# Patient Record
Sex: Female | Born: 1950 | Race: White | Hispanic: No | Marital: Married | Smoking: Never smoker
Health system: Southern US, Community
[De-identification: ages and names within clinical notes are randomized; demographics above are authoritative.]

## PROBLEM LIST (undated history)

## (undated) DIAGNOSIS — Z780 Asymptomatic menopausal state: Secondary | ICD-10-CM

## (undated) DIAGNOSIS — I10 Essential (primary) hypertension: Secondary | ICD-10-CM

## (undated) DIAGNOSIS — Z923 Personal history of irradiation: Secondary | ICD-10-CM

## (undated) DIAGNOSIS — I890 Lymphedema, not elsewhere classified: Secondary | ICD-10-CM

## (undated) DIAGNOSIS — R0600 Dyspnea, unspecified: Secondary | ICD-10-CM

## (undated) DIAGNOSIS — D6851 Activated protein C resistance: Secondary | ICD-10-CM

## (undated) DIAGNOSIS — R519 Headache, unspecified: Secondary | ICD-10-CM

## (undated) DIAGNOSIS — C50919 Malignant neoplasm of unspecified site of unspecified female breast: Secondary | ICD-10-CM

## (undated) DIAGNOSIS — H269 Unspecified cataract: Secondary | ICD-10-CM

## (undated) HISTORY — DX: Unspecified cataract: H26.9

## (undated) HISTORY — DX: Activated protein C resistance: D68.51

## (undated) HISTORY — DX: Essential (primary) hypertension: I10

## (undated) HISTORY — DX: Asymptomatic menopausal state: Z78.0

## (undated) HISTORY — DX: Malignant neoplasm of unspecified site of unspecified female breast: C50.919

---

## 1992-12-27 DIAGNOSIS — C50919 Malignant neoplasm of unspecified site of unspecified female breast: Secondary | ICD-10-CM

## 1992-12-27 HISTORY — DX: Malignant neoplasm of unspecified site of unspecified female breast: C50.919

## 1992-12-27 HISTORY — PX: BREAST LUMPECTOMY: SHX2

## 1998-11-06 ENCOUNTER — Other Ambulatory Visit: Admission: RE | Admit: 1998-11-06 | Discharge: 1998-11-06 | Payer: Self-pay | Admitting: *Deleted

## 1999-01-28 ENCOUNTER — Other Ambulatory Visit: Admission: RE | Admit: 1999-01-28 | Discharge: 1999-01-28 | Payer: Self-pay | Admitting: *Deleted

## 2000-07-12 ENCOUNTER — Other Ambulatory Visit: Admission: RE | Admit: 2000-07-12 | Discharge: 2000-07-12 | Payer: Self-pay | Admitting: *Deleted

## 2000-12-23 ENCOUNTER — Ambulatory Visit (HOSPITAL_COMMUNITY): Admission: RE | Admit: 2000-12-23 | Discharge: 2000-12-23 | Payer: Self-pay | Admitting: Surgery

## 2002-05-10 ENCOUNTER — Encounter: Payer: Self-pay | Admitting: Emergency Medicine

## 2002-05-10 ENCOUNTER — Emergency Department (HOSPITAL_COMMUNITY): Admission: EM | Admit: 2002-05-10 | Discharge: 2002-05-10 | Payer: Self-pay | Admitting: Emergency Medicine

## 2002-06-20 ENCOUNTER — Encounter (INDEPENDENT_AMBULATORY_CARE_PROVIDER_SITE_OTHER): Payer: Self-pay | Admitting: Specialist

## 2002-06-20 ENCOUNTER — Observation Stay (HOSPITAL_COMMUNITY): Admission: RE | Admit: 2002-06-20 | Discharge: 2002-06-20 | Payer: Self-pay | Admitting: Surgery

## 2002-06-20 ENCOUNTER — Encounter: Payer: Self-pay | Admitting: Surgery

## 2002-06-20 HISTORY — PX: CHOLECYSTECTOMY: SHX55

## 2004-08-10 ENCOUNTER — Encounter: Admission: RE | Admit: 2004-08-10 | Discharge: 2004-08-10 | Payer: Self-pay | Admitting: Family Medicine

## 2004-12-12 ENCOUNTER — Ambulatory Visit: Payer: Self-pay | Admitting: Family Medicine

## 2005-08-06 ENCOUNTER — Ambulatory Visit: Payer: Self-pay | Admitting: Family Medicine

## 2005-08-08 IMAGING — CR DG CHEST 2V
2 series · 2 of 2 positions shown · non-contrast
Comparison: none

CLINICAL DATA: Breast cancer. 
 TWO VIEW CHEST 
 Heart size and mediastinal contours are within normal limits.  Both lungs are clear.  Surgical clips are seen in the right axilla from previous axillary lymph node dissection.  Mild thoracic dextroscoliosis is also noted. 
 IMPRESSION
 No active cardiopulmonary disease.

[view not recorded (1 of 2)]
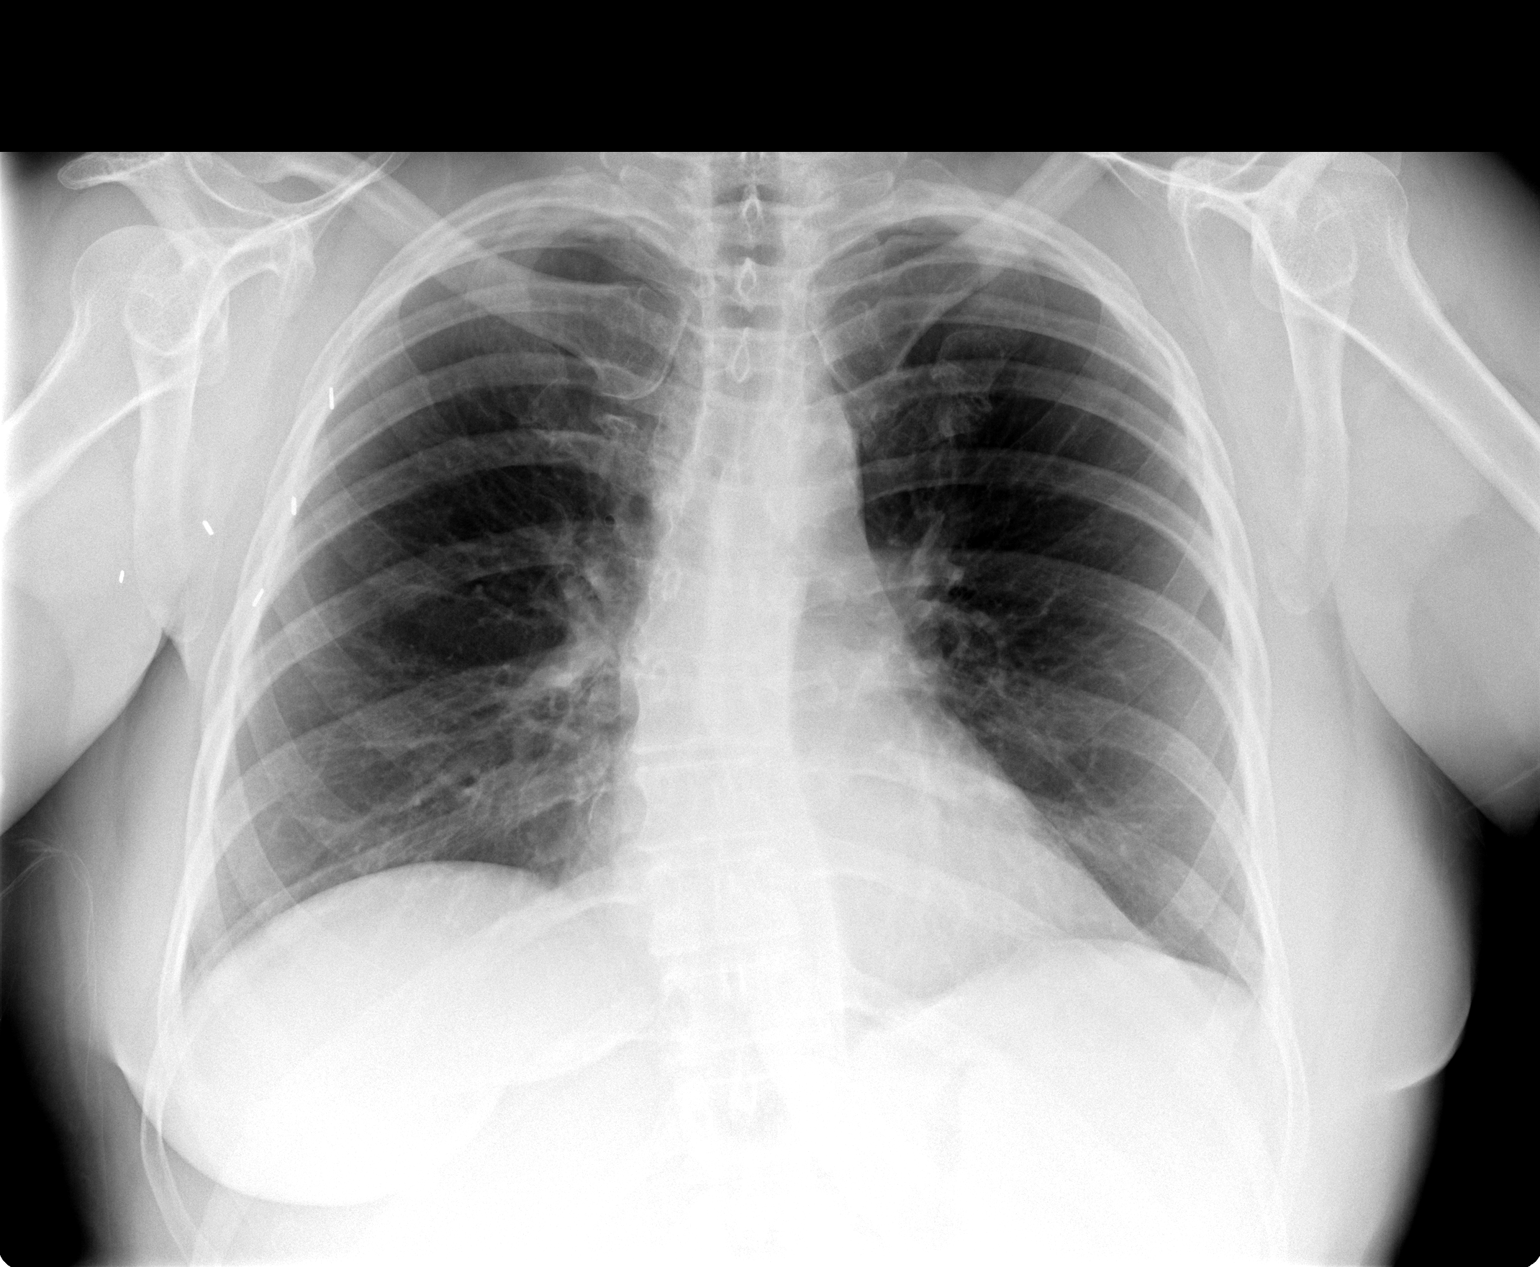

[view not recorded (2 of 2)]
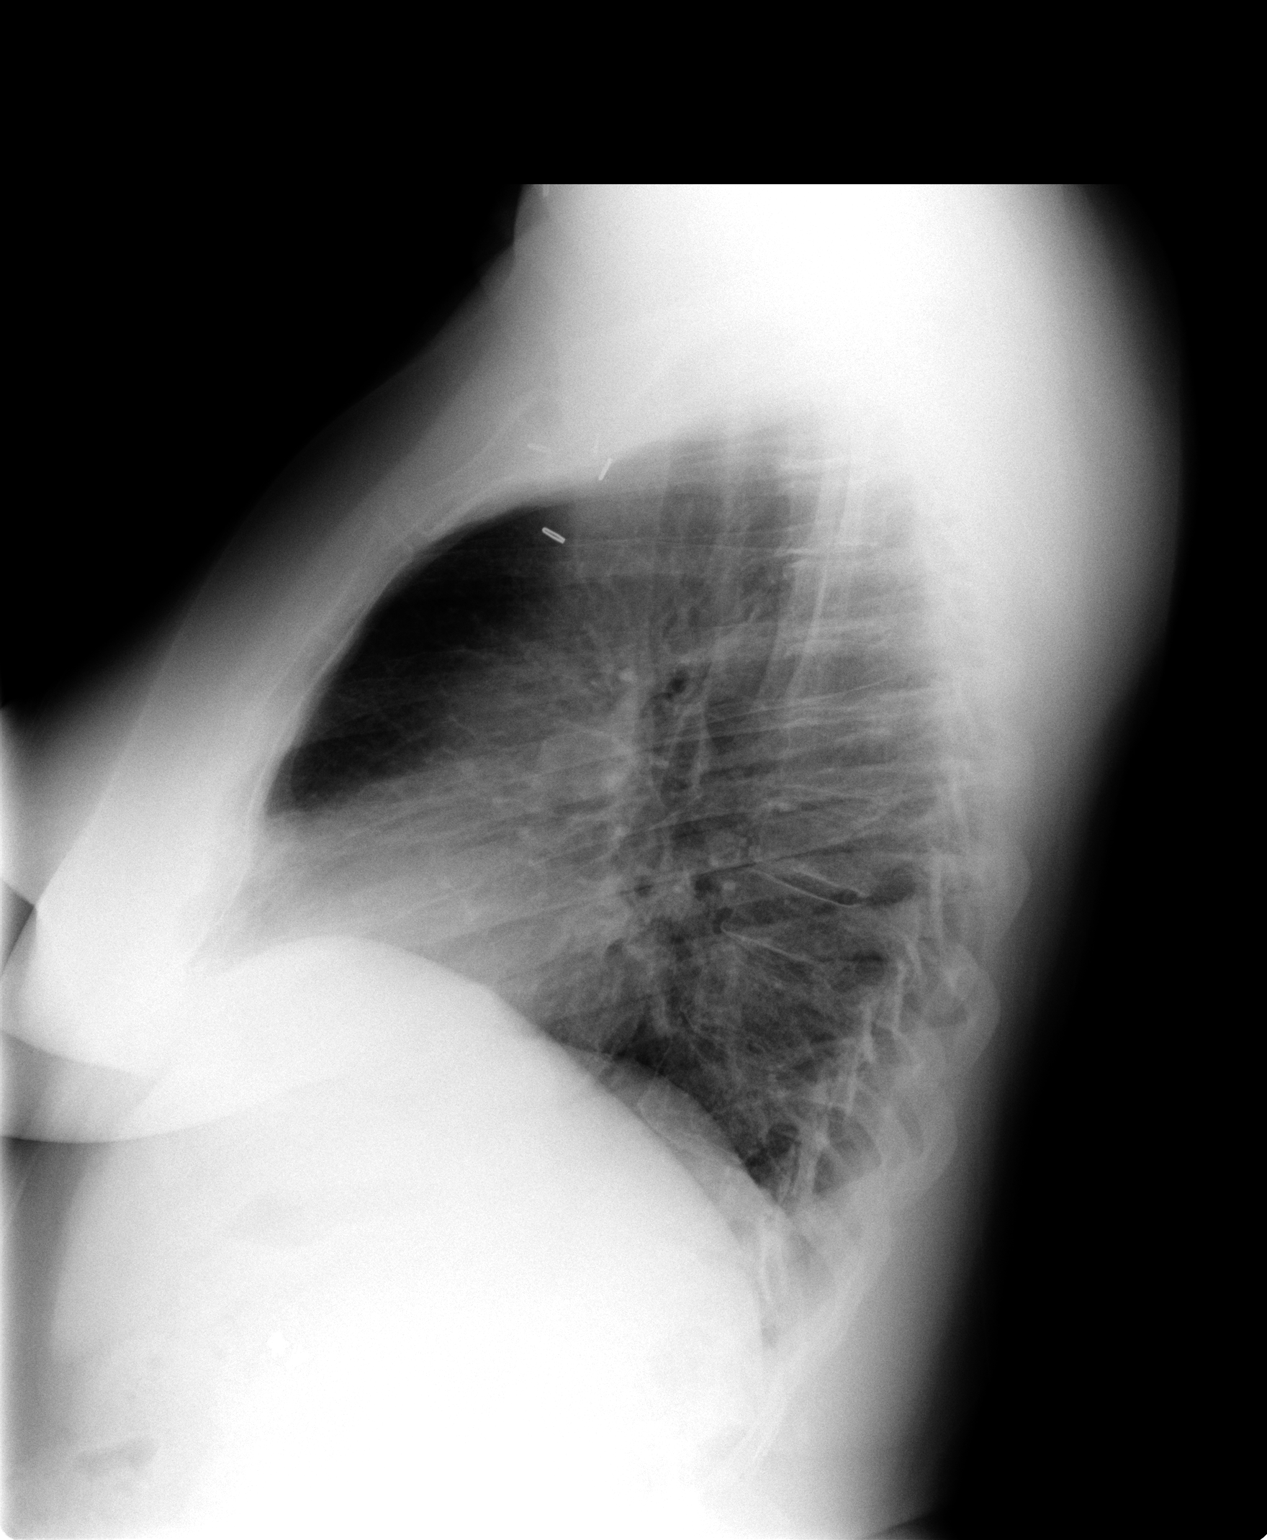

[2 of 2 positions shown; findings below may reference images not displayed]

## 2005-08-26 ENCOUNTER — Other Ambulatory Visit: Admission: RE | Admit: 2005-08-26 | Discharge: 2005-08-26 | Payer: Self-pay | Admitting: Family Medicine

## 2005-08-26 ENCOUNTER — Ambulatory Visit: Payer: Self-pay | Admitting: Family Medicine

## 2005-09-15 ENCOUNTER — Ambulatory Visit: Payer: Self-pay | Admitting: Family Medicine

## 2006-08-17 ENCOUNTER — Other Ambulatory Visit: Admission: RE | Admit: 2006-08-17 | Discharge: 2006-08-17 | Payer: Self-pay | Admitting: Family Medicine

## 2006-08-17 ENCOUNTER — Encounter: Payer: Self-pay | Admitting: Family Medicine

## 2006-08-17 ENCOUNTER — Ambulatory Visit: Payer: Self-pay | Admitting: Family Medicine

## 2007-02-02 ENCOUNTER — Ambulatory Visit (HOSPITAL_COMMUNITY): Admission: RE | Admit: 2007-02-02 | Discharge: 2007-02-02 | Payer: Self-pay | Admitting: Surgery

## 2007-09-14 ENCOUNTER — Other Ambulatory Visit: Admission: RE | Admit: 2007-09-14 | Discharge: 2007-09-14 | Payer: Self-pay | Admitting: Family Medicine

## 2007-09-14 ENCOUNTER — Encounter: Payer: Self-pay | Admitting: Family Medicine

## 2007-09-14 ENCOUNTER — Ambulatory Visit: Payer: Self-pay | Admitting: Family Medicine

## 2007-09-14 DIAGNOSIS — I10 Essential (primary) hypertension: Secondary | ICD-10-CM | POA: Insufficient documentation

## 2007-09-14 DIAGNOSIS — N951 Menopausal and female climacteric states: Secondary | ICD-10-CM

## 2007-09-14 DIAGNOSIS — Z853 Personal history of malignant neoplasm of breast: Secondary | ICD-10-CM | POA: Insufficient documentation

## 2007-09-14 DIAGNOSIS — R300 Dysuria: Secondary | ICD-10-CM | POA: Insufficient documentation

## 2007-09-15 ENCOUNTER — Encounter: Payer: Self-pay | Admitting: Family Medicine

## 2007-09-19 ENCOUNTER — Encounter (INDEPENDENT_AMBULATORY_CARE_PROVIDER_SITE_OTHER): Payer: Self-pay | Admitting: *Deleted

## 2007-11-01 ENCOUNTER — Telehealth (INDEPENDENT_AMBULATORY_CARE_PROVIDER_SITE_OTHER): Payer: Self-pay | Admitting: *Deleted

## 2007-12-11 ENCOUNTER — Encounter: Payer: Self-pay | Admitting: Family Medicine

## 2007-12-22 ENCOUNTER — Encounter: Payer: Self-pay | Admitting: Family Medicine

## 2008-02-02 ENCOUNTER — Encounter: Payer: Self-pay | Admitting: Family Medicine

## 2008-03-12 ENCOUNTER — Ambulatory Visit: Payer: Self-pay | Admitting: Family Medicine

## 2008-03-24 LAB — CONVERTED CEMR LAB
ALT: 28 units/L (ref 0–35)
AST: 27 units/L (ref 0–37)
Albumin: 3.6 g/dL (ref 3.5–5.2)
Alkaline Phosphatase: 57 units/L (ref 39–117)
Bilirubin, Direct: 0.1 mg/dL (ref 0.0–0.3)
Cholesterol: 183 mg/dL (ref 0–200)
HDL: 44.5 mg/dL (ref >39.0)
LDL Cholesterol: 119 mg/dL — ABNORMAL HIGH (ref 0–99)
Total Bilirubin: 0.6 mg/dL (ref 0.3–1.2)
Total CHOL/HDL Ratio: 4.1
Total Protein: 7 g/dL (ref 6.0–8.3)
Triglycerides: 99 mg/dL (ref 0–149)
VLDL: 20 mg/dL (ref 0–40)

## 2008-03-25 ENCOUNTER — Encounter (INDEPENDENT_AMBULATORY_CARE_PROVIDER_SITE_OTHER): Payer: Self-pay | Admitting: *Deleted

## 2008-05-02 ENCOUNTER — Ambulatory Visit: Payer: Self-pay | Admitting: Internal Medicine

## 2008-05-02 ENCOUNTER — Inpatient Hospital Stay (HOSPITAL_COMMUNITY): Admission: AD | Admit: 2008-05-02 | Discharge: 2008-05-04 | Payer: Self-pay | Admitting: Internal Medicine

## 2008-05-02 ENCOUNTER — Encounter: Payer: Self-pay | Admitting: Internal Medicine

## 2008-05-02 DIAGNOSIS — I890 Lymphedema, not elsewhere classified: Secondary | ICD-10-CM | POA: Insufficient documentation

## 2008-05-02 DIAGNOSIS — IMO0002 Reserved for concepts with insufficient information to code with codable children: Secondary | ICD-10-CM

## 2008-05-17 ENCOUNTER — Ambulatory Visit: Payer: Self-pay | Admitting: Internal Medicine

## 2008-05-17 DIAGNOSIS — E876 Hypokalemia: Secondary | ICD-10-CM

## 2008-05-21 ENCOUNTER — Ambulatory Visit: Payer: Self-pay | Admitting: Internal Medicine

## 2008-05-22 LAB — CONVERTED CEMR LAB
BUN: 9 mg/dL (ref 6–23)
Creatinine, Ser: 0.8 mg/dL (ref 0.4–1.2)
Potassium: 3.2 meq/L — ABNORMAL LOW (ref 3.5–5.1)

## 2008-05-23 ENCOUNTER — Encounter (INDEPENDENT_AMBULATORY_CARE_PROVIDER_SITE_OTHER): Payer: Self-pay | Admitting: *Deleted

## 2008-09-17 ENCOUNTER — Telehealth (INDEPENDENT_AMBULATORY_CARE_PROVIDER_SITE_OTHER): Payer: Self-pay | Admitting: *Deleted

## 2008-10-16 ENCOUNTER — Telehealth (INDEPENDENT_AMBULATORY_CARE_PROVIDER_SITE_OTHER): Payer: Self-pay | Admitting: *Deleted

## 2008-10-16 ENCOUNTER — Emergency Department (HOSPITAL_COMMUNITY): Admission: EM | Admit: 2008-10-16 | Discharge: 2008-10-16 | Payer: Self-pay | Admitting: Emergency Medicine

## 2008-10-17 ENCOUNTER — Encounter: Payer: Self-pay | Admitting: Family Medicine

## 2008-10-18 ENCOUNTER — Ambulatory Visit: Payer: Self-pay | Admitting: Family Medicine

## 2008-10-18 DIAGNOSIS — H8110 Benign paroxysmal vertigo, unspecified ear: Secondary | ICD-10-CM | POA: Insufficient documentation

## 2008-11-01 ENCOUNTER — Ambulatory Visit: Payer: Self-pay | Admitting: Family Medicine

## 2008-11-01 DIAGNOSIS — J019 Acute sinusitis, unspecified: Secondary | ICD-10-CM | POA: Insufficient documentation

## 2008-11-02 LAB — CONVERTED CEMR LAB
BUN: 7 mg/dL (ref 6–23)
Calcium: 9 mg/dL (ref 8.4–10.5)
GFR calc Af Amer: 95 mL/min
Glucose, Bld: 85 mg/dL (ref 70–99)

## 2008-11-04 ENCOUNTER — Encounter (INDEPENDENT_AMBULATORY_CARE_PROVIDER_SITE_OTHER): Payer: Self-pay | Admitting: *Deleted

## 2008-12-13 ENCOUNTER — Encounter: Payer: Self-pay | Admitting: Family Medicine

## 2008-12-26 ENCOUNTER — Other Ambulatory Visit: Admission: RE | Admit: 2008-12-26 | Discharge: 2008-12-26 | Payer: Self-pay | Admitting: Family Medicine

## 2008-12-26 ENCOUNTER — Encounter: Payer: Self-pay | Admitting: Family Medicine

## 2008-12-26 ENCOUNTER — Ambulatory Visit: Payer: Self-pay | Admitting: Family Medicine

## 2009-01-03 ENCOUNTER — Encounter (INDEPENDENT_AMBULATORY_CARE_PROVIDER_SITE_OTHER): Payer: Self-pay | Admitting: *Deleted

## 2009-01-06 ENCOUNTER — Encounter (INDEPENDENT_AMBULATORY_CARE_PROVIDER_SITE_OTHER): Payer: Self-pay | Admitting: *Deleted

## 2009-01-09 ENCOUNTER — Ambulatory Visit: Payer: Self-pay | Admitting: Family Medicine

## 2009-01-10 ENCOUNTER — Ambulatory Visit: Payer: Self-pay | Admitting: Family Medicine

## 2009-01-10 LAB — CONVERTED CEMR LAB: OCCULT 2: NEGATIVE

## 2009-01-13 ENCOUNTER — Encounter (INDEPENDENT_AMBULATORY_CARE_PROVIDER_SITE_OTHER): Payer: Self-pay | Admitting: *Deleted

## 2009-04-09 ENCOUNTER — Ambulatory Visit: Payer: Self-pay | Admitting: Family Medicine

## 2009-07-16 ENCOUNTER — Telehealth: Payer: Self-pay | Admitting: Family Medicine

## 2009-07-16 ENCOUNTER — Telehealth (INDEPENDENT_AMBULATORY_CARE_PROVIDER_SITE_OTHER): Payer: Self-pay | Admitting: *Deleted

## 2009-07-16 ENCOUNTER — Ambulatory Visit: Payer: Self-pay | Admitting: Family Medicine

## 2009-07-16 DIAGNOSIS — R0609 Other forms of dyspnea: Secondary | ICD-10-CM

## 2009-07-16 DIAGNOSIS — R079 Chest pain, unspecified: Secondary | ICD-10-CM

## 2009-07-16 DIAGNOSIS — R0989 Other specified symptoms and signs involving the circulatory and respiratory systems: Secondary | ICD-10-CM

## 2009-07-21 ENCOUNTER — Encounter (INDEPENDENT_AMBULATORY_CARE_PROVIDER_SITE_OTHER): Payer: Self-pay | Admitting: *Deleted

## 2009-07-22 ENCOUNTER — Telehealth (INDEPENDENT_AMBULATORY_CARE_PROVIDER_SITE_OTHER): Payer: Self-pay | Admitting: *Deleted

## 2009-07-23 ENCOUNTER — Ambulatory Visit: Payer: Self-pay

## 2009-07-23 ENCOUNTER — Encounter: Payer: Self-pay | Admitting: Cardiovascular Disease

## 2009-07-23 ENCOUNTER — Encounter: Payer: Self-pay | Admitting: Family Medicine

## 2009-07-25 ENCOUNTER — Telehealth (INDEPENDENT_AMBULATORY_CARE_PROVIDER_SITE_OTHER): Payer: Self-pay | Admitting: *Deleted

## 2009-07-28 ENCOUNTER — Encounter (INDEPENDENT_AMBULATORY_CARE_PROVIDER_SITE_OTHER): Payer: Self-pay | Admitting: *Deleted

## 2009-11-05 ENCOUNTER — Encounter: Payer: Self-pay | Admitting: Family Medicine

## 2009-12-15 ENCOUNTER — Encounter: Payer: Self-pay | Admitting: Family Medicine

## 2009-12-25 ENCOUNTER — Other Ambulatory Visit: Admission: RE | Admit: 2009-12-25 | Discharge: 2009-12-25 | Payer: Self-pay | Admitting: Family Medicine

## 2009-12-25 ENCOUNTER — Ambulatory Visit: Payer: Self-pay | Admitting: Family Medicine

## 2009-12-29 ENCOUNTER — Encounter (INDEPENDENT_AMBULATORY_CARE_PROVIDER_SITE_OTHER): Payer: Self-pay | Admitting: *Deleted

## 2010-01-15 ENCOUNTER — Ambulatory Visit: Payer: Self-pay | Admitting: Family Medicine

## 2010-01-15 ENCOUNTER — Encounter (INDEPENDENT_AMBULATORY_CARE_PROVIDER_SITE_OTHER): Payer: Self-pay | Admitting: *Deleted

## 2010-01-15 DIAGNOSIS — D235 Other benign neoplasm of skin of trunk: Secondary | ICD-10-CM | POA: Insufficient documentation

## 2010-01-15 DIAGNOSIS — L919 Hypertrophic disorder of the skin, unspecified: Secondary | ICD-10-CM

## 2010-01-15 DIAGNOSIS — L909 Atrophic disorder of skin, unspecified: Secondary | ICD-10-CM | POA: Insufficient documentation

## 2010-01-15 LAB — CONVERTED CEMR LAB
OCCULT 1: NEGATIVE
OCCULT 2: NEGATIVE
OCCULT 3: NEGATIVE

## 2010-01-19 ENCOUNTER — Telehealth (INDEPENDENT_AMBULATORY_CARE_PROVIDER_SITE_OTHER): Payer: Self-pay | Admitting: *Deleted

## 2010-01-22 ENCOUNTER — Ambulatory Visit: Payer: Self-pay | Admitting: Family Medicine

## 2010-07-17 ENCOUNTER — Ambulatory Visit: Payer: Self-pay | Admitting: Family Medicine

## 2010-07-17 DIAGNOSIS — E785 Hyperlipidemia, unspecified: Secondary | ICD-10-CM | POA: Insufficient documentation

## 2010-07-27 LAB — CONVERTED CEMR LAB
ALT: 24 units/L (ref 0–35)
AST: 24 units/L (ref 0–37)
Albumin: 3.7 g/dL (ref 3.5–5.2)
Alkaline Phosphatase: 55 units/L (ref 39–117)
BUN: 11 mg/dL (ref 6–23)
Bilirubin, Direct: 0.1 mg/dL (ref 0.0–0.3)
Calcium: 9 mg/dL (ref 8.4–10.5)
Chloride: 108 meq/L (ref 96–112)
Cholesterol: 204 mg/dL — ABNORMAL HIGH (ref 0–200)
Creatinine, Ser: 0.8 mg/dL (ref 0.4–1.2)
Direct LDL: 141.7 mg/dL
Total Protein: 6.8 g/dL (ref 6.0–8.3)
VLDL: 15.2 mg/dL (ref 0.0–40.0)

## 2010-10-19 ENCOUNTER — Ambulatory Visit: Payer: Self-pay | Admitting: Family Medicine

## 2010-10-21 LAB — CONVERTED CEMR LAB
BUN: 12 mg/dL (ref 6–23)
CO2: 27 meq/L (ref 19–32)
Chloride: 102 meq/L (ref 96–112)
Direct LDL: 135.3 mg/dL
Hgb A1c MFr Bld: 6.2 % (ref 4.6–6.5)
Potassium: 4.1 meq/L (ref 3.5–5.1)
VLDL: 19.4 mg/dL (ref 0.0–40.0)

## 2011-01-04 ENCOUNTER — Encounter: Payer: Self-pay | Admitting: Family Medicine

## 2011-01-24 LAB — CONVERTED CEMR LAB
ALT: 21 units/L (ref 0–35)
ALT: 29 units/L (ref 0–35)
AST: 24 units/L (ref 0–37)
AST: 31 units/L (ref 0–37)
Albumin: 3.7 g/dL (ref 3.5–5.2)
Albumin: 3.8 g/dL (ref 3.5–5.2)
Albumin: 3.8 g/dL (ref 3.5–5.2)
Albumin: 4 g/dL (ref 3.5–5.2)
Alkaline Phosphatase: 56 units/L (ref 39–117)
BUN: 12 mg/dL (ref 6–23)
BUN: 8 mg/dL (ref 6–23)
BUN: 8 mg/dL (ref 6–23)
BUN: 8 mg/dL (ref 6–23)
Basophils Absolute: 0 10*3/uL (ref 0.0–0.1)
Basophils Absolute: 0.1 10*3/uL (ref 0.0–0.1)
Basophils Relative: 0.1 % (ref 0.0–1.0)
Basophils Relative: 0.5 % (ref 0.0–3.0)
Bilirubin Urine: NEGATIVE
Bilirubin Urine: NEGATIVE
Bilirubin, Direct: 0.1 mg/dL (ref 0.0–0.3)
Blood in Urine, dipstick: NEGATIVE
Blood in Urine, dipstick: NEGATIVE
CO2: 27 meq/L (ref 19–32)
CO2: 29 meq/L (ref 19–32)
Calcium: 9 mg/dL (ref 8.4–10.5)
Calcium: 9.1 mg/dL (ref 8.4–10.5)
Calcium: 9.2 mg/dL (ref 8.4–10.5)
Chloride: 104 meq/L (ref 96–112)
Chloride: 104 meq/L (ref 96–112)
Cholesterol: 196 mg/dL (ref 0–200)
Cholesterol: 200 mg/dL (ref 0–200)
Cholesterol: 202 mg/dL (ref 0–200)
Creatinine, Ser: 0.8 mg/dL (ref 0.4–1.2)
Creatinine, Ser: 0.8 mg/dL (ref 0.4–1.2)
Creatinine, Ser: 1 mg/dL (ref 0.4–1.2)
Direct LDL: 145.9 mg/dL
Direct LDL: 146.7 mg/dL
Eosinophils Absolute: 0.1 10*3/uL (ref 0.0–0.6)
Eosinophils Absolute: 0.1 10*3/uL (ref 0.0–0.7)
Eosinophils Absolute: 0.1 10*3/uL (ref 0.0–0.7)
Eosinophils Relative: 1 % (ref 0.0–5.0)
Eosinophils Relative: 1.2 % (ref 0.0–5.0)
GFR calc Af Amer: 74 mL/min
GFR calc non Af Amer: 61 mL/min
GFR calc non Af Amer: 68.52 mL/min (ref >60)
GFR calc non Af Amer: 78.62 mL/min (ref >60)
GFR calc non Af Amer: 79 mL/min
Glucose, Bld: 87 mg/dL (ref 70–99)
Glucose, Bld: 95 mg/dL (ref 70–99)
Glucose, Bld: 97 mg/dL (ref 70–99)
Glucose, Bld: 97 mg/dL (ref 70–99)
Glucose, Urine, Semiquant: NEGATIVE
Glucose, Urine, Semiquant: NEGATIVE
HCT: 39.8 % (ref 36.0–46.0)
HCT: 40.8 % (ref 36.0–46.0)
HCT: 41 % (ref 36.0–46.0)
HDL: 51.6 mg/dL (ref >39.0)
HDL: 54 mg/dL (ref >39.00)
HDL: 55.6 mg/dL (ref >39.00)
Hemoglobin: 13.7 g/dL (ref 12.0–15.0)
Hemoglobin: 13.9 g/dL (ref 12.0–15.0)
Ketones, urine, test strip: NEGATIVE
LDL Cholesterol: 126 mg/dL — ABNORMAL HIGH (ref 0–99)
Lymphocytes Relative: 28.4 % (ref 12.0–46.0)
Lymphs Abs: 2.1 10*3/uL (ref 0.7–4.0)
MCHC: 34.1 g/dL (ref 30.0–36.0)
MCHC: 34.3 g/dL (ref 30.0–36.0)
MCV: 91.7 fL (ref 78.0–100.0)
MCV: 92.9 fL (ref 78.0–100.0)
Monocytes Absolute: 0.5 10*3/uL (ref 0.2–0.7)
Monocytes Absolute: 0.6 10*3/uL (ref 0.1–1.0)
Monocytes Relative: 7.1 % (ref 3.0–11.0)
Monocytes Relative: 8.5 % (ref 3.0–12.0)
Monocytes Relative: 9.2 % (ref 3.0–12.0)
Neutro Abs: 3.7 10*3/uL (ref 1.4–7.7)
Neutro Abs: 4.1 10*3/uL (ref 1.4–7.7)
Neutrophils Relative %: 60 % (ref 43.0–77.0)
Neutrophils Relative %: 63.4 % (ref 43.0–77.0)
Nitrite: NEGATIVE
Pap Smear: NORMAL
Platelets: 285 10*3/uL (ref 150.0–400.0)
Platelets: 300 10*3/uL (ref 150–400)
Potassium: 3.6 meq/L (ref 3.5–5.1)
Potassium: 3.9 meq/L (ref 3.5–5.1)
Protein, U semiquant: NEGATIVE
Protein, U semiquant: NEGATIVE
RBC: 4.34 M/uL (ref 3.87–5.11)
RBC: 4.42 M/uL (ref 3.87–5.11)
RDW: 13.6 % (ref 11.5–14.6)
RDW: 13.9 % (ref 11.5–14.6)
Sodium: 141 meq/L (ref 135–145)
Sodium: 142 meq/L (ref 135–145)
Specific Gravity, Urine: 1.015
Specific Gravity, Urine: >=1.03
TSH: 2.56 microintl units/mL (ref 0.35–5.50)
TSH: 3.2 microintl units/mL (ref 0.35–5.50)
Total Bilirubin: 0.6 mg/dL (ref 0.3–1.2)
Total Bilirubin: 0.6 mg/dL (ref 0.3–1.2)
Total Bilirubin: 0.7 mg/dL (ref 0.3–1.2)
Total CHOL/HDL Ratio: 3.9
Total Protein: 7 g/dL (ref 6.0–8.3)
Total Protein: 7.3 g/dL (ref 6.0–8.3)
Triglycerides: 91 mg/dL (ref 0–149)
Triglycerides: 94 mg/dL (ref 0.0–149.0)
Urobilinogen, UA: NEGATIVE
VLDL: 17.4 mg/dL (ref 0.0–40.0)
VLDL: 18 mg/dL (ref 0–40)
VLDL: 18.8 mg/dL (ref 0.0–40.0)
WBC: 6 10*3/uL (ref 4.5–10.5)
WBC: 6.5 10*3/uL (ref 4.5–10.5)
pH: 5
pH: 6

## 2011-01-26 ENCOUNTER — Encounter: Payer: Self-pay | Admitting: Family Medicine

## 2011-01-26 ENCOUNTER — Other Ambulatory Visit (HOSPITAL_COMMUNITY)
Admission: RE | Admit: 2011-01-26 | Discharge: 2011-01-26 | Disposition: A | Discharge status: Home / Self Care | Payer: BC Managed Care – PPO | Source: Ambulatory Visit | Attending: Family Medicine | Admitting: Family Medicine

## 2011-01-26 ENCOUNTER — Other Ambulatory Visit: Payer: Self-pay | Admitting: Family Medicine

## 2011-01-26 ENCOUNTER — Ambulatory Visit
Admission: RE | Admit: 2011-01-26 | Discharge: 2011-01-26 | Payer: Self-pay | Source: Home / Self Care | Attending: Family Medicine | Admitting: Family Medicine

## 2011-01-26 DIAGNOSIS — Z01419 Encounter for gynecological examination (general) (routine) without abnormal findings: Secondary | ICD-10-CM | POA: Insufficient documentation

## 2011-01-26 LAB — CONVERTED CEMR LAB
Blood in Urine, dipstick: NEGATIVE
Nitrite: NEGATIVE
Protein, U semiquant: NEGATIVE
Urobilinogen, UA: NEGATIVE
WBC Urine, dipstick: NEGATIVE

## 2011-01-26 LAB — CBC WITH DIFFERENTIAL/PLATELET
Basophils Absolute: 0 10*3/uL (ref 0.0–0.1)
Eosinophils Relative: 1.3 % (ref 0.0–5.0)
Hemoglobin: 13.7 g/dL (ref 12.0–15.0)
Lymphocytes Relative: 30.2 % (ref 12.0–46.0)
Monocytes Relative: 7.7 % (ref 3.0–12.0)
Neutro Abs: 3.7 10*3/uL (ref 1.4–7.7)
Platelets: 295 10*3/uL (ref 150.0–400.0)
RDW: 14.6 % (ref 11.5–14.6)
WBC: 6.1 10*3/uL (ref 4.5–10.5)

## 2011-01-26 LAB — LIPID PANEL
HDL: 50.9 mg/dL (ref >39.00)
LDL Cholesterol: 125 mg/dL — ABNORMAL HIGH (ref 0–99)
VLDL: 19 mg/dL (ref 0.0–40.0)

## 2011-01-26 LAB — BASIC METABOLIC PANEL
Calcium: 9.1 mg/dL (ref 8.4–10.5)
GFR: 60.44 mL/min (ref >60.00)
Glucose, Bld: 91 mg/dL (ref 70–99)
Sodium: 141 mEq/L (ref 135–145)

## 2011-01-26 LAB — HEPATIC FUNCTION PANEL
AST: 26 U/L (ref 0–37)
Alkaline Phosphatase: 53 U/L (ref 39–117)
Total Bilirubin: 0.2 mg/dL — ABNORMAL LOW (ref 0.3–1.2)

## 2011-01-26 NOTE — Letter (Signed)
Summary: Results Follow up Letter  [facility]  [address] [place], [place] [address]   Phone: [phone]  Fax: [phone]    12/29/2009 MRN: [id number]  NATAUSHA JUNGWIRTH [address] [place], [place]  [address]  Dear Ms. Carrithers,  The following are the results of your recent test(s):  Test         Result    Pap Smear:        Normal _____  Not Normal _____ Comments: ______________________________________________________ Cholesterol: LDL(Bad cholesterol):         Your goal is less than:         HDL (Good cholesterol):       Your goal is more than: Comments:  ______________________________________________________ Mammogram:        Normal _____  Not Normal _____ Comments:  ___________________________________________________________________ Hemoccult:        Normal _____  Not normal _______ Comments:    _____________________________________________________________________ Other Tests:  See attachment for results.  We routinely do not discuss normal results over the telephone.  If you desire a copy of the results, or you have any questions about this information we can discuss them at your next office visit.   Sincerely,    Army Fossa CMA  December 29, 2009 8:47 AM

## 2011-01-26 NOTE — Progress Notes (Signed)
Summary: Pathology Results   Phone Note Outgoing Call   Summary of Call: Called to inform pt that pathology report came back benign. left message with pts husband. Army Fossa CMA  January 19, 2010 2:48 PM   Follow-up for Phone Call        Patient aware and ok'd Follow-up by: Shonna Chock,  January 19, 2010 2:57 PM

## 2011-01-26 NOTE — Letter (Signed)
Summary: Results Follow up Letter  [facility]  [address] [place], [place] [address]   Phone: [phone]  Fax: [phone]    01/15/2010 MRN: [id number]  Tracy Thompson [address] [place], [place]  [address]  Dear [name],  The following are the results of your recent test(s):  Test         Result    Pap Smear:        Normal _____  Not Normal _____ Comments: ______________________________________________________ Cholesterol: LDL(Bad cholesterol):         Your goal is less than:         HDL (Good cholesterol):       Your goal is more than: Comments:  ______________________________________________________ Mammogram:        Normal _____  Not Normal _____ Comments:  ___________________________________________________________________ Hemoccult:        Normal __X___  Not normal _______ Comments:    _____________________________________________________________________ Other Tests:    We routinely do not discuss normal results over the telephone.  If you desire a copy of the results, or you have any questions about this information we can discuss them at your next office visit.   Sincerely,    Army Fossa CMA  January 15, 2010 4:22 PM

## 2011-01-26 NOTE — Assessment & Plan Note (Signed)
Summary: mole removal//ccm   Vital Signs:  Patient profile:   60 year old female Weight:      212 pounds Temp:     98.2 degrees F oral Pulse rate:   72 / minute Pulse rhythm:   regular  Vitals Entered By: Army Fossa CMA (January 15, 2010 10:01 AM) CC: remove 3 moles.    Allergies (verified): No Known Drug Allergies   Complete Medication List: 1)  Ziac 10-6.25 Mg Tabs (Bisoprolol-hydrochlorothiazide) .Marland Kitchen.. 1 by mouth once daily 2)  Evista 60 Mg Tabs (Raloxifene hcl) 3)  Adult Aspirin Low Strength 81 Mg Tbdp (Aspirin) .Marland Kitchen.. 1 by mouth once daily 4)  Meclizine Hcl 25 Mg Tabs (Meclizine hcl) .Marland Kitchen.. 1 by mouth qid as needed dizziness  Other Orders: Biopsy (Punch) Skin, Single Lesion (11100) Removal of Skin Tags up to 15 Lesions (11200)  Procedure Note Last Tetanus: Historical (07/02/2003)  Mole Biopsy/Removal: The patient complains of irritation and changing mole. Date of onset: 10/08/2009 Onset of lesion: 3 months Indication: changing lesion  Procedure # 1: punch biopsy    Size (in cm): 1.0 x 1.0    Region: anterior    Location: chest-upper-midline    Instrument used: 8mm punch    Anesthesia: 1% zylocaine with epi    Closure: simple interrupted    Superficial Suture: 3-0 Ethilon       # of superficial sutures: 3  Cleaned and prepped with: alcohol and betadine Wound dressing: bacitracin and bandaid Instructions: RTC in 7-10 days  Skin Tag Removal: The patient complains of pain, irritation, and inflammation. Date of onset: 10/01/2009 Indication: inflamed lesion  Procedure # 1: skin tag(s) removed    Region: lateral    Location: R axilla    # lesions removed: 2    Instrument used: scissors    Anesthesia: 1%zylocaine with epi    Comment: silver nitrate used to stop bleeding   Cleaned and prepped with: alcohol and betadine Wound dressing: bacitracin and bandaid Instructions: daily dressing changes

## 2011-01-26 NOTE — Assessment & Plan Note (Signed)
Summary: remover of stitches//ph   Vital Signs:  Patient profile:   60 year old female Weight:      213 pounds Pulse rate:   76 / minute Pulse rhythm:   regular BP sitting:   126 / 82  (left arm) Cuff size:   regular CC: remove stitches.   History of Present Illness: removed 3 stitches.   Allergies: No Known Drug Allergies   Complete Medication List: 1)  Ziac 10-6.25 Mg Tabs (Bisoprolol-hydrochlorothiazide) .Marland Kitchen.. 1 by mouth once daily 2)  Evista 60 Mg Tabs (Raloxifene hcl) 3)  Adult Aspirin Low Strength 81 Mg Tbdp (Aspirin) .Marland Kitchen.. 1 by mouth once daily 4)  Meclizine Hcl 25 Mg Tabs (Meclizine hcl) .Marland Kitchen.. 1 by mouth qid as needed dizziness  Other Orders: No Charge Patient Arrived (NCPA0) (NCPA0)

## 2011-01-28 LAB — CONVERTED CEMR LAB
HCV Ab: NEGATIVE
Hep A IgM: NEGATIVE

## 2011-02-03 NOTE — Assessment & Plan Note (Signed)
Summary: CPX AND PAP AND FASTING LABS///SPH   Vital Signs:  Patient profile:   60 year old female Height:      66 inches Weight:      199.6 pounds BMI:     32.33 Pulse rate:   62 / minute Pulse rhythm:   regular BP sitting:   128 / 76  (left arm) Cuff size:   large  Vitals Entered By: Almeta Monas CMA Duncan Dull) (January 26, 2011 9:02 AM) CC: CPX/Fasting with pap   History of Present Illness: Pt here for cpe and pap with labs.  Pt is concerned about Hepatitis---her schizophrenic brother has Hep C and her mom has Hep A.     Hyperlipidemia follow-up      This is a 60 year old woman who presents for Hyperlipidemia follow-up.  The patient denies muscle aches, GI upset, abdominal pain, flushing, itching, constipation, diarrhea, and fatigue.  The patient denies the following symptoms: chest pain/pressure, exercise intolerance, dypsnea, palpitations, syncope, and pedal edema.  Compliance with medications (by patient report) has been near 100%.  Dietary compliance has been good.  The patient reports exercising daily.  Adjunctive measures currently used by the patient include ASA and weight reduction.    Hypertension follow-up      The patient also presents for Hypertension follow-up.  The patient denies lightheadedness, urinary frequency, headaches, edema, impotence, rash, and fatigue.  The patient denies the following associated symptoms: chest pain, chest pressure, exercise intolerance, dyspnea, palpitations, syncope, leg edema, and pedal edema.  Compliance with medications (by patient report) has been near 100%.  The patient reports that dietary compliance has been good.  The patient reports exercising daily.  Adjunctive measures currently used by the patient include salt restriction.    Preventive Screening-Counseling & Management  Alcohol-Tobacco     Alcohol drinks/day: 0     Smoking Status: never     Passive Smoke Exposure: yes  Caffeine-Diet-Exercise     Caffeine use/day: 3     Does  Patient Exercise: no     Type of exercise: walking 3 miles     Exercise (avg: min/session):     Times/week: 7     Exercise Counseling: not indicated; exercise is adequate  Hep-HIV-STD-Contraception     HIV Risk: no     Dental Visit-last 6 months yes     Dental Care Counseling: not indicated; dental care within six months     SBE monthly: yes     SBE Education/Counseling: not indicated; SBE done regularly  Safety-Violence-Falls     Seat Belt Use: 100      Sexual History:  currently monogamous.    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) 6)  Chest Pain, Exertional  (ICD-786.50) 7)  Dyspnea On Exertion  (ICD-786.09) 8)  Preventive Health Care  (ICD-V70.0) 9)  Family History of Colon Ca 1st Degree Relative <60  (ICD-V16.0) 10)  Sinusitis- Acute-nos  (ICD-461.9) 11)  Benign Positional Vertigo  (ICD-386.11) 12)  Hypopotassemia  (ICD-276.8) 13)  Neoplasm, Malignant, Breast, Hx of  (ICD-V10.3) 14)  Lymphedema, Right Arm  (ICD-457.1) 15)  Cellulitis/abscess, Arm  (ICD-682.3) 16)  Hx, Personal, Malignancy, Breast  (ICD-V10.3) 17)  Preventive Health Care  (ICD-V70.0) 18)  Hypertension  (ICD-401.9) 19)  Postmenopausal Status  (  ICD-627.2) 20)  Symptom, Dysuria  (ICD-788.1)  Medications Prior to Update: 1)  Ziac 10-6.25 Mg Tabs (Bisoprolol-Hydrochlorothiazide) .Marland Kitchen.. 1 By Mouth Once Daily** Office Visit and Labs Due Now** 2)  Evista 60 Mg Tabs (Raloxifene Hcl) .... Take 1 Tab By Mouth Daily 3)  Adult Aspirin Low Strength 81 Mg  Tbdp (Aspirin) .Marland Kitchen.. 1 By Mouth Once Daily 4)  Meclizine Hcl 25 Mg Tabs (Meclizine Hcl) .Marland Kitchen.. 1 By Mouth Qid As Needed Dizziness  Current Medications (verified): 1)  Ziac 10-6.25 Mg Tabs (Bisoprolol-Hydrochlorothiazide) .Marland Kitchen.. 1 By Mouth Once Daily** Office Visit  and Labs Due Now** 2)  Evista 60 Mg Tabs (Raloxifene Hcl) .... Take 1 Tab By Mouth Daily 3)  Adult Aspirin Low Strength 81 Mg  Tbdp (Aspirin) .Marland Kitchen.. 1 By Mouth Once Daily 4)  Meclizine Hcl 25 Mg Tabs (Meclizine Hcl) .Marland Kitchen.. 1 By Mouth Qid As Needed Dizziness 5)  Vitamin D 1000 Unit Tabs (Cholecalciferol) 6)  Caltrate 600+d 600-400 Mg-Unit Tabs (Calcium Carbonate-Vitamin D)  Allergies (verified): No Known Drug Allergies  Past History:  Past Medical History: Last updated: 09/14/2007 Breast cancer, hx of (1994)s/p chemo 8/94-1/95,  radiation 8/94-11/94 chemo induced menopause  1994 Hypertension  Past Surgical History: Last updated: 09/14/2007 Cholecystectomy (06/20/2002) Lumpectomy  Family History: Last updated: 01/26/2011 M--bladder CA, CVA,colon CA : F HTN, prostate CA,DM; sib HTN,lipids Family History of Colon CA 1st degree relative <60 Family History High cholesterol Family History Hypertension Family History of Prostate CA 1st degree relative <50 Family History of Stroke F 1st degree relative <60 B-- schizophrenia Family History Liver disease  Social History: Last updated: 12/26/2008 no diet Married 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 M--bladder CA, CVA,colon CA : F HTN, prostate CA,DM; sib HTN,lipids Family History of Colon CA 1st degree relative <60 Family History High cholesterol Family History Hypertension Family History of Prostate CA 1st degree relative <50 Family History of Stroke F 1st degree relative <60 B-- schizophrenia Family History Liver disease  Social History: Reviewed history from 12/26/2008  and no changes required. 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 Eyes:  Denies blurring, discharge, double vision, eye irritation, eye pain, halos, itching, light sensitivity, red eye, vision loss-1 eye, and vision loss-both eyes; optho--q2y. ENT:  Denies decreased hearing, difficulty swallowing, ear discharge, earache, hoarseness, nasal congestion, nosebleeds, postnasal drainage, ringing in ears, sinus pressure, and sore throat. CV:  Denies bluish discoloration of lips or nails, chest pain or discomfort, difficulty breathing at night, difficulty breathing while lying down, fainting, fatigue, leg cramps with exertion, lightheadness, near fainting, palpitations, shortness of breath with exertion, swelling of feet, swelling of hands, and weight gain. Resp:  Denies chest discomfort, chest pain with inspiration, cough, coughing up blood, excessive snoring, hypersomnolence, morning headaches, pleuritic, shortness of breath, sputum productive, and wheezing. GI:  Denies abdominal pain, bloody stools, change in bowel habits, constipation, dark tarry stools, diarrhea, excessive appetite, gas, hemorrhoids, indigestion, loss of appetite, nausea, vomiting, vomiting blood, and yellowish skin color. GU:  Denies abnormal vaginal bleeding, decreased libido, discharge, dysuria, genital sores, hematuria, incontinence, nocturia, urinary frequency, and urinary hesitancy. MS:  Denies joint pain, joint redness, joint swelling, loss of strength, low back pain, mid back pain, muscle aches, muscle , cramps, muscle weakness, stiffness, and thoracic pain. Derm:  Denies changes in color of skin, changes in nail beds, dryness, excessive perspiration, flushing, hair loss, insect  bite(s), itching, lesion(s), poor wound healing, and rash. Neuro:  Denies brief paralysis, difficulty with concentration, disturbances in coordination, falling down, headaches, inability to speak, memory loss, numbness, poor balance, seizures, sensation of room spinning, tingling, tremors, visual disturbances, and weakness. Psych:  Denies alternate hallucination ( auditory/visual), anxiety, depression, easily angered, easily tearful, irritability, mental problems, panic attacks, sense of great danger, suicidal thoughts/plans, thoughts of violence, unusual visions or sounds, and thoughts /plans of harming others. Endo:  Denies cold intolerance, excessive hunger, excessive thirst, excessive urination, heat intolerance, polyuria, and weight change. Heme:  Denies abnormal bruising, bleeding, enlarge lymph nodes, fevers, pallor, and skin discoloration. Allergy:  Denies hives or rash, itching eyes, persistent infections, seasonal allergies, and sneezing.  Physical Exam  General:  Well-developed,well-nourished,in no acute distress; alert,appropriate and cooperative throughout examination Head:  Normocephalic and atraumatic without obvious abnormalities. No apparent alopecia or balding. Eyes:  pupils equal, pupils round, pupils reactive to light, and no injection.   Ears:  External ear exam shows no significant lesions or deformities.  Otoscopic examination reveals clear canals, tympanic membranes are intact bilaterally without bulging, retraction, inflammation or discharge. Hearing is grossly normal bilaterally. Nose:  External nasal examination shows no deformity or inflammation. Nasal mucosa are pink and moist without lesions or exudates. Mouth:  Oral mucosa and oropharynx without lesions or exudates.  Teeth in good repair. Neck:  No deformities, masses, or tenderness noted.no carotid bruits.   Chest Wall:  No deformities, masses, or tenderness noted. Breasts:  No mass, nodules, thickening, tenderness,  bulging, retraction, inflamation, nipple discharge or skin changes noted.   Lungs:  Normal respiratory effort, chest expands symmetrically. Lungs are clear to auscultation, no crackles or wheezes. Heart:  normal rate and no murmur.   Abdomen:  Bowel sounds positive,abdomen soft and non-tender without masses, organomegaly or hernias noted. Rectal:  No external abnormalities noted. Normal sphincter tone. No rectal masses or tenderness. Heme negative brown. Genitalia:  Pelvic Exam:        External: normal female genitalia without lesions or masses        Vagina: normal without lesions or masses        Cervix: normal without lesions or masses        Adnexa: normal bimanual exam without masses or fullness        Uterus: normal by palpation        Pap smear: performed Msk:  No deformity or scoliosis noted of thoracic or lumbar spine.   Pulses:  R posterior tibial normal, R dorsalis pedis normal, R carotid normal, L posterior tibial normal, L dorsalis pedis normal, and L carotid normal.   Extremities:  No clubbing, cyanosis, edema, or deformity noted with normal full range of motion of all joints.   Neurologic:  alert & oriented X3, strength normal in all extremities, and gait normal.   Skin:  Intact without suspicious lesions or rashes Cervical Nodes:  No lymphadenopathy noted Axillary Nodes:  No palpable lymphadenopathy Psych:  Cognition and judgment appear intact. Alert and cooperative with normal attention span and concentration. No apparent delusions, illusions, hallucinations   Impression &  Recommendations:  Problem # 1:  PREVENTIVE HEALTH CARE (ICD-V70.0)  Orders: Venipuncture ([address]109) TLB-Lipid Panel (80061-LIPID) TLB-BMP (Basic Metabolic Panel-BMET) (80048-METABOL) TLB-CBC Platelet - w/Differential (85025-CBCD) TLB-Hepatic/Liver Function Pnl (80076-HEPATIC) TLB-TSH (Thyroid Stimulating Hormone) (84443-TSH) T-Hepatitis Acute Panel (60454-09811) Specimen Handling (91478) UA Dipstick  W/ Micro (manual) (81000) EKG w/ Interpretation (93000)  Problem # 2:  HYPERLIPIDEMIA (ICD-272.4)  Orders: Venipuncture (29562) TLB-Lipid Panel (80061-LIPID) TLB-BMP (Basic Metabolic Panel-BMET) (80048-METABOL) TLB-CBC Platelet - w/Differential (85025-CBCD) TLB-Hepatic/Liver Function Pnl (80076-HEPATIC) TLB-TSH (Thyroid Stimulating Hormone) (84443-TSH) T-Hepatitis Acute Panel (13086-57846) Specimen Handling (96295) EKG w/ Interpretation (93000)  Labs Reviewed: SGOT: 24 (07/17/2010)   SGPT: 24 (07/17/2010)  Prior 10 Yr Risk Heart Disease: Not enough information (09/14/2007)   HDL:47.70 (10/19/2010), 51.20 (07/17/2010)  LDL:125 (12/25/2009), 126 (07/16/2009)  Chol:204 (10/19/2010), 204 (07/17/2010)  Trig:97.0 (10/19/2010), 76.0 (07/17/2010)  Problem # 3:  HYPERTENSION (ICD-401.9)  Her updated medication list for this problem includes:    Ziac 10-6.25 Mg Tabs (Bisoprolol-hydrochlorothiazide) .Marland Kitchen... 1 by mouth once daily** office visit and labs due now**  BP today: 128/76 Prior BP: 126/82 (01/22/2010)  Prior 10 Yr Risk Heart Disease: Not enough information (09/14/2007)  Labs Reviewed: K+: 4.1 (10/19/2010) Creat: : 0.9 (10/19/2010)   Chol: 204 (10/19/2010)   HDL: 47.70 (10/19/2010)   LDL: 125 (12/25/2009)   TG: 97.0 (10/19/2010)  Orders: Venipuncture (28413) TLB-Lipid Panel (80061-LIPID) TLB-BMP (Basic Metabolic Panel-BMET) (80048-METABOL) TLB-CBC Platelet - w/Differential (85025-CBCD) TLB-Hepatic/Liver Function Pnl (80076-HEPATIC) TLB-TSH (Thyroid Stimulating Hormone) (84443-TSH) T-Hepatitis Acute Panel (24401-02725) Specimen Handling (36644) EKG w/ Interpretation (93000)  Problem # 4:  POSTMENOPAUSAL STATUS (ICD-627.2)  Orders: EKG w/ Interpretation (93000)  Complete Medication List: 1)  Ziac 10-6.25 Mg Tabs (Bisoprolol-hydrochlorothiazide) .Marland Kitchen.. 1 by mouth once daily** office visit and labs due now** 2)  Evista 60 Mg Tabs (Raloxifene hcl) .... Take 1 tab by mouth  daily 3)  Adult Aspirin Low Strength 81 Mg Tbdp (Aspirin) .Marland Kitchen.. 1 by mouth once daily 4)  Meclizine Hcl 25 Mg Tabs (Meclizine hcl) .Marland Kitchen.. 1 by mouth qid as needed dizziness 5)  Vitamin D 1000 Unit Tabs (Cholecalciferol) 6)  Caltrate 600+d 600-400 Mg-unit Tabs (Calcium carbonate-vitamin d)  Other Orders: TwinRix 1ml ( Hep A&B Adult dose) (03474) Admin 1st Vaccine (25956)   Orders Added: 1)  Venipuncture [38756] 2)  TLB-Lipid Panel [80061-LIPID] 3)  TLB-BMP (Basic Metabolic Panel-BMET) [80048-METABOL] 4)  TLB-CBC Platelet - w/Differential [85025-CBCD] 5)  TLB-Hepatic/Liver Function Pnl [80076-HEPATIC] 6)  TLB-TSH (Thyroid Stimulating Hormone) [84443-TSH] 7)  T-Hepatitis Acute Panel [80074-22940] 8)  TwinRix 1ml ( Hep A&B Adult dose) [90636] 9)  Admin 1st Vaccine [90471] 10)  Specimen Handling [99000] 11)  UA Dipstick W/ Micro (manual) [81000] 12)  Est. Patient 40-64 years [99396] 13)  EKG w/ Interpretation [93000]   Immunizations Administered:  TwinRix # 1:    Vaccine Type: TwinRix    Site: left deltoid    Mfr: Merck    Dose: 0.5 ml    Route: IM    Given by: Almeta Monas CMA (AAMA)    Exp. Date: 10/02/2012    Lot #: [id number]    VIS given: 09/14/07 version given January 26, 2011.   Immunizations Administered:  TwinRix # 1:    Vaccine Type: TwinRix    Site: left deltoid    Mfr: Merck    Dose: 0.5 ml    Route: IM    Given by: Almeta Monas CMA (AAMA)    Exp. Date: 10/02/2012    Lot #: [id number]    VIS given:  09/14/07 version given January 26, 2011.  Last Flu Vaccine:  Fluvax 3+ (10/19/2010 8:29:48 AM) Flu Vaccine Result Date:  10/19/2010 Flu Vaccine Result:  given Flu Vaccine Next Due:  1 yr Last Mammogram:  normal (12/15/2009 10:12:37 AM) Mammogram Result Date:  01/12/2011 Mammogram Result:  normal Mammogram Next Due:  1 yr      Laboratory Results   Urine Tests   Date/Time Reported: January 26, 2011 11:12 AM   Routine Urinalysis   Color:  yellow Appearance: Clear Glucose: negative   (Normal Range: Negative) Bilirubin: negative   (Normal Range: Negative) Ketone: negative   (Normal Range: Negative) Spec. Gravity: 1.015   (Normal Range: 1.003-1.035) Blood: negative   (Normal Range: Negative) pH: 5.0   (Normal Range: 5.0-8.0) Protein: negative   (Normal Range: Negative) Urobilinogen: negative   (Normal Range: 0-1) Nitrite: negative   (Normal Range: Negative) Leukocyte Esterace: negative   (Normal Range: Negative)    Comments: Floydene Flock  January 26, 2011 11:13 AM

## 2011-03-29 ENCOUNTER — Ambulatory Visit: Payer: Self-pay

## 2011-03-30 ENCOUNTER — Ambulatory Visit: Payer: BC Managed Care – PPO | Admitting: *Deleted

## 2011-03-30 DIAGNOSIS — Z23 Encounter for immunization: Secondary | ICD-10-CM

## 2011-04-28 ENCOUNTER — Other Ambulatory Visit (INDEPENDENT_AMBULATORY_CARE_PROVIDER_SITE_OTHER): Payer: BC Managed Care – PPO

## 2011-04-28 DIAGNOSIS — Z0000 Encounter for general adult medical examination without abnormal findings: Secondary | ICD-10-CM

## 2011-04-28 DIAGNOSIS — E785 Hyperlipidemia, unspecified: Secondary | ICD-10-CM

## 2011-04-28 LAB — CBC WITH DIFFERENTIAL/PLATELET
Basophils Relative: 0.5 % (ref 0.0–3.0)
Eosinophils Relative: 1.6 % (ref 0.0–5.0)
HCT: 39.5 % (ref 36.0–46.0)
Hemoglobin: 13.2 g/dL (ref 12.0–15.0)
MCV: 93.9 fl (ref 78.0–100.0)
Monocytes Absolute: 0.5 10*3/uL (ref 0.1–1.0)
Neutro Abs: 3.1 10*3/uL (ref 1.4–7.7)
Neutrophils Relative %: 57.5 % (ref 43.0–77.0)
RBC: 4.2 Mil/uL (ref 3.87–5.11)
WBC: 5.5 10*3/uL (ref 4.5–10.5)

## 2011-04-28 LAB — LIPID PANEL
Cholesterol: 195 mg/dL (ref 0–200)
LDL Cholesterol: 134 mg/dL — ABNORMAL HIGH (ref 0–99)
Total CHOL/HDL Ratio: 4
VLDL: 14.2 mg/dL (ref 0.0–40.0)

## 2011-04-28 LAB — HEPATIC FUNCTION PANEL
Albumin: 3.5 g/dL (ref 3.5–5.2)
Bilirubin, Direct: 0.1 mg/dL (ref 0.0–0.3)
Total Protein: 6.4 g/dL (ref 6.0–8.3)

## 2011-04-28 LAB — BASIC METABOLIC PANEL
CO2: 27 mEq/L (ref 19–32)
Chloride: 104 mEq/L (ref 96–112)
Creatinine, Ser: 0.8 mg/dL (ref 0.4–1.2)
Potassium: 4 mEq/L (ref 3.5–5.1)
Sodium: 140 mEq/L (ref 135–145)

## 2011-05-11 NOTE — Discharge Summary (Signed)
Tracy Thompson, Tracy Thompson                ACCOUNT NO.:  [id number]   MEDICAL RECORD NO.:  [id number]          PATIENT TYPE:  INP   LOCATION:  6705                         FACILITY:  [facility]   PHYSICIAN:  Willow Ora, MD           DATE OF BIRTH:  [birth year]   DATE OF ADMISSION:  05/02/2008  DATE OF DISCHARGE:  05/04/2008                               DISCHARGE SUMMARY   BRIEF HISTORY AND PHYSICAL:  Ms. Tebbetts is a 60 year old white female  with a history of breast cancer, status post right lymph node dissection  and chronic right arm swelling, who presented to Dr. Frederik Pear office  with cellulitis in the right upper extremity.   PHYSICAL EXAMINATION:  Upon admission,  LUNGS:  Clear to auscultation bilaterally.  CARDIOVASCULAR:  Regular rate and rhythm without a murmur.  SKIN:  The right upper extremity is erythematous, two-thirds way up with  edema, worse than baseline.   LABORATORY AND X-RAYS:  Initial CBC showed a white count of 13.7 with a  hemoglobin of 13.7 and platelets of 294.  At the time of discharge, her  white count was 7.9.  Creatinine was 0.9, potassium was 3.2, but after  p.o. potassium supplements, it went up to 3.7.  Calcium was normal.  Blood cultures were not obtained.   HOSPITAL COURSE:  The patient was admitted to the hospital and started  on IV Rocephin.  Her hospital stay was unremarkable.  She has steadily  improved at the time of the discharge today.  The right upper extremity  is not red or warm.  The patient states that she feels and looks  completely back to normal.  She remains afebrile and the white count is  normal.  The patient and myself both feel that she is ready to go home.   DISCHARGE INSTRUCTIONS:  She will be discharged with the following  instructions:  1. Bisoprolol/hydrochlorothiazide 1 p.o. daily.  2. Evista 60 mg 1 p.o. daily.  3. Hydrochlorothiazide 25 mg as directed.  4. Aspirin 81 mg once a day.  5. Continue with routine calcium and other  supplements.  6. Keflex 500 mg 1 p.o. q.i.d. for 7 days.  7. I will also add doxycycline 100 mg 1 p.o. b.i.d. for 7 days, given      that she has chronic edema and is at high risk for complications.  8. She is recommended to see Dr. Alwyn Ren next week.  9. She is to keep the arm elevated and return to the ER if she noticed      that the symptoms and signs of cellulitis came back.     Willow Ora, MD  Electronically Signed    JP/MEDQ  D:  05/04/2008  T:  05/05/2008  Job:  [id number]   cc:   Titus Dubin. Alwyn Ren, MD,FACP,FCCP

## 2011-05-14 NOTE — Op Note (Signed)
NAMESHAKEA, ISIP                ACCOUNT NO.:  [id number]   MEDICAL RECORD NO.:  [id number]          PATIENT TYPE:  AMB   LOCATION:  ENDO                         FACILITY:  [facility]   PHYSICIAN:  Sandria Bales. Ezzard Standing, M.D.  DATE OF BIRTH:  Jul 02, 1951   DATE OF PROCEDURE:  DATE OF DISCHARGE:                               OPERATIVE REPORT   PREOPERATIVE DIAGNOSES:  History of breast cancer, family history of  colon cancer.   POSTOPERATIVE DIAGNOSIS:  Normal colonoscopy except for minimal sigmoid  colon diverticulosis.   PROCEDURE:  Flexible colonoscopy.   SURGEON:  Dr. Ezzard Standing.   ANESTHESIA:  75 mcg of Fentanyl, 7 mg of Versed.   COMPLICATIONS:  None.   INDICATIONS FOR PROCEDURE:  Ms. Probert is a 60 year old, white female  who had Stage II carcinoma of the breasts, treated with lumpectomy and  radiation therapy in July of 1994.  She has been disease-free since that  time and has done well.  Her mother has had colon cancer and has done  well from her colon cancer.   Deb now comes for screening colonoscopy.  Her last colonoscopy was in  December of 2001 and was negative.   OPERATIVE NOTE:  Deb completed a half GoLYTELY bowel prep at home.  She  was monitored with a pulse oximetry, EKG, blood pressure cuff and had  nasal O2 of 2 liters flowing during the procedure.   She was in the left lateral decubitus position.  A flexible Pentax  colonoscope was passed through her rectum, through her sigmoid colon,  around to her right colon.  Even though the scope advanced fairly easily  and I had a good lumen to look at, Deb had a lot of pain on advancing  the colonoscope.   I visualized the ileocecal valve, the right colon, the transverse colon,  the left colon, all unremarkable.  I saw a few diverticula in her  sigmoid colon consistent with very mild diverticulosis.  I saw maybe 3  or 4 pockets.  The scope was withdrawn into her rectum.  I saw no rectal  mass, lesion or nodule and digital  rectal exam was unremarkable.   This is felt to be a normal colonoscopy and would recommend repeat  colonoscopy in 5 years because of her family history of colon cancer and  her personal history of breast cancer.  If she should have any change in  bowel habits in the meantime, this may need to be done at an earlier  stage.      Sandria Bales. Ezzard Standing, M.D.  Electronically Signed     DHN/MEDQ  D:  02/02/2007  T:  02/02/2007  Job:  [id number]   cc:   Loreen Freud, M.D.  Valentino Hue. Magrinat, M.D.

## 2011-05-14 NOTE — Op Note (Signed)
[facility]  Patient:    Tracy Thompson, Tracy Thompson Visit Number: [id number] MRN: [id number]          Service Type: SUR Location: 4W 0460 01 Attending Physician:  Andre Lefort Dictated by:   Sandria Bales. Ezzard Standing, M.D. Proc. Date: 06/20/02 Admit Date:  06/20/2002 Discharge Date: 06/20/2002   CC:         Angelena Sole, M.D. [facility]  Darcella Cheshire, M.D.  Valentino Hue. Magrinat, M.D.   Operative Report  DATE OF BIRTH:  [birth year]  CCS NUMBER:  [id number]  PREOPERATIVE DIAGNOSIS:  Chronic cholecystitis with gallbladder sludge.  POSTOPERATIVE DIAGNOSIS:  Chronic cholecystitis.  PROCEDURE:  Laparoscopic cholecystectomy with intraoperative cholangiogram.  SURGEON:  Sandria Bales. Ezzard Standing, M.D.  FIRST ASSISTANT:  Gita Kudo, M.D.  ANESTHESIA:  General endotracheal.  ESTIMATED BLOOD LOSS:  Minimal.  INDICATIONS FOR PROCEDURE:  The patient is a 60 year old white female who is an old patient of mine, who had a T2N0 breast carcinoma treated with lumpectomy and radiation therapy in July of 1994.  She has been disease-free since that time.  She recently has shown up with recurrent epigastric abdominal pain which is localized to the right upper quadrant associated with food.  She had an ultrasound which showed gallbladder sludge and she bumped her liver enzymes, elevated at least on one lab test.  She now comes for attempt at laparoscopic cholecystectomy.  DESCRIPTION OF PROCEDURE:  The patient was placed in the supine position and given a general endotracheal anesthetic as supervised by Ninfa Meeker, M.D. She was given 1 g of Ancef at the initiation of the procedure.  She had PAS stockings in place.  Her abdomen was prepped with Betadine solution and sterilely draped.  An infraumbilical incision was made with sharp dissection carried down to the abdominal cavity.  A 12 mm Hasson trocar was inserted through the incision and secured with a 3-0 Vicryl suture and  a 0 degree 10 mm laparoscope inserted through the abdominal cavity.  Right and left lobes of the liver are unremarkable.  The anterior wall of the stomach was unremarkable.  The omentum which covered the bowel was unremarkable.  There was no mass or other lesion.  She did have a white thickened-appearing gallbladder consistent with chronic cholecystitis.  Three additional trocars were placed.  A 10 mm Ethicon trocar in the subxiphoid location, a 5 mm Ethicon trocar in the right subcostal location, and a 5 mm Ethicon trocar in the lateral subcostal location.  The gallbladder was identified, grasped, and rotated cephalad.  There was a fair amount of fat around the infundibulum of the gallbladder.  The gallbladder was dissected out, identifying the cystic duct and triangle of Calot.  She had a cystic artery coming up the anterior wall which was triply clipped and divided.  She had a single branch coming up the posterior wall which was Endoclipped and divided.  A clip was then placed on the gallbladder side of the cystic duct and the cystic duct cut.  An intraoperative cholangiogram was obtained with a cut off Taut catheter.  The Taut catheter was inserted through a 13-gauge Jelco catheter and inserted to the side of the cut cystic duct and then secured with an Endoclip.  Using half strength Hypaque solution was then injected into the cystic duct.  I used about 6-8 cc of the half strength Hypaque.  This showed a free flow of contrast from the cystic duct to the common bile duct  into the duodenum and back up the hepatic radicles.  There was no filling defect and no mass.  There was noted that the common bile duct was fairly small in size and caliber. This was felt to be a normal intraoperative cholangiogram.  The Taut catheter was then removed and the cystic duct triply Endoclipped and divided.  The gallbladder was then sharply and bluntly dissected from the gallbladder bed using primarily  hook and Bovie coagulation.  Prior to removal of the gallbladder from the gallbladder bed, the triangle of Calot and the gallbladder bed were visualized.  There was no bleeding and no bile leak from either surface, and the gallbladder was then delivered through the umbilicus and sent to pathology.  The abdomen was irrigated with about 500 cc of saline. Each trocar was removed and turned under direct visualization.  The umbilical trocar site was closed with a 0 Vicryl suture.  The skin at each site was closed with a 5-0 Vicryl suture, painted with a tincture of Benzoin and Steri-Strips.  The patient tolerated the procedure well and was transported to the recovery room in good condition.  Sponge and needle count were correct at the end of the case. Dictated by:   Sandria Bales. Ezzard Standing, M.D. Attending Physician:  Andre Lefort DD:  06/20/02 TD:  06/21/02 Job: [id number]

## 2011-06-07 ENCOUNTER — Other Ambulatory Visit: Payer: Self-pay | Admitting: Family Medicine

## 2011-06-09 NOTE — Telephone Encounter (Signed)
Faxed.   KP 

## 2011-07-28 ENCOUNTER — Other Ambulatory Visit: Payer: Self-pay | Admitting: Family Medicine

## 2011-07-28 DIAGNOSIS — E785 Hyperlipidemia, unspecified: Secondary | ICD-10-CM

## 2011-07-29 ENCOUNTER — Ambulatory Visit (INDEPENDENT_AMBULATORY_CARE_PROVIDER_SITE_OTHER): Payer: BC Managed Care – PPO | Admitting: *Deleted

## 2011-07-29 ENCOUNTER — Other Ambulatory Visit (INDEPENDENT_AMBULATORY_CARE_PROVIDER_SITE_OTHER): Payer: BC Managed Care – PPO

## 2011-07-29 DIAGNOSIS — E785 Hyperlipidemia, unspecified: Secondary | ICD-10-CM

## 2011-07-29 DIAGNOSIS — Z0000 Encounter for general adult medical examination without abnormal findings: Secondary | ICD-10-CM

## 2011-07-29 LAB — HEPATIC FUNCTION PANEL
ALT: 20 U/L (ref 0–35)
AST: 21 U/L (ref 0–37)
Albumin: 4.2 g/dL (ref 3.5–5.2)

## 2011-07-29 LAB — LIPID PANEL
HDL: 53.2 mg/dL (ref >39.00)
Triglycerides: 102 mg/dL (ref 0.0–149.0)

## 2011-07-29 NOTE — Progress Notes (Signed)
Labs only

## 2011-08-25 ENCOUNTER — Other Ambulatory Visit: Payer: Self-pay | Admitting: Family Medicine

## 2011-09-15 ENCOUNTER — Telehealth: Payer: Self-pay | Admitting: Family Medicine

## 2011-09-15 NOTE — Telephone Encounter (Signed)
Last seen 01/26/11 and nothing documented about Varicose Veins. Please advise    KP

## 2011-09-15 NOTE — Telephone Encounter (Signed)
Discussed with patient and scheduled appt    KP

## 2011-09-15 NOTE — Telephone Encounter (Signed)
Patient calling, states during her last OV with Dr. Laury Axon 01-26-2011, she states she discussed her Varicose Veins with Dr. Laury Axon & that she has since been to the clinic that Dr. Laury Axon recommend to her.  Patient was told that unless the diagnosis of Varicose Veins is documented in her records that her insurance will not pay for her treatment.  Please advise.

## 2011-09-15 NOTE — Telephone Encounter (Signed)
If she just asked for a vein dr and we did not do a referral --- and the reason for the visit was for that than I would not have anything in the chart.  She would need ov for that.

## 2011-09-20 ENCOUNTER — Ambulatory Visit (INDEPENDENT_AMBULATORY_CARE_PROVIDER_SITE_OTHER): Payer: BC Managed Care – PPO | Admitting: Family Medicine

## 2011-09-20 ENCOUNTER — Encounter: Payer: Self-pay | Admitting: Family Medicine

## 2011-09-20 DIAGNOSIS — I781 Nevus, non-neoplastic: Secondary | ICD-10-CM

## 2011-09-20 DIAGNOSIS — I8390 Asymptomatic varicose veins of unspecified lower extremity: Secondary | ICD-10-CM | POA: Insufficient documentation

## 2011-09-20 DIAGNOSIS — Z23 Encounter for immunization: Secondary | ICD-10-CM

## 2011-09-20 NOTE — Progress Notes (Signed)
Addended by: Arnette Norris on: 09/20/2011 01:49 PM   Modules accepted: Orders

## 2011-09-20 NOTE — Patient Instructions (Signed)
Varicose Veins Varicose veins are veins that have become enlarged and twisted. Valves in the veins help return blood from the leg to the heart. If these valves are damaged, blood flows backwards and backs up into the veins in the leg near the skin. This causes the veins to become larger because of increased pressure within. People who are on their feet a lot, who are pregnant, or who are overweight are more likely to develop varicose veins. HOME CARE INSTRUCTIONS  Do not stand or sit in one position for long periods of time. Do not sit with your legs crossed. Rest with your legs raised during the day.   Wear elastic stockings or support hose. Do not wear other tight encircling garments around legs, pelvis, or waist.   Walk as much as possible to increase blood flow.   Raise the foot of your bed at night with 2-inch blocks.   If you get a cut in the skin over the vein and the vein bleeds, lie down with your leg raised and press on it with a clean cloth until the bleeding stops. Then place a dressing on the cut. See a caregiver if it continues to bleed or needs stitches.  SEEK MEDICAL CARE IF:  The skin around your ankle starts to break down.   You have pain, redness, tenderness, or hard swelling developing in your leg over a vein.   You are uncomfortable with leg pain.  Document Released: 09/22/2005 Document Re-Released: 01/04/2010 [facility] Patient Information 2011 [place], [place].

## 2011-09-20 NOTE — Progress Notes (Signed)
  Subjective:     Tracy Thompson is a 60 y.o. female who was referred to me for evaluation of varicose veins. Symptoms include prominent veins on the bilateral, R worse than left spider veins on the bilateral, L eft worse than right pain is aggravated by upright posture. Symptoms have been ongoing for about several years. Symptoms have gradually worsened. Patient has not been evaluated for this previously.  Evaluation to date has included:  none. Treatment to date has included: prescription compression stockings: too early to assess effectiveness.  The following portions of the patient's history were reviewed and updated as appropriate: allergies, current medications, past family history, past medical history, past social history, past surgical history and problem list.  Review of Systems Pertinent items are noted in HPI.    Objective:    Physical Exam Varicose Locations:  knee, right greater than left, posterior  Spider Vein Locations:  ankle, left greater than right, lateral  Edema:  absent in the bilateral lower leg(s)  Tenderness:  Only with standing for long periods of time.  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 No clinical evidence of chronic venous insufficiency.    Plan:    1. Discussed the diagnosis with the patient. Prescription stockings Refer to Surgery because of the symptomatic nature of the problem. Pt already seen [facility]--  Stockings were ordered--next appointment Oct 11. 2. Written patient instruction given. 3. Follow up as needed for acute illness.

## 2011-09-22 LAB — CBC
HCT: 39.2
Hemoglobin: 13.2
RBC: 4.45
RDW: 15
WBC: 13.7 — ABNORMAL HIGH

## 2011-09-22 LAB — BASIC METABOLIC PANEL
CO2: 27
Calcium: 8.9
Creatinine, Ser: 0.9
GFR calc Af Amer: >60
GFR calc non Af Amer: >60
GFR calc non Af Amer: >60
Glucose, Bld: 104 — ABNORMAL HIGH
Potassium: 3.7
Sodium: 138

## 2011-09-22 LAB — DIFFERENTIAL
Basophils Relative: 0
Lymphs Abs: 1.5
Monocytes Relative: 6
Neutro Abs: 11.4 — ABNORMAL HIGH
Neutrophils Relative %: 83 — ABNORMAL HIGH

## 2011-09-27 LAB — POCT I-STAT, CHEM 8
HCT: 43
Hemoglobin: 14.6
Sodium: 143
TCO2: 28

## 2011-09-27 LAB — URINE MICROSCOPIC-ADD ON

## 2011-09-27 LAB — URINALYSIS, ROUTINE W REFLEX MICROSCOPIC
Bilirubin Urine: NEGATIVE
Glucose, UA: NEGATIVE
Hgb urine dipstick: NEGATIVE
Specific Gravity, Urine: 1.026
Urobilinogen, UA: 0.2

## 2011-12-28 HISTORY — PX: OTHER SURGICAL HISTORY: SHX169

## 2012-02-04 ENCOUNTER — Encounter: Payer: Self-pay | Admitting: Family Medicine

## 2012-02-07 ENCOUNTER — Encounter: Payer: Self-pay | Admitting: Family Medicine

## 2012-02-11 ENCOUNTER — Encounter: Payer: Self-pay | Admitting: Family Medicine

## 2012-02-11 ENCOUNTER — Other Ambulatory Visit (HOSPITAL_COMMUNITY)
Admission: RE | Admit: 2012-02-11 | Discharge: 2012-02-11 | Disposition: A | Discharge status: Home / Self Care | Payer: BC Managed Care – PPO | Source: Ambulatory Visit | Attending: Family Medicine | Admitting: Family Medicine

## 2012-02-11 ENCOUNTER — Ambulatory Visit (INDEPENDENT_AMBULATORY_CARE_PROVIDER_SITE_OTHER): Payer: BC Managed Care – PPO | Admitting: Family Medicine

## 2012-02-11 VITALS — BP 114/76 | HR 70 | Temp 98.4°F | Ht 66.0 in | Wt 205.4 lb

## 2012-02-11 DIAGNOSIS — Z124 Encounter for screening for malignant neoplasm of cervix: Secondary | ICD-10-CM

## 2012-02-11 DIAGNOSIS — Z01419 Encounter for gynecological examination (general) (routine) without abnormal findings: Secondary | ICD-10-CM | POA: Insufficient documentation

## 2012-02-11 DIAGNOSIS — Z0000 Encounter for general adult medical examination without abnormal findings: Secondary | ICD-10-CM

## 2012-02-11 DIAGNOSIS — E785 Hyperlipidemia, unspecified: Secondary | ICD-10-CM

## 2012-02-11 DIAGNOSIS — D6851 Activated protein C resistance: Secondary | ICD-10-CM

## 2012-02-11 DIAGNOSIS — I10 Essential (primary) hypertension: Secondary | ICD-10-CM

## 2012-02-11 DIAGNOSIS — D6859 Other primary thrombophilia: Secondary | ICD-10-CM

## 2012-02-11 LAB — CBC WITH DIFFERENTIAL/PLATELET
Basophils Relative: 0.4 % (ref 0.0–3.0)
Eosinophils Relative: 1.3 % (ref 0.0–5.0)
HCT: 42.9 % (ref 36.0–46.0)
Hemoglobin: 14.3 g/dL (ref 12.0–15.0)
Lymphs Abs: 1.8 10*3/uL (ref 0.7–4.0)
Monocytes Relative: 7.8 % (ref 3.0–12.0)
Neutro Abs: 3.6 10*3/uL (ref 1.4–7.7)
RBC: 4.63 Mil/uL (ref 3.87–5.11)
RDW: 14.6 % (ref 11.5–14.6)
WBC: 5.9 10*3/uL (ref 4.5–10.5)

## 2012-02-11 LAB — POCT URINALYSIS DIPSTICK
Bilirubin, UA: NEGATIVE
Glucose, UA: NEGATIVE
Ketones, UA: NEGATIVE
Leukocytes, UA: NEGATIVE
Nitrite, UA: NEGATIVE
Protein, UA: NEGATIVE
Spec Grav, UA: 1.02
Urobilinogen, UA: 0.2
pH, UA: 5

## 2012-02-11 LAB — BASIC METABOLIC PANEL WITH GFR
BUN: 13 mg/dL (ref 6–23)
CO2: 25 meq/L (ref 19–32)
Calcium: 9.2 mg/dL (ref 8.4–10.5)
Chloride: 104 meq/L (ref 96–112)
Creatinine, Ser: 0.8 mg/dL (ref 0.4–1.2)
GFR: 73.65 mL/min
Glucose, Bld: 88 mg/dL (ref 70–99)
Potassium: 3.8 meq/L (ref 3.5–5.1)
Sodium: 139 meq/L (ref 135–145)

## 2012-02-11 LAB — HEPATIC FUNCTION PANEL
ALT: 44 U/L — ABNORMAL HIGH (ref 0–35)
AST: 23 U/L (ref 0–37)
Albumin: 4.1 g/dL (ref 3.5–5.2)
Total Protein: 7.6 g/dL (ref 6.0–8.3)

## 2012-02-11 LAB — TSH: TSH: 1.47 u[IU]/mL (ref 0.35–5.50)

## 2012-02-11 LAB — LIPID PANEL: Cholesterol: 202 mg/dL — ABNORMAL HIGH (ref 0–200)

## 2012-02-11 LAB — LDL CHOLESTEROL, DIRECT: Direct LDL: 135.7 mg/dL

## 2012-02-11 NOTE — Assessment & Plan Note (Signed)
Check labs 

## 2012-02-11 NOTE — Assessment & Plan Note (Signed)
Stable con't med 

## 2012-02-11 NOTE — Patient Instructions (Signed)

## 2012-02-11 NOTE — Progress Notes (Signed)
Subjective:     Tracy Thompson is a 60 y.o. female and is here for a comprehensive physical exam. The patient reports no problems.  History   Social History  . Marital Status: Married    Spouse Name: N/A    Number of Children: N/A  . Years of Education: N/A   Occupational History  . 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 Smoking status: Never Smoker   . Smokeless tobacco: Never Used  . Alcohol Use: No  . Drug Use: No  . Sexually Active: Yes -- Female partner(s)   Other Topics Concern  . Not on file   Social History Narrative   Exercise--- walking , weights   Health Maintenance  Topic Date Due  . Colonoscopy  09/08/2002  . Influenza Vaccine  09/26/2012  . Tetanus/tdap  07/01/2013  . Mammogram  01/17/2014  . Pap Smear  01/26/2014    The following portions of the patient's history were reviewed and updated as appropriate: allergies, current medications, past family history, past medical history, past social history, past surgical history and problem list.  Review of Systems Review of Systems  Constitutional: Negative for activity change, appetite change and fatigue.  HENT: Negative for hearing loss, congestion, tinnitus and ear discharge.  dentist q52m Eyes: Negative for visual disturbance (see optho q2y -- vision corrected to 20/20 with glasses).  Respiratory: Negative for cough, chest tightness and shortness of breath.   Cardiovascular: Negative for chest pain, palpitations and leg swelling.  Gastrointestinal: Negative for abdominal pain, diarrhea, constipation and abdominal distention.  Genitourinary: Negative for urgency, frequency, decreased urine volume and difficulty urinating.  Musculoskeletal: Negative for back pain, arthralgias and gait problem.  Skin: Negative for color change, pallor and rash.  Neurological: Negative for dizziness, light-headedness, numbness and headaches.  Hematological: Negative for adenopathy. Does not bruise/bleed easily.    Psychiatric/Behavioral: Negative for suicidal ideas, confusion, sleep disturbance, self-injury, dysphoric mood, decreased concentration and agitation.      Objective:    BP 114/76  Pulse 70  Temp(Src) 98.4 F (36.9 C) (Oral)  Ht 5\' 6"  (1.676 m)  Wt 205 lb 6.4 oz (93.169 kg)  BMI 33.15 kg/m2  SpO2 95% General appearance: alert, cooperative, appears stated age and no distress Head: Normocephalic, without obvious abnormality, atraumatic Eyes: conjunctivae/corneas clear. PERRL, EOM's intact. Fundi benign. Ears: normal TM's and external ear canals both ears Nose: Nares normal. Septum midline. Mucosa normal. No drainage or sinus tenderness. Throat: lips, mucosa, and tongue normal; teeth and gums normal Neck: no adenopathy, no carotid bruit, no JVD, supple, symmetrical, trachea midline and thyroid not enlarged, symmetric, no tenderness/mass/nodules Back: symmetric, no curvature. ROM normal. No CVA tenderness. Lungs: clear to auscultation bilaterally Breasts: normal appearance, no masses or tenderness Heart: regular rate and rhythm, S1, S2 normal, no murmur, click, rub or gallop Abdomen: soft, non-tender; bowel sounds normal; no masses,  no organomegaly Pelvic: cervix normal in appearance, external genitalia normal, no adnexal masses or tenderness, no cervical motion tenderness, rectovaginal septum normal, uterus normal size, shape, and consistency and vagina normal without discharge Extremities: extremities normal, atraumatic, no cyanosis or edema Pulses: 2+ and symmetric Skin: Skin color, texture, turgor normal. No rashes or lesions Lymph nodes: Cervical, supraclavicular, and axillary nodes normal. Neurologic: Alert and oriented X 3, normal strength and tone. Normal symmetric reflexes. Normal coordination and gait Psych--  No depression or anxiety   Assessment:    Healthy female exam.      Plan:  ghm utd Check labs See After Visit Summary for Counseling Recommendations

## 2012-02-16 MED ORDER — SIMVASTATIN 20 MG PO TABS: 20.0000 mg | ORAL_TABLET | Freq: Every evening | ORAL | Status: DC

## 2012-02-21 ENCOUNTER — Other Ambulatory Visit: Payer: Self-pay | Admitting: Family Medicine

## 2012-05-08 ENCOUNTER — Other Ambulatory Visit: Payer: Self-pay | Admitting: Family Medicine

## 2012-05-19 ENCOUNTER — Other Ambulatory Visit (INDEPENDENT_AMBULATORY_CARE_PROVIDER_SITE_OTHER): Payer: BC Managed Care – PPO

## 2012-05-19 DIAGNOSIS — E785 Hyperlipidemia, unspecified: Secondary | ICD-10-CM

## 2012-05-19 LAB — LIPID PANEL
Total CHOL/HDL Ratio: 3
Triglycerides: 66 mg/dL (ref 0.0–149.0)

## 2012-05-19 LAB — HEPATIC FUNCTION PANEL
AST: 22 U/L (ref 0–37)
Alkaline Phosphatase: 47 U/L (ref 39–117)
Bilirubin, Direct: 0 mg/dL (ref 0.0–0.3)
Total Bilirubin: 0.3 mg/dL (ref 0.3–1.2)

## 2012-05-19 NOTE — Progress Notes (Signed)
Labs only

## 2012-05-22 ENCOUNTER — Other Ambulatory Visit: Payer: Self-pay | Admitting: Family Medicine

## 2012-05-24 ENCOUNTER — Other Ambulatory Visit: Payer: Self-pay | Admitting: Family Medicine

## 2012-05-24 DIAGNOSIS — E785 Hyperlipidemia, unspecified: Secondary | ICD-10-CM

## 2012-08-20 ENCOUNTER — Other Ambulatory Visit: Payer: Self-pay | Admitting: Family Medicine

## 2012-10-17 ENCOUNTER — Ambulatory Visit (INDEPENDENT_AMBULATORY_CARE_PROVIDER_SITE_OTHER): Payer: BC Managed Care – PPO

## 2012-10-17 DIAGNOSIS — Z23 Encounter for immunization: Secondary | ICD-10-CM

## 2012-10-30 ENCOUNTER — Ambulatory Visit: Payer: BC Managed Care – PPO

## 2012-10-31 ENCOUNTER — Ambulatory Visit (INDEPENDENT_AMBULATORY_CARE_PROVIDER_SITE_OTHER): Payer: BC Managed Care – PPO

## 2012-10-31 DIAGNOSIS — Z2911 Encounter for prophylactic immunotherapy for respiratory syncytial virus (RSV): Secondary | ICD-10-CM

## 2012-10-31 DIAGNOSIS — Z299 Encounter for prophylactic measures, unspecified: Secondary | ICD-10-CM

## 2012-11-28 ENCOUNTER — Other Ambulatory Visit: Payer: Self-pay | Admitting: Family Medicine

## 2012-11-28 NOTE — Telephone Encounter (Signed)
Rx sent.    MW 

## 2012-12-01 ENCOUNTER — Other Ambulatory Visit (INDEPENDENT_AMBULATORY_CARE_PROVIDER_SITE_OTHER): Payer: BC Managed Care – PPO

## 2012-12-01 DIAGNOSIS — E785 Hyperlipidemia, unspecified: Secondary | ICD-10-CM

## 2012-12-01 LAB — LIPID PANEL: Cholesterol: 128 mg/dL (ref 0–200)

## 2012-12-01 LAB — HEPATIC FUNCTION PANEL
ALT: 17 U/L (ref 0–35)
AST: 19 U/L (ref 0–37)
Bilirubin, Direct: 0 mg/dL (ref 0.0–0.3)
Total Bilirubin: 0.3 mg/dL (ref 0.3–1.2)
Total Protein: 6.8 g/dL (ref 6.0–8.3)

## 2012-12-28 ENCOUNTER — Telehealth: Payer: Self-pay

## 2012-12-28 MED ORDER — HYDROCHLOROTHIAZIDE 12.5 MG PO CAPS: ORAL_CAPSULE | ORAL | Status: DC

## 2012-12-28 MED ORDER — NEBIVOLOL HCL 10 MG PO TABS: 10.0000 mg | ORAL_TABLET | Freq: Every day | ORAL | Status: DC

## 2012-12-28 NOTE — Telephone Encounter (Signed)
Incoming fax from [facility] advising the Ziac is unavailable until sometime in Feb. The patient was given the last 19 pills and will need an alternative. Per Dr.Lowne give the patient Bystolic samples and an Rx for HCTZ 12/5 1/2 tab daily and recheck BP in 2-3 weeks. I discussed with the patient and she agreed. She picked up meds today so she has 19 pills left, she will come in to pick up the samples next week and will start once she has completed the Ziac. She has a CPE scheduled 03/02/12 and will recheck BP at that time. Samples left at check in and pharmacy has been made aware.      KP

## 2013-03-02 ENCOUNTER — Encounter: Payer: BC Managed Care – PPO | Admitting: Family Medicine

## 2013-03-04 ENCOUNTER — Other Ambulatory Visit: Payer: Self-pay | Admitting: Family Medicine

## 2013-03-12 ENCOUNTER — Telehealth: Payer: Self-pay | Admitting: Family Medicine

## 2013-03-12 MED ORDER — BISOPROLOL-HYDROCHLOROTHIAZIDE 10-6.25 MG PO TABS: ORAL_TABLET | ORAL | Status: DC

## 2013-03-12 NOTE — Telephone Encounter (Signed)
Refill: Bisoprolol-hctz 10-6.25mg . Take 1 tablet by mouth once daily. Qty 30. Last fill 02-12-13

## 2013-04-03 ENCOUNTER — Other Ambulatory Visit: Payer: Self-pay | Admitting: Family Medicine

## 2013-04-16 ENCOUNTER — Encounter: Payer: Self-pay | Admitting: Family Medicine

## 2013-04-16 ENCOUNTER — Ambulatory Visit (INDEPENDENT_AMBULATORY_CARE_PROVIDER_SITE_OTHER): Payer: BC Managed Care – PPO | Admitting: Family Medicine

## 2013-04-16 VITALS — BP 126/78 | HR 65 | Temp 98.5°F | Ht 66.25 in | Wt 211.8 lb

## 2013-04-16 DIAGNOSIS — I10 Essential (primary) hypertension: Secondary | ICD-10-CM

## 2013-04-16 DIAGNOSIS — K648 Other hemorrhoids: Secondary | ICD-10-CM

## 2013-04-16 DIAGNOSIS — Z23 Encounter for immunization: Secondary | ICD-10-CM

## 2013-04-16 DIAGNOSIS — Z0000 Encounter for general adult medical examination without abnormal findings: Secondary | ICD-10-CM

## 2013-04-16 DIAGNOSIS — Z780 Asymptomatic menopausal state: Secondary | ICD-10-CM

## 2013-04-16 DIAGNOSIS — E785 Hyperlipidemia, unspecified: Secondary | ICD-10-CM

## 2013-04-16 DIAGNOSIS — R609 Edema, unspecified: Secondary | ICD-10-CM

## 2013-04-16 DIAGNOSIS — K5730 Diverticulosis of large intestine without perforation or abscess without bleeding: Secondary | ICD-10-CM

## 2013-04-16 MED ORDER — SIMVASTATIN 20 MG PO TABS: ORAL_TABLET | ORAL | Status: DC

## 2013-04-16 MED ORDER — BISOPROLOL-HYDROCHLOROTHIAZIDE 10-6.25 MG PO TABS: ORAL_TABLET | ORAL | Status: DC

## 2013-04-16 MED ORDER — HYDROCHLOROTHIAZIDE 12.5 MG PO TABS: 12.5000 mg | ORAL_TABLET | Freq: Every day | ORAL | Status: DC

## 2013-04-16 MED ORDER — RALOXIFENE HCL 60 MG PO TABS: ORAL_TABLET | ORAL | Status: DC

## 2013-04-16 NOTE — Patient Instructions (Addendum)
Preventive Care for Adults, Female A healthy lifestyle and preventive care can promote health and wellness. Preventive health guidelines for women include the following key practices.  A routine yearly physical is a good way to check with your caregiver about your health and preventive screening. It is a chance to share any concerns and updates on your health, and to receive a thorough exam.  Visit your dentist for a routine exam and preventive care every 6 months. Brush your teeth twice a day and floss once a day. Good oral hygiene prevents tooth decay and gum disease.  The frequency of eye exams is based on your age, health, family medical history, use of contact lenses, and other factors. Follow your caregiver's recommendations for frequency of eye exams.  Eat a healthy diet. Foods like vegetables, fruits, whole grains, low-fat dairy products, and lean protein foods contain the nutrients you need without too many calories. Decrease your intake of foods high in solid fats, added sugars, and salt. Eat the right amount of calories for you.Get information about a proper diet from your caregiver, if necessary.  Regular physical exercise is one of the most important things you can do for your health. Most adults should get at least 150 minutes of moderate-intensity exercise (any activity that increases your heart rate and causes you to sweat) each week. In addition, most adults need muscle-strengthening exercises on 2 or more days a week.  Maintain a healthy weight. The body mass index (BMI) is a screening tool to identify possible weight problems. It provides an estimate of body fat based on height and weight. Your caregiver can help determine your BMI, and can help you achieve or maintain a healthy weight.For adults 20 years and older:  A BMI below 18.5 is considered underweight.  A BMI of 18.5 to 24.9 is normal.  A BMI of 25 to 29.9 is considered overweight.  A BMI of 30 and above is  considered obese.  Maintain normal blood lipids and cholesterol levels by exercising and minimizing your intake of saturated fat. Eat a balanced diet with plenty of fruit and vegetables. Blood tests for lipids and cholesterol should begin at age 20 and be repeated every 5 years. If your lipid or cholesterol levels are high, you are over 50, or you are at high risk for heart disease, you may need your cholesterol levels checked more frequently.Ongoing high lipid and cholesterol levels should be treated with medicines if diet and exercise are not effective.  If you smoke, find out from your caregiver how to quit. If you do not use tobacco, do not start.  If you are pregnant, do not drink alcohol. If you are breastfeeding, be very cautious about drinking alcohol. If you are not pregnant and choose to drink alcohol, do not exceed 1 drink per day. One drink is considered to be 12 ounces (355 mL) of beer, 5 ounces (148 mL) of wine, or 1.5 ounces (44 mL) of liquor.  Avoid use of street drugs. Do not share needles with anyone. Ask for help if you need support or instructions about stopping the use of drugs.  High blood pressure causes heart disease and increases the risk of stroke. Your blood pressure should be checked at least every 1 to 2 years. Ongoing high blood pressure should be treated with medicines if weight loss and exercise are not effective.  If you are 55 to 61 years old, ask your caregiver if you should take aspirin to prevent strokes.  Diabetes   screening involves taking a blood sample to check your fasting blood sugar level. This should be done once every 3 years, after age 45, if you are within normal weight and without risk factors for diabetes. Testing should be considered at a younger age or be carried out more frequently if you are overweight and have at least 1 risk factor for diabetes.  Breast cancer screening is essential preventive care for women. You should practice "breast  self-awareness." This means understanding the normal appearance and feel of your breasts and may include breast self-examination. Any changes detected, no matter how small, should be reported to a caregiver. Women in their 20s and 30s should have a clinical breast exam (CBE) by a caregiver as part of a regular health exam every 1 to 3 years. After age 40, women should have a CBE every year. Starting at age 40, women should consider having a mammography (breast X-ray test) every year. Women who have a family history of breast cancer should talk to their caregiver about genetic screening. Women at a high risk of breast cancer should talk to their caregivers about having magnetic resonance imaging (MRI) and a mammography every year.  The Pap test is a screening test for cervical cancer. A Pap test can show cell changes on the cervix that might become cervical cancer if left untreated. A Pap test is a procedure in which cells are obtained and examined from the lower end of the uterus (cervix).  Women should have a Pap test starting at age 21.  Between ages 21 and 29, Pap tests should be repeated every 2 years.  Beginning at age 30, you should have a Pap test every 3 years as long as the past 3 Pap tests have been normal.  Some women have medical problems that increase the chance of getting cervical cancer. Talk to your caregiver about these problems. It is especially important to talk to your caregiver if a new problem develops soon after your last Pap test. In these cases, your caregiver may recommend more frequent screening and Pap tests.  The above recommendations are the same for women who have or have not gotten the vaccine for human papillomavirus (HPV).  If you had a hysterectomy for a problem that was not cancer or a condition that could lead to cancer, then you no longer need Pap tests. Even if you no longer need a Pap test, a regular exam is a good idea to make sure no other problems are  starting.  If you are between ages 65 and 70, and you have had normal Pap tests going back 10 years, you no longer need Pap tests. Even if you no longer need a Pap test, a regular exam is a good idea to make sure no other problems are starting.  If you have had past treatment for cervical cancer or a condition that could lead to cancer, you need Pap tests and screening for cancer for at least 20 years after your treatment.  If Pap tests have been discontinued, risk factors (such as a new sexual partner) need to be reassessed to determine if screening should be resumed.  The HPV test is an additional test that may be used for cervical cancer screening. The HPV test looks for the virus that can cause the cell changes on the cervix. The cells collected during the Pap test can be tested for HPV. The HPV test could be used to screen women aged 30 years and older, and should   be used in women of any age who have unclear Pap test results. After the age of 30, women should have HPV testing at the same frequency as a Pap test.  Colorectal cancer can be detected and often prevented. Most routine colorectal cancer screening begins at the age of 50 and continues through age 75. However, your caregiver may recommend screening at an earlier age if you have risk factors for colon cancer. On a yearly basis, your caregiver may provide home test kits to check for hidden blood in the stool. Use of a small camera at the end of a tube, to directly examine the colon (sigmoidoscopy or colonoscopy), can detect the earliest forms of colorectal cancer. Talk to your caregiver about this at age 50, when routine screening begins. Direct examination of the colon should be repeated every 5 to 10 years through age 75, unless early forms of pre-cancerous polyps or small growths are found.  Hepatitis C blood testing is recommended for all people born from 1945 through 1965 and any individual with known risks for hepatitis C.  Practice  safe sex. Use condoms and avoid high-risk sexual practices to reduce the spread of sexually transmitted infections (STIs). STIs include gonorrhea, chlamydia, syphilis, trichomonas, herpes, HPV, and human immunodeficiency virus (HIV). Herpes, HIV, and HPV are viral illnesses that have no cure. They can result in disability, cancer, and death. Sexually active women aged 25 and younger should be checked for chlamydia. Older women with new or multiple partners should also be tested for chlamydia. Testing for other STIs is recommended if you are sexually active and at increased risk.  Osteoporosis is a disease in which the bones lose minerals and strength with aging. This can result in serious bone fractures. The risk of osteoporosis can be identified using a bone density scan. Women ages 65 and over and women at risk for fractures or osteoporosis should discuss screening with their caregivers. Ask your caregiver whether you should take a calcium supplement or vitamin D to reduce the rate of osteoporosis.  Menopause can be associated with physical symptoms and risks. Hormone replacement therapy is available to decrease symptoms and risks. You should talk to your caregiver about whether hormone replacement therapy is right for you.  Use sunscreen with sun protection factor (SPF) of 30 or more. Apply sunscreen liberally and repeatedly throughout the day. You should seek shade when your shadow is shorter than you. Protect yourself by wearing long sleeves, pants, a wide-brimmed hat, and sunglasses year round, whenever you are outdoors.  Once a month, do a whole body skin exam, using a mirror to look at the skin on your back. Notify your caregiver of new moles, moles that have irregular borders, moles that are larger than a pencil eraser, or moles that have changed in shape or color.  Stay current with required immunizations.  Influenza. You need a dose every fall (or winter). The composition of the flu vaccine  changes each year, so being vaccinated once is not enough.  Pneumococcal polysaccharide. You need 1 to 2 doses if you smoke cigarettes or if you have certain chronic medical conditions. You need 1 dose at age 65 (or older) if you have never been vaccinated.  Tetanus, diphtheria, pertussis (Tdap, Td). Get 1 dose of Tdap vaccine if you are younger than age 65, are over 65 and have contact with an infant, are a healthcare worker, are pregnant, or simply want to be protected from whooping cough. After that, you need a Td   booster dose every 10 years. Consult your caregiver if you have not had at least 3 tetanus and diphtheria-containing shots sometime in your life or have a deep or dirty wound.  HPV. You need this vaccine if you are a woman age 26 or younger. The vaccine is given in 3 doses over 6 months.  Measles, mumps, rubella (MMR). You need at least 1 dose of MMR if you were born in 1957 or later. You may also need a second dose.  Meningococcal. If you are age 19 to 21 and a first-year college student living in a residence hall, or have one of several medical conditions, you need to get vaccinated against meningococcal disease. You may also need additional booster doses.  Zoster (shingles). If you are age 60 or older, you should get this vaccine.  Varicella (chickenpox). If you have never had chickenpox or you were vaccinated but received only 1 dose, talk to your caregiver to find out if you need this vaccine.  Hepatitis A. You need this vaccine if you have a specific risk factor for hepatitis A virus infection or you simply wish to be protected from this disease. The vaccine is usually given as 2 doses, 6 to 18 months apart.  Hepatitis B. You need this vaccine if you have a specific risk factor for hepatitis B virus infection or you simply wish to be protected from this disease. The vaccine is given in 3 doses, usually over 6 months. Preventive Services / Frequency Ages 19 to 39  Blood  pressure check.** / Every 1 to 2 years.  Lipid and cholesterol check.** / Every 5 years beginning at age 20.  Clinical breast exam.** / Every 3 years for women in their 20s and 30s.  Pap test.** / Every 2 years from ages 21 through 29. Every 3 years starting at age 30 through age 65 or 70 with a history of 3 consecutive normal Pap tests.  HPV screening.** / Every 3 years from ages 30 through ages 65 to 70 with a history of 3 consecutive normal Pap tests.  Hepatitis C blood test.** / For any individual with known risks for hepatitis C.  Skin self-exam. / Monthly.  Influenza immunization.** / Every year.  Pneumococcal polysaccharide immunization.** / 1 to 2 doses if you smoke cigarettes or if you have certain chronic medical conditions.  Tetanus, diphtheria, pertussis (Tdap, Td) immunization. / A one-time dose of Tdap vaccine. After that, you need a Td booster dose every 10 years.  HPV immunization. / 3 doses over 6 months, if you are 26 and younger.  Measles, mumps, rubella (MMR) immunization. / You need at least 1 dose of MMR if you were born in 1957 or later. You may also need a second dose.  Meningococcal immunization. / 1 dose if you are age 19 to 21 and a first-year college student living in a residence hall, or have one of several medical conditions, you need to get vaccinated against meningococcal disease. You may also need additional booster doses.  Varicella immunization.** / Consult your caregiver.  Hepatitis A immunization.** / Consult your caregiver. 2 doses, 6 to 18 months apart.  Hepatitis B immunization.** / Consult your caregiver. 3 doses usually over 6 months. Ages 40 to 64  Blood pressure check.** / Every 1 to 2 years.  Lipid and cholesterol check.** / Every 5 years beginning at age 20.  Clinical breast exam.** / Every year after age 40.  Mammogram.** / Every year beginning at age 40   and continuing for as long as you are in good health. Consult with your  caregiver.  Pap test.** / Every 3 years starting at age 30 through age 65 or 70 with a history of 3 consecutive normal Pap tests.  HPV screening.** / Every 3 years from ages 30 through ages 65 to 70 with a history of 3 consecutive normal Pap tests.  Fecal occult blood test (FOBT) of stool. / Every year beginning at age 50 and continuing until age 75. You may not need to do this test if you get a colonoscopy every 10 years.  Flexible sigmoidoscopy or colonoscopy.** / Every 5 years for a flexible sigmoidoscopy or every 10 years for a colonoscopy beginning at age 50 and continuing until age 75.  Hepatitis C blood test.** / For all people born from 1945 through 1965 and any individual with known risks for hepatitis C.  Skin self-exam. / Monthly.  Influenza immunization.** / Every year.  Pneumococcal polysaccharide immunization.** / 1 to 2 doses if you smoke cigarettes or if you have certain chronic medical conditions.  Tetanus, diphtheria, pertussis (Tdap, Td) immunization.** / A one-time dose of Tdap vaccine. After that, you need a Td booster dose every 10 years.  Measles, mumps, rubella (MMR) immunization. / You need at least 1 dose of MMR if you were born in 1957 or later. You may also need a second dose.  Varicella immunization.** / Consult your caregiver.  Meningococcal immunization.** / Consult your caregiver.  Hepatitis A immunization.** / Consult your caregiver. 2 doses, 6 to 18 months apart.  Hepatitis B immunization.** / Consult your caregiver. 3 doses, usually over 6 months. Ages 65 and over  Blood pressure check.** / Every 1 to 2 years.  Lipid and cholesterol check.** / Every 5 years beginning at age 20.  Clinical breast exam.** / Every year after age 40.  Mammogram.** / Every year beginning at age 40 and continuing for as long as you are in good health. Consult with your caregiver.  Pap test.** / Every 3 years starting at age 30 through age 65 or 70 with a 3  consecutive normal Pap tests. Testing can be stopped between 65 and 70 with 3 consecutive normal Pap tests and no abnormal Pap or HPV tests in the past 10 years.  HPV screening.** / Every 3 years from ages 30 through ages 65 or 70 with a history of 3 consecutive normal Pap tests. Testing can be stopped between 65 and 70 with 3 consecutive normal Pap tests and no abnormal Pap or HPV tests in the past 10 years.  Fecal occult blood test (FOBT) of stool. / Every year beginning at age 50 and continuing until age 75. You may not need to do this test if you get a colonoscopy every 10 years.  Flexible sigmoidoscopy or colonoscopy.** / Every 5 years for a flexible sigmoidoscopy or every 10 years for a colonoscopy beginning at age 50 and continuing until age 75.  Hepatitis C blood test.** / For all people born from 1945 through 1965 and any individual with known risks for hepatitis C.  Osteoporosis screening.** / A one-time screening for women ages 65 and over and women at risk for fractures or osteoporosis.  Skin self-exam. / Monthly.  Influenza immunization.** / Every year.  Pneumococcal polysaccharide immunization.** / 1 dose at age 65 (or older) if you have never been vaccinated.  Tetanus, diphtheria, pertussis (Tdap, Td) immunization. / A one-time dose of Tdap vaccine if you are over   65 and have contact with an infant, are a healthcare worker, or simply want to be protected from whooping cough. After that, you need a Td booster dose every 10 years.  Varicella immunization.** / Consult your caregiver.  Meningococcal immunization.** / Consult your caregiver.  Hepatitis A immunization.** / Consult your caregiver. 2 doses, 6 to 18 months apart.  Hepatitis B immunization.** / Check with your caregiver. 3 doses, usually over 6 months. ** Family history and personal history of risk and conditions may change your caregiver's recommendations. Document Released: 02/08/2002 Document Revised: 03/06/2012  Document Reviewed: 05/10/2011 [employer] Patient Information 2013 [employer].  

## 2013-04-16 NOTE — Progress Notes (Signed)
Subjective:     Tracy Thompson is a 62 y.o. female and is here for a comprehensive physical exam. The patient reports no problems.  History   Social History  . Marital Status: Married    Spouse Name: N/A    Number of Children: N/A  . Years of Education: N/A   Occupational History  . 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 Smoking status: Never Smoker   . Smokeless tobacco: Never Used  . Alcohol Use: No  . Drug Use: No  . Sexually Active: Yes -- Female partner(s)   Other Topics Concern  . Not on file   Social History Narrative   Exercise--- walking , weights   Health Maintenance  Topic Date Due  . Influenza Vaccine  08/27/2013  . Mammogram  01/22/2015  . Pap Smear  02/10/2015  . Colonoscopy  07/25/2022  . Tetanus/tdap  04/17/2023  . Zostavax  Completed    The following portions of the patient's history were reviewed and updated as appropriate:  She  has a past medical history of Breast cancer (1994); Menopause; Hypertension; and Factor 5 Leiden mutation, heterozygous. She  does not have any pertinent problems on file. She  has past surgical history that includes Cholecystectomy (06/20/2002); Breast lumpectomy; and varicose veins. Her family history includes COPD in her brother; Cancer in her mother; Colon cancer in her mother; Deep vein thrombosis in her father; Diabetes in her father; Hepatitis in her mother; Hepatitis C in her brother; Hyperlipidemia in her mother; Hypertension in her father and mother; Liver disease in an unspecified family member; Mental illness in her brother; Prostate cancer in her father; Pulmonary embolism in her father; Schizophrenia in her brother; and Stroke in her father and mother. She  reports that she has never smoked. She has never used smokeless tobacco. She reports that she does not drink alcohol or use illicit drugs. She has a current medication list which includes the following prescription(s): aspirin,  bisoprolol-hydrochlorothiazide, cholecalciferol, meclizine, raloxifene, simvastatin, calcium-vitamin d, and hydrochlorothiazide. Current Outpatient Prescriptions on File Prior to Visit  Medication Sig Dispense Refill  . aspirin 81 MG tablet Take 81 mg by mouth daily.        . cholecalciferol (VITAMIN D) 1000 UNITS tablet Take 1,000 Units by mouth daily.        . meclizine (ANTIVERT) 25 MG tablet Take 25 mg by mouth 3 (three) times daily as needed.        . calcium-vitamin D (OSCAL WITH D) 500-200 MG-UNIT per tablet Take 1 tablet by mouth daily.         No current facility-administered medications on file prior to visit.   She has No Known Allergies..  Review of Systems Review of Systems  Constitutional: Negative for activity change, appetite change and fatigue.  HENT: Negative for hearing loss, congestion, tinnitus and ear discharge.  dentist q38m Eyes: Negative for visual disturbance (see optho q1y -- vision corrected to 20/20 with glasses).  Respiratory: Negative for cough, chest tightness and shortness of breath.   Cardiovascular: Negative for chest pain, palpitations and leg swelling.  Gastrointestinal: Negative for abdominal pain, diarrhea, constipation and abdominal distention.  Genitourinary: Negative for urgency, frequency, decreased urine volume and difficulty urinating.  Musculoskeletal: Negative for back pain, arthralgias and gait problem.  Skin: Negative for color change, pallor and rash.  Neurological: Negative for dizziness, light-headedness, numbness and headaches.  Hematological: Negative for adenopathy. Does not bruise/bleed easily.  Psychiatric/Behavioral: Negative for suicidal  ideas, confusion, sleep disturbance, self-injury, dysphoric mood, decreased concentration and agitation.      Objective:    BP 126/78  Pulse 65  Temp(Src) 98.5 F (36.9 C) (Oral)  Ht 5' 6.25" (1.683 m)  Wt 211 lb 12.8 oz (96.072 kg)  BMI 33.92 kg/m2  SpO2 97% General appearance: alert,  cooperative, appears stated age and no distress Head: Normocephalic, without obvious abnormality, atraumatic Eyes: conjunctivae/corneas clear. PERRL, EOM's intact. Fundi benign. Ears: normal TM's and external ear canals both ears Nose: Nares normal. Septum midline. Mucosa normal. No drainage or sinus tenderness. Throat: lips, mucosa, and tongue normal; teeth and gums normal Neck: no adenopathy, no carotid bruit, no JVD, supple, symmetrical, trachea midline and thyroid not enlarged, symmetric, no tenderness/mass/nodules Back: symmetric, no curvature. ROM normal. No CVA tenderness. Lungs: clear to auscultation bilaterally Breasts: normal appearance, no masses or tenderness Heart: regular rate and rhythm, S1, S2 normal, no murmur, click, rub or gallop Abdomen: soft, non-tender; bowel sounds normal; no masses,  no organomegaly Pelvic: deferred Extremities: extremities normal, atraumatic, no cyanosis or edema Pulses: 2+ and symmetric Skin: Skin color, texture, turgor normal. No rashes or lesions Lymph nodes: Cervical, supraclavicular, and axillary nodes normal. Neurologic: Alert and oriented X 3, normal strength and tone. Normal symmetric reflexes. Normal coordination and gait Psych-- no depression, no anxety      Assessment:    Healthy female exam.      Plan:    ghm utd Check labs   See After Visit Summary for Counseling Recommendations

## 2013-04-16 NOTE — Assessment & Plan Note (Signed)
Stable con't meds 

## 2013-04-16 NOTE — Assessment & Plan Note (Signed)
Check labs con't meds 

## 2013-06-18 ENCOUNTER — Other Ambulatory Visit: Payer: BC Managed Care – PPO

## 2013-06-20 ENCOUNTER — Other Ambulatory Visit (INDEPENDENT_AMBULATORY_CARE_PROVIDER_SITE_OTHER): Payer: BC Managed Care – PPO

## 2013-06-20 DIAGNOSIS — Z0000 Encounter for general adult medical examination without abnormal findings: Secondary | ICD-10-CM

## 2013-06-20 DIAGNOSIS — I10 Essential (primary) hypertension: Secondary | ICD-10-CM

## 2013-06-20 DIAGNOSIS — E785 Hyperlipidemia, unspecified: Secondary | ICD-10-CM

## 2013-06-20 LAB — BASIC METABOLIC PANEL
BUN: 10 mg/dL (ref 6–23)
Calcium: 9.4 mg/dL (ref 8.4–10.5)
Creatinine, Ser: 0.8 mg/dL (ref 0.4–1.2)
GFR: 78.7 mL/min (ref >60.00)
Glucose, Bld: 111 mg/dL — ABNORMAL HIGH (ref 70–99)
Sodium: 140 mEq/L (ref 135–145)

## 2013-06-20 LAB — CBC WITH DIFFERENTIAL/PLATELET
Basophils Absolute: 0 10*3/uL (ref 0.0–0.1)
Eosinophils Absolute: 0.1 10*3/uL (ref 0.0–0.7)
Hemoglobin: 14 g/dL (ref 12.0–15.0)
Lymphocytes Relative: 28.3 % (ref 12.0–46.0)
MCHC: 33.7 g/dL (ref 30.0–36.0)
Monocytes Relative: 8 % (ref 3.0–12.0)
Neutrophils Relative %: 62.2 % (ref 43.0–77.0)
Platelets: 292 10*3/uL (ref 150.0–400.0)
RDW: 14.5 % (ref 11.5–14.6)

## 2013-06-20 LAB — LIPID PANEL
HDL: 50.4 mg/dL (ref >39.00)
LDL Cholesterol: 70 mg/dL (ref 0–99)
Total CHOL/HDL Ratio: 3
VLDL: 18.6 mg/dL (ref 0.0–40.0)

## 2013-06-20 LAB — TSH: TSH: 1.98 u[IU]/mL (ref 0.35–5.50)

## 2013-06-20 LAB — HEPATIC FUNCTION PANEL
ALT: 21 U/L (ref 0–35)
Total Bilirubin: 0.5 mg/dL (ref 0.3–1.2)

## 2013-07-04 ENCOUNTER — Encounter: Payer: Self-pay | Admitting: Family Medicine

## 2013-10-03 ENCOUNTER — Other Ambulatory Visit (INDEPENDENT_AMBULATORY_CARE_PROVIDER_SITE_OTHER): Payer: BC Managed Care – PPO

## 2013-10-03 DIAGNOSIS — E785 Hyperlipidemia, unspecified: Secondary | ICD-10-CM

## 2013-10-03 DIAGNOSIS — R7989 Other specified abnormal findings of blood chemistry: Secondary | ICD-10-CM

## 2013-10-03 LAB — HEPATIC FUNCTION PANEL
ALT: 21 U/L (ref 0–35)
AST: 25 U/L (ref 0–37)
Total Bilirubin: 0.6 mg/dL (ref 0.3–1.2)
Total Protein: 7.2 g/dL (ref 6.0–8.3)

## 2013-10-03 LAB — BASIC METABOLIC PANEL
Chloride: 104 mEq/L (ref 96–112)
GFR: 71.28 mL/min (ref >60.00)
Potassium: 3.8 mEq/L (ref 3.5–5.1)
Sodium: 141 mEq/L (ref 135–145)

## 2013-10-03 LAB — HEMOGLOBIN A1C: Hgb A1c MFr Bld: 6.5 % (ref 4.6–6.5)

## 2013-10-03 LAB — LIPID PANEL
Cholesterol: 137 mg/dL (ref 0–200)
HDL: 49.4 mg/dL (ref >39.00)
VLDL: 18.4 mg/dL (ref 0.0–40.0)

## 2013-10-08 ENCOUNTER — Ambulatory Visit (INDEPENDENT_AMBULATORY_CARE_PROVIDER_SITE_OTHER): Payer: BC Managed Care – PPO | Admitting: Family Medicine

## 2013-10-08 ENCOUNTER — Encounter: Payer: Self-pay | Admitting: Family Medicine

## 2013-10-08 VITALS — BP 124/72 | HR 72 | Temp 98.2°F | Wt 200.8 lb

## 2013-10-08 DIAGNOSIS — R609 Edema, unspecified: Secondary | ICD-10-CM

## 2013-10-08 DIAGNOSIS — Z23 Encounter for immunization: Secondary | ICD-10-CM

## 2013-10-08 MED ORDER — HYDROCHLOROTHIAZIDE 25 MG PO TABS: 25.0000 mg | ORAL_TABLET | Freq: Every day | ORAL | Status: DC

## 2013-10-08 NOTE — Patient Instructions (Signed)

## 2013-10-08 NOTE — Progress Notes (Signed)
  Subjective:    Tracy Thompson is a 62 y.o. female who presents for evaluation of edema in both ankles and feet. The edema has been moderate. Onset of symptoms was several weeks ago, and patient reports symptoms have gradually improved since that time. The edema is present all day. The patient states the problem has been intermittent for several years. The swelling has been aggravated by flying or long car trips. The swelling has been relieved by diuretics, support stockings, elevation of involved area, low-salt diet. Associated factors include: nothing. Cardiac risk factors: dyslipidemia and hypertension.  The following portions of the patient's history were reviewed and updated as appropriate: allergies, current medications, past family history, past medical history, past social history, past surgical history and problem list.  Review of Systems Pertinent items are noted in HPI.   Objective:    BP 124/72  Pulse 72  Temp(Src) 98.2 F (36.8 C) (Oral)  Wt 200 lb 12.8 oz (91.082 kg)  BMI 32.16 kg/m2  SpO2 96% General appearance: alert, cooperative, appears stated age and no distress Neck: no adenopathy, supple, symmetrical, trachea midline and thyroid not enlarged, symmetric, no tenderness/mass/nodules Lungs: clear to auscultation bilaterally Heart: S1, S2 normal Extremities: edema +1 pitting edema R>L   Cardiographics ECG: not done  Imaging Chest x-ray: not indicated   Assessment:     Edema.    Plan:    Recommendations: decrease sodium in the diet, elevate feet above the level of the heart whenever possible, increase physical activity, use of compression stockings and weight loss. The patient was also instructed to call IMMEDIATELY (i.e., day or night) if any cardiopulmonary symptoms occur, especially chest pain, shortness of breath, dyspnea on exertion, paroxysmal nocturnal dyspnea, or orthopnea, and these were explained. Follow up in 3 weeks and as needed.

## 2013-10-22 ENCOUNTER — Other Ambulatory Visit (INDEPENDENT_AMBULATORY_CARE_PROVIDER_SITE_OTHER): Payer: BC Managed Care – PPO

## 2013-10-22 DIAGNOSIS — R609 Edema, unspecified: Secondary | ICD-10-CM

## 2013-10-22 LAB — BASIC METABOLIC PANEL
BUN: 12 mg/dL (ref 6–23)
Calcium: 9.3 mg/dL (ref 8.4–10.5)
Chloride: 99 mEq/L (ref 96–112)
Creatinine, Ser: 0.8 mg/dL (ref 0.4–1.2)
GFR: 73.23 mL/min (ref >60.00)
Glucose, Bld: 89 mg/dL (ref 70–99)
Potassium: 3.5 mEq/L (ref 3.5–5.1)
Sodium: 140 mEq/L (ref 135–145)

## 2014-01-23 LAB — HM DEXA SCAN: HM Dexa Scan: NORMAL

## 2014-03-05 ENCOUNTER — Encounter: Payer: Self-pay | Admitting: Family Medicine

## 2014-04-16 ENCOUNTER — Telehealth: Payer: Self-pay

## 2014-04-16 NOTE — Telephone Encounter (Signed)
Medication and allergies:  Reviewed and updated  90 day supply/mail order: n/a Local pharmacy:  [facility], [place]   Immunizations due:  UTD   A/P: No changes to personal, family history or past surgical hx PAP- 02/11/12-normal CCS- 07/25/12-normal MMG- 01/23/14- negative BD- 01/23/14- normal, repeat in 2 years Flu- 10/08/13 Tdap- 04/16/13 Shingles- 10/31/12  To Discuss with Provider: Nothing at this time.

## 2014-04-16 NOTE — Telephone Encounter (Signed)
Left message for call back Non-identifiable   Pap- 02/11/12-normal CCS- 07/25/12 MMG- 01/23/14-negative BD- 01/23/14- normal, repeat in 2 years Flu-10/08/13 Td- 04/16/13 Z- 10/31/12

## 2014-04-17 ENCOUNTER — Ambulatory Visit (INDEPENDENT_AMBULATORY_CARE_PROVIDER_SITE_OTHER): Payer: BC Managed Care – PPO | Admitting: Family Medicine

## 2014-04-17 ENCOUNTER — Telehealth: Payer: Self-pay | Admitting: Family Medicine

## 2014-04-17 ENCOUNTER — Encounter: Payer: Self-pay | Admitting: Family Medicine

## 2014-04-17 VITALS — BP 118/74 | HR 64 | Temp 98.5°F | Ht 66.5 in | Wt 206.4 lb

## 2014-04-17 DIAGNOSIS — Z780 Asymptomatic menopausal state: Secondary | ICD-10-CM

## 2014-04-17 DIAGNOSIS — E785 Hyperlipidemia, unspecified: Secondary | ICD-10-CM

## 2014-04-17 DIAGNOSIS — Z0000 Encounter for general adult medical examination without abnormal findings: Secondary | ICD-10-CM

## 2014-04-17 DIAGNOSIS — I10 Essential (primary) hypertension: Secondary | ICD-10-CM

## 2014-04-17 LAB — POCT URINALYSIS DIPSTICK
Bilirubin, UA: NEGATIVE
Blood, UA: NEGATIVE
Glucose, UA: NEGATIVE
Ketones, UA: NEGATIVE
LEUKOCYTES UA: NEGATIVE
Nitrite, UA: NEGATIVE
PH UA: 7.5
PROTEIN UA: NEGATIVE
Spec Grav, UA: <=1.005
UROBILINOGEN UA: 0.2

## 2014-04-17 LAB — HEPATIC FUNCTION PANEL
ALK PHOS: 50 U/L (ref 39–117)
ALT: 24 U/L (ref 0–35)
AST: 29 U/L (ref 0–37)
Albumin: 3.9 g/dL (ref 3.5–5.2)
BILIRUBIN DIRECT: 0 mg/dL (ref 0.0–0.3)
BILIRUBIN TOTAL: 0.6 mg/dL (ref 0.3–1.2)
Total Protein: 7.6 g/dL (ref 6.0–8.3)

## 2014-04-17 LAB — BASIC METABOLIC PANEL
BUN: 10 mg/dL (ref 6–23)
CO2: 29 mEq/L (ref 19–32)
Calcium: 9.4 mg/dL (ref 8.4–10.5)
Chloride: 103 mEq/L (ref 96–112)
Creatinine, Ser: 0.8 mg/dL (ref 0.4–1.2)
GFR: 79.64 mL/min (ref >60.00)
GLUCOSE: 98 mg/dL (ref 70–99)
Potassium: 3.5 mEq/L (ref 3.5–5.1)
SODIUM: 140 meq/L (ref 135–145)

## 2014-04-17 LAB — CBC WITH DIFFERENTIAL/PLATELET
BASOS ABS: 0 10*3/uL (ref 0.0–0.1)
Basophils Relative: 0.5 % (ref 0.0–3.0)
Eosinophils Absolute: 0.1 10*3/uL (ref 0.0–0.7)
Eosinophils Relative: 1.2 % (ref 0.0–5.0)
HCT: 44.3 % (ref 36.0–46.0)
HEMOGLOBIN: 14.9 g/dL (ref 12.0–15.0)
LYMPHS PCT: 27.4 % (ref 12.0–46.0)
Lymphs Abs: 1.9 10*3/uL (ref 0.7–4.0)
MCHC: 33.8 g/dL (ref 30.0–36.0)
MCV: 91.9 fl (ref 78.0–100.0)
MONOS PCT: 7.8 % (ref 3.0–12.0)
Monocytes Absolute: 0.5 10*3/uL (ref 0.1–1.0)
NEUTROS ABS: 4.4 10*3/uL (ref 1.4–7.7)
Neutrophils Relative %: 63.1 % (ref 43.0–77.0)
PLATELETS: 295 10*3/uL (ref 150.0–400.0)
RBC: 4.82 Mil/uL (ref 3.87–5.11)
RDW: 14.2 % (ref 11.5–14.6)
WBC: 7 10*3/uL (ref 4.5–10.5)

## 2014-04-17 LAB — LIPID PANEL
CHOL/HDL RATIO: 3
Cholesterol: 142 mg/dL (ref 0–200)
HDL: 51.1 mg/dL (ref >39.00)
LDL CALC: 72 mg/dL (ref 0–99)
Triglycerides: 95 mg/dL (ref 0.0–149.0)
VLDL: 19 mg/dL (ref 0.0–40.0)

## 2014-04-17 LAB — TSH: TSH: 2.17 u[IU]/mL (ref 0.35–5.50)

## 2014-04-17 MED ORDER — RALOXIFENE HCL 60 MG PO TABS: ORAL_TABLET | ORAL | Status: DC

## 2014-04-17 MED ORDER — BISOPROLOL-HYDROCHLOROTHIAZIDE 10-6.25 MG PO TABS: ORAL_TABLET | ORAL | Status: DC

## 2014-04-17 NOTE — Patient Instructions (Signed)

## 2014-04-17 NOTE — Progress Notes (Signed)
Subjective:     Tracy Thompson is a 63 y.o. female and is here for a comprehensive physical exam. The patient reports no problems.  History   Social History  . Marital Status: Married    Spouse Name: N/A    Number of Children: N/A  . Years of Education: N/A   Occupational History  . retired Pharmacist, hospital    Social History Main Topics  . Smoking status: Never Smoker   . Smokeless tobacco: Never Used  . Alcohol Use: No  . Drug Use: No  . Sexual Activity: Yes    Partners: Male   Other Topics Concern  . Not on file   Social History Narrative   Exercise--- walking , weights   Health Maintenance  Topic Date Due  . Influenza Vaccine  07/27/2014  . Pap Smear  02/10/2015  . Mammogram  01/24/2016  . Colonoscopy  07/25/2022  . Tetanus/tdap  04/17/2023  . Zostavax  Completed    The following portions of the patient's history were reviewed and updated as appropriate:  She  has a past medical history of Breast cancer (1994); Menopause; Hypertension; and Factor 5 Leiden mutation, heterozygous. She  does not have any pertinent problems on file. She  has past surgical history that includes Cholecystectomy (06/20/2002); Breast lumpectomy; and varicose veins. Her family history includes COPD in her brother; Cancer in her mother; Colon cancer in her mother; Deep vein thrombosis in her father; Diabetes in her father; Hepatitis in her mother; Hepatitis C in her brother; Hyperlipidemia in her mother; Hypertension in her father and mother; Liver disease in an other family member; Mental illness in her brother; Prostate cancer in her father; Pulmonary embolism in her father; Schizophrenia in her brother; Stroke in her father and mother. She  reports that she has never smoked. She has never used smokeless tobacco. She reports that she does not drink alcohol or use illicit drugs. She has a current medication list which includes the following prescription(s): aspirin, bisoprolol-hydrochlorothiazide,  cholecalciferol, coenzyme q10, hydrochlorothiazide, meclizine, raloxifene, and simvastatin. Current Outpatient Prescriptions on File Prior to Visit  Medication Sig Dispense Refill  . aspirin 81 MG tablet Take 81 mg by mouth daily.        . cholecalciferol (VITAMIN D) 1000 UNITS tablet Take 1,000 Units by mouth daily.        . Coenzyme Q10 200 MG capsule Take 200 mg by mouth daily.      . hydrochlorothiazide (HYDRODIURIL) 25 MG tablet Take 1 tablet (25 mg total) by mouth daily.  30 tablet  11  . meclizine (ANTIVERT) 25 MG tablet Take 25 mg by mouth 3 (three) times daily as needed.        . simvastatin (ZOCOR) 20 MG tablet take 1 tablet by mouth once daily  90 tablet  3   No current facility-administered medications on file prior to visit.   She has No Known Allergies..  Review of Systems Review of Systems  Constitutional: Negative for activity change, appetite change and fatigue.  HENT: Negative for hearing loss, congestion, tinnitus and ear discharge.  dentist q74m Eyes: Negative for visual disturbance (see optho q2y -- vision corrected to 20/20 with glasses).  Respiratory: Negative for cough, chest tightness and shortness of breath.   Cardiovascular: Negative for chest pain, palpitations and leg swelling.  Gastrointestinal: Negative for abdominal pain, diarrhea, constipation and abdominal distention.  Genitourinary: Negative for urgency, frequency, decreased urine volume and difficulty urinating.  Musculoskeletal: Negative for back pain, arthralgias  and gait problem.  Skin: Negative for color change, pallor and rash.  Neurological: Negative for dizziness, light-headedness, numbness and headaches.  Hematological: Negative for adenopathy. Does not bruise/bleed easily.  Psychiatric/Behavioral: Negative for suicidal ideas, confusion, sleep disturbance, self-injury, dysphoric mood, decreased concentration and agitation.       Objective:    BP 118/74  Pulse 64  Temp(Src) 98.5 F (36.9  C) (Oral)  Ht 5' 6.5" (1.689 m)  Wt 206 lb 6.4 oz (93.622 kg)  BMI 32.82 kg/m2  SpO2 95% General appearance: alert, cooperative, appears stated age and no distress Head: Normocephalic, without obvious abnormality, atraumatic Eyes: conjunctivae/corneas clear. PERRL, EOM&#39;s intact. Fundi benign. Ears: normal TM's and external ear canals both ears Nose: Nares normal. Septum midline. Mucosa normal. No drainage or sinus tenderness. Throat: lips, mucosa, and tongue normal; teeth and gums normal Neck: no adenopathy, no carotid bruit, no JVD, supple, symmetrical, trachea midline and thyroid not enlarged, symmetric, no tenderness/mass/nodules Back: symmetric, no curvature. ROM normal. No CVA tenderness. Lungs: clear to auscultation bilaterally Breasts: normal appearance, no masses or tenderness Heart: regular rate and rhythm, S1, S2 normal, no murmur, click, rub or gallop Abdomen: soft, non-tender; bowel sounds normal; no masses,  no organomegaly Pelvic: deferred Extremities: edema -- + trace pitting edema Pulses: 2+ and symmetric Skin: Skin color, texture, turgor normal. No rashes or lesions Lymph nodes: Cervical, supraclavicular, and axillary nodes normal. Neurologic: Alert and oriented X 3, normal strength and tone. Normal symmetric reflexes. Normal coordination and gait Psych-- no depression , no anxiety      Assessment:    Healthy female exam.      Plan:     See After Visit Summary for Counseling Recommendations   1. HTN (hypertension) stable - bisoprolol-hydrochlorothiazide (ZIAC) 10-6.25 MG per tablet; take 1 tablet by mouth once daily  Dispense: 90 tablet; Refill: 3 - Basic metabolic panel - CBC with Differential - POCT urinalysis dipstick - TSH  2. Postmenopausal  - raloxifene (EVISTA) 60 MG tablet; TAKE 1 TABLET BY MOUTH ONCE DAILY  Dispense: 90 tablet; Refill: 3 - TSH  3. Preventative health care  - POCT urinalysis dipstick - TSH  4. Other and unspecified  hyperlipidemia Check labs and con't meds - Hepatic function panel - Lipid panel - TSH

## 2014-04-17 NOTE — Telephone Encounter (Signed)
Relevant patient education assigned to patient using Emmi. ° °

## 2014-04-17 NOTE — Progress Notes (Signed)
Pre visit review using our clinic review tool, if applicable. No additional management support is needed unless otherwise documented below in the visit note. 

## 2014-04-26 ENCOUNTER — Encounter: Payer: Self-pay | Admitting: Family Medicine

## 2014-04-26 MED ORDER — PITAVASTATIN CALCIUM 2 MG PO TABS: 1.0000 | ORAL_TABLET | Freq: Every day | ORAL | Status: DC

## 2014-07-30 ENCOUNTER — Other Ambulatory Visit: Payer: Self-pay | Admitting: Family Medicine

## 2014-08-05 ENCOUNTER — Other Ambulatory Visit: Payer: Self-pay | Admitting: Family Medicine

## 2014-08-05 ENCOUNTER — Encounter: Payer: Self-pay | Admitting: Family Medicine

## 2014-08-05 ENCOUNTER — Ambulatory Visit (INDEPENDENT_AMBULATORY_CARE_PROVIDER_SITE_OTHER): Payer: BC Managed Care – PPO | Admitting: Family Medicine

## 2014-08-05 ENCOUNTER — Ambulatory Visit (HOSPITAL_COMMUNITY): Payer: BC Managed Care – PPO | Attending: Cardiology | Admitting: Radiology

## 2014-08-05 VITALS — BP 130/66 | HR 83 | Temp 98.5°F | Wt 204.0 lb

## 2014-08-05 DIAGNOSIS — M79601 Pain in right arm: Secondary | ICD-10-CM

## 2014-08-05 DIAGNOSIS — M79609 Pain in unspecified limb: Secondary | ICD-10-CM | POA: Insufficient documentation

## 2014-08-05 DIAGNOSIS — L03113 Cellulitis of right upper limb: Secondary | ICD-10-CM

## 2014-08-05 DIAGNOSIS — M7989 Other specified soft tissue disorders: Secondary | ICD-10-CM | POA: Insufficient documentation

## 2014-08-05 DIAGNOSIS — I890 Lymphedema, not elsewhere classified: Secondary | ICD-10-CM

## 2014-08-05 MED ORDER — CEPHALEXIN 500 MG PO CAPS: 500.0000 mg | ORAL_CAPSULE | Freq: Two times a day (BID) | ORAL | Status: DC

## 2014-08-05 MED ORDER — HYDROCODONE-ACETAMINOPHEN 5-325 MG PO TABS: 1.0000 | ORAL_TABLET | Freq: Four times a day (QID) | ORAL | Status: DC | PRN

## 2014-08-05 NOTE — Progress Notes (Signed)
   Subjective:    Patient ID: Tracy Thompson, female    DOB: 07-08-1951, 63 y.o.   MRN: [id number]  HPI Pt here c/o hx lymphedema in R arm.  Pt c/o worsening swelling and pain in R low ext--8/10.   Pt has had hx cellulitis in past but this is different.  No chest pain or sob.      Review of Systems As above    Objective:   Physical Exam BP 130/66  Pulse 83  Temp(Src) 98.5 F (36.9 C) (Oral)  Wt 204 lb (92.534 kg)  SpO2 97% General appearance: alert, cooperative, appears stated age and no distress Throat: lips, mucosa, and tongue normal; teeth and gums normal Neck: no adenopathy, supple, symmetrical, trachea midline and thyroid not enlarged, symmetric, no tenderness/mass/nodules Lungs: clear to auscultation bilaterally Heart: S1, S2 normal Extremities: edema swelling and errythema R forearm and pain medially        Assessment & Plan:  1. Right arm pain Hx lymphedema--- hx breast cancer Arm sleeve If doppler neg-- consider abx and vascular referral - Upper extremity Venous Duplex Right; Future - HYDROcodone-acetaminophen (NORCO/VICODIN) 5-325 MG per tablet; Take 1 tablet by mouth every 6 (six) hours as needed for moderate pain.  Dispense: 30 tablet; Refill: 0

## 2014-08-05 NOTE — Progress Notes (Signed)
Pre visit review using our clinic review tool, if applicable. No additional management support is needed unless otherwise documented below in the visit note. 

## 2014-08-05 NOTE — Patient Instructions (Signed)
Lymphedema Lymphedema is a swelling caused by the abnormal collection of lymph under the skin. The lymph is fluid from the tissues in your body that travels in the lymphatic system. This system is part of the immune system that includes lymph nodes and vessels. The lymph vessels collect and carry the excess fluid, fats, proteins, and wastes from the tissues of the body to the bloodstream. This system also works to clean and remove bacteria and waste products from the body.  Lymphedema occurs when the lymphatic system is blocked. When the lymph vessels or lymph nodes are blocked or damaged, lymph does not drain properly. This causes abnormal build up of lymph. This leads to swelling in the arms or legs. Lymphedema cannot be cured by medicines. But the swelling can be reduced by physical methods. CAUSES  There are two types of lymphedema. Primary lymphedema is caused by the absence or abnormality of the lymph vessel at birth. It is also known as inherited lymphedema, which occurs rarely. Secondary or acquired lymphedema occurs when the lymph vessel is damaged or blocked. The causes of lymph vessel blockage are:   Skin infection like cellulites.  Infection by parasites (filariasis).  Injury.  Cancer.  Radiation therapy.  Formation of scar tissue.  Surgery. SYMPTOMS  The symptoms of lymphedema are:  Abnormal swelling of the arm or leg.  Heavy or tight feeling in your arm or leg.  Tight-fitting shoes or rings.  Redness of skin over the affected area.  Limited movement of the affected limb.  Some patients complain about sensitivity to touch and discomfort in the limb(s) affected. You may not have these symptoms immediately following injury. They usually appear within a few days or even years after injury. Inform your caregiver, if you have any of these symptoms. Early treatment can avoid further problems.  DIAGNOSIS  First, your caregiver will inquire about any surgery you have had or  medicines you are taking. He will then examine you. Your caregiver may order special imaging tests, such as:  Lymphoscintigraphy (a test in which a low dose of radioactive substance is injected to trace the flow of lymph through the lymph vessels).  MRI (imaging tests using magnetic fields).  Computed tomography (test using special cross-sectional X-rays).  Duplex ultrasound (test using high-frequency sound waves to show the vessels and the blood flow on a screen).  Lymphangiography (special X-ray taken after injecting a contrast dye into the lymph vessel). It is now rarely done. TREATMENT  Lymphedema can be treated in different ways. Your caregiver will decide the type of treatment depending on the cause. Treatment may include:  Exercise: Special exercises will help fluid move out easily from the affected part. This should be done as per your caregiver's advice.  Manual lymph drainage: Gentle massage of the affected limb makes the fluid to move out more freely.  Compression: Compression stockings or external pump apply pressure over the affected limb. This helps the fluid to move out from the arm or leg. Bandaging can also help to move the fluid out from the affected part. Your caregiver will decide the method that suits you the best.  Medicines: Your caregiver may prescribe antibiotics, if you have infection.  Surgery: Your caregiver may advise surgery for severe lymphedema. It is reserved for special cases when the patient has difficulty moving. Your surgeon may remove excess tissue from the arm or leg. This will help to ease your movement. Physical therapy may have to be continued after surgery. HOME CARE INSTRUCTIONS    The area is very fragile and is predisposed to injury and infection.  Eat a healthy diet.  Exercise regularly as per advice.  Keep the affected area clean and dry.  Use gloves while cooking or gardening.  Protect your skin from cuts.  Use electric razor to  shave the affected area.  Keep affected limb elevated.  Do not wear tight clothes, shoes, or jewelry as it may cause the tissue to be strangled.  Do not use heat pads over the affected area.  Do not sit with cross legs.  Do not walk barefoot.  Do not carry weight on the affected arm.  Avoid having blood pressure checked on the affected limb. SEEK MEDICAL CARE IF:  You continue to have swelling in your limb. SEEK IMMEDIATE MEDICAL CARE IF:   You have high fever.  You have skin rash.  You have chills or sweats.  You have pain or redness.  You have a cut that does not heal. MAKE SURE YOU:   Understand these instructions.  Will watch your condition.  Will get help right away if you are not doing well or get worse. Document Released: 10/10/2007 Document Revised: 11/29/2012 Document Reviewed: 09/15/2009 ExitCare Patient Information 2015 [employer]. This information is not intended to replace advice given to you by your health care provider. Make sure you discuss any questions you have with your health care provider.  

## 2014-08-05 NOTE — Progress Notes (Signed)
Right upper extremity venous Duplex performed.

## 2014-08-06 ENCOUNTER — Telehealth: Payer: Self-pay

## 2014-08-06 NOTE — Telephone Encounter (Signed)
Left a message for call back.  

## 2014-08-06 NOTE — Telephone Encounter (Signed)
Dr. Nonda Lou note was discussed with patient.  Pt stated understanding and is in agreement with plan.  No further questions or concerns were voiced.

## 2014-08-06 NOTE — Telephone Encounter (Signed)
Message copied by Rudene Anda on Tue Aug 06, 2014  2:18 PM ------      Message from: Rosalita Chessman      Created: Mon Aug 05, 2014  5:47 PM      Regarding: FW: RUE Duplex       No dvt in arm--  Use sleeve      We will refer her to vascular for evaluation since it has gotten so bad.  Since it was warm to me I will send in abx.      ----- Message -----         From: Jarvis Newcomer McFatter         Sent: 08/05/2014   5:23 PM           To: Rosalita Chessman, DO      Subject: RUE Duplex                                               There was no DVT seen in the right arm.       ------

## 2014-08-22 ENCOUNTER — Encounter: Payer: Self-pay | Admitting: Vascular Surgery

## 2014-08-23 ENCOUNTER — Encounter: Payer: Self-pay | Admitting: Vascular Surgery

## 2014-08-23 ENCOUNTER — Ambulatory Visit (INDEPENDENT_AMBULATORY_CARE_PROVIDER_SITE_OTHER): Payer: BC Managed Care – PPO | Admitting: Vascular Surgery

## 2014-08-23 VITALS — BP 158/72 | HR 82 | Temp 98.1°F | Resp 16 | Ht 66.0 in | Wt 206.0 lb

## 2014-08-23 DIAGNOSIS — I890 Lymphedema, not elsewhere classified: Secondary | ICD-10-CM

## 2014-08-23 NOTE — Progress Notes (Signed)
Referred by:  Rosalita Chessman, DO [address] [place], [place] [address]  Reason for referral: right arm pain  History of Present Illness  Tracy Thompson is a 63 y.o. (09-11-1951) female s/p R lumpectomy with Ax Dx who presents with chief complaint: right arm pain.  This a few weeks ago had acute worsening of pain her R arm.  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  The patient has had prior episodes of cellulitis in her R arm related to her lymphedema.  She denied sx similar to previous she had severe forearm pain that was sharp, worsening with stasis and improved with movement.  Her upper arm also had pain but of lesser intensity.  She denied any fever or chills or cellulitic skin changes.  She denied any recent injuries or history of repetitive stress to the right arm.  She denies any sx of recurrent cancer.  She was placed on a course of abx by her internist.  She notes the pain is essentially resolved at this point.  Past Medical History  Diagnosis Date  . Breast cancer 1994    s/p chemo 8/94-1/95, radiation 8/94-11/94  . Menopause     chemo induced  . Hypertension   . Factor 5 Leiden mutation, heterozygous     also has Factor 8 problems   Past Surgical History  Procedure Laterality Date  . Breast lumpectomy Right 1994    w/ radiation and chemotherapy following lumpectomy  . Varicose veins Bilateral 2013    laser surgery   Dr. Jones Skene  . Cholecystectomy  06/20/2002    Laparoscopic cholestectomy    History   Social History  . Marital Status: Married    Spouse Name: N/A    Number of Children: N/A  . Years of Education: N/A   Occupational History  . retired Pharmacist, hospital    Social History Main Topics  . Smoking status: Never Smoker   . Smokeless tobacco: Never Used  . Alcohol Use: No  . Drug Use: No  . Sexual Activity: Yes    Partners: Male   Other Topics Concern  .  Not on file   Social History Narrative   Exercise--- walking , weights    Family History  Problem Relation Age of Onset  . Cancer Mother     Bladder CA, colon  . Colon cancer Mother   . Stroke Mother   . Hypertension Mother   . Hyperlipidemia Mother   . Hepatitis Mother     hep A  . Hypertension Father   . Prostate cancer Father   . Diabetes Father   . Stroke Father   . Pulmonary embolism Father   . Deep vein thrombosis Father   . Cancer Father     prostate cancer  . Schizophrenia Brother   . Mental illness Brother   . COPD Brother   . Hepatitis C Brother   . Liver disease      Current Outpatient Prescriptions  Medication Sig Dispense Refill  . aspirin 81 MG tablet Take 81 mg by mouth daily.        . bisoprolol-hydrochlorothiazide (ZIAC) 10-6.25 MG per tablet take 1 tablet by mouth once daily  90 tablet  3  . cholecalciferol (VITAMIN D) 1000 UNITS tablet Take 1,000 Units by mouth daily.        . Coenzyme Q10 200  MG capsule Take 200 mg by mouth daily.      . hydrochlorothiazide (HYDRODIURIL) 25 MG tablet Take 1 tablet (25 mg total) by mouth daily.  30 tablet  11  . HYDROcodone-acetaminophen (NORCO/VICODIN) 5-325 MG per tablet Take 1 tablet by mouth every 6 (six) hours as needed for moderate pain.  30 tablet  0  . LIVALO 2 MG TABS take 1 tablet by mouth at bedtime  30 tablet  2  . meclizine (ANTIVERT) 25 MG tablet Take 25 mg by mouth 3 (three) times daily as needed.        . raloxifene (EVISTA) 60 MG tablet TAKE 1 TABLET BY MOUTH ONCE DAILY  90 tablet  3  . cephALEXin (KEFLEX) 500 MG capsule Take 1 capsule (500 mg total) by mouth 2 (two) times daily.  20 capsule  0   No current facility-administered medications for this visit.    No Known Allergies   REVIEW OF SYSTEMS:  (Positives checked otherwise negative)  CARDIOVASCULAR:  []  chest pain, []  chest pressure, []  palpitations, []  shortness of breath when laying flat, []  shortness of breath with exertion,  []  pain in  feet when walking, []  pain in feet when laying flat, []  history of blood clot in veins (DVT), []  history of phlebitis, [x]  swelling in R arm, []  varicose veins  PULMONARY:  []  productive cough, []  asthma, []  wheezing  NEUROLOGIC:  []  weakness in arms or legs, []  numbness in arms or legs, []  difficulty speaking or slurred speech, []  temporary loss of vision in one eye, []  dizziness  HEMATOLOGIC:  []  bleeding problems, []  problems with blood clotting too easily  MUSCULOSKEL:  []  joint pain, []  joint swelling  GASTROINTEST:  []  vomiting blood, []  blood in stool     GENITOURINARY:  []  burning with urination, []  blood in urine  PSYCHIATRIC:  []  history of major depression  INTEGUMENTARY:  []  rashes, []  ulcers  CONSTITUTIONAL:  []  fever, []  chills  For VQI Use Only  PRE-ADM LIVING: Home  AMB STATUS: Ambulatory  CAD Sx: None  PRIOR CHF: None  STRESS TEST: [x]  No, [ ]  Normal, [ ]  + ischemia, [ ]  + MI, [ ]  Both  Physical Examination  Filed Vitals:   08/23/14 1323  BP: 158/72  Pulse: 82  Temp: 98.1 F (36.7 C)  TempSrc: Oral  Resp: 16  Height: 5\' 6"  (1.676 m)  Weight: 206 lb (93.441 kg)  SpO2: 98%    Body mass index is 33.27 kg/(m^2).  General: A&O x 3, WDWN  Head: [place]/AT  Ear/Nose/Throat: Hearing grossly intact, nares w/o erythema or drainage, oropharynx w/o Erythema/Exudate, Mallampati score: 3  Eyes: PERRLA, EOMI  Neck: Supple, no nuchal rigidity, no palpable LAD  Pulmonary: Sym exp, good air movt, CTAB, no rales, rhonchi, & wheezing  Cardiac: RRR, Nl S1, S2, no Murmurs, rubs or gallops  Vascular: Vessel Right Left  Radial Palpable Palpable  Ulnar Faintly Palpable Faintly Palpable  Brachial Palpable Palpable  Carotid Palpable, without bruit Palpable, without bruit  Aorta Not palpable N/A  Femoral Palpable Palpable  Popliteal Not palpable Not palpable  PT  Palpable  Palpable  DP  Palpable  Palpable   Gastrointestinal: soft, NTND, -G/R, - HSM, -  masses, - CVAT B  Musculoskeletal: M/S 5/5 throughout , Extremities without ischemic changes , R entire arm somewhat swollen, no pitting, L axilla with no palpable masses or LN, incision well healed  Neurologic: CN 2-12 intact , Pain and light touch intact  in extremities except slightly decreased sensation in back of R arm, Motor exam as listed above  Psychiatric: Judgment intact, Mood & affect appropriate for pt's clinical situation  Dermatologic: See M/S exam for extremity exam, no rashes otherwise noted  Lymph : No Cervical, Axillary, or Inguinal lymphadenopathy; no masses in R ax  Outside RUE Venous duplex (08/05/14) No DVT or SVT  Outside Studies/Documentation 2 pages of outside documents were reviewed including: outside PCP chart, R UE venous duplex (08/05/14).  Medical Decision Making  Tracy Thompson is a 63 y.o. 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   Sx are not consistent with lymphedema as origin.  Hx is also not consistent with thoracic outlet.  Sx are vague suggestive of a neuropathic origin, whether due to to compressive mechanism remains to be seen as the patient's sx are essentially resolved.    We briefly discussed considering wearing a compression sleeve to try to help decrease swelling and related recurrence of cellulitis.  I discussed briefly cubital tunnel > carpal tunnel compression with the patient.    If she has another episode, I think evaluation by Hand Surgery might be beneficial.  Thank you for allowing [place] to participate in this patient's care.  Adele Barthel, MD [facility] Office: [phone] Pager: [phone]  08/23/2014, 2:01 PM

## 2014-09-27 ENCOUNTER — Other Ambulatory Visit: Payer: Self-pay | Admitting: Family Medicine

## 2014-10-01 ENCOUNTER — Telehealth: Payer: Self-pay | Admitting: Family Medicine

## 2014-10-01 DIAGNOSIS — E785 Hyperlipidemia, unspecified: Secondary | ICD-10-CM

## 2014-10-01 NOTE — Telephone Encounter (Signed)
Orders in, please schedule.     KP

## 2014-10-01 NOTE — Telephone Encounter (Signed)
Pt was advised at her last physical (04/17/2014)  to retake lab work in 6 months. Pt requesting orders

## 2014-10-08 ENCOUNTER — Ambulatory Visit (INDEPENDENT_AMBULATORY_CARE_PROVIDER_SITE_OTHER): Payer: BC Managed Care – PPO | Admitting: *Deleted

## 2014-10-08 DIAGNOSIS — Z23 Encounter for immunization: Secondary | ICD-10-CM

## 2014-10-22 ENCOUNTER — Other Ambulatory Visit (INDEPENDENT_AMBULATORY_CARE_PROVIDER_SITE_OTHER): Payer: BC Managed Care – PPO

## 2014-10-22 DIAGNOSIS — E785 Hyperlipidemia, unspecified: Secondary | ICD-10-CM

## 2014-10-22 LAB — LIPID PANEL
CHOLESTEROL: 140 mg/dL (ref 0–200)
HDL: 45.9 mg/dL (ref >39.00)
LDL Cholesterol: 77 mg/dL (ref 0–99)
NonHDL: 94.1
Total CHOL/HDL Ratio: 3
Triglycerides: 85 mg/dL (ref 0.0–149.0)
VLDL: 17 mg/dL (ref 0.0–40.0)

## 2014-10-22 LAB — HEPATIC FUNCTION PANEL
ALT: 18 U/L (ref 0–35)
AST: 23 U/L (ref 0–37)
Albumin: 3.4 g/dL — ABNORMAL LOW (ref 3.5–5.2)
Alkaline Phosphatase: 48 U/L (ref 39–117)
BILIRUBIN TOTAL: 0.3 mg/dL (ref 0.2–1.2)
Bilirubin, Direct: 0 mg/dL (ref 0.0–0.3)
Total Protein: 7.4 g/dL (ref 6.0–8.3)

## 2014-10-28 ENCOUNTER — Other Ambulatory Visit: Payer: Self-pay | Admitting: Family Medicine

## 2015-03-28 ENCOUNTER — Other Ambulatory Visit: Payer: Self-pay | Admitting: Family Medicine

## 2015-04-01 ENCOUNTER — Telehealth: Payer: Self-pay | Admitting: Family Medicine

## 2015-04-01 NOTE — Telephone Encounter (Signed)
Pre visit letter sent  °

## 2015-04-02 ENCOUNTER — Encounter: Payer: Self-pay | Admitting: Family Medicine

## 2015-04-21 ENCOUNTER — Telehealth: Payer: Self-pay | Admitting: *Deleted

## 2015-04-21 ENCOUNTER — Encounter: Payer: Self-pay | Admitting: *Deleted

## 2015-04-21 NOTE — Telephone Encounter (Signed)
Pre-Visit Call completed with patient and chart updated.   Pre-Visit Info documented in Specialty Comments under [employer].    

## 2015-04-21 NOTE — Addendum Note (Signed)
Addended by: Leticia Penna A on: 04/21/2015 03:55 PM   Modules accepted: Orders, Medications

## 2015-04-21 NOTE — Telephone Encounter (Signed)
Unable to reach patient at time of Pre-Visit Call.  Left message for patient to return call when available.    

## 2015-04-22 ENCOUNTER — Ambulatory Visit (INDEPENDENT_AMBULATORY_CARE_PROVIDER_SITE_OTHER): Payer: BC Managed Care – PPO | Admitting: Family Medicine

## 2015-04-22 ENCOUNTER — Encounter: Payer: Self-pay | Admitting: Family Medicine

## 2015-04-22 ENCOUNTER — Other Ambulatory Visit (HOSPITAL_COMMUNITY)
Admission: RE | Admit: 2015-04-22 | Discharge: 2015-04-22 | Disposition: A | Discharge status: Home / Self Care | Payer: BC Managed Care – PPO | Source: Ambulatory Visit | Attending: Family Medicine | Admitting: Family Medicine

## 2015-04-22 VITALS — BP 122/70 | HR 63 | Temp 98.1°F | Ht 66.0 in | Wt 206.0 lb

## 2015-04-22 DIAGNOSIS — Z1151 Encounter for screening for human papillomavirus (HPV): Secondary | ICD-10-CM | POA: Insufficient documentation

## 2015-04-22 DIAGNOSIS — Z124 Encounter for screening for malignant neoplasm of cervix: Secondary | ICD-10-CM | POA: Diagnosis not present

## 2015-04-22 DIAGNOSIS — Z780 Asymptomatic menopausal state: Secondary | ICD-10-CM

## 2015-04-22 DIAGNOSIS — Z23 Encounter for immunization: Secondary | ICD-10-CM | POA: Diagnosis not present

## 2015-04-22 DIAGNOSIS — Z0000 Encounter for general adult medical examination without abnormal findings: Secondary | ICD-10-CM

## 2015-04-22 DIAGNOSIS — Z01419 Encounter for gynecological examination (general) (routine) without abnormal findings: Secondary | ICD-10-CM | POA: Insufficient documentation

## 2015-04-22 DIAGNOSIS — E785 Hyperlipidemia, unspecified: Secondary | ICD-10-CM | POA: Diagnosis not present

## 2015-04-22 DIAGNOSIS — I10 Essential (primary) hypertension: Secondary | ICD-10-CM | POA: Diagnosis not present

## 2015-04-22 LAB — TSH: TSH: 3.76 u[IU]/mL (ref 0.35–4.50)

## 2015-04-22 LAB — CBC WITH DIFFERENTIAL/PLATELET
BASOS PCT: 0.4 % (ref 0.0–3.0)
Basophils Absolute: 0 10*3/uL (ref 0.0–0.1)
Eosinophils Absolute: 0.1 10*3/uL (ref 0.0–0.7)
Eosinophils Relative: 1.2 % (ref 0.0–5.0)
HCT: 41.8 % (ref 36.0–46.0)
Hemoglobin: 14 g/dL (ref 12.0–15.0)
Lymphocytes Relative: 34 % (ref 12.0–46.0)
Lymphs Abs: 2.2 10*3/uL (ref 0.7–4.0)
MCHC: 33.5 g/dL (ref 30.0–36.0)
MCV: 91 fl (ref 78.0–100.0)
MONO ABS: 0.4 10*3/uL (ref 0.1–1.0)
MONOS PCT: 6.7 % (ref 3.0–12.0)
NEUTROS PCT: 57.7 % (ref 43.0–77.0)
Neutro Abs: 3.7 10*3/uL (ref 1.4–7.7)
PLATELETS: 304 10*3/uL (ref 150.0–400.0)
RBC: 4.6 Mil/uL (ref 3.87–5.11)
RDW: 14.2 % (ref 11.5–15.5)
WBC: 6.4 10*3/uL (ref 4.0–10.5)

## 2015-04-22 LAB — HEPATIC FUNCTION PANEL
ALBUMIN: 4.1 g/dL (ref 3.5–5.2)
ALT: 18 U/L (ref 0–35)
AST: 20 U/L (ref 0–37)
Alkaline Phosphatase: 55 U/L (ref 39–117)
BILIRUBIN DIRECT: 0.1 mg/dL (ref 0.0–0.3)
Total Bilirubin: 0.4 mg/dL (ref 0.2–1.2)
Total Protein: 7.1 g/dL (ref 6.0–8.3)

## 2015-04-22 LAB — BASIC METABOLIC PANEL
BUN: 11 mg/dL (ref 6–23)
CHLORIDE: 103 meq/L (ref 96–112)
CO2: 28 mEq/L (ref 19–32)
CREATININE: 0.86 mg/dL (ref 0.40–1.20)
Calcium: 9.4 mg/dL (ref 8.4–10.5)
GFR: 70.92 mL/min (ref >60.00)
GLUCOSE: 98 mg/dL (ref 70–99)
POTASSIUM: 3.6 meq/L (ref 3.5–5.1)
Sodium: 140 mEq/L (ref 135–145)

## 2015-04-22 LAB — LIPID PANEL
CHOLESTEROL: 128 mg/dL (ref 0–200)
HDL: 51.7 mg/dL (ref >39.00)
LDL CALC: 58 mg/dL (ref 0–99)
NonHDL: 76.3
TRIGLYCERIDES: 91 mg/dL (ref 0.0–149.0)
Total CHOL/HDL Ratio: 2
VLDL: 18.2 mg/dL (ref 0.0–40.0)

## 2015-04-22 LAB — MICROALBUMIN / CREATININE URINE RATIO
CREATININE, U: 154.9 mg/dL
MICROALB UR: 0.7 mg/dL (ref 0.0–1.9)
MICROALB/CREAT RATIO: 0.5 mg/g (ref 0.0–30.0)

## 2015-04-22 MED ORDER — HYDROCHLOROTHIAZIDE 25 MG PO TABS: 25.0000 mg | ORAL_TABLET | Freq: Every day | ORAL | Status: DC

## 2015-04-22 MED ORDER — RALOXIFENE HCL 60 MG PO TABS: ORAL_TABLET | ORAL | Status: DC

## 2015-04-22 MED ORDER — PITAVASTATIN CALCIUM 2 MG PO TABS: 1.0000 | ORAL_TABLET | Freq: Every day | ORAL | Status: DC

## 2015-04-22 MED ORDER — BISOPROLOL-HYDROCHLOROTHIAZIDE 10-6.25 MG PO TABS: ORAL_TABLET | ORAL | Status: DC

## 2015-04-22 NOTE — Addendum Note (Signed)
Addended by: Reino Bellis on: 04/22/2015 10:43 AM   Modules accepted: Orders

## 2015-04-22 NOTE — Addendum Note (Signed)
Addended by: Ewing Schlein on: 04/22/2015 10:35 AM   Modules accepted: Orders

## 2015-04-22 NOTE — Patient Instructions (Signed)
Preventive Care for Adults A healthy lifestyle and preventive care can promote health and wellness. Preventive health guidelines for women include the following key practices.  A routine yearly physical is a good way to check with your health care provider about your health and preventive screening. It is a chance to share any concerns and updates on your health and to receive a thorough exam.  Visit your dentist for a routine exam and preventive care every 6 months. Brush your teeth twice a day and floss once a day. Good oral hygiene prevents tooth decay and gum disease.  The frequency of eye exams is based on your age, health, family medical history, use of contact lenses, and other factors. Follow your health care provider's recommendations for frequency of eye exams.  Eat a healthy diet. Foods like vegetables, fruits, whole grains, low-fat dairy products, and lean protein foods contain the nutrients you need without too many calories. Decrease your intake of foods high in solid fats, added sugars, and salt. Eat the right amount of calories for you.Get information about a proper diet from your health care provider, if necessary.  Regular physical exercise is one of the most important things you can do for your health. Most adults should get at least 150 minutes of moderate-intensity exercise (any activity that increases your heart rate and causes you to sweat) each week. In addition, most adults need muscle-strengthening exercises on 2 or more days a week.  Maintain a healthy weight. The body mass index (BMI) is a screening tool to identify possible weight problems. It provides an estimate of body fat based on height and weight. Your health care provider can find your BMI and can help you achieve or maintain a healthy weight.For adults 20 years and older:  A BMI below 18.5 is considered underweight.  A BMI of 18.5 to 24.9 is normal.  A BMI of 25 to 29.9 is considered overweight.  A BMI of  30 and above is considered obese.  Maintain normal blood lipids and cholesterol levels by exercising and minimizing your intake of saturated fat. Eat a balanced diet with plenty of fruit and vegetables. Blood tests for lipids and cholesterol should begin at age 76 and be repeated every 5 years. If your lipid or cholesterol levels are high, you are over 50, or you are at high risk for heart disease, you may need your cholesterol levels checked more frequently.Ongoing high lipid and cholesterol levels should be treated with medicines if diet and exercise are not working.  If you smoke, find out from your health care provider how to quit. If you do not use tobacco, do not start.  Lung cancer screening is recommended for adults aged 22-80 years who are at high risk for developing lung cancer because of a history of smoking. A yearly low-dose CT scan of the lungs is recommended for people who have at least a 30-pack-year history of smoking and are a current smoker or have quit within the past 15 years. A pack year of smoking is smoking an average of 1 pack of cigarettes a day for 1 year (for example: 1 pack a day for 30 years or 2 packs a day for 15 years). Yearly screening should continue until the smoker has stopped smoking for at least 15 years. Yearly screening should be stopped for people who develop a health problem that would prevent them from having lung cancer treatment.  If you are pregnant, do not drink alcohol. If you are breastfeeding,  be very cautious about drinking alcohol. If you are not pregnant and choose to drink alcohol, do not have more than 1 drink per day. One drink is considered to be 12 ounces (355 mL) of beer, 5 ounces (148 mL) of wine, or 1.5 ounces (44 mL) of liquor.  Avoid use of street drugs. Do not share needles with anyone. Ask for help if you need support or instructions about stopping the use of drugs.  High blood pressure causes heart disease and increases the risk of  stroke. Your blood pressure should be checked at least every 1 to 2 years. Ongoing high blood pressure should be treated with medicines if weight loss and exercise do not work.  If you are 75-52 years old, ask your health care provider if you should take aspirin to prevent strokes.  Diabetes screening involves taking a blood sample to check your fasting blood sugar level. This should be done once every 3 years, after age 15, if you are within normal weight and without risk factors for diabetes. Testing should be considered at a younger age or be carried out more frequently if you are overweight and have at least 1 risk factor for diabetes.  Breast cancer screening is essential preventive care for women. You should practice "breast self-awareness." This means understanding the normal appearance and feel of your breasts and may include breast self-examination. Any changes detected, no matter how small, should be reported to a health care provider. Women in their 58s and 30s should have a clinical breast exam (CBE) by a health care provider as part of a regular health exam every 1 to 3 years. After age 16, women should have a CBE every year. Starting at age 53, women should consider having a mammogram (breast X-ray test) every year. Women who have a family history of breast cancer should talk to their health care provider about genetic screening. Women at a high risk of breast cancer should talk to their health care providers about having an MRI and a mammogram every year.  Breast cancer gene (BRCA)-related cancer risk assessment is recommended for women who have family members with BRCA-related cancers. BRCA-related cancers include breast, ovarian, tubal, and peritoneal cancers. Having family members with these cancers may be associated with an increased risk for harmful changes (mutations) in the breast cancer genes BRCA1 and BRCA2. Results of the assessment will determine the need for genetic counseling and  BRCA1 and BRCA2 testing.  Routine pelvic exams to screen for cancer are no longer recommended for nonpregnant women who are considered low risk for cancer of the pelvic organs (ovaries, uterus, and vagina) and who do not have symptoms. Ask your health care provider if a screening pelvic exam is right for you.  If you have had past treatment for cervical cancer or a condition that could lead to cancer, you need Pap tests and screening for cancer for at least 20 years after your treatment. If Pap tests have been discontinued, your risk factors (such as having a new sexual partner) need to be reassessed to determine if screening should be resumed. Some women have medical problems that increase the chance of getting cervical cancer. In these cases, your health care provider may recommend more frequent screening and Pap tests.  The HPV test is an additional test that may be used for cervical cancer screening. The HPV test looks for the virus that can cause the cell changes on the cervix. The cells collected during the Pap test can be  tested for HPV. The HPV test could be used to screen women aged 30 years and older, and should be used in women of any age who have unclear Pap test results. After the age of 30, women should have HPV testing at the same frequency as a Pap test.  Colorectal cancer can be detected and often prevented. Most routine colorectal cancer screening begins at the age of 50 years and continues through age 75 years. However, your health care provider may recommend screening at an earlier age if you have risk factors for colon cancer. On a yearly basis, your health care provider may provide home test kits to check for hidden blood in the stool. Use of a small camera at the end of a tube, to directly examine the colon (sigmoidoscopy or colonoscopy), can detect the earliest forms of colorectal cancer. Talk to your health care provider about this at age 50, when routine screening begins. Direct  exam of the colon should be repeated every 5-10 years through age 75 years, unless early forms of pre-cancerous polyps or small growths are found.  People who are at an increased risk for hepatitis B should be screened for this virus. You are considered at high risk for hepatitis B if:  You were born in a country where hepatitis B occurs often. Talk with your health care provider about which countries are considered high risk.  Your parents were born in a high-risk country and you have not received a shot to protect against hepatitis B (hepatitis B vaccine).  You have HIV or AIDS.  You use needles to inject street drugs.  You live with, or have sex with, someone who has hepatitis B.  You get hemodialysis treatment.  You take certain medicines for conditions like cancer, organ transplantation, and autoimmune conditions.  Hepatitis C blood testing is recommended for all people born from 1945 through 1965 and any individual with known risks for hepatitis C.  Practice safe sex. Use condoms and avoid high-risk sexual practices to reduce the spread of sexually transmitted infections (STIs). STIs include gonorrhea, chlamydia, syphilis, trichomonas, herpes, HPV, and human immunodeficiency virus (HIV). Herpes, HIV, and HPV are viral illnesses that have no cure. They can result in disability, cancer, and death.  You should be screened for sexually transmitted illnesses (STIs) including gonorrhea and chlamydia if:  You are sexually active and are younger than 24 years.  You are older than 24 years and your health care provider tells you that you are at risk for this type of infection.  Your sexual activity has changed since you were last screened and you are at an increased risk for chlamydia or gonorrhea. Ask your health care provider if you are at risk.  If you are at risk of being infected with HIV, it is recommended that you take a prescription medicine daily to prevent HIV infection. This is  called preexposure prophylaxis (PrEP). You are considered at risk if:  You are a heterosexual woman, are sexually active, and are at increased risk for HIV infection.  You take drugs by injection.  You are sexually active with a partner who has HIV.  Talk with your health care provider about whether you are at high risk of being infected with HIV. If you choose to begin PrEP, you should first be tested for HIV. You should then be tested every 3 months for as long as you are taking PrEP.  Osteoporosis is a disease in which the bones lose minerals and strength   with aging. This can result in serious bone fractures or breaks. The risk of osteoporosis can be identified using a bone density scan. Women ages 65 years and over and women at risk for fractures or osteoporosis should discuss screening with their health care providers. Ask your health care provider whether you should take a calcium supplement or vitamin D to reduce the rate of osteoporosis.  Menopause can be associated with physical symptoms and risks. Hormone replacement therapy is available to decrease symptoms and risks. You should talk to your health care provider about whether hormone replacement therapy is right for you.  Use sunscreen. Apply sunscreen liberally and repeatedly throughout the day. You should seek shade when your shadow is shorter than you. Protect yourself by wearing long sleeves, pants, a wide-brimmed hat, and sunglasses year round, whenever you are outdoors.  Once a month, do a whole body skin exam, using a mirror to look at the skin on your back. Tell your health care provider of new moles, moles that have irregular borders, moles that are larger than a pencil eraser, or moles that have changed in shape or color.  Stay current with required vaccines (immunizations).  Influenza vaccine. All adults should be immunized every year.  Tetanus, diphtheria, and acellular pertussis (Td, Tdap) vaccine. Pregnant women should  receive 1 dose of Tdap vaccine during each pregnancy. The dose should be obtained regardless of the length of time since the last dose. Immunization is preferred during the 27th-36th week of gestation. An adult who has not previously received Tdap or who does not know her vaccine status should receive 1 dose of Tdap. This initial dose should be followed by tetanus and diphtheria toxoids (Td) booster doses every 10 years. Adults with an unknown or incomplete history of completing a 3-dose immunization series with Td-containing vaccines should begin or complete a primary immunization series including a Tdap dose. Adults should receive a Td booster every 10 years.  Varicella vaccine. An adult without evidence of immunity to varicella should receive 2 doses or a second dose if she has previously received 1 dose. Pregnant females who do not have evidence of immunity should receive the first dose after pregnancy. This first dose should be obtained before leaving the health care facility. The second dose should be obtained 4-8 weeks after the first dose.  Human papillomavirus (HPV) vaccine. Females aged 13-26 years who have not received the vaccine previously should obtain the 3-dose series. The vaccine is not recommended for use in pregnant females. However, pregnancy testing is not needed before receiving a dose. If a female is found to be pregnant after receiving a dose, no treatment is needed. In that case, the remaining doses should be delayed until after the pregnancy. Immunization is recommended for any person with an immunocompromised condition through the age of 26 years if she did not get any or all doses earlier. During the 3-dose series, the second dose should be obtained 4-8 weeks after the first dose. The third dose should be obtained 24 weeks after the first dose and 16 weeks after the second dose.  Zoster vaccine. One dose is recommended for adults aged 60 years or older unless certain conditions are  present.  Measles, mumps, and rubella (MMR) vaccine. Adults born before 1957 generally are considered immune to measles and mumps. Adults born in 1957 or later should have 1 or more doses of MMR vaccine unless there is a contraindication to the vaccine or there is laboratory evidence of immunity to   each of the three diseases. A routine second dose of MMR vaccine should be obtained at least 28 days after the first dose for students attending postsecondary schools, health care workers, or international travelers. People who received inactivated measles vaccine or an unknown type of measles vaccine during 1963-1967 should receive 2 doses of MMR vaccine. People who received inactivated mumps vaccine or an unknown type of mumps vaccine before 1979 and are at high risk for mumps infection should consider immunization with 2 doses of MMR vaccine. For females of childbearing age, rubella immunity should be determined. If there is no evidence of immunity, females who are not pregnant should be vaccinated. If there is no evidence of immunity, females who are pregnant should delay immunization until after pregnancy. Unvaccinated health care workers born before 1957 who lack laboratory evidence of measles, mumps, or rubella immunity or laboratory confirmation of disease should consider measles and mumps immunization with 2 doses of MMR vaccine or rubella immunization with 1 dose of MMR vaccine.  Pneumococcal 13-valent conjugate (PCV13) vaccine. When indicated, a person who is uncertain of her immunization history and has no record of immunization should receive the PCV13 vaccine. An adult aged 19 years or older who has certain medical conditions and has not been previously immunized should receive 1 dose of PCV13 vaccine. This PCV13 should be followed with a dose of pneumococcal polysaccharide (PPSV23) vaccine. The PPSV23 vaccine dose should be obtained at least 8 weeks after the dose of PCV13 vaccine. An adult aged 19  years or older who has certain medical conditions and previously received 1 or more doses of PPSV23 vaccine should receive 1 dose of PCV13. The PCV13 vaccine dose should be obtained 1 or more years after the last PPSV23 vaccine dose.  Pneumococcal polysaccharide (PPSV23) vaccine. When PCV13 is also indicated, PCV13 should be obtained first. All adults aged 65 years and older should be immunized. An adult younger than age 65 years who has certain medical conditions should be immunized. Any person who resides in a nursing home or long-term care facility should be immunized. An adult smoker should be immunized. People with an immunocompromised condition and certain other conditions should receive both PCV13 and PPSV23 vaccines. People with human immunodeficiency virus (HIV) infection should be immunized as soon as possible after diagnosis. Immunization during chemotherapy or radiation therapy should be avoided. Routine use of PPSV23 vaccine is not recommended for American Indians, [place] Natives, or people younger than 65 years unless there are medical conditions that require PPSV23 vaccine. When indicated, people who have unknown immunization and have no record of immunization should receive PPSV23 vaccine. One-time revaccination 5 years after the first dose of PPSV23 is recommended for people aged 19-64 years who have chronic kidney failure, nephrotic syndrome, asplenia, or immunocompromised conditions. People who received 1-2 doses of PPSV23 before age 65 years should receive another dose of PPSV23 vaccine at age 65 years or later if at least 5 years have passed since the previous dose. Doses of PPSV23 are not needed for people immunized with PPSV23 at or after age 65 years.  Meningococcal vaccine. Adults with asplenia or persistent complement component deficiencies should receive 2 doses of quadrivalent meningococcal conjugate (MenACWY-D) vaccine. The doses should be obtained at least 2 months apart.  Microbiologists working with certain meningococcal bacteria, military recruits, people at risk during an outbreak, and people who travel to or live in countries with a high rate of meningitis should be immunized. A first-year college student up through age   21 years who is living in a residence hall should receive a dose if she did not receive a dose on or after her 16th birthday. Adults who have certain high-risk conditions should receive one or more doses of vaccine.  Hepatitis A vaccine. Adults who wish to be protected from this disease, have certain high-risk conditions, work with hepatitis A-infected animals, work in hepatitis A research labs, or travel to or work in countries with a high rate of hepatitis A should be immunized. Adults who were previously unvaccinated and who anticipate close contact with an international adoptee during the first 60 days after arrival in the [place] from a country with a high rate of hepatitis A should be immunized.  Hepatitis B vaccine. Adults who wish to be protected from this disease, have certain high-risk conditions, may be exposed to blood or other infectious body fluids, are household contacts or sex partners of hepatitis B positive people, are clients or workers in certain care facilities, or travel to or work in countries with a high rate of hepatitis B should be immunized.  Haemophilus influenzae type b (Hib) vaccine. A previously unvaccinated person with asplenia or sickle cell disease or having a scheduled splenectomy should receive 1 dose of Hib vaccine. Regardless of previous immunization, a recipient of a hematopoietic stem cell transplant should receive a 3-dose series 6-12 months after her successful transplant. Hib vaccine is not recommended for adults with HIV infection. Preventive Services / Frequency Ages 64 to 68 years  Blood pressure check.** / Every 1 to 2 years.  Lipid and cholesterol check.** / Every 5 years beginning at age  22.  Clinical breast exam.** / Every 3 years for women in their 88s and 53s.  BRCA-related cancer risk assessment.** / For women who have family members with a BRCA-related cancer (breast, ovarian, tubal, or peritoneal cancers).  Pap test.** / Every 2 years from ages 90 through 51. Every 3 years starting at age 21 through age 56 or 3 with a history of 3 consecutive normal Pap tests.  HPV screening.** / Every 3 years from ages 24 through ages 1 to 46 with a history of 3 consecutive normal Pap tests.  Hepatitis C blood test.** / For any individual with known risks for hepatitis C.  Skin self-exam. / Monthly.  Influenza vaccine. / Every year.  Tetanus, diphtheria, and acellular pertussis (Tdap, Td) vaccine.** / Consult your health care provider. Pregnant women should receive 1 dose of Tdap vaccine during each pregnancy. 1 dose of Td every 10 years.  Varicella vaccine.** / Consult your health care provider. Pregnant females who do not have evidence of immunity should receive the first dose after pregnancy.  HPV vaccine. / 3 doses over 6 months, if 72 and younger. The vaccine is not recommended for use in pregnant females. However, pregnancy testing is not needed before receiving a dose.  Measles, mumps, rubella (MMR) vaccine.** / You need at least 1 dose of MMR if you were born in 1957 or later. You may also need a 2nd dose. For females of childbearing age, rubella immunity should be determined. If there is no evidence of immunity, females who are not pregnant should be vaccinated. If there is no evidence of immunity, females who are pregnant should delay immunization until after pregnancy.  Pneumococcal 13-valent conjugate (PCV13) vaccine.** / Consult your health care provider.  Pneumococcal polysaccharide (PPSV23) vaccine.** / 1 to 2 doses if you smoke cigarettes or if you have certain conditions.  Meningococcal vaccine.** /  1 dose if you are age 19 to 21 years and a first-year college  student living in a residence hall, or have one of several medical conditions, you need to get vaccinated against meningococcal disease. You may also need additional booster doses.  Hepatitis A vaccine.** / Consult your health care provider.  Hepatitis B vaccine.** / Consult your health care provider.  Haemophilus influenzae type b (Hib) vaccine.** / Consult your health care provider. Ages 40 to 64 years  Blood pressure check.** / Every 1 to 2 years.  Lipid and cholesterol check.** / Every 5 years beginning at age 20 years.  Lung cancer screening. / Every year if you are aged 55-80 years and have a 30-pack-year history of smoking and currently smoke or have quit within the past 15 years. Yearly screening is stopped once you have quit smoking for at least 15 years or develop a health problem that would prevent you from having lung cancer treatment.  Clinical breast exam.** / Every year after age 40 years.  BRCA-related cancer risk assessment.** / For women who have family members with a BRCA-related cancer (breast, ovarian, tubal, or peritoneal cancers).  Mammogram.** / Every year beginning at age 40 years and continuing for as long as you are in good health. Consult with your health care provider.  Pap test.** / Every 3 years starting at age 30 years through age 65 or 70 years with a history of 3 consecutive normal Pap tests.  HPV screening.** / Every 3 years from ages 30 years through ages 65 to 70 years with a history of 3 consecutive normal Pap tests.  Fecal occult blood test (FOBT) of stool. / Every year beginning at age 50 years and continuing until age 75 years. You may not need to do this test if you get a colonoscopy every 10 years.  Flexible sigmoidoscopy or colonoscopy.** / Every 5 years for a flexible sigmoidoscopy or every 10 years for a colonoscopy beginning at age 50 years and continuing until age 75 years.  Hepatitis C blood test.** / For all people born from 1945 through  1965 and any individual with known risks for hepatitis C.  Skin self-exam. / Monthly.  Influenza vaccine. / Every year.  Tetanus, diphtheria, and acellular pertussis (Tdap/Td) vaccine.** / Consult your health care provider. Pregnant women should receive 1 dose of Tdap vaccine during each pregnancy. 1 dose of Td every 10 years.  Varicella vaccine.** / Consult your health care provider. Pregnant females who do not have evidence of immunity should receive the first dose after pregnancy.  Zoster vaccine.** / 1 dose for adults aged 60 years or older.  Measles, mumps, rubella (MMR) vaccine.** / You need at least 1 dose of MMR if you were born in 1957 or later. You may also need a 2nd dose. For females of childbearing age, rubella immunity should be determined. If there is no evidence of immunity, females who are not pregnant should be vaccinated. If there is no evidence of immunity, females who are pregnant should delay immunization until after pregnancy.  Pneumococcal 13-valent conjugate (PCV13) vaccine.** / Consult your health care provider.  Pneumococcal polysaccharide (PPSV23) vaccine.** / 1 to 2 doses if you smoke cigarettes or if you have certain conditions.  Meningococcal vaccine.** / Consult your health care provider.  Hepatitis A vaccine.** / Consult your health care provider.  Hepatitis B vaccine.** / Consult your health care provider.  Haemophilus influenzae type b (Hib) vaccine.** / Consult your health care provider. Ages 65   years and over  Blood pressure check.** / Every 1 to 2 years.  Lipid and cholesterol check.** / Every 5 years beginning at age 22 years.  Lung cancer screening. / Every year if you are aged 73-80 years and have a 30-pack-year history of smoking and currently smoke or have quit within the past 15 years. Yearly screening is stopped once you have quit smoking for at least 15 years or develop a health problem that would prevent you from having lung cancer  treatment.  Clinical breast exam.** / Every year after age 4 years.  BRCA-related cancer risk assessment.** / For women who have family members with a BRCA-related cancer (breast, ovarian, tubal, or peritoneal cancers).  Mammogram.** / Every year beginning at age 40 years and continuing for as long as you are in good health. Consult with your health care provider.  Pap test.** / Every 3 years starting at age 9 years through age 34 or 91 years with 3 consecutive normal Pap tests. Testing can be stopped between 65 and 70 years with 3 consecutive normal Pap tests and no abnormal Pap or HPV tests in the past 10 years.  HPV screening.** / Every 3 years from ages 57 years through ages 64 or 45 years with a history of 3 consecutive normal Pap tests. Testing can be stopped between 65 and 70 years with 3 consecutive normal Pap tests and no abnormal Pap or HPV tests in the past 10 years.  Fecal occult blood test (FOBT) of stool. / Every year beginning at age 15 years and continuing until age 17 years. You may not need to do this test if you get a colonoscopy every 10 years.  Flexible sigmoidoscopy or colonoscopy.** / Every 5 years for a flexible sigmoidoscopy or every 10 years for a colonoscopy beginning at age 86 years and continuing until age 71 years.  Hepatitis C blood test.** / For all people born from [birth year] through 1964 and any individual with known risks for hepatitis C.  Osteoporosis screening.** / A one-time screening for women ages 83 years and over and women at risk for fractures or osteoporosis.  Skin self-exam. / Monthly.  Influenza vaccine. / Every year.  Tetanus, diphtheria, and acellular pertussis (Tdap/Td) vaccine.** / 1 dose of Td every 10 years.  Varicella vaccine.** / Consult your health care provider.  Zoster vaccine.** / 1 dose for adults aged 61 years or older.  Pneumococcal 13-valent conjugate (PCV13) vaccine.** / Consult your health care provider.  Pneumococcal  polysaccharide (PPSV23) vaccine.** / 1 dose for all adults aged 28 years and older.  Meningococcal vaccine.** / Consult your health care provider.  Hepatitis A vaccine.** / Consult your health care provider.  Hepatitis B vaccine.** / Consult your health care provider.  Haemophilus influenzae type b (Hib) vaccine.** / Consult your health care provider. ** Family history and personal history of risk and conditions may change your health care provider's recommendations. Document Released: 02/08/2002 Document Revised: 04/29/2014 Document Reviewed: 05/10/2011 [facility] Patient Information 2015 [place], [place]. This information is not intended to replace advice given to you by your health care provider. Make sure you discuss any questions you have with your health care provider.

## 2015-04-22 NOTE — Progress Notes (Signed)
Pre visit review using our clinic review tool, if applicable. No additional management support is needed unless otherwise documented below in the visit note. 

## 2015-04-22 NOTE — Progress Notes (Signed)
Subjective:     Tracy Thompson is a 64 y.o. female and is here for a comprehensive physical exam. The patient reports no problems.--- she is also here to f/u htn , cholesterol and hx breast ca  History   Social History  . Marital Status: Married    Spouse Name: N/A  . Number of Children: N/A  . Years of Education: N/A   Occupational History  . retired Pharmacist, hospital    Social History Main Topics  . Smoking status: Never Smoker   . Smokeless tobacco: Never Used  . Alcohol Use: No  . Drug Use: No  . Sexual Activity:    Partners: Male   Other Topics Concern  . Not on file   Social History Narrative   Exercise--- walking , weights   Health Maintenance  Topic Date Due  . PAP SMEAR  02/10/2015  . HIV Screening  04/21/2016 (Originally 09/09/1966)  . INFLUENZA VACCINE  07/28/2015  . MAMMOGRAM  03/16/2017  . COLONOSCOPY  07/25/2022  . TETANUS/TDAP  04/17/2023  . ZOSTAVAX  Completed    The following portions of the patient's history were reviewed and updated as appropriate:  She  has a past medical history of Breast cancer (1994); Menopause; Hypertension; and Factor 5 Leiden mutation, heterozygous. She  does not have any pertinent problems on file. She  has past surgical history that includes Breast lumpectomy (Right, 1994); varicose veins (Bilateral, 2013); and Cholecystectomy (06/20/2002). Her family history includes COPD in her brother and mother; Cancer in her father and mother; Colon cancer in her mother; Deep vein thrombosis in her father; Dementia in her mother; Diabetes in her father; Hepatitis in her mother; Hepatitis C in her brother; Hyperlipidemia in her mother; Hypertension in her father and mother; Liver disease in an other family member; Mental illness in her brother; Prostate cancer in her father; Pulmonary embolism in her father; Schizophrenia in her brother; Stroke in her father and mother. She  reports that she has never smoked. She has never used smokeless tobacco. She  reports that she does not drink alcohol or use illicit drugs. She has a current medication list which includes the following prescription(s): aspirin, bisoprolol-hydrochlorothiazide, coenzyme q10, hydrochlorothiazide, pitavastatin calcium, and raloxifene. Current Outpatient Prescriptions on File Prior to Visit  Medication Sig Dispense Refill  . aspirin 81 MG tablet Take 81 mg by mouth daily.      . Coenzyme Q10 200 MG capsule Take 200 mg by mouth daily.     No current facility-administered medications on file prior to visit.   She has No Known Allergies..  Review of Systems Review of Systems  Constitutional: Negative for activity change, appetite change and fatigue.  HENT: Negative for hearing loss, congestion, tinnitus and ear discharge.  dentist q8mEyes: Negative for visual disturbance (see optho q1y -- vision corrected to 20/20 with glasses).  Respiratory: Negative for cough, chest tightness and shortness of breath.   Cardiovascular: Negative for chest pain, palpitations and leg swelling.  Gastrointestinal: Negative for abdominal pain, diarrhea, constipation and abdominal distention.  Genitourinary: Negative for urgency, frequency, decreased urine volume and difficulty urinating.  Musculoskeletal: Negative for back pain, arthralgias and gait problem.  Skin: Negative for color change, pallor and rash.  Neurological: Negative for dizziness, light-headedness, numbness and headaches.  Hematological: Negative for adenopathy. Does not bruise/bleed easily.  Psychiatric/Behavioral: Negative for suicidal ideas, confusion, sleep disturbance, self-injury, dysphoric mood, decreased concentration and agitation.       Objective:    BP 122/70  mmHg  Pulse 63  Temp(Src) 98.1 F (36.7 C) (Oral)  Ht '5\' 6"'$  (1.676 m)  Wt 206 lb (93.441 kg)  BMI 33.27 kg/m2  SpO2 95% General appearance: alert, cooperative, appears stated age and no distress Head: Normocephalic, without obvious abnormality,  atraumatic Eyes: conjunctivae/corneas clear. PERRL, EOM's intact. Fundi benign. Ears: normal TM's and external ear canals both ears Nose: Nares normal. Septum midline. Mucosa normal. No drainage or sinus tenderness. Throat: lips, mucosa, and tongue normal; teeth and gums normal Neck: no adenopathy, no carotid bruit, no JVD, supple, symmetrical, trachea midline and thyroid not enlarged, symmetric, no tenderness/mass/nodules Back: symmetric, no curvature. ROM normal. No CVA tenderness. Lungs: clear to auscultation bilaterally Breasts: R breast-- + scar from lumpectomy --otherwise normal Heart: regular rate and rhythm, S1, S2 normal, no murmur, click, rub or gallop Abdomen: soft, non-tender; bowel sounds normal; no masses,  no organomegaly Pelvic: cervix normal in appearance, external genitalia normal, no adnexal masses or tenderness, no cervical motion tenderness, rectovaginal septum normal, uterus normal size, shape, and consistency, vagina normal without discharge and pap done--  Rectal-- heme neg brown stool Extremities: extremities normal, atraumatic, no cyanosis or edema Pulses: 2+ and symmetric Skin: Skin color, texture, turgor normal. No rashes or lesions Lymph nodes: Cervical, supraclavicular, and axillary nodes normal. Neurologic: Alert and oriented X 3, normal strength and tone. Normal symmetric reflexes. Normal coordination and gait Psych- no depression no anxiety      Assessment:    Healthy female exam.     Plan:    ghm utd Check labs See After Visit Summary for Counseling Recommendations    1. Postmenopausal   - raloxifene (EVISTA) 60 MG tablet; TAKE 1 TABLET BY MOUTH ONCE DAILY  Dispense: 30 tablet; Refill: 11  2. Essential hypertension stable - bisoprolol-hydrochlorothiazide (ZIAC) 10-6.25 MG per tablet; take 1 tablet by mouth once daily  Dispense: 90 tablet; Refill: 3 - hydrochlorothiazide (HYDRODIURIL) 25 MG tablet; Take 1 tablet (25 mg total) by mouth daily.   Dispense: 30 tablet; Refill: 11 - Basic metabolic panel - CBC with Differential/Platelet - Hepatic function panel - Lipid panel - Microalbumin / creatinine urine ratio - POCT urinalysis dipstick - TSH  3. Hyperlipidemia Check labs - Pitavastatin Calcium (LIVALO) 2 MG TABS; Take 1 tablet (2 mg total) by mouth at bedtime.  Dispense: 30 tablet; Refill: 5 - Basic metabolic panel - CBC with Differential/Platelet - Hepatic function panel - Lipid panel - Microalbumin / creatinine urine ratio - POCT urinalysis dipstick - TSH  4. Preventative health care   - Basic metabolic panel - CBC with Differential/Platelet - Hepatic function panel - Lipid panel - Microalbumin / creatinine urine ratio - POCT urinalysis dipstick - TSH

## 2015-04-23 LAB — CYTOLOGY - PAP

## 2015-09-03 ENCOUNTER — Telehealth: Payer: Self-pay | Admitting: Family Medicine

## 2015-09-03 DIAGNOSIS — E785 Hyperlipidemia, unspecified: Secondary | ICD-10-CM

## 2015-09-03 NOTE — Telephone Encounter (Signed)
Relation to LS:LHTD Call back [place]   Reason for call:  As per 04/22/15 AVS Return in about 6 months (around 10/22/2015), or if symptoms worsen or fail to improve. Patient stated it should be labs only please advise

## 2015-09-04 NOTE — Telephone Encounter (Signed)
Orders are in. Please schedule a lab only apt.       KP

## 2015-09-04 NOTE — Telephone Encounter (Signed)
Dr.Lowne would you like to see Mrs.Courville or do labs only. Please advise     KP

## 2015-09-04 NOTE — Telephone Encounter (Signed)
Her bp has been good so it can be lab only with 6 mon f/u for cpe and labs

## 2015-10-16 ENCOUNTER — Ambulatory Visit (INDEPENDENT_AMBULATORY_CARE_PROVIDER_SITE_OTHER): Payer: BC Managed Care – PPO

## 2015-10-16 DIAGNOSIS — Z23 Encounter for immunization: Secondary | ICD-10-CM | POA: Diagnosis not present

## 2015-10-23 ENCOUNTER — Encounter: Payer: Self-pay | Admitting: Family Medicine

## 2015-10-23 ENCOUNTER — Ambulatory Visit (INDEPENDENT_AMBULATORY_CARE_PROVIDER_SITE_OTHER): Payer: BC Managed Care – PPO | Admitting: Family Medicine

## 2015-10-23 VITALS — BP 132/80 | HR 69 | Temp 98.2°F | Ht 66.0 in | Wt 205.8 lb

## 2015-10-23 DIAGNOSIS — E785 Hyperlipidemia, unspecified: Secondary | ICD-10-CM

## 2015-10-23 DIAGNOSIS — I10 Essential (primary) hypertension: Secondary | ICD-10-CM | POA: Diagnosis not present

## 2015-10-23 LAB — LIPID PANEL
CHOLESTEROL: 148 mg/dL (ref 0–200)
HDL: 51 mg/dL (ref >39.00)
LDL CALC: 77 mg/dL (ref 0–99)
NONHDL: 96.98
Total CHOL/HDL Ratio: 3
Triglycerides: 98 mg/dL (ref 0.0–149.0)
VLDL: 19.6 mg/dL (ref 0.0–40.0)

## 2015-10-23 LAB — COMPREHENSIVE METABOLIC PANEL
ALT: 17 U/L (ref 0–35)
AST: 20 U/L (ref 0–37)
Albumin: 4.2 g/dL (ref 3.5–5.2)
Alkaline Phosphatase: 56 U/L (ref 39–117)
BUN: 13 mg/dL (ref 6–23)
CHLORIDE: 104 meq/L (ref 96–112)
CO2: 25 mEq/L (ref 19–32)
Calcium: 9.7 mg/dL (ref 8.4–10.5)
Creatinine, Ser: 0.9 mg/dL (ref 0.40–1.20)
GFR: 67.19 mL/min (ref >60.00)
GLUCOSE: 102 mg/dL — AB (ref 70–99)
POTASSIUM: 3.6 meq/L (ref 3.5–5.1)
SODIUM: 142 meq/L (ref 135–145)
Total Bilirubin: 0.4 mg/dL (ref 0.2–1.2)
Total Protein: 7.6 g/dL (ref 6.0–8.3)

## 2015-10-23 MED ORDER — ATORVASTATIN CALCIUM 10 MG PO TABS: 10.0000 mg | ORAL_TABLET | Freq: Every day | ORAL | Status: DC

## 2015-10-23 NOTE — Progress Notes (Signed)
Pre visit review using our clinic review tool, if applicable. No additional management support is needed unless otherwise documented below in the visit note. 

## 2015-10-23 NOTE — Progress Notes (Signed)
Patient ID: Tracy Thompson, female    DOB: [birth year]  Age: 64 y.o. MRN: [id number]    Subjective:  Subjective HPI Tracy Thompson presents for f/u cholesterol and bp.  No complaints.    Review of Systems  Constitutional: Negative for diaphoresis, appetite change, fatigue and unexpected weight change.  Eyes: Negative for pain, redness and visual disturbance.  Respiratory: Negative for cough, chest tightness, shortness of breath and wheezing.   Cardiovascular: Negative for chest pain, palpitations and leg swelling.  Endocrine: Negative for cold intolerance, heat intolerance, polydipsia, polyphagia and polyuria.  Genitourinary: Negative for dysuria, frequency and difficulty urinating.  Neurological: Negative for dizziness, light-headedness, numbness and headaches.    History Past Medical History  Diagnosis Date  . Breast cancer ([place]) 1994    s/p chemo 8/94-1/95, radiation 8/94-11/94  . Menopause     chemo induced  . Hypertension   . Factor 5 Leiden mutation, heterozygous [facility])     also has Factor 8 problems    She has past surgical history that includes Breast lumpectomy (Right, 1994); varicose veins (Bilateral, 2013); and Cholecystectomy (06/20/2002).   Her family history includes COPD in her brother and mother; Cancer in her father and mother; Colon cancer in her mother; Deep vein thrombosis in her father; Dementia in her mother; Diabetes in her father; Hepatitis in her mother; Hepatitis C in her brother; Hyperlipidemia in her mother; Hypertension in her father and mother; Liver disease in an other family member; Mental illness in her brother; Prostate cancer in her father; Pulmonary embolism in her father; Schizophrenia in her brother; Stroke in her father and mother.She reports that she has never smoked. She has never used smokeless tobacco. She reports that she does not drink alcohol or use illicit drugs.  Current Outpatient Prescriptions on File Prior to Visit  Medication Sig  Dispense Refill  . aspirin 81 MG tablet Take 81 mg by mouth daily.      . bisoprolol-hydrochlorothiazide (ZIAC) 10-6.25 MG per tablet take 1 tablet by mouth once daily 90 tablet 3  . Coenzyme Q10 200 MG capsule Take 200 mg by mouth daily.    . hydrochlorothiazide (HYDRODIURIL) 25 MG tablet Take 1 tablet (25 mg total) by mouth daily. 30 tablet 11  . raloxifene (EVISTA) 60 MG tablet TAKE 1 TABLET BY MOUTH ONCE DAILY 30 tablet 11   No current facility-administered medications on file prior to visit.     Objective:  Objective Physical Exam  Constitutional: She is oriented to person, place, and time. She appears well-developed and well-nourished.  HENT:  Head: Normocephalic and atraumatic.  Eyes: Conjunctivae and EOM are normal.  Neck: Normal range of motion. Neck supple. No JVD present. Carotid bruit is not present. No thyromegaly present.  Cardiovascular: Normal rate, regular rhythm and normal heart sounds.   No murmur heard. Pulmonary/Chest: Effort normal and breath sounds normal. No respiratory distress. She has no wheezes. She has no rales. She exhibits no tenderness.  Musculoskeletal: She exhibits no edema.  Neurological: She is alert and oriented to person, place, and time.  Psychiatric: She has a normal mood and affect.  Nursing note and vitals reviewed.  BP 132/80 mmHg  Pulse 69  Temp(Src) 98.2 F (36.8 C) (Oral)  Ht 5' 6"  (1.676 m)  Wt 205 lb 12.8 oz (93.35 kg)  BMI 33.23 kg/m2  SpO2 97% Wt Readings from Last 3 Encounters:  10/23/15 205 lb 12.8 oz (93.35 kg)  04/22/15 206 lb (93.441 kg)  08/23/14 206 lb (  93.441 kg)     Lab Results  Component Value Date   WBC 6.4 04/22/2015   HGB 14.0 04/22/2015   HCT 41.8 04/22/2015   PLT 304.0 04/22/2015   GLUCOSE 98 04/22/2015   CHOL 128 04/22/2015   TRIG 91.0 04/22/2015   HDL 51.70 04/22/2015   LDLDIRECT 135.7 02/11/2012   LDLCALC 58 04/22/2015   ALT 18 04/22/2015   AST 20 04/22/2015   NA 140 04/22/2015   K 3.6  04/22/2015   CL 103 04/22/2015   CREATININE 0.86 04/22/2015   BUN 11 04/22/2015   CO2 28 04/22/2015   TSH 3.76 04/22/2015   HGBA1C 6.5 10/03/2013   MICROALBUR 0.7 04/22/2015    No results found.   Assessment & Plan:  Plan I have discontinued Ms. Branam's Pitavastatin Calcium. I am also having her start on atorvastatin. Additionally, I am having her maintain her aspirin, Coenzyme Q10, raloxifene, bisoprolol-hydrochlorothiazide, and hydrochlorothiazide.  Meds ordered this encounter  Medications  . atorvastatin (LIPITOR) 10 MG tablet    Sig: Take 1 tablet (10 mg total) by mouth daily.    Dispense:  30 tablet    Refill:  2    Problem List Items Addressed This Visit    Essential hypertension    con't ziac bp stable       Relevant Medications   atorvastatin (LIPITOR) 10 MG tablet    Other Visit Diagnoses    Hyperlipidemia    -  Primary    Relevant Medications    atorvastatin (LIPITOR) 10 MG tablet    Other Relevant Orders    Comp Met (CMET)    Lipid panel       Follow-up: Return in about 3 months (around 01/23/2016), or if symptoms worsen or fail to improve, for hypertension, hyperlipidemia.  Tracy Koyanagi, DO

## 2015-10-23 NOTE — Patient Instructions (Signed)
Hypertension Hypertension, commonly called high blood pressure, is when the force of blood pumping through your arteries is too strong. Your arteries are the blood vessels that carry blood from your heart throughout your body. A blood pressure reading consists of a higher number over a lower number, such as 110/72. The higher number (systolic) is the pressure inside your arteries when your heart pumps. The lower number (diastolic) is the pressure inside your arteries when your heart relaxes. Ideally you want your blood pressure below 120/80. Hypertension forces your heart to work harder to pump blood. Your arteries may become narrow or stiff. Having untreated or uncontrolled hypertension can cause heart attack, stroke, kidney disease, and other problems. RISK FACTORS Some risk factors for high blood pressure are controllable. Others are not.  Risk factors you cannot control include:   Race. You may be at higher risk if you are African American.  Age. Risk increases with age.  Gender. Men are at higher risk than women before age 45 years. After age 65, women are at higher risk than men. Risk factors you can control include:  Not getting enough exercise or physical activity.  Being overweight.  Getting too much fat, sugar, calories, or salt in your diet.  Drinking too much alcohol. SIGNS AND SYMPTOMS Hypertension does not usually cause signs or symptoms. Extremely high blood pressure (hypertensive crisis) may cause headache, anxiety, shortness of breath, and nosebleed. DIAGNOSIS To check if you have hypertension, your health care provider will measure your blood pressure while you are seated, with your arm held at the level of your heart. It should be measured at least twice using the same arm. Certain conditions can cause a difference in blood pressure between your right and left arms. A blood pressure reading that is higher than normal on one occasion does not mean that you need treatment. If  it is not clear whether you have high blood pressure, you may be asked to return on a different day to have your blood pressure checked again. Or, you may be asked to monitor your blood pressure at home for 1 or more weeks. TREATMENT Treating high blood pressure includes making lifestyle changes and possibly taking medicine. Living a healthy lifestyle can help lower high blood pressure. You may need to change some of your habits. Lifestyle changes may include:  Following the DASH diet. This diet is high in fruits, vegetables, and whole grains. It is low in salt, red meat, and added sugars.  Keep your sodium intake below 2,300 mg per day.  Getting at least 30-45 minutes of aerobic exercise at least 4 times per week.  Losing weight if necessary.  Not smoking.  Limiting alcoholic beverages.  Learning ways to reduce stress. Your health care provider may prescribe medicine if lifestyle changes are not enough to get your blood pressure under control, and if one of the following is true:  You are 18-59 years of age and your systolic blood pressure is above 140.  You are 60 years of age or older, and your systolic blood pressure is above 150.  Your diastolic blood pressure is above 90.  You have diabetes, and your systolic blood pressure is over 140 or your diastolic blood pressure is over 90.  You have kidney disease and your blood pressure is above 140/90.  You have heart disease and your blood pressure is above 140/90. Your personal target blood pressure may vary depending on your medical conditions, your age, and other factors. HOME CARE INSTRUCTIONS    Have your blood pressure rechecked as directed by your health care provider.   Take medicines only as directed by your health care provider. Follow the directions carefully. Blood pressure medicines must be taken as prescribed. The medicine does not work as well when you skip doses. Skipping doses also puts you at risk for  problems.  Do not smoke.   Monitor your blood pressure at home as directed by your health care provider. SEEK MEDICAL CARE IF:   You think you are having a reaction to medicines taken.  You have recurrent headaches or feel dizzy.  You have swelling in your ankles.  You have trouble with your vision. SEEK IMMEDIATE MEDICAL CARE IF:  You develop a severe headache or confusion.  You have unusual weakness, numbness, or feel faint.  You have severe chest or abdominal pain.  You vomit repeatedly.  You have trouble breathing. MAKE SURE YOU:   Understand these instructions.  Will watch your condition.  Will get help right away if you are not doing well or get worse.   This information is not intended to replace advice given to you by your health care provider. Make sure you discuss any questions you have with your health care provider.   Document Released: 12/13/2005 Document Revised: 04/29/2015 Document Reviewed: 10/05/2013 Elsevier Interactive Patient Education 2016 [employer]  

## 2015-10-23 NOTE — Assessment & Plan Note (Signed)
con't ziac bp stable

## 2016-01-27 ENCOUNTER — Encounter: Payer: Self-pay | Admitting: Family Medicine

## 2016-01-27 ENCOUNTER — Ambulatory Visit (INDEPENDENT_AMBULATORY_CARE_PROVIDER_SITE_OTHER): Payer: BC Managed Care – PPO | Admitting: Family Medicine

## 2016-01-27 VITALS — BP 138/88 | HR 76 | Temp 98.1°F | Ht 66.0 in | Wt 208.8 lb

## 2016-01-27 DIAGNOSIS — E785 Hyperlipidemia, unspecified: Secondary | ICD-10-CM | POA: Diagnosis not present

## 2016-01-27 DIAGNOSIS — I10 Essential (primary) hypertension: Secondary | ICD-10-CM

## 2016-01-27 MED ORDER — ATORVASTATIN CALCIUM 10 MG PO TABS: 10.0000 mg | ORAL_TABLET | Freq: Every day | ORAL | Status: DC

## 2016-01-27 NOTE — Progress Notes (Signed)
Pre visit review using our clinic review tool, if applicable. No additional management support is needed unless otherwise documented below in the visit note. 

## 2016-01-27 NOTE — Progress Notes (Signed)
Subjective:    Patient ID: Tracy Thompson, female    DOB: [birth year], 65 y.o.   MRN: [id number]  Chief Complaint  Patient presents with  . Hypertension    Pt here for follow up  . Hyperlipidemia    Pt here for follow up, non-fasting    HPI Patient is in today for f/u cholesterol and htn.  No complaints.    Past Medical History  Diagnosis Date  . Breast cancer ([place]) 1994    s/p chemo 8/94-1/95, radiation 8/94-11/94  . Menopause     chemo induced  . Hypertension   . Factor 5 Leiden mutation, heterozygous [facility])     also has Factor 8 problems    Past Surgical History  Procedure Laterality Date  . Breast lumpectomy Right 1994    w/ radiation and chemotherapy following lumpectomy  . Varicose veins Bilateral 2013    laser surgery   Dr. Jones Skene  . Cholecystectomy  06/20/2002    Laparoscopic cholestectomy    Family History  Problem Relation Age of Onset  . Cancer Mother     Bladder CA, colon  . Colon cancer Mother   . Stroke Mother   . Hypertension Mother   . Hyperlipidemia Mother   . Hepatitis Mother     hep A  . COPD Mother   . Dementia Mother   . Hypertension Father   . Prostate cancer Father   . Diabetes Father   . Stroke Father   . Pulmonary embolism Father   . Deep vein thrombosis Father   . Cancer Father     prostate cancer  . Schizophrenia Brother   . Mental illness Brother   . COPD Brother   . Hepatitis C Brother   . Liver disease      Social History   Social History  . Marital Status: Married    Spouse Name: N/A  . Number of Children: N/A  . Years of Education: N/A   Occupational History  . retired Pharmacist, hospital    Social History Main Topics  . Smoking status: Never Smoker   . Smokeless tobacco: Never Used  . Alcohol Use: No  . Drug Use: No  . Sexual Activity:    Partners: Male   Other Topics Concern  . Not on file   Social History Narrative   Exercise--- walking , weights    Outpatient Prescriptions Prior to Visit  Medication  Sig Dispense Refill  . aspirin 81 MG tablet Take 81 mg by mouth daily.      . bisoprolol-hydrochlorothiazide (ZIAC) 10-6.25 MG per tablet take 1 tablet by mouth once daily 90 tablet 3  . Coenzyme Q10 200 MG capsule Take 200 mg by mouth daily.    . hydrochlorothiazide (HYDRODIURIL) 25 MG tablet Take 1 tablet (25 mg total) by mouth daily. 30 tablet 11  . raloxifene (EVISTA) 60 MG tablet TAKE 1 TABLET BY MOUTH ONCE DAILY 30 tablet 11  . atorvastatin (LIPITOR) 10 MG tablet Take 1 tablet (10 mg total) by mouth daily. 30 tablet 2   No facility-administered medications prior to visit.    No Known Allergies  Review of Systems  Constitutional: Negative for fever, chills and malaise/fatigue.  HENT: Negative for congestion and hearing loss.   Eyes: Negative for discharge.  Respiratory: Negative for cough, sputum production and shortness of breath.   Cardiovascular: Negative for chest pain, palpitations and leg swelling.  Gastrointestinal: Negative for heartburn, nausea, vomiting, abdominal pain, diarrhea, constipation and blood in  stool.  Genitourinary: Negative for dysuria, urgency, frequency and hematuria.  Musculoskeletal: Negative for myalgias, back pain and falls.  Skin: Negative for rash.  Neurological: Negative for dizziness, sensory change, loss of consciousness, weakness and headaches.  Endo/Heme/Allergies: Negative for environmental allergies. Does not bruise/bleed easily.  Psychiatric/Behavioral: Negative for depression and suicidal ideas. The patient is not nervous/anxious and does not have insomnia.        Objective:    Physical Exam  Constitutional: She is oriented to person, place, and time. She appears well-developed and well-nourished.  HENT:  Head: Normocephalic and atraumatic.  Eyes: Conjunctivae and EOM are normal.  Neck: Normal range of motion. Neck supple. No JVD present. Carotid bruit is not present. No thyromegaly present.  Cardiovascular: Normal rate, regular rhythm  and normal heart sounds.   No murmur heard. Pulmonary/Chest: Effort normal and breath sounds normal. No respiratory distress. She has no wheezes. She has no rales. She exhibits no tenderness.  Musculoskeletal: She exhibits no edema.  Neurological: She is alert and oriented to person, place, and time.  Psychiatric: She has a normal mood and affect. Her behavior is normal. Judgment and thought content normal.  Nursing note and vitals reviewed.   BP 138/88 mmHg  Pulse 76  Temp(Src) 98.1 F (36.7 C) (Oral)  Ht 5' 6"  (1.676 m)  Wt 208 lb 12.8 oz (94.711 kg)  BMI 33.72 kg/m2  SpO2 98% Wt Readings from Last 3 Encounters:  01/27/16 208 lb 12.8 oz (94.711 kg)  10/23/15 205 lb 12.8 oz (93.35 kg)  04/22/15 206 lb (93.441 kg)     Lab Results  Component Value Date   WBC 6.4 04/22/2015   HGB 14.0 04/22/2015   HCT 41.8 04/22/2015   PLT 304.0 04/22/2015   GLUCOSE 102* 10/23/2015   CHOL 148 10/23/2015   TRIG 98.0 10/23/2015   HDL 51.00 10/23/2015   LDLDIRECT 135.7 02/11/2012   LDLCALC 77 10/23/2015   ALT 17 10/23/2015   AST 20 10/23/2015   NA 142 10/23/2015   K 3.6 10/23/2015   CL 104 10/23/2015   CREATININE 0.90 10/23/2015   BUN 13 10/23/2015   CO2 25 10/23/2015   TSH 3.76 04/22/2015   HGBA1C 6.5 10/03/2013   MICROALBUR 0.7 04/22/2015    Lab Results  Component Value Date   TSH 3.76 04/22/2015   Lab Results  Component Value Date   WBC 6.4 04/22/2015   HGB 14.0 04/22/2015   HCT 41.8 04/22/2015   MCV 91.0 04/22/2015   PLT 304.0 04/22/2015   Lab Results  Component Value Date   NA 142 10/23/2015   K 3.6 10/23/2015   CO2 25 10/23/2015   GLUCOSE 102* 10/23/2015   BUN 13 10/23/2015   CREATININE 0.90 10/23/2015   BILITOT 0.4 10/23/2015   ALKPHOS 56 10/23/2015   AST 20 10/23/2015   ALT 17 10/23/2015   PROT 7.6 10/23/2015   ALBUMIN 4.2 10/23/2015   CALCIUM 9.7 10/23/2015   GFR 67.19 10/23/2015   Lab Results  Component Value Date   CHOL 148 10/23/2015   Lab  Results  Component Value Date   HDL 51.00 10/23/2015   Lab Results  Component Value Date   LDLCALC 77 10/23/2015   Lab Results  Component Value Date   TRIG 98.0 10/23/2015   Lab Results  Component Value Date   CHOLHDL 3 10/23/2015   Lab Results  Component Value Date   HGBA1C 6.5 10/03/2013       Assessment & Plan:  Problem List Items Addressed This Visit      Unprioritized   Essential hypertension   Relevant Medications   atorvastatin (LIPITOR) 10 MG tablet   Other Relevant Orders   Lipid panel   Comp Met (CMET)    Other Visit Diagnoses    Hyperlipidemia    -  Primary    Relevant Medications    atorvastatin (LIPITOR) 10 MG tablet    Other Relevant Orders    Lipid panel    Comp Met (CMET)       I am having Ms. Huebsch maintain her aspirin, Coenzyme Q10, raloxifene, bisoprolol-hydrochlorothiazide, hydrochlorothiazide, and atorvastatin.  Meds ordered this encounter  Medications  . atorvastatin (LIPITOR) 10 MG tablet    Sig: Take 1 tablet (10 mg total) by mouth daily.    Dispense:  30 tablet    Refill:  [place], DO

## 2016-01-29 ENCOUNTER — Other Ambulatory Visit (INDEPENDENT_AMBULATORY_CARE_PROVIDER_SITE_OTHER): Payer: BC Managed Care – PPO

## 2016-01-29 DIAGNOSIS — E785 Hyperlipidemia, unspecified: Secondary | ICD-10-CM | POA: Diagnosis not present

## 2016-01-29 DIAGNOSIS — I10 Essential (primary) hypertension: Secondary | ICD-10-CM | POA: Diagnosis not present

## 2016-01-29 LAB — LIPID PANEL
CHOLESTEROL: 126 mg/dL (ref 0–200)
HDL: 46.6 mg/dL (ref >39.00)
LDL Cholesterol: 61 mg/dL (ref 0–99)
NonHDL: 79.21
Total CHOL/HDL Ratio: 3
Triglycerides: 89 mg/dL (ref 0.0–149.0)
VLDL: 17.8 mg/dL (ref 0.0–40.0)

## 2016-01-29 LAB — COMPREHENSIVE METABOLIC PANEL
ALBUMIN: 3.7 g/dL (ref 3.5–5.2)
ALK PHOS: 54 U/L (ref 39–117)
ALT: 18 U/L (ref 0–35)
AST: 18 U/L (ref 0–37)
BUN: 13 mg/dL (ref 6–23)
CHLORIDE: 104 meq/L (ref 96–112)
CO2: 29 mEq/L (ref 19–32)
CREATININE: 0.84 mg/dL (ref 0.40–1.20)
Calcium: 9.2 mg/dL (ref 8.4–10.5)
GFR: 72.69 mL/min (ref >60.00)
Glucose, Bld: 104 mg/dL — ABNORMAL HIGH (ref 70–99)
Potassium: 3.6 mEq/L (ref 3.5–5.1)
SODIUM: 141 meq/L (ref 135–145)
TOTAL PROTEIN: 6.8 g/dL (ref 6.0–8.3)
Total Bilirubin: 0.4 mg/dL (ref 0.2–1.2)

## 2016-04-25 ENCOUNTER — Other Ambulatory Visit: Payer: Self-pay | Admitting: Family Medicine

## 2016-05-05 ENCOUNTER — Emergency Department (HOSPITAL_COMMUNITY)
Admission: EM | Admit: 2016-05-05 | Discharge: 2016-05-05 | Disposition: A | Discharge status: Home / Self Care | Payer: BC Managed Care – PPO | Attending: Emergency Medicine | Admitting: Emergency Medicine

## 2016-05-05 ENCOUNTER — Emergency Department (HOSPITAL_COMMUNITY): Payer: BC Managed Care – PPO

## 2016-05-05 ENCOUNTER — Encounter (HOSPITAL_COMMUNITY): Payer: Self-pay | Admitting: Emergency Medicine

## 2016-05-05 DIAGNOSIS — Z862 Personal history of diseases of the blood and blood-forming organs and certain disorders involving the immune mechanism: Secondary | ICD-10-CM | POA: Insufficient documentation

## 2016-05-05 DIAGNOSIS — I10 Essential (primary) hypertension: Secondary | ICD-10-CM | POA: Diagnosis not present

## 2016-05-05 DIAGNOSIS — Z853 Personal history of malignant neoplasm of breast: Secondary | ICD-10-CM | POA: Insufficient documentation

## 2016-05-05 DIAGNOSIS — Z8742 Personal history of other diseases of the female genital tract: Secondary | ICD-10-CM | POA: Diagnosis not present

## 2016-05-05 DIAGNOSIS — Z7982 Long term (current) use of aspirin: Secondary | ICD-10-CM | POA: Diagnosis not present

## 2016-05-05 DIAGNOSIS — R0602 Shortness of breath: Secondary | ICD-10-CM | POA: Insufficient documentation

## 2016-05-05 DIAGNOSIS — E785 Hyperlipidemia, unspecified: Secondary | ICD-10-CM | POA: Diagnosis not present

## 2016-05-05 DIAGNOSIS — Z79899 Other long term (current) drug therapy: Secondary | ICD-10-CM | POA: Diagnosis not present

## 2016-05-05 DIAGNOSIS — H8110 Benign paroxysmal vertigo, unspecified ear: Secondary | ICD-10-CM

## 2016-05-05 DIAGNOSIS — R42 Dizziness and giddiness: Secondary | ICD-10-CM | POA: Diagnosis present

## 2016-05-05 LAB — CBC
HCT: 44.6 % (ref 36.0–46.0)
Hemoglobin: 14.7 g/dL (ref 12.0–15.0)
MCH: 30.6 pg (ref 26.0–34.0)
MCHC: 33 g/dL (ref 30.0–36.0)
MCV: 92.7 fL (ref 78.0–100.0)
PLATELETS: 290 10*3/uL (ref 150–400)
RBC: 4.81 MIL/uL (ref 3.87–5.11)
RDW: 14 % (ref 11.5–15.5)
WBC: 8.9 10*3/uL (ref 4.0–10.5)

## 2016-05-05 LAB — BASIC METABOLIC PANEL
Anion gap: 14 (ref 5–15)
BUN: 11 mg/dL (ref 6–20)
CALCIUM: 9.4 mg/dL (ref 8.9–10.3)
CO2: 25 mmol/L (ref 22–32)
CREATININE: 0.93 mg/dL (ref 0.44–1.00)
Chloride: 102 mmol/L (ref 101–111)
GFR calc non Af Amer: >60 mL/min (ref >60)
Glucose, Bld: 133 mg/dL — ABNORMAL HIGH (ref 65–99)
Potassium: 3.4 mmol/L — ABNORMAL LOW (ref 3.5–5.1)
Sodium: 141 mmol/L (ref 135–145)

## 2016-05-05 LAB — I-STAT TROPONIN, ED: Troponin i, poc: 0 ng/mL (ref 0.00–0.08)

## 2016-05-05 LAB — CBG MONITORING, ED: GLUCOSE-CAPILLARY: 110 mg/dL — AB (ref 65–99)

## 2016-05-05 MED ORDER — ONDANSETRON HCL 4 MG/2ML IJ SOLN
4.0000 mg | Freq: Once | INTRAMUSCULAR | Status: AC
  Administered 2016-05-05: 4 mg via INTRAVENOUS
  Filled 2016-05-05: qty 2

## 2016-05-05 MED ORDER — MECLIZINE HCL 25 MG PO TABS
12.5000 mg | ORAL_TABLET | Freq: Once | ORAL | Status: AC
  Administered 2016-05-05: 12.5 mg via ORAL
  Filled 2016-05-05: qty 1

## 2016-05-05 MED ORDER — SODIUM CHLORIDE 0.9 % IV BOLUS (SEPSIS)
1000.0000 mL | Freq: Once | INTRAVENOUS | Status: AC
  Administered 2016-05-05: 1000 mL via INTRAVENOUS

## 2016-05-05 MED ORDER — MECLIZINE HCL 25 MG PO TABS
25.0000 mg | ORAL_TABLET | Freq: Once | ORAL | Status: AC
  Administered 2016-05-05: 25 mg via ORAL
  Filled 2016-05-05: qty 1

## 2016-05-05 MED ORDER — MECLIZINE HCL 12.5 MG PO TABS: 12.5000 mg | ORAL_TABLET | Freq: Three times a day (TID) | ORAL | Status: DC | PRN

## 2016-05-05 NOTE — ED Notes (Signed)
Pt.ambulated up and down with two assit  .ask pt to keep her head  Eye level .donot look down.she did very well.

## 2016-05-05 NOTE — ED Provider Notes (Signed)
CSN: [id number]     Arrival date & time 05/05/16  0751 History   First MD Initiated Contact with Patient 05/05/16 [phone]     Chief Complaint  Patient presents with  . Dizziness     (Consider location/radiation/quality/duration/timing/severity/associated sxs/prior Treatment) HPI   Tracy Thompson is a 65 y.o F with Past medical history of vertigo, HTN, HLD, breast cancer who presents the ED today complaining of dizziness and shortness of breath. Patient states that when she woke up this morning to go to the bathroom she nearly fell over due to dizziness. She states the room was spinning around her. Patient felt like this was a typical vertigo spell so she was trying to lay down with her eyes closed to see if it resolves. However, while doing this she began to feel very short of breath and was speaking in broken sentences. Patient states her husband became concerned so brought her to the ED for further evaluation. Patient also reported associated nausea but no vomiting. Patient denies chest pain, change in her vision, weakness, facial drooping, slurred speech, syncope, diaphoresis, fevers.   Past Medical History  Diagnosis Date  . Breast cancer ([place]) 1994    s/p chemo 8/94-1/95, radiation 8/94-11/94  . Menopause     chemo induced  . Hypertension   . Factor 5 Leiden mutation, heterozygous [facility])     also has Factor 8 problems   Past Surgical History  Procedure Laterality Date  . Breast lumpectomy Right 1994    w/ radiation and chemotherapy following lumpectomy  . Varicose veins Bilateral 2013    laser surgery   Dr. Jones Skene  . Cholecystectomy  06/20/2002    Laparoscopic cholestectomy   Family History  Problem Relation Age of Onset  . Cancer Mother     Bladder CA, colon  . Colon cancer Mother   . Stroke Mother   . Hypertension Mother   . Hyperlipidemia Mother   . Hepatitis Mother     hep A  . COPD Mother   . Dementia Mother   . Hypertension Father   . Prostate cancer Father    . Diabetes Father   . Stroke Father   . Pulmonary embolism Father   . Deep vein thrombosis Father   . Cancer Father     prostate cancer  . Schizophrenia Brother   . Mental illness Brother   . COPD Brother   . Hepatitis C Brother   . Liver disease     Social History  Substance Use Topics  . Smoking status: Never Smoker   . Smokeless tobacco: Never Used  . Alcohol Use: No   OB History    No data available     Review of Systems  All other systems reviewed and are negative.     Allergies  Review of patient's allergies indicates no known allergies.  Home Medications   Prior to Admission medications   Medication Sig Start Date End Date Taking? Authorizing Provider  aspirin 81 MG tablet Take 81 mg by mouth daily.      Historical Provider, MD  atorvastatin (LIPITOR) 10 MG tablet Take 1 tablet (10 mg total) by mouth daily. 01/27/16   Rosalita Chessman Chase, DO  bisoprolol-hydrochlorothiazide [facility]) 10-6.25 MG tablet take 1 tablet by mouth once daily 04/26/16   Rosalita Chessman Chase, DO  Coenzyme Q10 200 MG capsule Take 200 mg by mouth daily.    Historical Provider, MD  hydrochlorothiazide (HYDRODIURIL) 25 MG tablet take 1 tablet  by mouth once daily 04/26/16   [name], DO  raloxifene (EVISTA) 60 MG tablet take 1 tablet by mouth once daily 04/26/16   Alferd Apa Lowne Chase, DO   BP 136/81 mmHg  Pulse 66  Temp(Src) 97.5 F (36.4 C) (Oral)  Resp 16  SpO2 98% Physical Exam  Constitutional: She is oriented to person, place, and time. She appears well-developed and well-nourished. No distress.  HENT:  Head: Normocephalic and atraumatic.  Mouth/Throat: Oropharynx is clear and moist. No oropharyngeal exudate.  Eyes: Conjunctivae and EOM are normal. Pupils are equal, round, and reactive to light. Right eye exhibits no discharge. Left eye exhibits no discharge. No scleral icterus.  Neck: Neck supple. No JVD present.  Cardiovascular: Normal rate, regular rhythm, normal heart  sounds and intact distal pulses.  Exam reveals no gallop and no friction rub.   No murmur heard. Pulmonary/Chest: Effort normal and breath sounds normal. No respiratory distress. She has no wheezes. She has no rales. She exhibits no tenderness.  Abdominal: Soft. She exhibits no distension. There is no tenderness. There is no guarding.  Musculoskeletal: Normal range of motion. She exhibits no edema.  Lymphadenopathy:    She has no cervical adenopathy.  Neurological: She is alert and oriented to person, place, and time. She has normal reflexes. No cranial nerve deficit. She exhibits normal muscle tone. Coordination normal.  Strength 5/5 throughout. No sensory deficits.  No dysmetria. No gait abnormality. No slurred speech. No pronator drift. No facial drooping.   Skin: Skin is warm and dry. No rash noted. She is not diaphoretic. No erythema. No pallor.  Psychiatric: She has a normal mood and affect. Her behavior is normal.  Nursing note and vitals reviewed.   ED Course  Procedures (including critical care time) Labs Review Labs Reviewed  BASIC METABOLIC PANEL - Abnormal; Notable for the following:    Potassium 3.4 (*)    Glucose, Bld 133 (*)    All other components within normal limits  CBG MONITORING, ED - Abnormal; Notable for the following:    Glucose-Capillary 110 (*)    All other components within normal limits  CBC  I-STAT TROPOININ, ED    Imaging Review Dg Chest 2 View  05/05/2016  CLINICAL DATA:  Shortness of breath with dizziness and nausea since early this morning. EXAM: CHEST  2 VIEW COMPARISON:  Chest radiograph 07/31/2004. FINDINGS: Mild cardiac enlargement. RIGHT breast surgery, possible axillary node dissection. No effusion or pneumothorax. Scoliosis convex LEFT upper thoracic region, convex RIGHT mid thoracic region. Marked elevation RIGHT hemidiaphragm is new from priors. No paratracheal lesions are observed. IMPRESSION: Marked elevation RIGHT hemidiaphragm is new from  2005. Mild cardiac enlargement. Postsurgical changes RIGHT breast and axilla. Electronically Signed   By: Staci Righter M.D.   On: 05/05/2016 09:05   I have personally reviewed and evaluated these images and lab results as part of my medical decision-making.   EKG Interpretation None     ED ECG REPORT   Date: 05/06/2016  Rate: 69  Rhythm: normal sinus rhythm  QRS Axis: normal  Intervals: normal  ST/T Wave abnormalities: normal, Conduction Disutrbances:none  Narrative Interpretation:   Old EKG Reviewed: unchanged  I have personally reviewed the EKG tracing and agree with the computerized printout as noted.  MDM   Final diagnoses:  BPPV (benign paroxysmal positional vertigo), unspecified laterality   65 year old female with history of vertigo, HTN, HLD presents the ED today complaining of sudden onset dizziness with associated shortness  of breath. On presentation to the ED patient states she is very dizzy and the room is spinning around her. Patient states she is currently out of her meclizine has not taken anything for her symptoms. No labored breathing noted. She is speaking in complete sentences. No hypoxia or tachycardia. Patient just states she feels like she can't catch her breath. No neuro deficits noted on exam. EKG completely unremarkable. Patient was given 25 mg of meclizine. And IV fluids. Upon reexam one hour after receiving meclizine, patient reports complete symptomatic improvement. She states her dizziness is completely gone when she is no longer short of breath. Patient did not have any chest pain. Doubt this is a PE or ACS. Doubt stroke as this is consistent with previous vertigo episodes and was completely resolved with home dose of meclizine. Chest x-ray unremarkable. Orthostatic vital signs are within normal limits. Patient was able to ambulate in the ED without difficulty. No gait abnormality noted. The patient is safe for discharge with PCP follow-up. Will refill  prescription for meclizine. Discussed this case with Dr. Alvino Chapel who agrees with treatment plan.    Dondra Spry [place], PA-C 05/06/16 [place], MD 05/07/16 [phone]

## 2016-05-05 NOTE — ED Notes (Addendum)
Pt arrives via [facility], pt reports she got up this am at 0530 and felt dizzy, pt reports she almost fell while walking to her bathroom. Pt reports sob and nausea. Pt is 99% O2 on room air, reports hx of vertigo. Received '4mg'$  zofran pta. Pt a/o x4, nad.

## 2016-05-05 NOTE — Discharge Instructions (Signed)
Benign Positional Vertigo °Vertigo is the feeling that you or your surroundings are moving when they are not. Benign positional vertigo is the most common form of vertigo. The cause of this condition is not serious (is benign). This condition is triggered by certain movements and positions (is positional). This condition can be dangerous if it occurs while you are doing something that could endanger you or others, such as driving.  °CAUSES °In many cases, the cause of this condition is not known. It may be caused by a disturbance in an area of the inner ear that helps your brain to sense movement and balance. This disturbance can be caused by a viral infection (labyrinthitis), head injury, or repetitive motion. °RISK FACTORS °This condition is more likely to develop in: °· Women. °· People who are 50 years of age or older. °SYMPTOMS °Symptoms of this condition usually happen when you move your head or your eyes in different directions. Symptoms may start suddenly, and they usually last for less than a minute. Symptoms may include: °· Loss of balance and falling. °· Feeling like you are spinning or moving. °· Feeling like your surroundings are spinning or moving. °· Nausea and vomiting. °· Blurred vision. °· Dizziness. °· Involuntary eye movement (nystagmus). °Symptoms can be mild and cause only slight annoyance, or they can be severe and interfere with daily life. Episodes of benign positional vertigo may return (recur) over time, and they may be triggered by certain movements. Symptoms may improve over time. °DIAGNOSIS °This condition is usually diagnosed by medical history and a physical exam of the head, neck, and ears. You may be referred to a health care provider who specializes in ear, nose, and throat (ENT) problems (otolaryngologist) or a provider who specializes in disorders of the nervous system (neurologist). You may have additional testing, including: °· MRI. °· A CT scan. °· Eye movement tests. Your  health care provider may ask you to change positions quickly while he or she watches you for symptoms of benign positional vertigo, such as nystagmus. Eye movement may be tested with an electronystagmogram (ENG), caloric stimulation, the Dix-Hallpike test, or the roll test. °· An electroencephalogram (EEG). This records electrical activity in your brain. °· Hearing tests. °TREATMENT °Usually, your health care provider will treat this by moving your head in specific positions to adjust your inner ear back to normal. Surgery may be needed in severe cases, but this is rare. In some cases, benign positional vertigo may resolve on its own in 2-4 weeks. °HOME CARE INSTRUCTIONS °Safety °· Move slowly. Avoid sudden body or head movements. °· Avoid driving. °· Avoid operating heavy machinery. °· Avoid doing any tasks that would be dangerous to you or others if a vertigo episode would occur. °· If you have trouble walking or keeping your balance, try using a cane for stability. If you feel dizzy or unstable, sit down right away. °· Return to your normal activities as told by your health care provider. Ask your health care provider what activities are safe for you. °General Instructions °· Take over-the-counter and prescription medicines only as told by your health care provider. °· Avoid certain positions or movements as told by your health care provider. °· Drink enough fluid to keep your urine clear or pale yellow. °· Keep all follow-up visits as told by your health care provider. This is important. °SEEK MEDICAL CARE IF: °· You have a fever. °· Your condition gets worse or you develop new symptoms. °· Your family or friends   notice any behavioral changes.  Your nausea or vomiting gets worse.  You have numbness or a "pins and needles" sensation. SEEK IMMEDIATE MEDICAL CARE IF:  You have difficulty speaking or moving.  You are always dizzy.  You faint.  You develop severe headaches.  You have weakness in your  legs or arms.  You have changes in your hearing or vision.  You develop a stiff neck.  You develop sensitivity to light.   This information is not intended to replace advice given to you by your health care provider. Make sure you discuss any questions you have with your health care provider.   Follow up with your primary care provider as soon as possible for re-evaluation. Take home meclizine as needed for vertigo. Please return to the ED if you experience difficulty breathing, chest pain, weakness, slurred speech, facial drooping, loss of consciousness.

## 2016-05-10 LAB — HM MAMMOGRAPHY

## 2016-05-17 ENCOUNTER — Other Ambulatory Visit: Payer: Self-pay | Admitting: Family Medicine

## 2016-05-25 ENCOUNTER — Encounter: Payer: Self-pay | Admitting: Family Medicine

## 2016-05-31 ENCOUNTER — Encounter: Payer: Self-pay | Admitting: Family Medicine

## 2016-05-31 ENCOUNTER — Ambulatory Visit (INDEPENDENT_AMBULATORY_CARE_PROVIDER_SITE_OTHER): Payer: BC Managed Care – PPO | Admitting: Family Medicine

## 2016-05-31 VITALS — BP 122/90 | HR 77 | Temp 98.3°F | Ht 66.0 in | Wt 210.2 lb

## 2016-05-31 DIAGNOSIS — H8110 Benign paroxysmal vertigo, unspecified ear: Secondary | ICD-10-CM | POA: Diagnosis not present

## 2016-05-31 DIAGNOSIS — Z0000 Encounter for general adult medical examination without abnormal findings: Secondary | ICD-10-CM

## 2016-05-31 DIAGNOSIS — E785 Hyperlipidemia, unspecified: Secondary | ICD-10-CM

## 2016-05-31 DIAGNOSIS — I10 Essential (primary) hypertension: Secondary | ICD-10-CM | POA: Diagnosis not present

## 2016-05-31 DIAGNOSIS — R0600 Dyspnea, unspecified: Secondary | ICD-10-CM | POA: Diagnosis not present

## 2016-05-31 LAB — COMPREHENSIVE METABOLIC PANEL
ALK PHOS: 59 U/L (ref 39–117)
ALT: 21 U/L (ref 0–35)
AST: 22 U/L (ref 0–37)
Albumin: 4.1 g/dL (ref 3.5–5.2)
BILIRUBIN TOTAL: 0.4 mg/dL (ref 0.2–1.2)
BUN: 13 mg/dL (ref 6–23)
CALCIUM: 9.4 mg/dL (ref 8.4–10.5)
CHLORIDE: 101 meq/L (ref 96–112)
CO2: 31 meq/L (ref 19–32)
Creatinine, Ser: 0.84 mg/dL (ref 0.40–1.20)
GFR: 72.61 mL/min (ref >60.00)
Glucose, Bld: 104 mg/dL — ABNORMAL HIGH (ref 70–99)
Potassium: 3.4 mEq/L — ABNORMAL LOW (ref 3.5–5.1)
Sodium: 141 mEq/L (ref 135–145)
Total Protein: 7.3 g/dL (ref 6.0–8.3)

## 2016-05-31 LAB — POCT URINALYSIS DIPSTICK
BILIRUBIN UA: NEGATIVE
Blood, UA: NEGATIVE
GLUCOSE UA: NEGATIVE
Ketones, UA: NEGATIVE
LEUKOCYTES UA: NEGATIVE
NITRITE UA: NEGATIVE
Protein, UA: NEGATIVE
Spec Grav, UA: 1.025
Urobilinogen, UA: 0.2
pH, UA: 6

## 2016-05-31 LAB — MICROALBUMIN / CREATININE URINE RATIO
CREATININE, U: 177.5 mg/dL
MICROALB/CREAT RATIO: 0.4 mg/g (ref 0.0–30.0)
Microalb, Ur: <0.7 mg/dL (ref 0.0–1.9)

## 2016-05-31 LAB — LIPID PANEL
Cholesterol: 128 mg/dL (ref 0–200)
HDL: 47.4 mg/dL (ref >39.00)
LDL Cholesterol: 64 mg/dL (ref 0–99)
NonHDL: 80.79
TRIGLYCERIDES: 82 mg/dL (ref 0.0–149.0)
Total CHOL/HDL Ratio: 3
VLDL: 16.4 mg/dL (ref 0.0–40.0)

## 2016-05-31 LAB — TSH: TSH: 4.22 u[IU]/mL (ref 0.35–4.50)

## 2016-05-31 MED ORDER — MECLIZINE HCL 12.5 MG PO TABS: 12.5000 mg | ORAL_TABLET | Freq: Three times a day (TID) | ORAL | Status: DC | PRN

## 2016-05-31 MED ORDER — AMLODIPINE BESYLATE 2.5 MG PO TABS: 2.5000 mg | ORAL_TABLET | Freq: Every day | ORAL | Status: DC

## 2016-05-31 NOTE — Patient Instructions (Addendum)
Preventive Care for Adults, Female A healthy lifestyle and preventive care can promote health and wellness. Preventive health guidelines for women include the following key practices.  A routine yearly physical is a good way to check with your health care provider about your health and preventive screening. It is a chance to share any concerns and updates on your health and to receive a thorough exam.  Visit your dentist for a routine exam and preventive care every 6 months. Brush your teeth twice a day and floss once a day. Good oral hygiene prevents tooth decay and gum disease.  The frequency of eye exams is based on your age, health, family medical history, use of contact lenses, and other factors. Follow your health care provider's recommendations for frequency of eye exams.  Eat a healthy diet. Foods like vegetables, fruits, whole grains, low-fat dairy products, and lean protein foods contain the nutrients you need without too many calories. Decrease your intake of foods high in solid fats, added sugars, and salt. Eat the right amount of calories for you.Get information about a proper diet from your health care provider, if necessary.  Regular physical exercise is one of the most important things you can do for your health. Most adults should get at least 150 minutes of moderate-intensity exercise (any activity that increases your heart rate and causes you to sweat) each week. In addition, most adults need muscle-strengthening exercises on 2 or more days a week.  Maintain a healthy weight. The body mass index (BMI) is a screening tool to identify possible weight problems. It provides an estimate of body fat based on height and weight. Your health care provider can find your BMI and can help you achieve or maintain a healthy weight.For adults 20 years and older:  A BMI below 18.5 is considered underweight.  A BMI of 18.5 to 24.9 is normal.  A BMI of 25 to 29.9 is considered overweight.  A  BMI of 30 and above is considered obese.  Maintain normal blood lipids and cholesterol levels by exercising and minimizing your intake of saturated fat. Eat a balanced diet with plenty of fruit and vegetables. Blood tests for lipids and cholesterol should begin at age 45 and be repeated every 5 years. If your lipid or cholesterol levels are high, you are over 50, or you are at high risk for heart disease, you may need your cholesterol levels checked more frequently.Ongoing high lipid and cholesterol levels should be treated with medicines if diet and exercise are not working.  If you smoke, find out from your health care provider how to quit. If you do not use tobacco, do not start.  Lung cancer screening is recommended for adults aged 45-80 years who are at high risk for developing lung cancer because of a history of smoking. A yearly low-dose CT scan of the lungs is recommended for people who have at least a 30-pack-year history of smoking and are a current smoker or have quit within the past 15 years. A pack year of smoking is smoking an average of 1 pack of cigarettes a day for 1 year (for example: 1 pack a day for 30 years or 2 packs a day for 15 years). Yearly screening should continue until the smoker has stopped smoking for at least 15 years. Yearly screening should be stopped for people who develop a health problem that would prevent them from having lung cancer treatment.  If you are pregnant, do not drink alcohol. If you are  breastfeeding, be very cautious about drinking alcohol. If you are not pregnant and choose to drink alcohol, do not have more than 1 drink per day. One drink is considered to be 12 ounces (355 mL) of beer, 5 ounces (148 mL) of wine, or 1.5 ounces (44 mL) of liquor.  Avoid use of street drugs. Do not share needles with anyone. Ask for help if you need support or instructions about stopping the use of drugs.  High blood pressure causes heart disease and increases the risk  of stroke. Your blood pressure should be checked at least every 1 to 2 years. Ongoing high blood pressure should be treated with medicines if weight loss and exercise do not work.  If you are 55-79 years old, ask your health care provider if you should take aspirin to prevent strokes.  Diabetes screening is done by taking a blood sample to check your blood glucose level after you have not eaten for a certain period of time (fasting). If you are not overweight and you do not have risk factors for diabetes, you should be screened once every 3 years starting at age 45. If you are overweight or obese and you are 40-70 years of age, you should be screened for diabetes every year as part of your cardiovascular risk assessment.  Breast cancer screening is essential preventive care for women. You should practice "breast self-awareness." This means understanding the normal appearance and feel of your breasts and may include breast self-examination. Any changes detected, no matter how small, should be reported to a health care provider. Women in their 20s and 30s should have a clinical breast exam (CBE) by a health care provider as part of a regular health exam every 1 to 3 years. After age 40, women should have a CBE every year. Starting at age 40, women should consider having a mammogram (breast X-ray test) every year. Women who have a family history of breast cancer should talk to their health care provider about genetic screening. Women at a high risk of breast cancer should talk to their health care providers about having an MRI and a mammogram every year.  Breast cancer gene (BRCA)-related cancer risk assessment is recommended for women who have family members with BRCA-related cancers. BRCA-related cancers include breast, ovarian, tubal, and peritoneal cancers. Having family members with these cancers may be associated with an increased risk for harmful changes (mutations) in the breast cancer genes BRCA1 and  BRCA2. Results of the assessment will determine the need for genetic counseling and BRCA1 and BRCA2 testing.  Your health care provider may recommend that you be screened regularly for cancer of the pelvic organs (ovaries, uterus, and vagina). This screening involves a pelvic examination, including checking for microscopic changes to the surface of your cervix (Pap test). You may be encouraged to have this screening done every 3 years, beginning at age 21.  For women ages 30-65, health care providers may recommend pelvic exams and Pap testing every 3 years, or they may recommend the Pap and pelvic exam, combined with testing for human papilloma virus (HPV), every 5 years. Some types of HPV increase your risk of cervical cancer. Testing for HPV may also be done on women of any age with unclear Pap test results.  Other health care providers may not recommend any screening for nonpregnant women who are considered low risk for pelvic cancer and who do not have symptoms. Ask your health care provider if a screening pelvic exam is right for   you.  If you have had past treatment for cervical cancer or a condition that could lead to cancer, you need Pap tests and screening for cancer for at least 20 years after your treatment. If Pap tests have been discontinued, your risk factors (such as having a new sexual partner) need to be reassessed to determine if screening should resume. Some women have medical problems that increase the chance of getting cervical cancer. In these cases, your health care provider may recommend more frequent screening and Pap tests.  Colorectal cancer can be detected and often prevented. Most routine colorectal cancer screening begins at the age of 50 years and continues through age 75 years. However, your health care provider may recommend screening at an earlier age if you have risk factors for colon cancer. On a yearly basis, your health care provider may provide home test kits to check  for hidden blood in the stool. Use of a small camera at the end of a tube, to directly examine the colon (sigmoidoscopy or colonoscopy), can detect the earliest forms of colorectal cancer. Talk to your health care provider about this at age 50, when routine screening begins. Direct exam of the colon should be repeated every 5-10 years through age 75 years, unless early forms of precancerous polyps or small growths are found.  People who are at an increased risk for hepatitis B should be screened for this virus. You are considered at high risk for hepatitis B if:  You were born in a country where hepatitis B occurs often. Talk with your health care provider about which countries are considered high risk.  Your parents were born in a high-risk country and you have not received a shot to protect against hepatitis B (hepatitis B vaccine).  You have HIV or AIDS.  You use needles to inject street drugs.  You live with, or have sex with, someone who has hepatitis B.  You get hemodialysis treatment.  You take certain medicines for conditions like cancer, organ transplantation, and autoimmune conditions.  Hepatitis C blood testing is recommended for all people born from 1945 through 1965 and any individual with known risks for hepatitis C.  Practice safe sex. Use condoms and avoid high-risk sexual practices to reduce the spread of sexually transmitted infections (STIs). STIs include gonorrhea, chlamydia, syphilis, trichomonas, herpes, HPV, and human immunodeficiency virus (HIV). Herpes, HIV, and HPV are viral illnesses that have no cure. They can result in disability, cancer, and death.  You should be screened for sexually transmitted illnesses (STIs) including gonorrhea and chlamydia if:  You are sexually active and are younger than 24 years.  You are older than 24 years and your health care provider tells you that you are at risk for this type of infection.  Your sexual activity has changed  since you were last screened and you are at an increased risk for chlamydia or gonorrhea. Ask your health care provider if you are at risk.  If you are at risk of being infected with HIV, it is recommended that you take a prescription medicine daily to prevent HIV infection. This is called preexposure prophylaxis (PrEP). You are considered at risk if:  You are sexually active and do not regularly use condoms or know the HIV status of your partner(s).  You take drugs by injection.  You are sexually active with a partner who has HIV.  Talk with your health care provider about whether you are at high risk of being infected with HIV. If   you choose to begin PrEP, you should first be tested for HIV. You should then be tested every 3 months for as long as you are taking PrEP.  Osteoporosis is a disease in which the bones lose minerals and strength with aging. This can result in serious bone fractures or breaks. The risk of osteoporosis can be identified using a bone density scan. Women ages 67 years and over and women at risk for fractures or osteoporosis should discuss screening with their health care providers. Ask your health care provider whether you should take a calcium supplement or vitamin D to reduce the rate of osteoporosis.  Menopause can be associated with physical symptoms and risks. Hormone replacement therapy is available to decrease symptoms and risks. You should talk to your health care provider about whether hormone replacement therapy is right for you.  Use sunscreen. Apply sunscreen liberally and repeatedly throughout the day. You should seek shade when your shadow is shorter than you. Protect yourself by wearing long sleeves, pants, a wide-brimmed hat, and sunglasses year round, whenever you are outdoors.  Once a month, do a whole body skin exam, using a mirror to look at the skin on your back. Tell your health care provider of new moles, moles that have irregular borders, moles that  are larger than a pencil eraser, or moles that have changed in shape or color.  Stay current with required vaccines (immunizations).  Influenza vaccine. All adults should be immunized every year.  Tetanus, diphtheria, and acellular pertussis (Td, Tdap) vaccine. Pregnant women should receive 1 dose of Tdap vaccine during each pregnancy. The dose should be obtained regardless of the length of time since the last dose. Immunization is preferred during the 27th-36th week of gestation. An adult who has not previously received Tdap or who does not know her vaccine status should receive 1 dose of Tdap. This initial dose should be followed by tetanus and diphtheria toxoids (Td) booster doses every 10 years. Adults with an unknown or incomplete history of completing a 3-dose immunization series with Td-containing vaccines should begin or complete a primary immunization series including a Tdap dose. Adults should receive a Td booster every 10 years.  Varicella vaccine. An adult without evidence of immunity to varicella should receive 2 doses or a second dose if she has previously received 1 dose. Pregnant females who do not have evidence of immunity should receive the first dose after pregnancy. This first dose should be obtained before leaving the health care facility. The second dose should be obtained 4-8 weeks after the first dose.  Human papillomavirus (HPV) vaccine. Females aged 13-26 years who have not received the vaccine previously should obtain the 3-dose series. The vaccine is not recommended for use in pregnant females. However, pregnancy testing is not needed before receiving a dose. If a female is found to be pregnant after receiving a dose, no treatment is needed. In that case, the remaining doses should be delayed until after the pregnancy. Immunization is recommended for any person with an immunocompromised condition through the age of 61 years if she did not get any or all doses earlier. During the  3-dose series, the second dose should be obtained 4-8 weeks after the first dose. The third dose should be obtained 24 weeks after the first dose and 16 weeks after the second dose.  Zoster vaccine. One dose is recommended for adults aged 30 years or older unless certain conditions are present.  Measles, mumps, and rubella (MMR) vaccine. Adults born  before 1957 generally are considered immune to measles and mumps. Adults born in 1957 or later should have 1 or more doses of MMR vaccine unless there is a contraindication to the vaccine or there is laboratory evidence of immunity to each of the three diseases. A routine second dose of MMR vaccine should be obtained at least 28 days after the first dose for students attending postsecondary schools, health care workers, or international travelers. People who received inactivated measles vaccine or an unknown type of measles vaccine during 1963-1967 should receive 2 doses of MMR vaccine. People who received inactivated mumps vaccine or an unknown type of mumps vaccine before 1979 and are at high risk for mumps infection should consider immunization with 2 doses of MMR vaccine. For females of childbearing age, rubella immunity should be determined. If there is no evidence of immunity, females who are not pregnant should be vaccinated. If there is no evidence of immunity, females who are pregnant should delay immunization until after pregnancy. Unvaccinated health care workers born before 1957 who lack laboratory evidence of measles, mumps, or rubella immunity or laboratory confirmation of disease should consider measles and mumps immunization with 2 doses of MMR vaccine or rubella immunization with 1 dose of MMR vaccine.  Pneumococcal 13-valent conjugate (PCV13) vaccine. When indicated, a person who is uncertain of his immunization history and has no record of immunization should receive the PCV13 vaccine. All adults 65 years of age and older should receive this  vaccine. An adult aged 19 years or older who has certain medical conditions and has not been previously immunized should receive 1 dose of PCV13 vaccine. This PCV13 should be followed with a dose of pneumococcal polysaccharide (PPSV23) vaccine. Adults who are at high risk for pneumococcal disease should obtain the PPSV23 vaccine at least 8 weeks after the dose of PCV13 vaccine. Adults older than 65 years of age who have normal immune system function should obtain the PPSV23 vaccine dose at least 1 year after the dose of PCV13 vaccine.  Pneumococcal polysaccharide (PPSV23) vaccine. When PCV13 is also indicated, PCV13 should be obtained first. All adults aged 65 years and older should be immunized. An adult younger than age 65 years who has certain medical conditions should be immunized. Any person who resides in a nursing home or long-term care facility should be immunized. An adult smoker should be immunized. People with an immunocompromised condition and certain other conditions should receive both PCV13 and PPSV23 vaccines. People with human immunodeficiency virus (HIV) infection should be immunized as soon as possible after diagnosis. Immunization during chemotherapy or radiation therapy should be avoided. Routine use of PPSV23 vaccine is not recommended for American Indians, [place] Natives, or people younger than 65 years unless there are medical conditions that require PPSV23 vaccine. When indicated, people who have unknown immunization and have no record of immunization should receive PPSV23 vaccine. One-time revaccination 5 years after the first dose of PPSV23 is recommended for people aged 19-64 years who have chronic kidney failure, nephrotic syndrome, asplenia, or immunocompromised conditions. People who received 1-2 doses of PPSV23 before age 65 years should receive another dose of PPSV23 vaccine at age 65 years or later if at least 5 years have passed since the previous dose. Doses of PPSV23 are not  needed for people immunized with PPSV23 at or after age 65 years.  Meningococcal vaccine. Adults with asplenia or persistent complement component deficiencies should receive 2 doses of quadrivalent meningococcal conjugate (MenACWY-D) vaccine. The doses should be obtained   at least 2 months apart. Microbiologists working with certain meningococcal bacteria, [place] recruits, people at risk during an outbreak, and people who travel to or live in countries with a high rate of meningitis should be immunized. A first-year college student up through age 34 years who is living in a residence hall should receive a dose if she did not receive a dose on or after her 16th birthday. Adults who have certain high-risk conditions should receive one or more doses of vaccine.  Hepatitis A vaccine. Adults who wish to be protected from this disease, have certain high-risk conditions, work with hepatitis A-infected animals, work in hepatitis A research labs, or travel to or work in countries with a high rate of hepatitis A should be immunized. Adults who were previously unvaccinated and who anticipate close contact with an international adoptee during the first 60 days after arrival in the [place] from a country with a high rate of hepatitis A should be immunized.  Hepatitis B vaccine. Adults who wish to be protected from this disease, have certain high-risk conditions, may be exposed to blood or other infectious body fluids, are household contacts or sex partners of hepatitis B positive people, are clients or workers in certain care facilities, or travel to or work in countries with a high rate of hepatitis B should be immunized.  Haemophilus influenzae type b (Hib) vaccine. A previously unvaccinated person with asplenia or sickle cell disease or having a scheduled splenectomy should receive 1 dose of Hib vaccine. Regardless of previous immunization, a recipient of a hematopoietic stem cell transplant should receive a  3-dose series 6-12 months after her successful transplant. Hib vaccine is not recommended for adults with HIV infection. Preventive Services / Frequency Ages 35 to 4 years  Blood pressure check.** / Every 3-5 years.  Lipid and cholesterol check.** / Every 5 years beginning at age 60.  Clinical breast exam.** / Every 3 years for women in their 71s and 10s.  BRCA-related cancer risk assessment.** / For women who have family members with a BRCA-related cancer (breast, ovarian, tubal, or peritoneal cancers).  Pap test.** / Every 2 years from ages 76 through 26. Every 3 years starting at age 61 through age 76 or 93 with a history of 3 consecutive normal Pap tests.  HPV screening.** / Every 3 years from ages 37 through ages 60 to 51 with a history of 3 consecutive normal Pap tests.  Hepatitis C blood test.** / For any individual with known risks for hepatitis C.  Skin self-exam. / Monthly.  Influenza vaccine. / Every year.  Tetanus, diphtheria, and acellular pertussis (Tdap, Td) vaccine.** / Consult your health care provider. Pregnant women should receive 1 dose of Tdap vaccine during each pregnancy. 1 dose of Td every 10 years.  Varicella vaccine.** / Consult your health care provider. Pregnant females who do not have evidence of immunity should receive the first dose after pregnancy.  HPV vaccine. / 3 doses over 6 months, if 93 and younger. The vaccine is not recommended for use in pregnant females. However, pregnancy testing is not needed before receiving a dose.  Measles, mumps, rubella (MMR) vaccine.** / You need at least 1 dose of MMR if you were born in 1957 or later. You may also need a 2nd dose. For females of childbearing age, rubella immunity should be determined. If there is no evidence of immunity, females who are not pregnant should be vaccinated. If there is no evidence of immunity, females who are  pregnant should delay immunization until after pregnancy.  Pneumococcal  13-valent conjugate (PCV13) vaccine.** / Consult your health care provider.  Pneumococcal polysaccharide (PPSV23) vaccine.** / 1 to 2 doses if you smoke cigarettes or if you have certain conditions.  Meningococcal vaccine.** / 1 dose if you are age 68 to 8 years and a Market researcher living in a residence hall, or have one of several medical conditions, you need to get vaccinated against meningococcal disease. You may also need additional booster doses.  Hepatitis A vaccine.** / Consult your health care provider.  Hepatitis B vaccine.** / Consult your health care provider.  Haemophilus influenzae type b (Hib) vaccine.** / Consult your health care provider. Ages 7 to 53 years  Blood pressure check.** / Every year.  Lipid and cholesterol check.** / Every 5 years beginning at age 25 years.  Lung cancer screening. / Every year if you are aged 11-80 years and have a 30-pack-year history of smoking and currently smoke or have quit within the past 15 years. Yearly screening is stopped once you have quit smoking for at least 15 years or develop a health problem that would prevent you from having lung cancer treatment.  Clinical breast exam.** / Every year after age 48 years.  BRCA-related cancer risk assessment.** / For women who have family members with a BRCA-related cancer (breast, ovarian, tubal, or peritoneal cancers).  Mammogram.** / Every year beginning at age 41 years and continuing for as long as you are in good health. Consult with your health care provider.  Pap test.** / Every 3 years starting at age 65 years through age 37 or 70 years with a history of 3 consecutive normal Pap tests.  HPV screening.** / Every 3 years from ages 72 years through ages 60 to 40 years with a history of 3 consecutive normal Pap tests.  Fecal occult blood test (FOBT) of stool. / Every year beginning at age 21 years and continuing until age 5 years. You may not need to do this test if you get  a colonoscopy every 10 years.  Flexible sigmoidoscopy or colonoscopy.** / Every 5 years for a flexible sigmoidoscopy or every 10 years for a colonoscopy beginning at age 35 years and continuing until age 48 years.  Hepatitis C blood test.** / For all people born from [birth year] through 1965 and any individual with known risks for hepatitis C.  Skin self-exam. / Monthly.  Influenza vaccine. / Every year.  Tetanus, diphtheria, and acellular pertussis (Tdap/Td) vaccine.** / Consult your health care provider. Pregnant women should receive 1 dose of Tdap vaccine during each pregnancy. 1 dose of Td every 10 years.  Varicella vaccine.** / Consult your health care provider. Pregnant females who do not have evidence of immunity should receive the first dose after pregnancy.  Zoster vaccine.** / 1 dose for adults aged 30 years or older.  Measles, mumps, rubella (MMR) vaccine.** / You need at least 1 dose of MMR if you were born in 1957 or later. You may also need a second dose. For females of childbearing age, rubella immunity should be determined. If there is no evidence of immunity, females who are not pregnant should be vaccinated. If there is no evidence of immunity, females who are pregnant should delay immunization until after pregnancy.  Pneumococcal 13-valent conjugate (PCV13) vaccine.** / Consult your health care provider.  Pneumococcal polysaccharide (PPSV23) vaccine.** / 1 to 2 doses if you smoke cigarettes or if you have certain conditions.  Meningococcal vaccine.** /  Consult your health care provider.  Hepatitis A vaccine.** / Consult your health care provider.  Hepatitis B vaccine.** / Consult your health care provider.  Haemophilus influenzae type b (Hib) vaccine.** / Consult your health care provider. Ages 65 years and over  Blood pressure check.** / Every year.  Lipid and cholesterol check.** / Every 5 years beginning at age 20 years.  Lung cancer screening. / Every year if you  are aged 55-80 years and have a 30-pack-year history of smoking and currently smoke or have quit within the past 15 years. Yearly screening is stopped once you have quit smoking for at least 15 years or develop a health problem that would prevent you from having lung cancer treatment.  Clinical breast exam.** / Every year after age 40 years.  BRCA-related cancer risk assessment.** / For women who have family members with a BRCA-related cancer (breast, ovarian, tubal, or peritoneal cancers).  Mammogram.** / Every year beginning at age 40 years and continuing for as long as you are in good health. Consult with your health care provider.  Pap test.** / Every 3 years starting at age 30 years through age 65 or 70 years with 3 consecutive normal Pap tests. Testing can be stopped between 65 and 70 years with 3 consecutive normal Pap tests and no abnormal Pap or HPV tests in the past 10 years.  HPV screening.** / Every 3 years from ages 30 years through ages 65 or 70 years with a history of 3 consecutive normal Pap tests. Testing can be stopped between 65 and 70 years with 3 consecutive normal Pap tests and no abnormal Pap or HPV tests in the past 10 years.  Fecal occult blood test (FOBT) of stool. / Every year beginning at age 50 years and continuing until age 75 years. You may not need to do this test if you get a colonoscopy every 10 years.  Flexible sigmoidoscopy or colonoscopy.** / Every 5 years for a flexible sigmoidoscopy or every 10 years for a colonoscopy beginning at age 50 years and continuing until age 75 years.  Hepatitis C blood test.** / For all people born from 1945 through 1965 and any individual with known risks for hepatitis C.  Osteoporosis screening.** / A one-time screening for women ages 65 years and over and women at risk for fractures or osteoporosis.  Skin self-exam. / Monthly.  Influenza vaccine. / Every year.  Tetanus, diphtheria, and acellular pertussis (Tdap/Td)  vaccine.** / 1 dose of Td every 10 years.  Varicella vaccine.** / Consult your health care provider.  Zoster vaccine.** / 1 dose for adults aged 60 years or older.  Pneumococcal 13-valent conjugate (PCV13) vaccine.** / Consult your health care provider.  Pneumococcal polysaccharide (PPSV23) vaccine.** / 1 dose for all adults aged 65 years and older.  Meningococcal vaccine.** / Consult your health care provider.  Hepatitis A vaccine.** / Consult your health care provider.  Hepatitis B vaccine.** / Consult your health care provider.  Haemophilus influenzae type b (Hib) vaccine.** / Consult your health care provider. ** Family history and personal history of risk and conditions may change your health care provider's recommendations.   This information is not intended to replace advice given to you by your health care provider. Make sure you discuss any questions you have with your health care provider.   Document Released: 02/08/2002 Document Revised: 01/03/2015 Document Reviewed: 05/10/2011 Elsevier Interactive Patient Education 2016 Elsevier Inc.  

## 2016-05-31 NOTE — Progress Notes (Signed)
Pre visit review using our clinic review tool, if applicable. No additional management support is needed unless otherwise documented below in the visit note. 

## 2016-05-31 NOTE — Progress Notes (Signed)
Subjective:     Tracy Thompson is a 65 y.o. female and is here for a comprehensive physical exam. The patient reports problems - was seen in er on 5/ 10 for vertigo and sob-- cxr was abnormal.  Social History   Social History  . Marital Status: Married    Spouse Name: N/A  . Number of Children: N/A  . Years of Education: N/A   Occupational History  . retired Pharmacist, hospital    Social History Main Topics  . Smoking status: Never Smoker   . Smokeless tobacco: Never Used  . Alcohol Use: No  . Drug Use: No  . Sexual Activity:    Partners: Male   Other Topics Concern  . Not on file   Social History Narrative   Exercise--- walking , weights   Health Maintenance  Topic Date Due  . HIV Screening  09/09/1967  . INFLUENZA VACCINE  07/27/2016  . PAP SMEAR  04/21/2018  . MAMMOGRAM  05/10/2018  . COLONOSCOPY  07/25/2022  . TETANUS/TDAP  04/17/2023  . ZOSTAVAX  Completed  . Hepatitis C Screening  Completed    The following portions of the patient's history were reviewed and updated as appropriate:  She  has a past medical history of Breast cancer ([place]) (1994); Menopause; Hypertension; and Factor 5 Leiden mutation, heterozygous ([place]). She  does not have any pertinent problems on file. She  has past surgical history that includes Breast lumpectomy (Right, 1994); varicose veins (Bilateral, 2013); and Cholecystectomy (06/20/2002). Her family history includes COPD in her brother and mother; Cancer in her father and mother; Colon cancer in her mother; Deep vein thrombosis in her father; Dementia in her mother; Diabetes in her father; Hepatitis in her mother; Hepatitis C in her brother; Hyperlipidemia in her mother; Hypertension in her father and mother; Mental illness in her brother; Prostate cancer in her father; Pulmonary embolism in her father; Schizophrenia in her brother; Stroke in her father and mother. She  reports that she has never smoked. She has never used smokeless tobacco. She reports  that she does not drink alcohol or use illicit drugs. She has a current medication list which includes the following prescription(s): aspirin, atorvastatin, bisoprolol-hydrochlorothiazide, cholecalciferol, coenzyme q10, hydrochlorothiazide, meclizine, and raloxifene. Current Outpatient Prescriptions on File Prior to Visit  Medication Sig Dispense Refill  . aspirin 81 MG tablet Take 81 mg by mouth daily.      Marland Kitchen atorvastatin (LIPITOR) 10 MG tablet take 1 tablet by mouth once daily 30 tablet 5  . bisoprolol-hydrochlorothiazide (ZIAC) 10-6.25 MG tablet take 1 tablet by mouth once daily 90 tablet 1  . cholecalciferol (VITAMIN D) 1000 units tablet Take 2,000 Units by mouth daily.    . Coenzyme Q10 200 MG capsule Take 200 mg by mouth daily.    . hydrochlorothiazide (HYDRODIURIL) 25 MG tablet take 1 tablet by mouth once daily 90 tablet 1  . raloxifene (EVISTA) 60 MG tablet take 1 tablet by mouth once daily 90 tablet 1   No current facility-administered medications on file prior to visit.   She has No Known Allergies..  Review of Systems Review of Systems  Constitutional: Negative for activity change, appetite change and fatigue.  HENT: Negative for hearing loss, congestion, tinnitus and ear discharge.  dentist q28mEyes: Negative for visual disturbance (see optho q1y -- vision corrected to 20/20 with glasses).  Respiratory: Negative for cough, chest tightness   + sob.   Cardiovascular: Negative for chest pain, palpitations and leg swelling.  Gastrointestinal: Negative for abdominal pain, diarrhea, constipation and abdominal distention.  Genitourinary: Negative for urgency, frequency, decreased urine volume and difficulty urinating.  Musculoskeletal: Negative for back pain, arthralgias and gait problem.  Skin: Negative for color change, pallor and rash.  Neurological: positive for dizziness  Hematological: Negative for adenopathy. Does not bruise/bleed easily.  Psychiatric/Behavioral: Negative for  suicidal ideas, confusion, sleep disturbance, self-injury, dysphoric mood, decreased concentration and agitation.       Objective:    BP 122/90 mmHg  Pulse 77  Temp(Src) 98.3 F (36.8 C) (Oral)  Ht '5\' 6"'$  (1.676 m)  Wt 210 lb 3.2 oz (95.346 kg)  BMI 33.94 kg/m2  SpO2 98% General appearance: alert, cooperative, appears stated age and no distress Head: Normocephalic, without obvious abnormality, atraumatic Eyes: negative findings: lids and lashes normal and pupils equal, round, reactive to light and accomodation Ears: normal TM's and external ear canals both ears Nose: Nares normal. Septum midline. Mucosa normal. No drainage or sinus tenderness. Throat: lips, mucosa, and tongue normal; teeth and gums normal Neck: no adenopathy, supple, symmetrical, trachea midline and thyroid not enlarged, symmetric, no tenderness/mass/nodules Back: + scoliosis Lungs: clear to auscultation bilaterally Breasts: normal appearance, no masses or tenderness Heart: S1, S2 normal Abdomen: soft, non-tender; bowel sounds normal; no masses,  no organomegaly Pelvic: deferred Extremities: extremities normal, atraumatic, no cyanosis or edema Pulses: 2+ and symmetric Skin: Skin color, texture, turgor normal. No rashes or lesions Lymph nodes: Cervical, supraclavicular, and axillary nodes normal. Neurologic: Alert and oriented X 3, normal strength and tone. Normal symmetric reflexes. Normal coordination and gait     Assessment:    Healthy female exam.      Plan:     ghm utd Check labs See After Visit Summary for Counseling Recommendations   1. Dyspnea With abn cxr - ECHOCARDIOGRAM COMPLETE; Future + scoliosis and hx radiation ( breast cancer)  2. Benign paroxysmal positional vertigo, unspecified laterality  - meclizine (ANTIVERT) 12.5 MG tablet; Take 1 tablet (12.5 mg total) by mouth 3 (three) times daily as needed for dizziness.  Dispense: 30 tablet; Refill: 0  3. Essential hypertension Still  elevated-- home bp reviewed con't ziac Add amlodipine con't to check at home and send readings in 2 weeks or sooner if there is a problem - amLODipine (NORVASC) 2.5 MG tablet; Take 1 tablet (2.5 mg total) by mouth daily.  Dispense: 30 tablet; Refill: 2 - Comprehensive metabolic panel - Lipid panel - POCT urinalysis dipstick - Microalbumin / creatinine urine ratio - TSH  4. Hyperlipidemia con't lipitor Check labs - Comprehensive metabolic panel - Lipid panel - POCT urinalysis dipstick - Microalbumin / creatinine urine ratio - TSH  5. Preventative health care See above - Comprehensive metabolic panel - Lipid panel - POCT urinalysis dipstick - Microalbumin / creatinine urine ratio - TSH

## 2016-06-09 ENCOUNTER — Ambulatory Visit (HOSPITAL_BASED_OUTPATIENT_CLINIC_OR_DEPARTMENT_OTHER)
Admission: RE | Admit: 2016-06-09 | Discharge: 2016-06-09 | Disposition: A | Discharge status: Home / Self Care | Payer: BC Managed Care – PPO | Source: Ambulatory Visit | Attending: Family Medicine | Admitting: Family Medicine

## 2016-06-09 DIAGNOSIS — I10 Essential (primary) hypertension: Secondary | ICD-10-CM | POA: Diagnosis not present

## 2016-06-09 DIAGNOSIS — I340 Nonrheumatic mitral (valve) insufficiency: Secondary | ICD-10-CM | POA: Insufficient documentation

## 2016-06-09 DIAGNOSIS — R0600 Dyspnea, unspecified: Secondary | ICD-10-CM | POA: Diagnosis present

## 2016-06-09 LAB — ECHOCARDIOGRAM COMPLETE
CHL CUP MV DEC (S): 183
EERAT: 12.12
EWDT: 183 ms
FS: 39 % (ref 28–44)
IV/PV OW: 1.02
LA diam end sys: 35 mm
LA diam index: 1.72 cm/m2
LA vol index: 19.1 mL/m2
LA vol: 39 mL
LASIZE: 35 mm
LAVOLA4C: 45.1 mL
LV TDI E'MEDIAL: 6.74
LV e' LATERAL: 7.72 cm/s
LVEEAVG: 12.12
LVEEMED: 12.12
LVOT area: 3.14 cm2
LVOT diameter: 20 mm
MV Peak grad: 4 mmHg
MV pk A vel: 86.3 m/s
MV pk E vel: 93.6 m/s
PW: 7.16 mm — AB (ref 0.6–1.1)
RV TAPSE: 25.6 mm
TDI e' lateral: 7.72

## 2016-06-09 NOTE — Progress Notes (Signed)
  Echocardiogram 2D Echocardiogram has been performed.  Tracy Thompson 06/09/2016, 9:53 AM

## 2016-06-10 ENCOUNTER — Telehealth: Payer: Self-pay | Admitting: Family Medicine

## 2016-06-10 DIAGNOSIS — I5189 Other ill-defined heart diseases: Secondary | ICD-10-CM

## 2016-06-10 NOTE — Telephone Encounter (Signed)
See echo result note 

## 2016-06-10 NOTE — Telephone Encounter (Signed)
Pt returned CMA's call for lab results.

## 2016-07-07 ENCOUNTER — Encounter: Payer: Self-pay | Admitting: Cardiology

## 2016-07-30 ENCOUNTER — Telehealth: Payer: Self-pay | Admitting: Family Medicine

## 2016-07-30 NOTE — Telephone Encounter (Signed)
Relation to QA:SUOR Call back number:[phone]  Reason for call:  Patient states she received a reminder thru mychart to have labs only. Please advise chart doesn't reflect orders.

## 2016-07-30 NOTE — Telephone Encounter (Signed)
Labs are not due until sept.     KP

## 2016-08-03 ENCOUNTER — Ambulatory Visit: Payer: BC Managed Care – PPO | Admitting: Cardiology

## 2016-08-04 NOTE — Telephone Encounter (Signed)
Patient scheduled for 09/02/16 labs only

## 2016-08-16 ENCOUNTER — Ambulatory Visit
Admission: RE | Admit: 2016-08-16 | Discharge: 2016-08-16 | Disposition: A | Discharge status: Home / Self Care | Payer: BC Managed Care – PPO | Source: Ambulatory Visit | Attending: Cardiology | Admitting: Cardiology

## 2016-08-16 ENCOUNTER — Ambulatory Visit (INDEPENDENT_AMBULATORY_CARE_PROVIDER_SITE_OTHER): Payer: BC Managed Care – PPO | Admitting: Cardiology

## 2016-08-16 ENCOUNTER — Encounter: Payer: Self-pay | Admitting: Cardiology

## 2016-08-16 VITALS — BP 116/68 | HR 84 | Ht 66.0 in | Wt 207.0 lb

## 2016-08-16 DIAGNOSIS — R0602 Shortness of breath: Secondary | ICD-10-CM | POA: Diagnosis not present

## 2016-08-16 DIAGNOSIS — J986 Disorders of diaphragm: Secondary | ICD-10-CM

## 2016-08-16 NOTE — Progress Notes (Signed)
Cardiology Office Note    Date:  08/16/2016   ID:  Tracy, Thompson 1951/12/08, MRN [id number]  PCP:  [name], DO  Cardiologist:   Candee Furbish, MD     History of Present Illness:  Tracy Thompson is a 65 y.o. female here for evaluation of grade 2 diastolic dysfunction seen on echocardiogram 06/09/16. Ejection fraction was normal. Left atrial size was actually normal. Wall thickness of left ventricle was also normal. When I personally reviewed the echocardiogram, although there were some changes in her diastolic contour parameters, I do not think that she had classic grade 2 diastolic dysfunction. See below for details. Her EF is normal. Heart size certainly was normal. No evidence of cardiomegaly as was suggested by her chest x-ray.  Had an episode thought it was vertigo. ER. CXR - heart was enlarged. The chest x-ray also demonstrated in May marked right hemidiaphragm elevation. This was a change since 2005 comparison x-ray. She had her lumpectomy and surgery and radiation in 1994. She has also noted some reduction in her breast size on the right over the past 2-3 years.  She has been experiencing some shortness of breath, not severe but worse sometimes when laying down. She is thought that this is secondary deconditioning at times.  Denies any chest pain, syncope, bleeding, orthopnea, weight loss.  Past Medical History:  Diagnosis Date  . Breast cancer ([place]) 1994   s/p chemo 8/94-1/95, radiation 8/94-11/94  . Factor 5 Leiden mutation, heterozygous [facility])    also has Factor 8 problems  . Hypertension   . Menopause    chemo induced    Past Surgical History:  Procedure Laterality Date  . BREAST LUMPECTOMY Right 1994   w/ radiation and chemotherapy following lumpectomy  . CHOLECYSTECTOMY  06/20/2002   Laparoscopic cholestectomy  . varicose veins Bilateral 2013   laser surgery   Dr. Jones Skene    Current Medications: Outpatient Medications Prior to Visit    Medication Sig Dispense Refill  . amLODipine (NORVASC) 2.5 MG tablet Take 1 tablet (2.5 mg total) by mouth daily. 30 tablet 2  . aspirin 81 MG tablet Take 81 mg by mouth daily.      Marland Kitchen atorvastatin (LIPITOR) 10 MG tablet take 1 tablet by mouth once daily 30 tablet 5  . bisoprolol-hydrochlorothiazide (ZIAC) 10-6.25 MG tablet take 1 tablet by mouth once daily 90 tablet 1  . cholecalciferol (VITAMIN D) 1000 units tablet Take 2,000 Units by mouth daily.    . Coenzyme Q10 200 MG capsule Take 200 mg by mouth daily.    . hydrochlorothiazide (HYDRODIURIL) 25 MG tablet take 1 tablet by mouth once daily 90 tablet 1  . meclizine (ANTIVERT) 12.5 MG tablet Take 1 tablet (12.5 mg total) by mouth 3 (three) times daily as needed for dizziness. 30 tablet 0  . raloxifene (EVISTA) 60 MG tablet take 1 tablet by mouth once daily 90 tablet 1   No facility-administered medications prior to visit.      Allergies:   Review of patient's allergies indicates no known allergies.   Social History   Social History  . Marital status: Married    Spouse name: N/A  . Number of children: N/A  . Years of education: N/A   Occupational History  . retired Pharmacist, hospital Retired   Social History Main Topics  . Smoking status: Never Smoker  . Smokeless tobacco: Never Used  . Alcohol use No  . Drug use: No  . Sexual  activity: Yes    Partners: Male   Other Topics Concern  . None   Social History Narrative   Exercise--- walking , weights     Family History:  The patient's family history includes COPD in her brother and mother; Cancer in her father and mother; Colon cancer in her mother; Deep vein thrombosis in her father; Dementia in her mother; Diabetes in her father; Hepatitis in her mother; Hepatitis C in her brother; Hyperlipidemia in her mother; Hypertension in her father and mother; Mental illness in her brother; Prostate cancer in her father; Pulmonary embolism in her father; Schizophrenia in her brother; Stroke in  her father and mother.   ROS:   Please see the history of present illness.    ROS All other systems reviewed and are negative.   PHYSICAL EXAM:   VS:  BP 116/68   Pulse 84   Ht '5\' 6"'$  (1.676 m)   Wt 207 lb (93.9 kg)   BMI 33.41 kg/m    GEN: Well nourished, well developed, in no acute distress  HEENT: normal  Neck: no JVD, carotid bruits, or masses Cardiac: RRR; no murmurs, rubs, or gallops,no edema  Respiratory:  clear to auscultation bilaterally, normal work of breathing GI: soft, nontender, nondistended, + BS MS: no deformity or atrophy  Skin: warm and dry, no rash, right arm edema Neuro:  Alert and Oriented x 3, Strength and sensation are intact Psych: euthymic mood, full affect  Wt Readings from Last 3 Encounters:  08/16/16 207 lb (93.9 kg)  05/31/16 210 lb 3.2 oz (95.3 kg)  01/27/16 208 lb 12.8 oz (94.7 kg)      Studies/Labs Reviewed:   EKG:  05/05/16-sinus rhythm with nonspecific T-wave changes.  Recent Labs: 05/05/2016: Hemoglobin 14.7; Platelets 290 05/31/2016: ALT 21; BUN 13; Creatinine, Ser 0.84; Potassium 3.4; Sodium 141; TSH 4.22   Lipid Panel    Component Value Date/Time   CHOL 128 05/31/2016 0913   TRIG 82.0 05/31/2016 0913   HDL 47.40 05/31/2016 0913   CHOLHDL 3 05/31/2016 0913   VLDL 16.4 05/31/2016 0913   LDLCALC 64 05/31/2016 0913   LDLDIRECT 135.7 02/11/2012 0938    Additional studies/ records that were reviewed today include:  ECHO: 06/09/16: - Left ventricle: The cavity size was normal. Wall thickness was   normal. Systolic function was normal. The estimated ejection   fraction was in the range of 60% to 65%. Features are consistent   with a pseudonormal left ventricular filling pattern, with   concomitant abnormal relaxation and increased filling pressure   (grade 2 diastolic dysfunction). - Aortic valve: Transvalvular velocity was within the normal range.   There was no stenosis. There was no regurgitation. - Mitral valve: There was  trivial regurgitation. - Right ventricle: The cavity size was normal. Wall thickness was   normal. Systolic function was normal. - Atrial septum: No defect or patent foramen ovale was identified. - Tricuspid valve: There was no regurgitation. - Inferior vena cava: The vessel was normal in size. The   respirophasic diameter changes were in the normal range (>= 50%),   consistent with normal central venous pressure.    ASSESSMENT:    1. SOB (shortness of breath)   2. Diaphragm paralysis      PLAN:  In order of problems listed above:  Grade 2 diastolic dysfunction  - Personally viewed echocardiogram, her diastolic parameters are not necessarily indicative of elevated left atrial filling pressures. I do not think that she has grade  2 diastolic dysfunction. Her left atrial size is normal. With grade 2 diastolic dysfunction, the left atrium demonstrates dilation which we are not seeing. Her heart size is normal on echocardiogram. No evidence of cardiomegaly. Reassurance. Normal EF. Excellent blood pressure control. On her chest x-ray, since her right hemidiaphragm was elevated, this likely shifted her heart over to the left side which made it look enlarged.  Dyspnea  - This could be result of markedly elevated right hemidiaphragm that was seen in May. Interestingly, on exam today her breath sounds sound fairly equal at the bases. I will repeat chest x-ray to see if there has been resolution of this right hemidiaphragm elevation. I explained her that sometimes paralysis/stunning of the phrenic nerve can occur. She has not had any stimulation to the phrenic nerve, procedures in the last several months. In fact, her last procedure was in 1994 with lumpectomy and radiation. She has noted some decrease in size of her right breast when compared to left. She also has right arm lymphedema chronic. I wonder if she had spontaneous phrenic nerve irritation causing this elevation in May. If her chest x-ray  still demonstrates elevated right hemidiaphragm, I will refer her to pulmonary for further evaluation. Perhaps she will need an expiratory and inspiratory film. Remember, her x-ray in 2005 which is 11 years after her breast surgery/radiation showed a normal hemidiaphragm.  History of breast cancer  - Stable. Per primary team. She wonders why she is having decrease in her right breast size.     Medication Adjustments/Labs and Tests Ordered: Current medicines are reviewed at length with the patient today.  Concerns regarding medicines are outlined above.  Medication changes, Labs and Tests ordered today are listed in the Patient Instructions below. Patient Instructions  Medication Instructions:  The current medical regimen is effective;  continue present plan and medications.  Testing/Procedures: A chest x-ray takes a picture of the organs and structures inside the chest, including the heart, lungs, and blood vessels. This test can show several things, including, whether the heart is enlarges; whether fluid is building up in the lungs; and whether pacemaker / defibrillator leads are still in place.  Follow-Up: As needed with Dr Marlou Porch.  Thank you for choosing [facility]!!        Signed, Candee Furbish, MD  08/16/2016 3:19 PM    [address], [place], [place]  [address] Phone: [phone]; Fax: [phone]

## 2016-08-16 NOTE — Patient Instructions (Signed)
Medication Instructions:  The current medical regimen is effective;  continue present plan and medications.  Testing/Procedures: A chest x-ray takes a picture of the organs and structures inside the chest, including the heart, lungs, and blood vessels. This test can show several things, including, whether the heart is enlarges; whether fluid is building up in the lungs; and whether pacemaker / defibrillator leads are still in place.  Follow-Up: As needed with Dr Marlou Porch.  Thank you for choosing [facility]!!

## 2016-08-17 ENCOUNTER — Other Ambulatory Visit: Payer: Self-pay | Admitting: *Deleted

## 2016-08-17 DIAGNOSIS — J986 Disorders of diaphragm: Secondary | ICD-10-CM

## 2016-08-17 NOTE — Progress Notes (Signed)
Pt aware she will be called with an appt.

## 2016-09-01 ENCOUNTER — Other Ambulatory Visit: Payer: Self-pay | Admitting: Family Medicine

## 2016-09-01 DIAGNOSIS — I10 Essential (primary) hypertension: Secondary | ICD-10-CM

## 2016-09-02 ENCOUNTER — Other Ambulatory Visit (INDEPENDENT_AMBULATORY_CARE_PROVIDER_SITE_OTHER): Payer: BC Managed Care – PPO

## 2016-09-02 DIAGNOSIS — E785 Hyperlipidemia, unspecified: Secondary | ICD-10-CM

## 2016-09-02 LAB — LIPID PANEL
CHOL/HDL RATIO: 3
Cholesterol: 123 mg/dL (ref 0–200)
HDL: 45.4 mg/dL (ref >39.00)
LDL CALC: 59 mg/dL (ref 0–99)
NONHDL: 77.24
TRIGLYCERIDES: 92 mg/dL (ref 0.0–149.0)
VLDL: 18.4 mg/dL (ref 0.0–40.0)

## 2016-09-02 LAB — HEPATIC FUNCTION PANEL
ALBUMIN: 3.8 g/dL (ref 3.5–5.2)
ALT: 21 U/L (ref 0–35)
AST: 20 U/L (ref 0–37)
Alkaline Phosphatase: 60 U/L (ref 39–117)
BILIRUBIN DIRECT: 0.1 mg/dL (ref 0.0–0.3)
TOTAL PROTEIN: 7 g/dL (ref 6.0–8.3)
Total Bilirubin: 0.5 mg/dL (ref 0.2–1.2)

## 2016-09-17 ENCOUNTER — Ambulatory Visit (INDEPENDENT_AMBULATORY_CARE_PROVIDER_SITE_OTHER): Payer: BC Managed Care – PPO | Admitting: Internal Medicine

## 2016-09-17 ENCOUNTER — Encounter: Payer: Self-pay | Admitting: Internal Medicine

## 2016-09-17 VITALS — BP 118/82 | HR 78 | Ht 66.0 in | Wt 207.4 lb

## 2016-09-17 DIAGNOSIS — R0600 Dyspnea, unspecified: Secondary | ICD-10-CM

## 2016-09-17 DIAGNOSIS — R9389 Abnormal findings on diagnostic imaging of other specified body structures: Secondary | ICD-10-CM

## 2016-09-17 DIAGNOSIS — D6851 Activated protein C resistance: Secondary | ICD-10-CM | POA: Diagnosis not present

## 2016-09-17 DIAGNOSIS — R938 Abnormal findings on diagnostic imaging of other specified body structures: Secondary | ICD-10-CM

## 2016-09-17 MED ORDER — FAMOTIDINE 20 MG PO TABS: ORAL_TABLET | ORAL | 2 refills | Status: DC

## 2016-09-17 MED ORDER — PANTOPRAZOLE SODIUM 40 MG PO TBEC: 40.0000 mg | DELAYED_RELEASE_TABLET | Freq: Every day | ORAL | 2 refills | Status: DC

## 2016-09-17 NOTE — Patient Instructions (Signed)
Pantoprazole (protonix) 40 mg   Take  30-60 min before first meal of the day and Pepcid (famotidine)  20 mg one @  bedtime until return to office - this is the best way to tell whether stomach acid is contributing to your problem.    GERD (REFLUX)  is an extremely common cause of respiratory symptoms just like yours , many times with no obvious heartburn at all.    It can be treated with medication, but also with lifestyle changes including elevation of the head of your bed (ideally with 6 inch  bed blocks),  Smoking cessation, avoidance of late meals, excessive alcohol, and avoid fatty foods, chocolate, peppermint, colas, red wine, and acidic juices such as orange juice.  NO MINT OR MENTHOL PRODUCTS SO NO COUGH DROPS   USE SUGARLESS CANDY INSTEAD (Jolley ranchers or Stover's or Life Savers) or even ice chips will also do - the key is to swallow to prevent all throat clearing. NO OIL BASED VITAMINS - use powdered substitutes.   Please schedule a follow up office visit in 6 weeks, call sooner if needed

## 2016-09-17 NOTE — Progress Notes (Signed)
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   regular walker up to 2 miles per day but avoids in winter and in spring 2017 when picked up ex more sob up hills/ acutely worse on  05/05/16 at rest assoc dizzy/ nausea  > ER > elevated HD > no rx for breathing and improved to point where back out walking but not back to baseline doe so referred to pulmonary clinic 09/17/2016 by Dr   Marlou Porch after neg cards w/u except for Grad II diastolic dysfunction/ Nl LA.   09/17/2016 1st [facility] office visit/ Lillyen Schow  Re abn cxr  Chief Complaint  Patient presents with  . Pulmonary Consult    Referred by Dr. Candee Furbish for elevated right hemidiaphragm. Pt c/o SOB for the past 2 yrs, worse over the past year. She states she gets winded when she bends over and sometimes with showering.   onset of doe x steps/ dry cough x 2 years sometimes at night requiring cough drops then abruptly worse with ER eval with dizziness and back walking 2 miles daily and some hills s stopping - still sob bending over sometimes with cough which  is mostly at night but also some daytime  - coughs two nights per week/ one day on avg x 2 years    No obvious day to day or daytime variability or assoc excess/ purulent sputum or mucus plugs or hemoptysis or cp or chest tightness, subjective wheeze or overt sinus or hb symptoms. No unusual exp hx or h/o childhood pna/ asthma or knowledge of premature birth.  Sleeping ok without nocturnal  or early am exacerbation  of respiratory  c/o's or need for noct saba. Also denies any obvious fluctuation of symptoms with weather or environmental changes or other aggravating or alleviating factors except as outlined above   Current Medications, Allergies, Complete Past Medical History, Past Surgical History, Family History, and Social History were reviewed in [employer] record.  ROS  The following are not active complaints unless bolded sore throat, dysphagia, dental problems, itching, sneezing,  nasal congestion or excess/ purulent secretions, ear ache,   fever, chills, sweats, unintended wt loss, classically pleuritic or exertional cp,  orthopnea pnd or leg swelling, presyncope, palpitations, abdominal pain, anorexia, nausea, vomiting, diarrhea  or change in bowel or bladder habits, change in stools or urine, dysuria,hematuria,  rash, arthralgias, visual complaints, headache, numbness, weakness or ataxia or problems with walking or coordination,  change in mood/affect or memory.           Review of Systems     Objective:   Physical Exam    amb anxious wf with rapid speech   Wt Readings from Last 3 Encounters:  09/17/16 207 lb 6.4 oz (94.1 kg)  08/16/16 207 lb (93.9 kg)  05/31/16 210 lb 3.2 oz (95.3 kg)    Vital signs reviewed - sats 98% RA on arrival   HEENT: nl dentition, turbinates, and oropharynx. Nl external ear canals without cough reflex   NECK :  without JVD/Nodes/TM/ nl carotid upstrokes bilaterally   LUNGS: no acc muscle use,  Nl contour chest which is clear to A and P bilaterally without cough on insp or exp maneuvers   CV:  RRR  no s3 or murmur or increase in P2, no edema  ABD:  soft and nontender with nl inspiratory excursion in the supine position. No bruits or organomegaly, bowel sounds nl  MS:  Nl gait/ ext warm without deformities, calf tenderness, cyanosis or clubbing No obvious joint restrictions   SKIN: warm and dry without lesions    NEURO:  alert, approp, nl sensorium with  no motor deficits       I personally reviewed images and agree with radiology impression as follows:  CXR:   08/16/16 There is no edema or consolidation. The heart size and pulmonary vascularity are normal. No adenopathy. Patient has had a mastectomy on the right with surgical clips the right axillary region. No bone lesions  evident. There is mid thoracic dextroscoliosis.  cxr done 05/05/16  in ER in question was taken at 45 degrees         Assessment:

## 2016-09-17 NOTE — Assessment & Plan Note (Signed)
Transient elevation of cxr 05/05/16 not a true erect PA and Lateral   She has had RT to right chest completed in 1994 but her cxr now is nl and her ex tol is nearly baseline so no further w/u needed for now

## 2016-09-17 NOTE — Assessment & Plan Note (Signed)
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   These are the same pts (now being labeled as having "irritable larynx syndrome" by some cough centers) who not infrequently have a history of having failed to tolerate ace inhibitors,  dry powder inhalers or biphosphonates or report having atypical/extraesophageal reflux symptoms that don't respond to standard doses of PPI  and are easily confused as having aecopd or asthma flares by even experienced allergists/ pulmonologists (myself included).   rec first rx for gerd then return to office in 4 weeks    Total time devoted to counseling  = 35/67mreview case with pt/ discussion of options/alternatives/ personally creating written instructions  in presence of pt  then going over those specific  Instructions directly with the pt including how to use all of the meds but in particular covering each new medication in detail and the difference between the maintenance/automatic meds and the prns using an action plan format for the latter.

## 2016-09-17 NOTE — Assessment & Plan Note (Addendum)
Noted in hx and would have been worrisome for PE if sob at rest assoc with elevated HD but clearly resolved s Rx and back to near nl ex tolerance so no further w/u needed, just higher index of suspicion in future if has unexplained sudden sob for any reason.

## 2016-10-12 ENCOUNTER — Ambulatory Visit (INDEPENDENT_AMBULATORY_CARE_PROVIDER_SITE_OTHER): Payer: BC Managed Care – PPO | Admitting: Behavioral Health

## 2016-10-12 DIAGNOSIS — Z23 Encounter for immunization: Secondary | ICD-10-CM | POA: Diagnosis not present

## 2016-10-24 ENCOUNTER — Other Ambulatory Visit: Payer: Self-pay | Admitting: Family Medicine

## 2016-10-26 NOTE — Telephone Encounter (Signed)
Refills sent for pt. Pt is due for 6 month follow up with PCP on 12/02/16.  Mychart message sent to pt.

## 2016-10-29 ENCOUNTER — Encounter: Payer: Self-pay | Admitting: Internal Medicine

## 2016-10-29 ENCOUNTER — Ambulatory Visit (INDEPENDENT_AMBULATORY_CARE_PROVIDER_SITE_OTHER): Payer: BC Managed Care – PPO | Admitting: Internal Medicine

## 2016-10-29 ENCOUNTER — Other Ambulatory Visit: Payer: BC Managed Care – PPO

## 2016-10-29 VITALS — BP 124/80 | HR 65 | Ht 66.0 in | Wt 206.5 lb

## 2016-10-29 DIAGNOSIS — K219 Gastro-esophageal reflux disease without esophagitis: Secondary | ICD-10-CM

## 2016-10-29 DIAGNOSIS — R0600 Dyspnea, unspecified: Secondary | ICD-10-CM

## 2016-10-29 LAB — D-DIMER, QUANTITATIVE: D-Dimer, Quant: 0.28 mcg/mL FEU (ref <0.50)

## 2016-10-29 NOTE — Patient Instructions (Addendum)
The evista and your inherited clotting issues put you at risk for clots in the future but no evidence you have them now   No change in reflux medications for now and keep working on weight loss  Please remember to go to the lab  department downstairs for your tests - we will call you with the results when they are available.  Please schedule a follow up visit in 3 months but call sooner if needed

## 2016-10-29 NOTE — Progress Notes (Signed)
Subjective:     Patient ID: Tracy Thompson, female   DOB: Dec 02, 1951,    MRN: [id number]    Brief patient profile:  16 yowf never smoker with FV  leiden heterozygote ? Also 8 def?   regular walker up to 2 miles per day but avoids in winter and in spring 2017 when picked up ex more sob up hills/ acutely worse on  05/05/16 at rest assoc dizzy/ nausea  > ER > elevated HD > no rx for breathing and improved to point where back out walking but not back to baseline doe so referred to pulmonary clinic 09/17/2016 by Dr   Marlou Porch after neg cards w/u except for Grad II diastolic dysfunction/ Nl LA.     History of Present Illness  09/17/2016 1st [facility] office visit/ Vander Kueker  Re abn cxr  Chief Complaint  Patient presents with  . Pulmonary Consult    Referred by Dr. Candee Furbish for elevated right hemidiaphragm. Pt c/o SOB for the past 2 yrs, worse over the past year. She states she gets winded when she bends over and sometimes with showering.   onset of doe x steps/ dry cough x 2 years sometimes at night requiring cough drops then abruptly worse with ER eval with dizziness and back walking 2 miles daily and some hills s stopping - still sob bending over sometimes with cough which  is mostly at night but also some daytime  - coughs two nights per week/ one day on avg x 2 years  rec Pantoprazole (protonix) 40 mg   Take  30-60 min before first meal of the day and Pepcid (famotidine)  20 mg one @  bedtime until return to office - this is the best way to tell whether stomach acid is contributing to your problem.   GERD diet    10/29/2016  f/u ov/Dejai Schubach re: unexplained doe and cough on evista with Factor V leiden/8 abnormalities/ better on gerd rx  Chief Complaint  Patient presents with  . Follow-up    Breathing has improved some, but not back at baseline. She is no longer waking up coughing and SOB.   walking up to 40 min per day 5-6 / week she slows husband down in terms of pace, but hills easier  and not  needing to stop  No noct cough at all now   No obvious day to day or daytime variability or assoc excess/ purulent sputum or mucus plugs or hemoptysis or cp or chest tightness, subjective wheeze or overt sinus or hb symptoms. No unusual exp hx or h/o childhood pna/ asthma or knowledge of premature birth.  Sleeping ok without nocturnal  or early am exacerbation  of respiratory  c/o's or need for noct saba. Also denies any obvious fluctuation of symptoms with weather or environmental changes or other aggravating or alleviating factors except as outlined above   Current Medications, Allergies, Complete Past Medical History, Past Surgical History, Family History, and Social History were reviewed in [employer] record.  ROS  The following are not active complaints unless bolded sore throat, dysphagia, dental problems, itching, sneezing,  nasal congestion or excess/ purulent secretions, ear ache,   fever, chills, sweats, unintended wt loss, classically pleuritic or exertional cp,  orthopnea pnd or leg swelling, presyncope, palpitations, abdominal pain, anorexia, nausea, vomiting, diarrhea  or change in bowel or bladder habits, change in stools or urine, dysuria,hematuria,  rash, arthralgias, visual complaints, headache, numbness, weakness or ataxia or problems with  walking or coordination,  change in mood/affect or memory.                Objective:   Physical Exam    amb pleasant wf/ all smiles       10/29/2016        206   09/17/16 207 lb 6.4 oz (94.1 kg)  08/16/16 207 lb (93.9 kg)  05/31/16 210 lb 3.2 oz (95.3 kg)    Vital signs reviewed - sats 97% RA on arrival   HEENT: nl dentition, turbinates, and oropharynx. Nl external ear canals without cough reflex   NECK :  without JVD/Nodes/TM/ nl carotid upstrokes bilaterally   LUNGS: no acc muscle use,  Nl contour chest which is clear to A and P bilaterally without cough on insp or exp maneuvers   CV:  RRR  no s3  or murmur or increase in P2, no edema   ABD:  soft and nontender with nl inspiratory excursion in the supine position. No bruits or organomegaly, bowel sounds nl  MS:  Nl gait/ ext warm without deformities, calf tenderness, cyanosis or clubbing No obvious joint restrictions   SKIN: warm and dry without lesions    NEURO:  alert, approp, nl sensorium with  no motor deficits       I personally reviewed images and agree with radiology impression as follows:  CXR:   08/16/16 There is no edema or consolidation. The heart size and pulmonary vascularity are normal. No adenopathy. Patient has had a mastectomy on the right with surgical clips the right axillary region. No bone lesions evident. There is mid thoracic dextroscoliosis.      Lab Results  Component Value Date   DDIMER 0.28 10/29/2016       Assessment:

## 2016-10-31 DIAGNOSIS — K219 Gastro-esophageal reflux disease without esophagitis: Secondary | ICD-10-CM | POA: Insufficient documentation

## 2016-10-31 NOTE — Assessment & Plan Note (Addendum)
Body mass index is 33.33   Lab Results  Component Value Date   TSH 4.22 05/31/2016     Contributing to gerd tendency/DVT and PE Risk/  doe/reviewed the need and the process to achieve and maintain neg calorie balance > defer f/u primary care including intermittently monitoring thyroid status

## 2016-10-31 NOTE — Assessment & Plan Note (Signed)
Onset x 2015 assoc with dry cough > rec gerd rx  09/17/2016 > improved 10/29/2016  - D dimer baseline 0.28 10/29/2016 done because of FV Leiden mutation/ F 8 def  Better on gerd rx with no current evidence to support dvt / pe (D dimer nl - while  A nl valute  may miss small peripheral pe, the clot burden with sob is moderately high and the d dimer has a very high neg pred value in the setting of chronic doe).    However, I am concerned about the risk of dvt/PE going forward in this pt with genetic risk and Evista plus obesity and rec either stop the evista at this point or refer to Dr Sanjuana Letters, an expert in [place] and clotting issues  I had an extended discussion with the patient reviewing all relevant studies completed to date and  lasting 15 to 20 minutes of a 25 minute visit    Each maintenance medication was reviewed in detail including most importantly the difference between maintenance and prns and under what circumstances the prns are to be triggered using an action plan format that is not reflected in the computer generated alphabetically organized AVS.    Please see instructions for details which were reviewed in writing and the patient given a copy highlighting the part that I personally wrote and discussed at today's ov.

## 2016-10-31 NOTE — Assessment & Plan Note (Signed)
Discussed in detail all the  indications, usual  risks and alternatives  relative to the benefits with patient who agrees to proceed with conservative f/u as outlined  With 3 m rx then consider simplify rx/ taper off ppi

## 2016-11-01 ENCOUNTER — Telehealth: Payer: Self-pay | Admitting: Internal Medicine

## 2016-11-01 NOTE — Telephone Encounter (Signed)
Noted- PT does not need call back - Reviewed results on My Chart

## 2016-11-01 NOTE — Progress Notes (Signed)
lmtcb

## 2016-11-23 ENCOUNTER — Other Ambulatory Visit: Payer: Self-pay | Admitting: Family Medicine

## 2016-12-06 ENCOUNTER — Other Ambulatory Visit: Payer: Self-pay | Admitting: Family Medicine

## 2016-12-06 DIAGNOSIS — I10 Essential (primary) hypertension: Secondary | ICD-10-CM

## 2016-12-07 ENCOUNTER — Encounter: Payer: Self-pay | Admitting: Family Medicine

## 2016-12-07 ENCOUNTER — Ambulatory Visit (INDEPENDENT_AMBULATORY_CARE_PROVIDER_SITE_OTHER): Payer: BC Managed Care – PPO | Admitting: Family Medicine

## 2016-12-07 VITALS — BP 124/80 | HR 78 | Temp 98.1°F | Ht 66.0 in | Wt 205.0 lb

## 2016-12-07 DIAGNOSIS — H8110 Benign paroxysmal vertigo, unspecified ear: Secondary | ICD-10-CM | POA: Diagnosis not present

## 2016-12-07 DIAGNOSIS — E785 Hyperlipidemia, unspecified: Secondary | ICD-10-CM

## 2016-12-07 DIAGNOSIS — I10 Essential (primary) hypertension: Secondary | ICD-10-CM | POA: Diagnosis not present

## 2016-12-07 MED ORDER — MECLIZINE HCL 12.5 MG PO TABS: 12.5000 mg | ORAL_TABLET | Freq: Three times a day (TID) | ORAL | 0 refills | Status: DC | PRN

## 2016-12-07 MED ORDER — BISOPROLOL-HYDROCHLOROTHIAZIDE 10-6.25 MG PO TABS: 1.0000 | ORAL_TABLET | Freq: Every day | ORAL | 1 refills | Status: DC

## 2016-12-07 MED ORDER — ATORVASTATIN CALCIUM 10 MG PO TABS: 10.0000 mg | ORAL_TABLET | Freq: Every day | ORAL | 5 refills | Status: DC

## 2016-12-07 MED ORDER — HYDROCHLOROTHIAZIDE 25 MG PO TABS: 25.0000 mg | ORAL_TABLET | Freq: Every day | ORAL | 1 refills | Status: DC

## 2016-12-07 MED ORDER — AMLODIPINE BESYLATE 2.5 MG PO TABS: 2.5000 mg | ORAL_TABLET | Freq: Every day | ORAL | 2 refills | Status: DC

## 2016-12-07 NOTE — Progress Notes (Signed)
Pre visit review using our clinic review tool, if applicable. No additional management support is needed unless otherwise documented below in the visit note. 

## 2016-12-07 NOTE — Patient Instructions (Signed)

## 2016-12-07 NOTE — Progress Notes (Signed)
Patient ID: Tracy Thompson, female    DOB: 10-10-1951  Age: 65 y.o. MRN: [id number]    Subjective:  Subjective  HPI EMMANUELLE HIBBITTS presents for f/u bp , cholesterol.  Review of Systems  Constitutional: Negative for appetite change, diaphoresis, fatigue and unexpected weight change.  Eyes: Negative for pain, redness and visual disturbance.  Respiratory: Negative for cough, chest tightness, shortness of breath and wheezing.   Cardiovascular: Negative for chest pain, palpitations and leg swelling.  Endocrine: Negative for cold intolerance, heat intolerance, polydipsia, polyphagia and polyuria.  Genitourinary: Negative for difficulty urinating, dysuria and frequency.  Neurological: Negative for dizziness, light-headedness, numbness and headaches.  Psychiatric/Behavioral: Negative.     History Past Medical History:  Diagnosis Date  . Breast cancer ([place]) 1994   s/p chemo 8/94-1/95, radiation 8/94-11/94  . Factor 5 Leiden mutation, heterozygous [facility])    also has Factor 8 problems  . Hypertension   . Menopause    chemo induced    She has a past surgical history that includes Breast lumpectomy (Right, 1994); varicose veins (Bilateral, 2013); and Cholecystectomy (06/20/2002).   Her family history includes COPD in her brother and mother; Cancer in her father and mother; Colon cancer in her mother; Deep vein thrombosis in her father; Dementia in her mother; Diabetes in her father; Hepatitis in her mother; Hepatitis C in her brother; Hyperlipidemia in her mother; Hypertension in her father and mother; Mental illness in her brother; Prostate cancer in her father; Pulmonary embolism in her father; Schizophrenia in her brother; Stroke in her father and mother.She reports that she has never smoked. She has never used smokeless tobacco. She reports that she does not drink alcohol or use drugs.  Current Outpatient Prescriptions on File Prior to Visit  Medication Sig Dispense Refill  . aspirin 81 MG  tablet Take 81 mg by mouth daily.      . cholecalciferol (VITAMIN D) 1000 units tablet Take 2,000 Units by mouth daily.    . Coenzyme Q10 200 MG capsule Take 200 mg by mouth daily.    . famotidine (PEPCID) 20 MG tablet One at bedtime 30 tablet 2  . pantoprazole (PROTONIX) 40 MG tablet Take 1 tablet (40 mg total) by mouth daily. Take 30-60 min before first meal of the day 30 tablet 2  . raloxifene (EVISTA) 60 MG tablet take 1 tablet by mouth once daily 90 tablet 1   No current facility-administered medications on file prior to visit.      Objective:  Objective  Physical Exam  Constitutional: She is oriented to person, place, and time. She appears well-developed and well-nourished.  HENT:  Head: Normocephalic and atraumatic.  Eyes: Conjunctivae and EOM are normal.  Neck: Normal range of motion. Neck supple. No JVD present. Carotid bruit is not present. No thyromegaly present.  Cardiovascular: Normal rate, regular rhythm and normal heart sounds.   No murmur heard. Pulmonary/Chest: Effort normal and breath sounds normal. No respiratory distress. She has no wheezes. She has no rales. She exhibits no tenderness.  Abdominal: Soft. She exhibits no distension. There is no tenderness. There is no rebound, no guarding and no CVA tenderness.  Genitourinary: Pelvic exam was performed with patient supine. There is no rash, tenderness, lesion or injury on the right labia. There is no rash, tenderness, lesion or injury on the left labia. No erythema or tenderness in the vagina.  Musculoskeletal: She exhibits edema.  Edema-- L >R   Neurological: She is alert and oriented to person,  place, and time.  Psychiatric: She has a normal mood and affect.   BP 124/80 (BP Location: Left Arm, Patient Position: Sitting, Cuff Size: Normal)   Pulse 78   Temp 98.1 F (36.7 C) (Oral)   Ht '5\' 6"'$  (1.676 m)   Wt 205 lb (93 kg)   SpO2 98%   BMI 33.09 kg/m  Wt Readings from Last 3 Encounters:  12/07/16 205 lb (93 kg)   10/29/16 206 lb 8 oz (93.7 kg)  09/17/16 207 lb 6.4 oz (94.1 kg)     Lab Results  Component Value Date   WBC 8.9 05/05/2016   HGB 14.7 05/05/2016   HCT 44.6 05/05/2016   PLT 290 05/05/2016   GLUCOSE 104 (H) 05/31/2016   CHOL 123 09/02/2016   TRIG 92.0 09/02/2016   HDL 45.40 09/02/2016   LDLDIRECT 135.7 02/11/2012   LDLCALC 59 09/02/2016   ALT 21 09/02/2016   AST 20 09/02/2016   NA 141 05/31/2016   K 3.4 (L) 05/31/2016   CL 101 05/31/2016   CREATININE 0.84 05/31/2016   BUN 13 05/31/2016   CO2 31 05/31/2016   TSH 4.22 05/31/2016   HGBA1C 6.5 10/03/2013   MICROALBUR <0.7 05/31/2016    Dg Chest 2 View  Result Date: 08/16/2016 CLINICAL DATA:  Shortness of breath. History of right-sided breast carcinoma EXAM: CHEST  2 VIEW COMPARISON:  May 05, 2016 FINDINGS: There is no edema or consolidation. The heart size and pulmonary vascularity are normal. No adenopathy. Patient has had a mastectomy on the right with surgical clips the right axillary region. No bone lesions evident. There is mid thoracic dextroscoliosis. IMPRESSION: No edema or consolidation. No adenopathy evident. Status post right mastectomy. Electronically Signed   By: Lowella Grip III M.D.   On: 08/16/2016 15:44     Assessment & Plan:  Plan  I have changed Ms. Stebner's amLODipine, atorvastatin, bisoprolol-hydrochlorothiazide, and hydrochlorothiazide. I am also having her maintain her aspirin, Coenzyme Q10, cholecalciferol, famotidine, pantoprazole, raloxifene, and meclizine.  Meds ordered this encounter  Medications  . amLODipine (NORVASC) 2.5 MG tablet    Sig: Take 1 tablet (2.5 mg total) by mouth daily.    Dispense:  30 tablet    Refill:  2  . atorvastatin (LIPITOR) 10 MG tablet    Sig: Take 1 tablet (10 mg total) by mouth daily.    Dispense:  30 tablet    Refill:  5  . bisoprolol-hydrochlorothiazide (ZIAC) 10-6.25 MG tablet    Sig: Take 1 tablet by mouth daily.    Dispense:  90 tablet    Refill:  1     D/C PREVIOUS SCRIPTS FOR THIS MEDICATION  . hydrochlorothiazide (HYDRODIURIL) 25 MG tablet    Sig: Take 1 tablet (25 mg total) by mouth daily.    Dispense:  90 tablet    Refill:  1  . meclizine (ANTIVERT) 12.5 MG tablet    Sig: Take 1 tablet (12.5 mg total) by mouth 3 (three) times daily as needed for dizziness.    Dispense:  30 tablet    Refill:  0    Problem List Items Addressed This Visit      Unprioritized   BENIGN POSITIONAL VERTIGO   Relevant Medications   meclizine (ANTIVERT) 12.5 MG tablet   Essential hypertension - Primary   Relevant Medications   amLODipine (NORVASC) 2.5 MG tablet   atorvastatin (LIPITOR) 10 MG tablet   bisoprolol-hydrochlorothiazide (ZIAC) 10-6.25 MG tablet   hydrochlorothiazide (HYDRODIURIL) 25 MG tablet  Other Visit Diagnoses    Hyperlipidemia, unspecified hyperlipidemia type       Relevant Medications   amLODipine (NORVASC) 2.5 MG tablet   atorvastatin (LIPITOR) 10 MG tablet   bisoprolol-hydrochlorothiazide (ZIAC) 10-6.25 MG tablet   hydrochlorothiazide (HYDRODIURIL) 25 MG tablet      Follow-up: Return in about 6 months (around 06/07/2017) for annual exam, fasting.  [name], DO

## 2016-12-09 MED ORDER — ATORVASTATIN CALCIUM 10 MG PO TABS: 10.0000 mg | ORAL_TABLET | Freq: Every day | ORAL | 5 refills | Status: DC

## 2016-12-09 MED ORDER — BISOPROLOL-HYDROCHLOROTHIAZIDE 10-6.25 MG PO TABS: 1.0000 | ORAL_TABLET | Freq: Every day | ORAL | 1 refills | Status: DC

## 2016-12-09 MED ORDER — HYDROCHLOROTHIAZIDE 25 MG PO TABS: 25.0000 mg | ORAL_TABLET | Freq: Every day | ORAL | 1 refills | Status: DC

## 2016-12-09 MED ORDER — AMLODIPINE BESYLATE 2.5 MG PO TABS: 2.5000 mg | ORAL_TABLET | Freq: Every day | ORAL | 2 refills | Status: DC

## 2016-12-14 ENCOUNTER — Other Ambulatory Visit: Payer: Self-pay | Admitting: Internal Medicine

## 2017-01-31 ENCOUNTER — Ambulatory Visit: Payer: BC Managed Care – PPO | Admitting: Internal Medicine

## 2017-02-11 ENCOUNTER — Encounter: Payer: Self-pay | Admitting: Family Medicine

## 2017-02-11 ENCOUNTER — Telehealth: Payer: Self-pay | Admitting: *Deleted

## 2017-02-11 ENCOUNTER — Ambulatory Visit (INDEPENDENT_AMBULATORY_CARE_PROVIDER_SITE_OTHER): Payer: BC Managed Care – PPO | Admitting: Family Medicine

## 2017-02-11 VITALS — BP 106/76 | HR 94 | Temp 99.0°F | Resp 16 | Ht 66.0 in | Wt 204.4 lb

## 2017-02-11 DIAGNOSIS — L03113 Cellulitis of right upper limb: Secondary | ICD-10-CM | POA: Diagnosis not present

## 2017-02-11 DIAGNOSIS — I890 Lymphedema, not elsewhere classified: Secondary | ICD-10-CM

## 2017-02-11 MED ORDER — CEFTRIAXONE SODIUM 1 G IJ SOLR
1.0000 g | Freq: Once | INTRAMUSCULAR | Status: AC
  Administered 2017-02-11: 1 g via INTRAMUSCULAR

## 2017-02-11 MED ORDER — CEPHALEXIN 500 MG PO CAPS: 500.0000 mg | ORAL_CAPSULE | Freq: Four times a day (QID) | ORAL | 0 refills | Status: DC

## 2017-02-11 NOTE — Progress Notes (Signed)
Subjective:  I acted as a Education administrator for Dr. Carollee Herter.  Guerry Bruin, [place]   Patient ID: Tracy Thompson, female    DOB: 1951-06-06, 66 y.o.   MRN: [id number]  Chief Complaint  Patient presents with  . Edema    right arm swollen.  started feeling bad last night.    HPI Patient is in today for right arm swollen since last night.   Started feeling yuckey last night.   Has history of lymphedema.  Right arm feels warm to the touch.  Nicked finger on right hand 2 days ago and arm was red next day.  no fever, no pain in arm.  This has happened many times before pt was given an abx and it got better.    Past Medical History:  Diagnosis Date  . Breast cancer ([place]) 1994   s/p chemo 8/94-1/95, radiation 8/94-11/94  . Factor 5 Leiden mutation, heterozygous [facility])    also has Factor 8 problems  . Hypertension   . Menopause    chemo induced    Past Surgical History:  Procedure Laterality Date  . BREAST LUMPECTOMY Right 1994   w/ radiation and chemotherapy following lumpectomy  . CHOLECYSTECTOMY  06/20/2002   Laparoscopic cholestectomy  . varicose veins Bilateral 2013   laser surgery   Dr. Jones Skene    Family History  Problem Relation Age of Onset  . Cancer Mother     Bladder CA, colon  . Colon cancer Mother   . Stroke Mother   . Hypertension Mother   . Hyperlipidemia Mother   . Hepatitis Mother     hep A  . COPD Mother   . Dementia Mother   . Hypertension Father   . Prostate cancer Father   . Diabetes Father   . Stroke Father   . Pulmonary embolism Father   . Deep vein thrombosis Father   . Cancer Father     prostate cancer  . Schizophrenia Brother   . Mental illness Brother   . COPD Brother   . Hepatitis C Brother   . Liver disease      Social History   Social History  . Marital status: Married    Spouse name: N/A  . Number of children: N/A  . Years of education: N/A   Occupational History  . retired Pharmacist, hospital Retired   Social History Main Topics  . Smoking  status: Never Smoker  . Smokeless tobacco: Never Used  . Alcohol use No  . Drug use: No  . Sexual activity: Yes    Partners: Male   Other Topics Concern  . Not on file   Social History Narrative   Exercise--- walking , weights    Outpatient Medications Prior to Visit  Medication Sig Dispense Refill  . amLODipine (NORVASC) 2.5 MG tablet Take 1 tablet (2.5 mg total) by mouth daily. 30 tablet 2  . aspirin 81 MG tablet Take 81 mg by mouth daily.      Marland Kitchen atorvastatin (LIPITOR) 10 MG tablet Take 1 tablet (10 mg total) by mouth daily. 30 tablet 5  . bisoprolol-hydrochlorothiazide (ZIAC) 10-6.25 MG tablet Take 1 tablet by mouth daily. 90 tablet 1  . cholecalciferol (VITAMIN D) 1000 units tablet Take 2,000 Units by mouth daily.    . Coenzyme Q10 200 MG capsule Take 200 mg by mouth daily.    . famotidine (PEPCID) 20 MG tablet take 1 tablet by mouth at bedtime 30 tablet 2  . hydrochlorothiazide (HYDRODIURIL) 25 MG tablet  Take 1 tablet (25 mg total) by mouth daily. 90 tablet 1  . meclizine (ANTIVERT) 12.5 MG tablet Take 1 tablet (12.5 mg total) by mouth 3 (three) times daily as needed for dizziness. 30 tablet 0  . pantoprazole (PROTONIX) 40 MG tablet take 1 tablet by mouth once daily 30-60 MINUTES BEFORE THE FIRST MEAL OF THE DAY 30 tablet 2  . raloxifene (EVISTA) 60 MG tablet take 1 tablet by mouth once daily 90 tablet 1   No facility-administered medications prior to visit.     No Known Allergies  Review of Systems  Constitutional: Negative for chills, fever and malaise/fatigue.  HENT: Negative for congestion and hearing loss.   Eyes: Negative for discharge.  Respiratory: Negative for cough, sputum production and shortness of breath.   Cardiovascular: Negative for chest pain, palpitations and leg swelling.  Gastrointestinal: Negative for abdominal pain, blood in stool, constipation, diarrhea, heartburn, nausea and vomiting.  Genitourinary: Negative for dysuria, frequency, hematuria and  urgency.  Musculoskeletal: Negative for back pain, falls and myalgias.  Skin: Negative for rash.  Neurological: Negative for dizziness, sensory change, loss of consciousness, weakness and headaches.  Endo/Heme/Allergies: Negative for environmental allergies. Does not bruise/bleed easily.  Psychiatric/Behavioral: Negative for depression and suicidal ideas. The patient is not nervous/anxious and does not have insomnia.        Objective:    Physical Exam  Constitutional: She is oriented to person, place, and time. She appears well-developed and well-nourished.  HENT:  Head: Normocephalic and atraumatic.  Eyes: Conjunctivae and EOM are normal.  Neck: Normal range of motion. Neck supple. No JVD present. Carotid bruit is not present. No thyromegaly present.  Cardiovascular: Normal rate, regular rhythm and normal heart sounds.   No murmur heard. Pulmonary/Chest: Effort normal and breath sounds normal. No respiratory distress. She has no wheezes. She has no rales. She exhibits no tenderness.  Musculoskeletal: She exhibits no edema.  Neurological: She is alert and oriented to person, place, and time.  Skin: There is erythema.     Psychiatric: She has a normal mood and affect.  Nursing note and vitals reviewed.   BP 106/76 (BP Location: Left Arm, Cuff Size: Large)   Pulse 94   Temp 99 F (37.2 C) (Oral)   Resp 16   Ht '5\' 6"'$  (1.676 m)   Wt 204 lb 6.4 oz (92.7 kg)   SpO2 97%   BMI 32.99 kg/m  Wt Readings from Last 3 Encounters:  02/11/17 204 lb 6.4 oz (92.7 kg)  12/07/16 205 lb (93 kg)  10/29/16 206 lb 8 oz (93.7 kg)     Lab Results  Component Value Date   WBC 8.9 05/05/2016   HGB 14.7 05/05/2016   HCT 44.6 05/05/2016   PLT 290 05/05/2016   GLUCOSE 104 (H) 05/31/2016   CHOL 123 09/02/2016   TRIG 92.0 09/02/2016   HDL 45.40 09/02/2016   LDLDIRECT 135.7 02/11/2012   LDLCALC 59 09/02/2016   ALT 21 09/02/2016   AST 20 09/02/2016   NA 141 05/31/2016   K 3.4 (L) 05/31/2016     CL 101 05/31/2016   CREATININE 0.84 05/31/2016   BUN 13 05/31/2016   CO2 31 05/31/2016   TSH 4.22 05/31/2016   HGBA1C 6.5 10/03/2013   MICROALBUR <0.7 05/31/2016    Lab Results  Component Value Date   TSH 4.22 05/31/2016   Lab Results  Component Value Date   WBC 8.9 05/05/2016   HGB 14.7 05/05/2016   HCT 44.6 05/05/2016  MCV 92.7 05/05/2016   PLT 290 05/05/2016   Lab Results  Component Value Date   NA 141 05/31/2016   K 3.4 (L) 05/31/2016   CO2 31 05/31/2016   GLUCOSE 104 (H) 05/31/2016   BUN 13 05/31/2016   CREATININE 0.84 05/31/2016   BILITOT 0.5 09/02/2016   ALKPHOS 60 09/02/2016   AST 20 09/02/2016   ALT 21 09/02/2016   PROT 7.0 09/02/2016   ALBUMIN 3.8 09/02/2016   CALCIUM 9.4 05/31/2016   ANIONGAP 14 05/05/2016   GFR 72.61 05/31/2016   Lab Results  Component Value Date   CHOL 123 09/02/2016   Lab Results  Component Value Date   HDL 45.40 09/02/2016   Lab Results  Component Value Date   LDLCALC 59 09/02/2016   Lab Results  Component Value Date   TRIG 92.0 09/02/2016   Lab Results  Component Value Date   CHOLHDL 3 09/02/2016   Lab Results  Component Value Date   HGBA1C 6.5 10/03/2013       Assessment & Plan:   Problem List Items Addressed This Visit      Unprioritized   Lymphedema - Primary   Relevant Medications   cefTRIAXone (ROCEPHIN) injection 1 g (Completed)   Cellulitis of right arm    Rocephin 1 g IM Keflex 500 mg qid x 10 day F/u 1 week or sooner prn  If any inc in red streaks over weekend -- go to ER         I am having Ms. Solazzo start on cephALEXin. I am also having her maintain her aspirin, Coenzyme Q10, cholecalciferol, raloxifene, meclizine, bisoprolol-hydrochlorothiazide, atorvastatin, amLODipine, hydrochlorothiazide, famotidine, and pantoprazole. We administered cefTRIAXone.  Meds ordered this encounter  Medications  . cephALEXin (KEFLEX) 500 MG capsule    Sig: Take 1 capsule (500 mg total) by mouth 4  (four) times daily.    Dispense:  40 capsule    Refill:  0  . cefTRIAXone (ROCEPHIN) injection 1 g    CMA served as scribe during this visit. History, Physical and Plan performed by medical provider. Documentation and orders reviewed and attested to.  [name], DO

## 2017-02-11 NOTE — Assessment & Plan Note (Signed)
Rocephin 1 g IM Keflex 500 mg qid x 10 day F/u 1 week or sooner prn  If any inc in red streaks over weekend -- go to ER

## 2017-02-11 NOTE — Telephone Encounter (Signed)
Pt called in requesting same day appt w/ PCP only for lymphedema of arm. States she has hx of this and requires antibiotics as it is "life threatening". She endorses redness of the arm and "feeling like crap." Denies fever, no SOB noted. Initially, no appts were available w/ PCP, but a cancellation occurred during call. Pt scheduled w/ PCP at 1pm today. No further questions or concerns at time of call.

## 2017-02-11 NOTE — Patient Instructions (Signed)

## 2017-02-11 NOTE — Progress Notes (Signed)
Pre visit review using our clinic review tool, if applicable. No additional management support is needed unless otherwise documented below in the visit note. 

## 2017-02-18 ENCOUNTER — Ambulatory Visit: Payer: BC Managed Care – PPO | Admitting: Family Medicine

## 2017-03-03 ENCOUNTER — Other Ambulatory Visit: Payer: Self-pay | Admitting: Family Medicine

## 2017-03-03 DIAGNOSIS — I10 Essential (primary) hypertension: Secondary | ICD-10-CM

## 2017-04-27 ENCOUNTER — Ambulatory Visit (INDEPENDENT_AMBULATORY_CARE_PROVIDER_SITE_OTHER): Payer: BC Managed Care – PPO | Admitting: Medical

## 2017-04-27 VITALS — BP 116/69 | HR 76 | Temp 98.3°F | Resp 16 | Ht 66.0 in | Wt 201.2 lb

## 2017-04-27 DIAGNOSIS — E785 Hyperlipidemia, unspecified: Secondary | ICD-10-CM

## 2017-04-27 DIAGNOSIS — H6123 Impacted cerumen, bilateral: Secondary | ICD-10-CM | POA: Diagnosis not present

## 2017-04-27 DIAGNOSIS — I10 Essential (primary) hypertension: Secondary | ICD-10-CM

## 2017-04-27 DIAGNOSIS — H8110 Benign paroxysmal vertigo, unspecified ear: Secondary | ICD-10-CM | POA: Diagnosis not present

## 2017-04-27 MED ORDER — BISOPROLOL-HYDROCHLOROTHIAZIDE 10-6.25 MG PO TABS: 1.0000 | ORAL_TABLET | Freq: Every day | ORAL | 1 refills | Status: DC

## 2017-04-27 MED ORDER — MECLIZINE HCL 12.5 MG PO TABS: 12.5000 mg | ORAL_TABLET | Freq: Three times a day (TID) | ORAL | 0 refills | Status: DC | PRN

## 2017-04-27 MED ORDER — HYDROCHLOROTHIAZIDE 25 MG PO TABS: 25.0000 mg | ORAL_TABLET | Freq: Every day | ORAL | 1 refills | Status: DC

## 2017-04-27 MED ORDER — AMLODIPINE BESYLATE 2.5 MG PO TABS: 2.5000 mg | ORAL_TABLET | Freq: Every day | ORAL | 2 refills | Status: DC

## 2017-04-27 MED ORDER — RALOXIFENE HCL 60 MG PO TABS: 60.0000 mg | ORAL_TABLET | Freq: Every day | ORAL | 1 refills | Status: DC

## 2017-04-27 MED ORDER — ATORVASTATIN CALCIUM 10 MG PO TABS: 10.0000 mg | ORAL_TABLET | Freq: Every day | ORAL | 5 refills | Status: DC

## 2017-04-27 NOTE — Progress Notes (Signed)
Pre visit review using our clinic review tool, if applicable. No additional management support is needed unless otherwise documented below in the visit note. 

## 2017-04-27 NOTE — Patient Instructions (Signed)
Your wax blockage was cleared about 90% on both side. If you get re-accumulation of the wax would recommend debrox soaks for 3-4 days before lavage.   I refilled you medications that you needed refilled.  Follow up as regularly scheduled with pcp or as needed

## 2017-04-27 NOTE — Progress Notes (Signed)
Subjective:    Patient ID: Tracy Thompson, female    DOB: Jun 06, 1951, 66 y.o.   MRN: [id number]  HPI  Pt recently with moderate wax obstruction. Can't hear recently due to wax. Hx of prior wax obstruction. No uri or sinus symptoms.    Review of Systems  Constitutional: Negative for chills, fatigue and fever.  HENT: Negative for congestion, ear pain, hearing loss, postnasal drip, sinus pain and sneezing.        Ear blocked sensation.  Respiratory: Negative for cough, choking, shortness of breath and wheezing.   Cardiovascular: Negative for chest pain and palpitations.  Musculoskeletal: Negative for back pain.  Neurological: Negative for dizziness and headaches.  Hematological: Negative for adenopathy. Does not bruise/bleed easily.  Psychiatric/Behavioral: Negative for behavioral problems and confusion.    Past Medical History:  Diagnosis Date  . Breast cancer ([place]) 1994   s/p chemo 8/94-1/95, radiation 8/94-11/94  . Factor 5 Leiden mutation, heterozygous [facility])    also has Factor 8 problems  . Hypertension   . Menopause    chemo induced     Social History   Social History  . Marital status: Married    Spouse name: N/A  . Number of children: N/A  . Years of education: N/A   Occupational History  . retired Pharmacist, hospital Retired   Social History Main Topics  . Smoking status: Never Smoker  . Smokeless tobacco: Never Used  . Alcohol use No  . Drug use: No  . Sexual activity: Yes    Partners: Male   Other Topics Concern  . Not on file   Social History Narrative   Exercise--- walking , weights    Past Surgical History:  Procedure Laterality Date  . BREAST LUMPECTOMY Right 1994   w/ radiation and chemotherapy following lumpectomy  . CHOLECYSTECTOMY  06/20/2002   Laparoscopic cholestectomy  . varicose veins Bilateral 2013   laser surgery   Dr. Jones Skene    Family History  Problem Relation Age of Onset  . Cancer Mother     Bladder CA, colon  . Colon cancer  Mother   . Stroke Mother   . Hypertension Mother   . Hyperlipidemia Mother   . Hepatitis Mother     hep A  . COPD Mother   . Dementia Mother   . Hypertension Father   . Prostate cancer Father   . Diabetes Father   . Stroke Father   . Pulmonary embolism Father   . Deep vein thrombosis Father   . Cancer Father     prostate cancer  . Schizophrenia Brother   . Mental illness Brother   . COPD Brother   . Hepatitis C Brother   . Liver disease      No Known Allergies  Current Outpatient Prescriptions on File Prior to Visit  Medication Sig Dispense Refill  . aspirin 81 MG tablet Take 81 mg by mouth daily.      . cholecalciferol (VITAMIN D) 1000 units tablet Take 2,000 Units by mouth daily.    . Coenzyme Q10 200 MG capsule Take 200 mg by mouth daily.     No current facility-administered medications on file prior to visit.     BP 116/69 (BP Location: Right Arm, Patient Position: Sitting, Cuff Size: Large)   Pulse 76   Temp 98.3 F (36.8 C) (Oral)   Resp 16   Ht '5\' 6"'$  (1.676 m)   Wt 201 lb 3.2 oz (91.3 kg)   SpO2 94%  BMI 32.47 kg/m       Objective:   Physical Exam  General  Mental Status - Alert. General Appearance - Well groomed. Not in acute distress.  Skin Rashes- No Rashes.  HEENT Head- Normal. Ear Auditory Canal - Left- wax clocking canal. Right - wax blocking canalTympanic Membrane- Left- Normal. Right- Normal.(post lavage both side 90% or more cleared. Tm normal_ Eye Sclera/Conjunctiva- Left- Normal. Right- Normal. Nose & Sinuses Nasal Mucosa- Left-  Not Boggy and Congested. Right-   Not Boggy and  Congested.Bilateral no  maxillary and no  frontal sinus pressure. Mouth & Throat Lips: Upper Lip- Normal: no dryness, cracking, pallor, cyanosis, or vesicular eruption. Lower Lip-Normal: no dryness, cracking, pallor, cyanosis or vesicular eruption. Buccal Mucosa- Bilateral- No Aphthous ulcers. Oropharynx- No Discharge or Erythema. Tonsils: Characteristics-  Bilateral- No Erythema or Congestion. Size/Enlargement- Bilateral- No enlargement. Discharge- bilateral-None.       Assessment & Plan:  Your wax blockage was cleared about 90% on both side. If you get re-accumulation of the wax would recommend debrox soaks for 3-4 days before lavage.   I refilled you medications that you needed refilled.  Follow up as regularly scheduled with pcp or as needed  Ziv Welchel, Percell Miller, PA-C

## 2017-04-29 ENCOUNTER — Other Ambulatory Visit: Payer: Self-pay | Admitting: Family Medicine

## 2017-05-03 IMAGING — DX DG CHEST 2V
2 series · 2 of 2 positions shown · non-contrast
Comparison: Chest radiograph 07/31/2004.

CLINICAL DATA: Shortness of breath with dizziness and nausea since
early this morning.

EXAM:
CHEST  2 VIEW

[x chest ap]
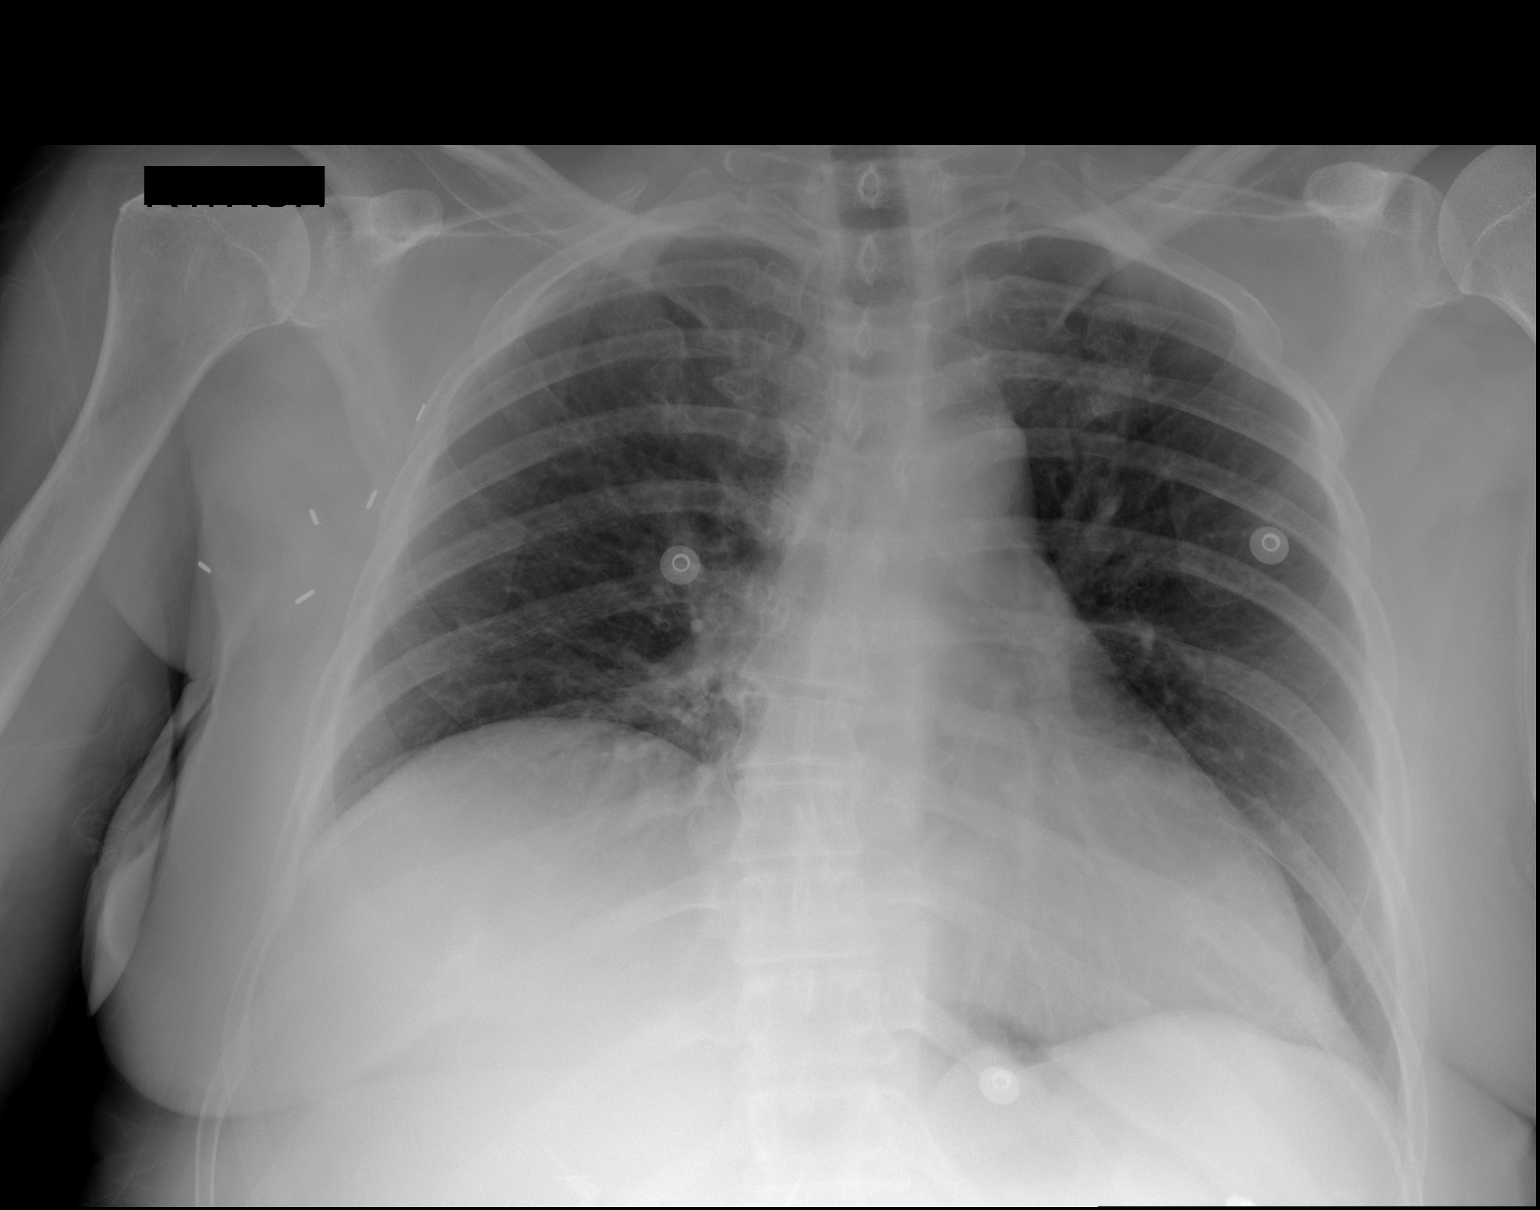

[w chest lat]
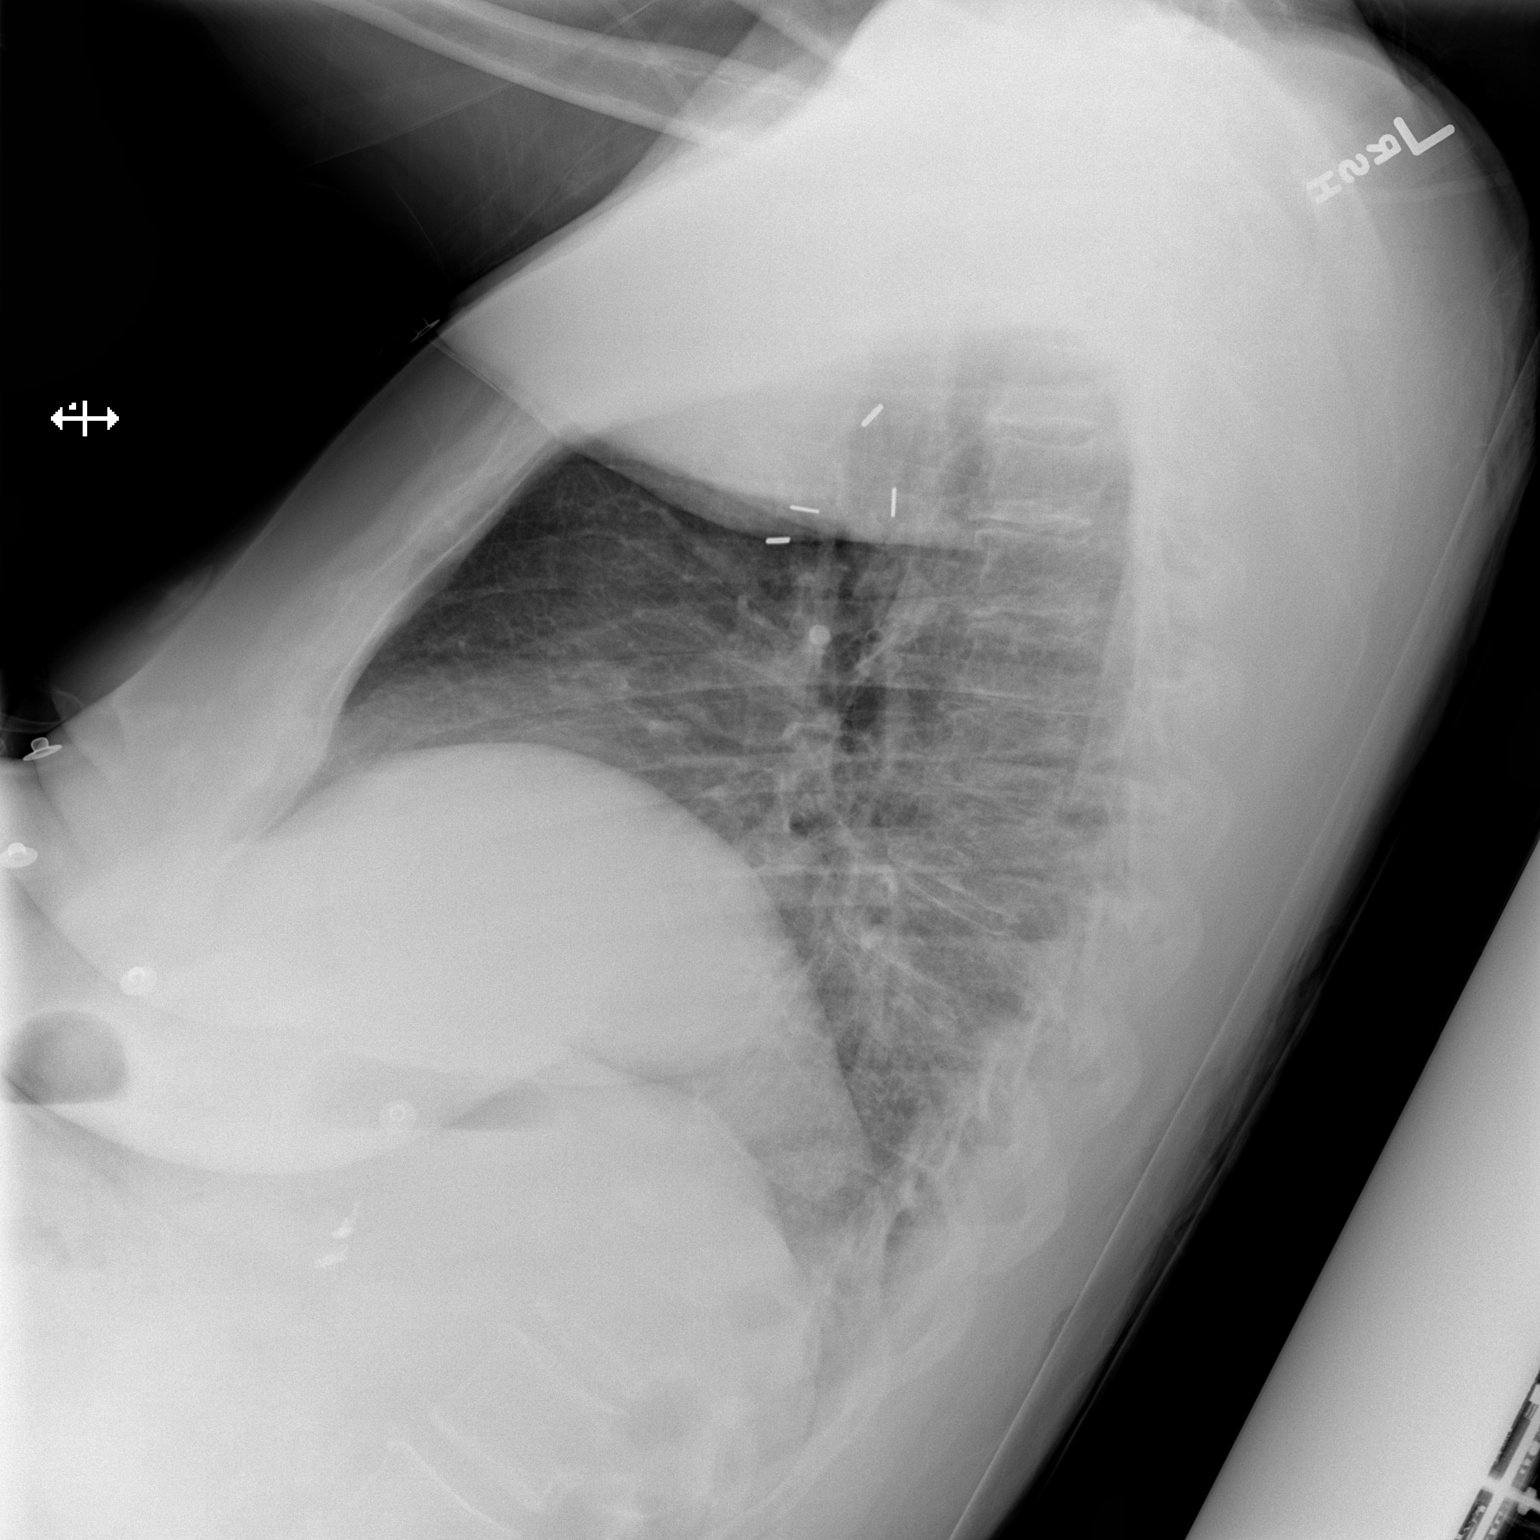

[2 of 2 positions shown; findings below may reference images not displayed]

FINDINGS: Mild cardiac enlargement. RIGHT breast surgery, possible axillary
node dissection. No effusion or pneumothorax. Scoliosis convex LEFT
upper thoracic region, convex RIGHT mid thoracic region.

Marked elevation RIGHT hemidiaphragm is new from priors. No
paratracheal lesions are observed.
IMPRESSION: Marked elevation RIGHT hemidiaphragm is new from 6995.

Mild cardiac enlargement. Postsurgical changes RIGHT breast and
axilla.

## 2017-05-16 LAB — HM MAMMOGRAPHY

## 2017-05-16 LAB — HM DEXA SCAN: HM Dexa Scan: NORMAL

## 2017-05-25 ENCOUNTER — Encounter: Payer: Self-pay | Admitting: *Deleted

## 2017-05-25 NOTE — Progress Notes (Signed)
Error

## 2017-06-10 ENCOUNTER — Encounter: Payer: Self-pay | Admitting: Family Medicine

## 2017-06-10 ENCOUNTER — Ambulatory Visit (INDEPENDENT_AMBULATORY_CARE_PROVIDER_SITE_OTHER): Payer: BC Managed Care – PPO | Admitting: Family Medicine

## 2017-06-10 VITALS — BP 118/70 | HR 69 | Temp 98.3°F | Resp 16 | Ht 65.5 in | Wt 199.8 lb

## 2017-06-10 DIAGNOSIS — I10 Essential (primary) hypertension: Secondary | ICD-10-CM

## 2017-06-10 DIAGNOSIS — Z23 Encounter for immunization: Secondary | ICD-10-CM

## 2017-06-10 DIAGNOSIS — E785 Hyperlipidemia, unspecified: Secondary | ICD-10-CM | POA: Diagnosis not present

## 2017-06-10 DIAGNOSIS — D229 Melanocytic nevi, unspecified: Secondary | ICD-10-CM | POA: Diagnosis not present

## 2017-06-10 DIAGNOSIS — Z0000 Encounter for general adult medical examination without abnormal findings: Secondary | ICD-10-CM

## 2017-06-10 LAB — POC URINALSYSI DIPSTICK (AUTOMATED)
BILIRUBIN UA: NEGATIVE
GLUCOSE UA: NEGATIVE
KETONES UA: NEGATIVE
Leukocytes, UA: NEGATIVE
NITRITE UA: NEGATIVE
PH UA: 6 (ref 5.0–8.0)
Protein, UA: NEGATIVE
RBC UA: NEGATIVE
SPEC GRAV UA: 1.02 (ref 1.010–1.025)
Urobilinogen, UA: 0.2 E.U./dL

## 2017-06-10 LAB — CBC WITH DIFFERENTIAL/PLATELET
BASOS ABS: 0 10*3/uL (ref 0.0–0.1)
BASOS PCT: 0.4 % (ref 0.0–3.0)
EOS ABS: 0.1 10*3/uL (ref 0.0–0.7)
Eosinophils Relative: 1.5 % (ref 0.0–5.0)
HCT: 43.1 % (ref 36.0–46.0)
Hemoglobin: 14.2 g/dL (ref 12.0–15.0)
LYMPHS ABS: 2.2 10*3/uL (ref 0.7–4.0)
Lymphocytes Relative: 29.3 % (ref 12.0–46.0)
MCHC: 33 g/dL (ref 30.0–36.0)
MCV: 91.8 fl (ref 78.0–100.0)
Monocytes Absolute: 0.7 10*3/uL (ref 0.1–1.0)
Monocytes Relative: 8.6 % (ref 3.0–12.0)
NEUTROS ABS: 4.6 10*3/uL (ref 1.4–7.7)
NEUTROS PCT: 60.2 % (ref 43.0–77.0)
PLATELETS: 301 10*3/uL (ref 150.0–400.0)
RBC: 4.7 Mil/uL (ref 3.87–5.11)
RDW: 14.8 % (ref 11.5–15.5)
WBC: 7.7 10*3/uL (ref 4.0–10.5)

## 2017-06-10 LAB — LIPID PANEL
CHOL/HDL RATIO: 3
Cholesterol: 138 mg/dL (ref 0–200)
HDL: 51.8 mg/dL (ref >39.00)
LDL CALC: 71 mg/dL (ref 0–99)
NONHDL: 86.5
TRIGLYCERIDES: 77 mg/dL (ref 0.0–149.0)
VLDL: 15.4 mg/dL (ref 0.0–40.0)

## 2017-06-10 LAB — COMPREHENSIVE METABOLIC PANEL
ALT: 20 U/L (ref 0–35)
AST: 22 U/L (ref 0–37)
Albumin: 4.1 g/dL (ref 3.5–5.2)
Alkaline Phosphatase: 57 U/L (ref 39–117)
BILIRUBIN TOTAL: 0.5 mg/dL (ref 0.2–1.2)
BUN: 14 mg/dL (ref 6–23)
CALCIUM: 9.6 mg/dL (ref 8.4–10.5)
CHLORIDE: 102 meq/L (ref 96–112)
CO2: 30 meq/L (ref 19–32)
CREATININE: 0.84 mg/dL (ref 0.40–1.20)
GFR: 72.38 mL/min (ref >60.00)
GLUCOSE: 99 mg/dL (ref 70–99)
Potassium: 3.4 mEq/L — ABNORMAL LOW (ref 3.5–5.1)
Sodium: 142 mEq/L (ref 135–145)
Total Protein: 7.4 g/dL (ref 6.0–8.3)

## 2017-06-10 LAB — TSH: TSH: 3.69 u[IU]/mL (ref 0.35–4.50)

## 2017-06-10 NOTE — Progress Notes (Signed)
Subjective:   I acted as a Education administrator for Dr. Carollee Herter.  Guerry Bruin, CMA   Tracy Thompson is a 66 y.o. female and is here for a comprehensive physical exam. The patient reports no problems.  Social History   Social History  . Marital status: Married    Spouse name: N/A  . Number of children: N/A  . Years of education: N/A   Occupational History  . retired Pharmacist, hospital Retired   Social History Main Topics  . Smoking status: Never Smoker  . Smokeless tobacco: Never Used  . Alcohol use No  . Drug use: No  . Sexual activity: Yes    Partners: Male   Other Topics Concern  . Not on file   Social History Narrative   Exercise--- walking   Health Maintenance  Topic Date Due  . HIV Screening  06/10/2028 (Originally 09/09/1966)  . INFLUENZA VACCINE  07/27/2017  . PAP SMEAR  04/21/2018  . MAMMOGRAM  05/16/2018  . DEXA SCAN  05/17/2019  . COLONOSCOPY  07/25/2022  . TETANUS/TDAP  04/17/2023  . Hepatitis C Screening  Completed    The following portions of the patient's history were reviewed and updated as appropriate:  She  has a past medical history of Breast cancer ([place]) (1994); Factor 5 Leiden mutation, heterozygous ([place]); Hypertension; and Menopause. She  does not have any pertinent problems on file. She  has a past surgical history that includes Breast lumpectomy (Right, 1994); varicose veins (Bilateral, 2013); and Cholecystectomy (06/20/2002). Her family history includes COPD in her brother and mother; Cancer in her father and mother; Colon cancer in her mother; Deep vein thrombosis in her father; Dementia in her mother; Diabetes in her father; Hepatitis in her mother; Hepatitis C in her brother; Hyperlipidemia in her mother; Hypertension in her father and mother; Mental illness in her brother; Prostate cancer in her father; Pulmonary embolism in her father; Schizophrenia in her brother; Stroke in her father and mother. She  reports that she has never smoked. She has never used smokeless  tobacco. She reports that she does not drink alcohol or use drugs. She has a current medication list which includes the following prescription(s): amlodipine, aspirin, atorvastatin, bisoprolol-hydrochlorothiazide, cholecalciferol, coenzyme q10, hydrochlorothiazide, and meclizine. Current Outpatient Prescriptions on File Prior to Visit  Medication Sig Dispense Refill  . amLODipine (NORVASC) 2.5 MG tablet Take 1 tablet (2.5 mg total) by mouth daily. 30 tablet 2  . aspirin 81 MG tablet Take 81 mg by mouth daily.      Marland Kitchen atorvastatin (LIPITOR) 10 MG tablet Take 1 tablet (10 mg total) by mouth daily. 30 tablet 5  . bisoprolol-hydrochlorothiazide (ZIAC) 10-6.25 MG tablet Take 1 tablet by mouth daily. 90 tablet 1  . cholecalciferol (VITAMIN D) 1000 units tablet Take 2,000 Units by mouth daily.    . Coenzyme Q10 200 MG capsule Take 200 mg by mouth daily.    . hydrochlorothiazide (HYDRODIURIL) 25 MG tablet Take 1 tablet (25 mg total) by mouth daily. 90 tablet 1  . meclizine (ANTIVERT) 12.5 MG tablet Take 1 tablet (12.5 mg total) by mouth 3 (three) times daily as needed for dizziness. 30 tablet 0   No current facility-administered medications on file prior to visit.    She has No Known Allergies..  Review of Systems Review of Systems  Constitutional: Negative for activity change, appetite change and fatigue.  HENT: Negative for hearing loss, congestion, tinnitus and ear discharge.  dentist q43m Eyes: Negative for visual disturbance (see optho q1y --  vision corrected to 20/20 with glasses).  Respiratory: Negative for cough, chest tightness and shortness of breath.   Cardiovascular: Negative for chest pain, palpitations and leg swelling.  Gastrointestinal: Negative for abdominal pain, diarrhea, constipation and abdominal distention.  Genitourinary: Negative for urgency, frequency, decreased urine volume and difficulty urinating.  Musculoskeletal: Negative for back pain, arthralgias and gait problem.   Skin: Negative for color change, pallor and rash.  Neurological: Negative for dizziness, light-headedness, numbness and headaches.  Hematological: Negative for adenopathy. Does not bruise/bleed easily.  Psychiatric/Behavioral: Negative for suicidal ideas, confusion, sleep disturbance, self-injury, dysphoric mood, decreased concentration and agitation.       Objective:    BP 118/70 (BP Location: Left Arm, Cuff Size: Large)   Pulse 69   Temp 98.3 F (36.8 C) (Oral)   Resp 16   Ht 5' 5.5" (1.664 m)   Wt 199 lb 12.8 oz (90.6 kg)   SpO2 97%   BMI 32.74 kg/m  General appearance: alert, cooperative, appears stated age and no distress Head: Normocephalic, without obvious abnormality, atraumatic Eyes: conjunctivae/corneas clear. PERRL, EOM's intact. Fundi benign. Ears: normal TM's and external ear canals both ears Nose: Nares normal. Septum midline. Mucosa normal. No drainage or sinus tenderness. Throat: lips, mucosa, and tongue normal; teeth and gums normal Neck: no adenopathy, no carotid bruit, no JVD, supple, symmetrical, trachea midline and thyroid not enlarged, symmetric, no tenderness/mass/nodules Back: symmetric, no curvature. ROM normal. No CVA tenderness. Lungs: clear to auscultation bilaterally Breasts: normal appearance, no masses or tenderness, + scars R breast from lumpectomy Heart: regular rate and rhythm, S1, S2 normal, no murmur, click, rub or gallop Abdomen: soft, non-tender; bowel sounds normal; no masses,  no organomegaly Pelvic: deferred Extremities: extremities normal, atraumatic, no cyanosis or edema Pulses: 2+ and symmetric Skin: mult moles on back-- refer to derm for skin check Lymph nodes: Cervical, supraclavicular, and axillary nodes normal. Neurologic: Alert and oriented X 3, normal strength and tone. Normal symmetric reflexes. Normal coordination and gait    Assessment:    Healthy female exam.      Plan:    ghm utd Check labs  See After Visit  Summary for Counseling Recommendations    1. Preventative health care See above - POCT Urinalysis Dipstick (Automated) - CBC with Differential/Platelet - Comprehensive metabolic panel - Lipid panel - TSH  2. Need for shingles vaccine no - Varicella-zoster vaccine IM (Shingrix)  3. Essential hypertension Well controlled, no changes to meds. Encouraged heart healthy diet such as the DASH diet and exercise as tolerated.  - Comprehensive metabolic panel - Lipid panel  4. Hyperlipidemia, unspecified hyperlipidemia type Tolerating statin, encouraged heart healthy diet, avoid trans fats, minimize simple carbs and saturated fats. Increase exercise as tolerated - Comprehensive metabolic panel - Lipid panel  5. Suspicious nevus Mult on back - Ambulatory referral to Dermatology

## 2017-06-10 NOTE — Patient Instructions (Signed)
Preventive Care 40-64 Years, Female Preventive care refers to lifestyle choices and visits with your health care provider that can promote health and wellness. What does preventive care include?  A yearly physical exam. This is also called an annual well check.  Dental exams once or twice a year.  Routine eye exams. Ask your health care provider how often you should have your eyes checked.  Personal lifestyle choices, including: ? Daily care of your teeth and gums. ? Regular physical activity. ? Eating a healthy diet. ? Avoiding tobacco and drug use. ? Limiting alcohol use. ? Practicing safe sex. ? Taking low-dose aspirin daily starting at age 58. ? Taking vitamin and mineral supplements as recommended by your health care provider. What happens during an annual well check? The services and screenings done by your health care provider during your annual well check will depend on your age, overall health, lifestyle risk factors, and family history of disease. Counseling Your health care provider may ask you questions about your:  Alcohol use.  Tobacco use.  Drug use.  Emotional well-being.  Home and relationship well-being.  Sexual activity.  Eating habits.  Work and work Statistician.  Method of birth control.  Menstrual cycle.  Pregnancy history.  Screening You may have the following tests or measurements:  Height, weight, and BMI.  Blood pressure.  Lipid and cholesterol levels. These may be checked every 5 years, or more frequently if you are over 81 years old.  Skin check.  Lung cancer screening. You may have this screening every year starting at age 78 if you have a 30-pack-year history of smoking and currently smoke or have quit within the past 15 years.  Fecal occult blood test (FOBT) of the stool. You may have this test every year starting at age 65.  Flexible sigmoidoscopy or colonoscopy. You may have a sigmoidoscopy every 5 years or a colonoscopy  every 10 years starting at age 30.  Hepatitis C blood test.  Hepatitis B blood test.  Sexually transmitted disease (STD) testing.  Diabetes screening. This is done by checking your blood sugar (glucose) after you have not eaten for a while (fasting). You may have this done every 1-3 years.  Mammogram. This may be done every 1-2 years. Talk to your health care provider about when you should start having regular mammograms. This may depend on whether you have a family history of breast cancer.  BRCA-related cancer screening. This may be done if you have a family history of breast, ovarian, tubal, or peritoneal cancers.  Pelvic exam and Pap test. This may be done every 3 years starting at age 80. Starting at age 36, this may be done every 5 years if you have a Pap test in combination with an HPV test.  Bone density scan. This is done to screen for osteoporosis. You may have this scan if you are at high risk for osteoporosis.  Discuss your test results, treatment options, and if necessary, the need for more tests with your health care provider. Vaccines Your health care provider may recommend certain vaccines, such as:  Influenza vaccine. This is recommended every year.  Tetanus, diphtheria, and acellular pertussis (Tdap, Td) vaccine. You may need a Td booster every 10 years.  Varicella vaccine. You may need this if you have not been vaccinated.  Zoster vaccine. You may need this after age 5.  Measles, mumps, and rubella (MMR) vaccine. You may need at least one dose of MMR if you were born in  1957 or later. You may also need a second dose.  Pneumococcal 13-valent conjugate (PCV13) vaccine. You may need this if you have certain conditions and were not previously vaccinated.  Pneumococcal polysaccharide (PPSV23) vaccine. You may need one or two doses if you smoke cigarettes or if you have certain conditions.  Meningococcal vaccine. You may need this if you have certain  conditions.  Hepatitis A vaccine. You may need this if you have certain conditions or if you travel or work in places where you may be exposed to hepatitis A.  Hepatitis B vaccine. You may need this if you have certain conditions or if you travel or work in places where you may be exposed to hepatitis B.  Haemophilus influenzae type b (Hib) vaccine. You may need this if you have certain conditions.  Talk to your health care provider about which screenings and vaccines you need and how often you need them. This information is not intended to replace advice given to you by your health care provider. Make sure you discuss any questions you have with your health care provider. Document Released: 01/09/2016 Document Revised: 09/01/2016 Document Reviewed: 10/14/2015 Elsevier Interactive Patient Education  2017 [employer].

## 2017-06-15 ENCOUNTER — Other Ambulatory Visit: Payer: Self-pay | Admitting: Family Medicine

## 2017-06-15 MED ORDER — POTASSIUM CHLORIDE CRYS ER 20 MEQ PO TBCR: 20.0000 meq | EXTENDED_RELEASE_TABLET | Freq: Every day | ORAL | 1 refills | Status: DC

## 2017-07-01 ENCOUNTER — Other Ambulatory Visit: Payer: Self-pay | Admitting: Medical

## 2017-07-01 ENCOUNTER — Other Ambulatory Visit: Payer: Self-pay | Admitting: Family Medicine

## 2017-07-01 DIAGNOSIS — H8110 Benign paroxysmal vertigo, unspecified ear: Secondary | ICD-10-CM

## 2017-07-09 ENCOUNTER — Other Ambulatory Visit: Payer: Self-pay | Admitting: Medical

## 2017-07-09 DIAGNOSIS — H8110 Benign paroxysmal vertigo, unspecified ear: Secondary | ICD-10-CM

## 2017-07-11 ENCOUNTER — Other Ambulatory Visit: Payer: Self-pay | Admitting: Medical

## 2017-07-11 DIAGNOSIS — H8110 Benign paroxysmal vertigo, unspecified ear: Secondary | ICD-10-CM

## 2017-07-12 ENCOUNTER — Encounter: Payer: Self-pay | Admitting: Family Medicine

## 2017-07-12 MED ORDER — MECLIZINE HCL 12.5 MG PO TABS: 12.5000 mg | ORAL_TABLET | Freq: Three times a day (TID) | ORAL | 0 refills | Status: DC | PRN

## 2017-08-03 ENCOUNTER — Other Ambulatory Visit: Payer: Self-pay | Admitting: Medical

## 2017-08-03 DIAGNOSIS — I10 Essential (primary) hypertension: Secondary | ICD-10-CM

## 2017-08-04 ENCOUNTER — Other Ambulatory Visit: Payer: Self-pay | Admitting: Medical

## 2017-08-04 DIAGNOSIS — I10 Essential (primary) hypertension: Secondary | ICD-10-CM

## 2017-08-16 ENCOUNTER — Other Ambulatory Visit: Payer: Self-pay | Admitting: Family Medicine

## 2017-08-16 NOTE — Telephone Encounter (Signed)
Sent Rx to pharmacy. LB 

## 2017-08-23 ENCOUNTER — Ambulatory Visit: Payer: BC Managed Care – PPO

## 2017-08-23 ENCOUNTER — Ambulatory Visit (INDEPENDENT_AMBULATORY_CARE_PROVIDER_SITE_OTHER): Payer: BC Managed Care – PPO | Admitting: Family Medicine

## 2017-08-23 ENCOUNTER — Encounter: Payer: Self-pay | Admitting: Family Medicine

## 2017-08-23 VITALS — BP 128/80 | HR 71 | Temp 97.8°F | Resp 14 | Ht 66.0 in | Wt 203.4 lb

## 2017-08-23 DIAGNOSIS — Z23 Encounter for immunization: Secondary | ICD-10-CM | POA: Diagnosis not present

## 2017-08-23 NOTE — Patient Instructions (Signed)
Skin Tag, Adult A skin tag (acrochordon) is a soft, extra growth of skin. Most skin tags are flesh-colored and rarely bigger than a pencil eraser. They commonly form near areas where there are folds in the skin, such as the armpit or groin. Skin tags are not dangerous, and they do not spread from person to person (are not contagious). You may have one skin tag or several. Skin tags do not require treatment. However, your health care provider may recommend removal of a skin tag if it:  Gets irritated from clothing.  Bleeds.  Is visible and unsightly.  Your health care provider can remove skin tags with a simple surgical procedure or a procedure that involves freezing the skin tag. Follow these instructions at home:  Watch for any changes in your skin tag. A normal skin tag does not require any other special care at home.  Take over-the-counter and prescription medicines only as told by your health care provider.  Keep all follow-up visits as told by your health care provider. This is important. Contact a health care provider if:  You have a skin tag that: ? Becomes painful. ? Changes color. ? Bleeds. ? Swells.  You develop more skin tags. This information is not intended to replace advice given to you by your health care provider. Make sure you discuss any questions you have with your health care provider. Document Released: 12/28/2015 Document Revised: 08/08/2016 Document Reviewed: 12/28/2015 Elsevier Interactive Patient Education  [employer].

## 2017-08-23 NOTE — Progress Notes (Signed)
Skin Tag Removal Procedure Note  Pre-operative Diagnosis: Classic skin tags (acrochordon)  Post-operative Diagnosis: Classic skin tags (acrochordon)  Locations:anterior trunk--x5  Indications: irritation  Anesthesia: Lidocaine 1% with epinephrine without added sodium bicarbonate  Procedure Details  The risks (including bleeding and infection) and benefits of the procedure and Verbal informed consent obtained. Using sterile iris scissors, multiple skin tags were snipped off at their bases after cleansing with Betadine.  Bleeding was controlled by pressure.   Findings: Pathognomonic benign lesions  not sent for pathological exam.  Condition: Stable  Complications: none.  Plan: 1. Instructed to keep the wounds dry and covered for 24-48h and clean thereafter. 2. Warning signs of infection were reviewed.   3. Recommended that the patient use OTC analgesics as needed for pain.  4. Return as needed.

## 2017-08-23 NOTE — Progress Notes (Signed)
Pre visit review using our clinic review tool, if applicable. No additional management support is needed unless otherwise documented below in the visit note. 

## 2017-10-11 ENCOUNTER — Ambulatory Visit (INDEPENDENT_AMBULATORY_CARE_PROVIDER_SITE_OTHER): Payer: Medicare Other | Admitting: Behavioral Health

## 2017-10-11 DIAGNOSIS — Z23 Encounter for immunization: Secondary | ICD-10-CM | POA: Diagnosis not present

## 2017-10-11 NOTE — Progress Notes (Signed)
Pre visit review using our clinic review tool, if applicable. No additional management support is needed unless otherwise documented below in the visit note.  Patient came in clinic for influenza vaccination. IM injection was given in the left deltoid. Patient tolerated the injection well.

## 2017-10-28 ENCOUNTER — Other Ambulatory Visit: Payer: Self-pay | Admitting: Family Medicine

## 2017-10-28 DIAGNOSIS — I10 Essential (primary) hypertension: Secondary | ICD-10-CM

## 2017-10-31 NOTE — Telephone Encounter (Signed)
°  HydroCHLOROthiazide 25 MG take 1 tablet by mouth once daily     Bisoprolol-Hydrochlorothiazide 10-6.25 MG take 1 tablet by mouth once daily     Potassium Chloride Crys ER 20 MEQ take 1 tablet by mouth once daily meclizine (ANTIVERT) 12.5 Patient checking on status of refills. Patient states she is completely out. Advised patient to call sooner before running out to ensure refills timely.

## 2017-12-30 ENCOUNTER — Other Ambulatory Visit: Payer: Self-pay | Admitting: Family Medicine

## 2018-01-27 ENCOUNTER — Other Ambulatory Visit: Payer: Self-pay | Admitting: Family Medicine

## 2018-01-27 ENCOUNTER — Other Ambulatory Visit: Payer: Self-pay | Admitting: Medical

## 2018-01-27 DIAGNOSIS — I10 Essential (primary) hypertension: Secondary | ICD-10-CM

## 2018-01-28 ENCOUNTER — Other Ambulatory Visit: Payer: Self-pay | Admitting: Family Medicine

## 2018-02-01 ENCOUNTER — Other Ambulatory Visit: Payer: Self-pay

## 2018-02-01 DIAGNOSIS — E785 Hyperlipidemia, unspecified: Secondary | ICD-10-CM

## 2018-02-01 MED ORDER — ATORVASTATIN CALCIUM 10 MG PO TABS: 10.0000 mg | ORAL_TABLET | Freq: Every day | ORAL | 5 refills | Status: DC

## 2018-03-06 ENCOUNTER — Other Ambulatory Visit: Payer: Self-pay | Admitting: Family Medicine

## 2018-04-10 ENCOUNTER — Other Ambulatory Visit: Payer: Self-pay | Admitting: Family Medicine

## 2018-04-12 MED ORDER — POTASSIUM CHLORIDE CRYS ER 20 MEQ PO TBCR: 20.0000 meq | EXTENDED_RELEASE_TABLET | Freq: Every day | ORAL | 2 refills | Status: DC

## 2018-04-24 ENCOUNTER — Other Ambulatory Visit: Payer: Self-pay | Admitting: Family Medicine

## 2018-04-24 DIAGNOSIS — I10 Essential (primary) hypertension: Secondary | ICD-10-CM

## 2018-05-12 ENCOUNTER — Other Ambulatory Visit: Payer: Self-pay | Admitting: Family Medicine

## 2018-05-24 LAB — HM MAMMOGRAPHY

## 2018-06-07 ENCOUNTER — Encounter: Payer: Self-pay | Admitting: Family Medicine

## 2018-06-12 ENCOUNTER — Ambulatory Visit (INDEPENDENT_AMBULATORY_CARE_PROVIDER_SITE_OTHER): Payer: Medicare Other | Admitting: Family Medicine

## 2018-06-12 ENCOUNTER — Encounter: Payer: Self-pay | Admitting: Family Medicine

## 2018-06-12 VITALS — BP 122/66 | HR 62 | Temp 97.9°F | Resp 18 | Ht 66.0 in | Wt 202.8 lb

## 2018-06-12 DIAGNOSIS — I10 Essential (primary) hypertension: Secondary | ICD-10-CM | POA: Diagnosis not present

## 2018-06-12 DIAGNOSIS — E785 Hyperlipidemia, unspecified: Secondary | ICD-10-CM | POA: Diagnosis not present

## 2018-06-12 DIAGNOSIS — Z23 Encounter for immunization: Secondary | ICD-10-CM | POA: Diagnosis not present

## 2018-06-12 DIAGNOSIS — Z0000 Encounter for general adult medical examination without abnormal findings: Secondary | ICD-10-CM

## 2018-06-12 MED ORDER — PNEUMOCOCCAL 13-VAL CONJ VACC IM SUSP: 0.5000 mL | INTRAMUSCULAR | Status: DC

## 2018-06-12 NOTE — Progress Notes (Addendum)
Subjective:    Tracy Thompson is a 67 y.o. female who presents for a Welcome to Medicare exam and cpe.    Review of Systems  Review of Systems  Constitutional: Negative for activity change, appetite change and fatigue.  HENT: Negative for hearing loss, congestion, tinnitus and ear discharge.   Eyes: Negative for visual disturbance (see optho q1y -- vision corrected to 20/20 with glasses).  Respiratory: Negative for cough, chest tightness and shortness of breath.   Cardiovascular: Negative for chest pain, palpitations and leg swelling.  Gastrointestinal: Negative for abdominal pain, diarrhea, constipation and abdominal distention.  Genitourinary: Negative for urgency, frequency, decreased urine volume and difficulty urinating.  Musculoskeletal: Negative for back pain, arthralgias and gait problem.  Skin: Negative for color change, pallor and rash.  Neurological: Negative for dizziness, light-headedness, numbness and headaches.  Hematological: Negative for adenopathy. Does not bruise/bleed easily.  Psychiatric/Behavioral: Negative for suicidal ideas, confusion, sleep disturbance, self-injury, dysphoric mood, decreased concentration and agitation.  Pt is able to read and write and can do all ADLs No risk for falling No abuse/ violence in home          Objective:    Today's Vitals   06/12/18 1319  BP: 122/66  Pulse: 62  Resp: 18  Temp: 97.9 F (36.6 C)  TempSrc: Oral  SpO2: 98%  Weight: 202 lb 12.8 oz (92 kg)  Height: 5\' 6"  (1.676 m)  Body mass index is 32.73 kg/m.  Medications Outpatient Encounter Medications as of 06/12/2018  Medication Sig  . amLODipine (NORVASC) 2.5 MG tablet take 1 tablet by mouth once daily  . amLODipine (NORVASC) 2.5 MG tablet take 1 tablet by mouth once daily  . amLODipine (NORVASC) 2.5 MG tablet TAKE 1 TABLET BY MOUTH ONCE DAILY  . aspirin 81 MG tablet Take 81 mg by mouth daily.    Marland Kitchen atorvastatin (LIPITOR) 10 MG tablet Take 1 tablet (10 mg  total) by mouth daily.  . bisoprolol-hydrochlorothiazide (ZIAC) 10-6.25 MG tablet Take 1 tablet by mouth daily.  . bisoprolol-hydrochlorothiazide (ZIAC) 10-6.25 MG tablet take 1 tablet by mouth once daily  . bisoprolol-hydrochlorothiazide (ZIAC) 10-6.25 MG tablet TAKE 1 TABLET BY MOUTH ONCE DAILY  . cholecalciferol (VITAMIN D) 1000 units tablet Take 2,000 Units by mouth daily.  . Coenzyme Q10 200 MG capsule Take 200 mg by mouth daily.  . hydrochlorothiazide (HYDRODIURIL) 25 MG tablet TAKE 1 TABLET BY MOUTH ONCE DAILY  . meclizine (ANTIVERT) 12.5 MG tablet Take 1 tablet (12.5 mg total) by mouth 3 (three) times daily as needed for dizziness.  . potassium chloride SA (K-DUR,KLOR-CON) 20 MEQ tablet TAKE 1 TABLET(20 MEQ) BY MOUTH DAILY   No facility-administered encounter medications on file as of 06/12/2018.      History: Past Medical History:  Diagnosis Date  . Breast cancer ([place]) 1994   s/p chemo 8/94-1/95, radiation 8/94-11/94  . 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 Hypertension   . Menopause    chemo induced   Past Surgical History:  Procedure Laterality Date  . BREAST LUMPECTOMY Right 1994   w/ radiation and chemotherapy following lumpectomy  . CHOLECYSTECTOMY  06/20/2002   Laparoscopic cholestectomy  . varicose veins Bilateral 2013   laser surgery   Dr. Jones Skene    Family History  Problem Relation Age of Onset  . Cancer Mother        Bladder CA, colon  . Colon cancer Mother   .  Stroke Mother   . Hypertension Mother   . Hyperlipidemia Mother   . Hepatitis Mother        hep A  . COPD Mother   . Dementia Mother   . Hypertension Father   . Prostate cancer Father   . Diabetes Father   . Stroke Father   . Pulmonary embolism Father   . Deep vein thrombosis Father   . Cancer Father        prostate cancer  . Schizophrenia Brother   . Mental illness Brother   . COPD Brother   . Hepatitis C Brother   . Liver disease Unknown     Social History   Occupational History  . Occupation: retired Product manager: RET[employer]  Tobacco Use  . Smoking status: Never Smoker  . Smokeless tobacco: Never Used  Substance and Sexual Activity  . Alcohol use: No  . Drug use: No  . Sexual activity: Yes    Partners: Male    Tobacco Counseling Counseling given: Not Answered   Immunizations and Health Maintenance Immunization History  Administered Date(s) Administered  . Hep A / Hep B 01/26/2011, 03/30/2011, 07/29/2011  . Influenza Split 09/20/2011, 10/17/2012  . Influenza Whole 09/26/2008, 10/08/2009, 10/19/2010  . Influenza, High Dose Seasonal PF 10/11/2017  . Influenza,inj,Quad PF,6+ Mos 10/08/2013, 10/08/2014, 10/16/2015, 10/12/2016  . Pneumococcal Polysaccharide-23 04/22/2015  . Td 07/02/2003  . Tdap 04/16/2013  . Zoster 10/31/2012  . Zoster Recombinat (Shingrix) 06/10/2017, 08/23/2017   Health Maintenance Due  Topic Date Due  . PNA vac Low Risk Adult (1 of 2 - PCV13) 09/08/2017  . PAP SMEAR  04/21/2018    Activities of Daily Living No flowsheet data found.  Physical Exam   BP 122/66 (BP Location: Left Arm, Patient Position: Sitting, Cuff Size: Large)   Pulse 62   Temp 97.9 F (36.6 C) (Oral)   Resp 18   Ht 5\' 6"  (1.676 m)   Wt 202 lb 12.8 oz (92 kg)   SpO2 98%   BMI 32.73 kg/m  General appearance: alert, cooperative, appears stated age and no distress Head: Normocephalic, without obvious abnormality, atraumatic Eyes: conjunctivae/corneas clear. PERRL, EOM's intact. Fundi benign. Ears: normal TM's and external ear canals both ears Nose: Nares normal. Septum midline. Mucosa normal. No drainage or sinus tenderness. Throat: lips, mucosa, and tongue normal; teeth and gums normal Neck: no adenopathy, no carotid bruit, no JVD, supple, symmetrical, trachea midline and thyroid not enlarged, symmetric, no tenderness/mass/nodules Back: symmetric, no curvature. ROM normal. No CVA tenderness. Lungs: clear  to auscultation bilaterally Breasts: + scarring from radiation R breast Heart: regular rate and rhythm, S1, S2 normal, no murmur, click, rub or gallop Abdomen: soft, non-tender; bowel sounds normal; no masses,  no organomegaly Pelvic: deferred-- post menopausal Extremities: extremities normal, atraumatic, no cyanosis or edema Pulses: 2+ and symmetric Skin: Skin color, texture, turgor normal. No rashes or lesions Lymph nodes: Cervical, supraclavicular, and axillary nodes normal. Neurologic: Alert and oriented X 3, normal strength and tone. Normal symmetric reflexes. Normal coordination and gait Advanced Directives: Does Patient Have a Medical Advance Directive?: Yes Type of Advance Directive: Healthcare Power of Attorney, Living will Does patient want to make changes to medical advance directive?: No - Patient declined Copy of City of [place] in Chart?: No - copy requested    Assessment:    This is a routine wellness examination for this patient .   Vision/Hearing screen Hearing Screening Comments: Whisper normal  Vision Screening  Comments: opth q2y  Dietary issues and exercise activities discussed:  Current Exercise Habits: Home exercise routine, Type of exercise: walking, Time (Minutes): 40, Frequency (Times/Week): 6, Weekly Exercise (Minutes/Week): 240, Intensity: Moderate, Exercise limited by: None identified  Goals    None     Depression Screen PHQ 2/9 Scores 06/12/2018 05/31/2016 04/16/2013  PHQ - 2 Score 0 0 0     Fall Risk Fall Risk  06/12/2018  Falls in the past year? No    Cognitive Function: MMSE - Mini Mental State Exam 06/12/2018  Orientation to time 5  Orientation to Place 5  Registration 3  Attention/ Calculation 5  Recall 3  Language- name 2 objects 2  Language- repeat 1  Language- follow 3 step command 3  Language- read & follow direction 1  Write a sentence 1  Copy design 1  Total score 30    EKG =--- NSR  No acute changes    Patient  Care Team: Carollee Herter, Alferd Apa, DO as PCP - General Elza Rafter, MD as Consulting Physician (Family Medicine)     Plan:   ghm utd Check labs  See AVS  I have personally reviewed and noted the following in the patient's chart:   . Medical and social history . Use of alcohol, tobacco or illicit drugs  . Current medications and supplements . Functional ability and status . Nutritional status . Physical activity . Advanced directives . List of other physicians . Hospitalizations, surgeries, and ER visits in previous 12 months . Vitals . Screenings to include cognitive, depression, and falls . Referrals and appointments  In addition, I have reviewed and discussed with patient certain preventive protocols, quality metrics, and best practice recommendations. A written personalized care plan for preventive services as well as general preventive health recommendations were provided to patient.    1. Hyperlipidemia LDL goal <100 Encouraged heart healthy diet, increase exercise, avoid trans fats, consider a krill oil cap daily - Comprehensive metabolic panel - Lipid panel  2. Essential hypertension Well controlled, no changes to meds. Encouraged heart healthy diet such as the DASH diet and exercise as tolerated.  - CBC with Differential/Platelet - Comprehensive metabolic panel  3. Welcome to Medicare preventive visit  - EKG 12-Lead  4. Encounter for Medicare annual wellness exam   5. Need for pneumococcal vaccination  - Pneumococcal conjugate vaccine 13-valent   [name], DO 06/12/2018

## 2018-06-12 NOTE — Patient Instructions (Signed)
Preventive Care 40-64 Years, Female Preventive care refers to lifestyle choices and visits with your health care provider that can promote health and wellness. What does preventive care include?  A yearly physical exam. This is also called an annual well check.  Dental exams once or twice a year.  Routine eye exams. Ask your health care provider how often you should have your eyes checked.  Personal lifestyle choices, including: ? Daily care of your teeth and gums. ? Regular physical activity. ? Eating a healthy diet. ? Avoiding tobacco and drug use. ? Limiting alcohol use. ? Practicing safe sex. ? Taking low-dose aspirin daily starting at age 58. ? Taking vitamin and mineral supplements as recommended by your health care provider. What happens during an annual well check? The services and screenings done by your health care provider during your annual well check will depend on your age, overall health, lifestyle risk factors, and family history of disease. Counseling Your health care provider may ask you questions about your:  Alcohol use.  Tobacco use.  Drug use.  Emotional well-being.  Home and relationship well-being.  Sexual activity.  Eating habits.  Work and work Statistician.  Method of birth control.  Menstrual cycle.  Pregnancy history.  Screening You may have the following tests or measurements:  Height, weight, and BMI.  Blood pressure.  Lipid and cholesterol levels. These may be checked every 5 years, or more frequently if you are over 81 years old.  Skin check.  Lung cancer screening. You may have this screening every year starting at age 78 if you have a 30-pack-year history of smoking and currently smoke or have quit within the past 15 years.  Fecal occult blood test (FOBT) of the stool. You may have this test every year starting at age 65.  Flexible sigmoidoscopy or colonoscopy. You may have a sigmoidoscopy every 5 years or a colonoscopy  every 10 years starting at age 30.  Hepatitis C blood test.  Hepatitis B blood test.  Sexually transmitted disease (STD) testing.  Diabetes screening. This is done by checking your blood sugar (glucose) after you have not eaten for a while (fasting). You may have this done every 1-3 years.  Mammogram. This may be done every 1-2 years. Talk to your health care provider about when you should start having regular mammograms. This may depend on whether you have a family history of breast cancer.  BRCA-related cancer screening. This may be done if you have a family history of breast, ovarian, tubal, or peritoneal cancers.  Pelvic exam and Pap test. This may be done every 3 years starting at age 80. Starting at age 36, this may be done every 5 years if you have a Pap test in combination with an HPV test.  Bone density scan. This is done to screen for osteoporosis. You may have this scan if you are at high risk for osteoporosis.  Discuss your test results, treatment options, and if necessary, the need for more tests with your health care provider. Vaccines Your health care provider may recommend certain vaccines, such as:  Influenza vaccine. This is recommended every year.  Tetanus, diphtheria, and acellular pertussis (Tdap, Td) vaccine. You may need a Td booster every 10 years.  Varicella vaccine. You may need this if you have not been vaccinated.  Zoster vaccine. You may need this after age 5.  Measles, mumps, and rubella (MMR) vaccine. You may need at least one dose of MMR if you were born in  1957 or later. You may also need a second dose.  Pneumococcal 13-valent conjugate (PCV13) vaccine. You may need this if you have certain conditions and were not previously vaccinated.  Pneumococcal polysaccharide (PPSV23) vaccine. You may need one or two doses if you smoke cigarettes or if you have certain conditions.  Meningococcal vaccine. You may need this if you have certain  conditions.  Hepatitis A vaccine. You may need this if you have certain conditions or if you travel or work in places where you may be exposed to hepatitis A.  Hepatitis B vaccine. You may need this if you have certain conditions or if you travel or work in places where you may be exposed to hepatitis B.  Haemophilus influenzae type b (Hib) vaccine. You may need this if you have certain conditions.  Talk to your health care provider about which screenings and vaccines you need and how often you need them. This information is not intended to replace advice given to you by your health care provider. Make sure you discuss any questions you have with your health care provider. Document Released: 01/09/2016 Document Revised: 09/01/2016 Document Reviewed: 10/14/2015 Elsevier Interactive Patient Education  2018 [employer]  

## 2018-06-12 NOTE — Progress Notes (Deleted)
Subjective:  I acted as a Education administrator for [employer]. Yancey Flemings, [place]   Patient ID: Tracy Thompson, female    DOB: [birth year], 67 y.o.   MRN: [id number]  Chief Complaint  Patient presents with  . Annual Exam    HPI  Patient is in today for annual exam.  Patient Care Team: Carollee Herter, Alferd Apa, DO as PCP - General Elza Rafter, MD as Consulting Physician (Family Medicine)   Past Medical History:  Diagnosis Date  . Breast cancer ([place]) 1994   s/p chemo 8/94-1/95, radiation 8/94-11/94  . Factor 5 Leiden mutation, heterozygous [facility])    also has Factor 8 problems  . Hypertension   . Menopause    chemo induced    Past Surgical History:  Procedure Laterality Date  . BREAST LUMPECTOMY Right 1994   w/ radiation and chemotherapy following lumpectomy  . CHOLECYSTECTOMY  06/20/2002   Laparoscopic cholestectomy  . varicose veins Bilateral 2013   laser surgery   Dr. Jones Skene    Family History  Problem Relation Age of Onset  . Cancer Mother        Bladder CA, colon  . Colon cancer Mother   . Stroke Mother   . Hypertension Mother   . Hyperlipidemia Mother   . Hepatitis Mother        hep A  . COPD Mother   . Dementia Mother   . Hypertension Father   . Prostate cancer Father   . Diabetes Father   . Stroke Father   . Pulmonary embolism Father   . Deep vein thrombosis Father   . Cancer Father        prostate cancer  . Schizophrenia Brother   . Mental illness Brother   . COPD Brother   . Hepatitis C Brother   . Liver disease Unknown     Social History   Socioeconomic History  . Marital status: Married    Spouse name: Not on file  . Number of children: Not on file  . Years of education: Not on file  . Highest education level: Not on file  Occupational History  . Occupation: retired Product manager: RETIRED  Social Needs  . Financial resource strain: Not on file  . Food insecurity:    Worry: Not on file    Inability: Not on file  . Transportation  needs:    Medical: Not on file    Non-medical: Not on file  Tobacco Use  . Smoking status: Never Smoker  . Smokeless tobacco: Never Used  Substance and Sexual Activity  . Alcohol use: No  . Drug use: No  . Sexual activity: Yes    Partners: Male  Lifestyle  . Physical activity:    Days per week: Not on file    Minutes per session: Not on file  . Stress: Not on file  Relationships  . Social connections:    Talks on phone: Not on file    Gets together: Not on file    Attends religious service: Not on file    Active member of club or organization: Not on file    Attends meetings of clubs or organizations: Not on file    Relationship status: Not on file  . Intimate partner violence:    Fear of current or ex partner: Not on file    Emotionally abused: Not on file    Physically abused: Not on file    Forced sexual activity: Not on file  Other  Topics Concern  . Not on file  Social History Narrative   Exercise--- walking    Outpatient Medications Prior to Visit  Medication Sig Dispense Refill  . amLODipine (NORVASC) 2.5 MG tablet take 1 tablet by mouth once daily 30 tablet 2  . amLODipine (NORVASC) 2.5 MG tablet take 1 tablet by mouth once daily 90 tablet 0  . amLODipine (NORVASC) 2.5 MG tablet TAKE 1 TABLET BY MOUTH ONCE DAILY 90 tablet 0  . aspirin 81 MG tablet Take 81 mg by mouth daily.      Marland Kitchen atorvastatin (LIPITOR) 10 MG tablet Take 1 tablet (10 mg total) by mouth daily. 30 tablet 5  . bisoprolol-hydrochlorothiazide (ZIAC) 10-6.25 MG tablet Take 1 tablet by mouth daily. 90 tablet 1  . bisoprolol-hydrochlorothiazide (ZIAC) 10-6.25 MG tablet take 1 tablet by mouth once daily 90 tablet 1  . bisoprolol-hydrochlorothiazide (ZIAC) 10-6.25 MG tablet TAKE 1 TABLET BY MOUTH ONCE DAILY 90 tablet 0  . cholecalciferol (VITAMIN D) 1000 units tablet Take 2,000 Units by mouth daily.    . Coenzyme Q10 200 MG capsule Take 200 mg by mouth daily.    . hydrochlorothiazide (HYDRODIURIL) 25 MG  tablet TAKE 1 TABLET BY MOUTH ONCE DAILY 90 tablet 0  . meclizine (ANTIVERT) 12.5 MG tablet Take 1 tablet (12.5 mg total) by mouth 3 (three) times daily as needed for dizziness. 30 tablet 0  . potassium chloride SA (K-DUR,KLOR-CON) 20 MEQ tablet TAKE 1 TABLET(20 MEQ) BY MOUTH DAILY 90 tablet 2   No facility-administered medications prior to visit.     No Known Allergies  ROS     Objective:    Physical Exam  There were no vitals taken for this visit. Wt Readings from Last 3 Encounters:  08/23/17 203 lb 6 oz (92.3 kg)  06/10/17 199 lb 12.8 oz (90.6 kg)  04/27/17 201 lb 3.2 oz (91.3 kg)   BP Readings from Last 3 Encounters:  08/23/17 128/80  06/10/17 118/70  04/27/17 116/69     Immunization History  Administered Date(s) Administered  . Hep A / Hep B 01/26/2011, 03/30/2011, 07/29/2011  . Influenza Split 09/20/2011, 10/17/2012  . Influenza Whole 09/26/2008, 10/08/2009, 10/19/2010  . Influenza, High Dose Seasonal PF 10/11/2017  . Influenza,inj,Quad PF,6+ Mos 10/08/2013, 10/08/2014, 10/16/2015, 10/12/2016  . Pneumococcal Polysaccharide-23 04/22/2015  . Td 07/02/2003  . Tdap 04/16/2013  . Zoster 10/31/2012  . Zoster Recombinat (Shingrix) 06/10/2017, 08/23/2017    Health Maintenance  Topic Date Due  . PNA vac Low Risk Adult (1 of 2 - PCV13) 09/08/2017  . PAP SMEAR  04/21/2018  . HIV Screening  06/10/2028 (Originally 09/09/1966)  . INFLUENZA VACCINE  07/27/2018  . DEXA SCAN  05/17/2019  . MAMMOGRAM  05/25/2019  . COLONOSCOPY  07/25/2022  . TETANUS/TDAP  04/17/2023  . Hepatitis C Screening  Completed    Lab Results  Component Value Date   WBC 7.7 06/10/2017   HGB 14.2 06/10/2017   HCT 43.1 06/10/2017   PLT 301.0 06/10/2017   GLUCOSE 99 06/10/2017   CHOL 138 06/10/2017   TRIG 77.0 06/10/2017   HDL 51.80 06/10/2017   LDLDIRECT 135.7 02/11/2012   LDLCALC 71 06/10/2017   ALT 20 06/10/2017   AST 22 06/10/2017   NA 142 06/10/2017   K 3.4 (L) 06/10/2017   CL 102  06/10/2017   CREATININE 0.84 06/10/2017   BUN 14 06/10/2017   CO2 30 06/10/2017   TSH 3.69 06/10/2017   HGBA1C 6.5 10/03/2013   MICROALBUR <0.7  05/31/2016    Lab Results  Component Value Date   TSH 3.69 06/10/2017   Lab Results  Component Value Date   WBC 7.7 06/10/2017   HGB 14.2 06/10/2017   HCT 43.1 06/10/2017   MCV 91.8 06/10/2017   PLT 301.0 06/10/2017   Lab Results  Component Value Date   NA 142 06/10/2017   K 3.4 (L) 06/10/2017   CO2 30 06/10/2017   GLUCOSE 99 06/10/2017   BUN 14 06/10/2017   CREATININE 0.84 06/10/2017   BILITOT 0.5 06/10/2017   ALKPHOS 57 06/10/2017   AST 22 06/10/2017   ALT 20 06/10/2017   PROT 7.4 06/10/2017   ALBUMIN 4.1 06/10/2017   CALCIUM 9.6 06/10/2017   ANIONGAP 14 05/05/2016   GFR 72.38 06/10/2017   Lab Results  Component Value Date   CHOL 138 06/10/2017   Lab Results  Component Value Date   HDL 51.80 06/10/2017   Lab Results  Component Value Date   LDLCALC 71 06/10/2017   Lab Results  Component Value Date   TRIG 77.0 06/10/2017   Lab Results  Component Value Date   CHOLHDL 3 06/10/2017   Lab Results  Component Value Date   HGBA1C 6.5 10/03/2013         Assessment & Plan:   Problem List Items Addressed This Visit    None      I am having Tracy Thompson maintain her aspirin, Coenzyme Q10, cholecalciferol, bisoprolol-hydrochlorothiazide, meclizine, amLODipine, bisoprolol-hydrochlorothiazide, amLODipine, atorvastatin, amLODipine, bisoprolol-hydrochlorothiazide, hydrochlorothiazide, and potassium chloride SA.  No orders of the defined types were placed in this encounter.   {PROVIDER TO DELETE} Bartholome Bill, RMA

## 2018-06-13 LAB — COMPREHENSIVE METABOLIC PANEL
ALT: 29 U/L (ref 0–35)
AST: 24 U/L (ref 0–37)
Albumin: 4.3 g/dL (ref 3.5–5.2)
Alkaline Phosphatase: 68 U/L (ref 39–117)
BUN: 11 mg/dL (ref 6–23)
CO2: 27 meq/L (ref 19–32)
CREATININE: 0.85 mg/dL (ref 0.40–1.20)
Calcium: 9.7 mg/dL (ref 8.4–10.5)
Chloride: 102 mEq/L (ref 96–112)
GFR: 71.17 mL/min (ref >60.00)
GLUCOSE: 90 mg/dL (ref 70–99)
Potassium: 3.8 mEq/L (ref 3.5–5.1)
Sodium: 140 mEq/L (ref 135–145)
Total Bilirubin: 0.6 mg/dL (ref 0.2–1.2)
Total Protein: 7.2 g/dL (ref 6.0–8.3)

## 2018-06-13 LAB — CBC WITH DIFFERENTIAL/PLATELET
BASOS ABS: 0.1 10*3/uL (ref 0.0–0.1)
Basophils Relative: 1.2 % (ref 0.0–3.0)
EOS ABS: 0.1 10*3/uL (ref 0.0–0.7)
Eosinophils Relative: 1.4 % (ref 0.0–5.0)
HCT: 44.2 % (ref 36.0–46.0)
Hemoglobin: 14.8 g/dL (ref 12.0–15.0)
LYMPHS ABS: 2.4 10*3/uL (ref 0.7–4.0)
Lymphocytes Relative: 31.1 % (ref 12.0–46.0)
MCHC: 33.6 g/dL (ref 30.0–36.0)
MCV: 91.4 fl (ref 78.0–100.0)
Monocytes Absolute: 0.7 10*3/uL (ref 0.1–1.0)
Monocytes Relative: 9 % (ref 3.0–12.0)
NEUTROS ABS: 4.4 10*3/uL (ref 1.4–7.7)
NEUTROS PCT: 57.3 % (ref 43.0–77.0)
PLATELETS: 333 10*3/uL (ref 150.0–400.0)
RBC: 4.84 Mil/uL (ref 3.87–5.11)
RDW: 14.9 % (ref 11.5–15.5)
WBC: 7.7 10*3/uL (ref 4.0–10.5)

## 2018-06-13 LAB — LIPID PANEL
CHOL/HDL RATIO: 2
Cholesterol: 129 mg/dL (ref 0–200)
HDL: 56.8 mg/dL (ref >39.00)
LDL Cholesterol: 57 mg/dL (ref 0–99)
NONHDL: 72.43
Triglycerides: 79 mg/dL (ref 0.0–149.0)
VLDL: 15.8 mg/dL (ref 0.0–40.0)

## 2018-06-14 ENCOUNTER — Other Ambulatory Visit: Payer: Self-pay | Admitting: *Deleted

## 2018-06-14 DIAGNOSIS — I10 Essential (primary) hypertension: Secondary | ICD-10-CM

## 2018-06-14 DIAGNOSIS — E785 Hyperlipidemia, unspecified: Secondary | ICD-10-CM

## 2018-06-26 ENCOUNTER — Encounter: Payer: Self-pay | Admitting: Medical

## 2018-06-26 ENCOUNTER — Ambulatory Visit (INDEPENDENT_AMBULATORY_CARE_PROVIDER_SITE_OTHER): Payer: Medicare Other | Admitting: Medical

## 2018-06-26 VITALS — BP 123/66 | HR 65 | Temp 98.0°F | Resp 16 | Ht 66.0 in | Wt 203.6 lb

## 2018-06-26 DIAGNOSIS — H6123 Impacted cerumen, bilateral: Secondary | ICD-10-CM | POA: Diagnosis not present

## 2018-06-26 NOTE — Progress Notes (Signed)
Subjective:    Patient ID: Tracy Thompson, female    DOB: 1951-07-23, 67 y.o.   MRN: [id number]  HPI  Pt has history of wax re-accumulation. Pt states her hearing on rt side never normalized despite partial wax removal in the past.  Pt states she likely re-accumulated wax  6 months ago.  Over last week she used debrox 2-3 times on the right side.    Review of Systems  Constitutional: Negative for chills, fatigue and fever.  HENT: Negative for congestion, ear discharge, hearing loss, mouth sores, postnasal drip and rhinorrhea.        Ears feels blocked. Decreased hearing rt side.  Respiratory: Negative for cough, chest tightness, shortness of breath and wheezing.   Cardiovascular: Negative for chest pain and palpitations.  Musculoskeletal: Negative for back pain.  Skin: Negative for rash.  Neurological: Negative for dizziness, speech difficulty and weakness.  Hematological: Negative for adenopathy. Does not bruise/bleed easily.  Psychiatric/Behavioral: Negative for behavioral problems, decreased concentration and sleep disturbance. The patient is not nervous/anxious and is not hyperactive.     Past Medical History:  Diagnosis Date  . Breast cancer ([place]) 1994   s/p chemo 8/94-1/95, radiation 8/94-11/94  . Factor 5 Leiden mutation, heterozygous [facility])    also has Factor 8 problems  . Hypertension   . Menopause    chemo induced     Social History   Socioeconomic History  . Marital status: Married    Spouse name: Not on file  . Number of children: Not on file  . Years of education: Not on file  . Highest education level: Not on file  Occupational History  . Occupation: retired Product manager: RETIRED  Social Needs  . Financial resource strain: Not on file  . Food insecurity:    Worry: Not on file    Inability: Not on file  . Transportation needs:    Medical: Not on file    Non-medical: Not on file  Tobacco Use  . Smoking status: Never Smoker  . Smokeless  tobacco: Never Used  Substance and Sexual Activity  . Alcohol use: No  . Drug use: No  . Sexual activity: Yes    Partners: Male  Lifestyle  . Physical activity:    Days per week: Not on file    Minutes per session: Not on file  . Stress: Not on file  Relationships  . Social connections:    Talks on phone: Not on file    Gets together: Not on file    Attends religious service: Not on file    Active member of club or organization: Not on file    Attends meetings of clubs or organizations: Not on file    Relationship status: Not on file  . Intimate partner violence:    Fear of current or ex partner: Not on file    Emotionally abused: Not on file    Physically abused: Not on file    Forced sexual activity: Not on file  Other Topics Concern  . Not on file  Social History Narrative   Exercise--- walking    Past Surgical History:  Procedure Laterality Date  . BREAST LUMPECTOMY Right 1994   w/ radiation and chemotherapy following lumpectomy  . CHOLECYSTECTOMY  06/20/2002   Laparoscopic cholestectomy  . varicose veins Bilateral 2013   laser surgery   Dr. Jones Skene    Family History  Problem Relation Age of Onset  . Cancer Mother  Bladder CA, colon  . Colon cancer Mother   . Stroke Mother   . Hypertension Mother   . Hyperlipidemia Mother   . Hepatitis Mother        hep A  . COPD Mother   . Dementia Mother   . Hypertension Father   . Prostate cancer Father   . Diabetes Father   . Stroke Father   . Pulmonary embolism Father   . Deep vein thrombosis Father   . Cancer Father        prostate cancer  . Schizophrenia Brother   . Mental illness Brother   . COPD Brother   . Hepatitis C Brother   . Liver disease Unknown     No Known Allergies  Current Outpatient Medications on File Prior to Visit  Medication Sig Dispense Refill  . amLODipine (NORVASC) 2.5 MG tablet take 1 tablet by mouth once daily 30 tablet 2  . amLODipine (NORVASC) 2.5 MG tablet take 1  tablet by mouth once daily 90 tablet 0  . amLODipine (NORVASC) 2.5 MG tablet TAKE 1 TABLET BY MOUTH ONCE DAILY 90 tablet 0  . aspirin 81 MG tablet Take 81 mg by mouth daily.      Marland Kitchen atorvastatin (LIPITOR) 10 MG tablet Take 1 tablet (10 mg total) by mouth daily. 30 tablet 5  . bisoprolol-hydrochlorothiazide (ZIAC) 10-6.25 MG tablet Take 1 tablet by mouth daily. 90 tablet 1  . bisoprolol-hydrochlorothiazide (ZIAC) 10-6.25 MG tablet take 1 tablet by mouth once daily 90 tablet 1  . bisoprolol-hydrochlorothiazide (ZIAC) 10-6.25 MG tablet TAKE 1 TABLET BY MOUTH ONCE DAILY 90 tablet 0  . cholecalciferol (VITAMIN D) 1000 units tablet Take 2,000 Units by mouth daily.    . Coenzyme Q10 200 MG capsule Take 200 mg by mouth daily.    . hydrochlorothiazide (HYDRODIURIL) 25 MG tablet TAKE 1 TABLET BY MOUTH ONCE DAILY 90 tablet 0  . meclizine (ANTIVERT) 12.5 MG tablet Take 1 tablet (12.5 mg total) by mouth 3 (three) times daily as needed for dizziness. 30 tablet 0  . potassium chloride SA (K-DUR,KLOR-CON) 20 MEQ tablet TAKE 1 TABLET(20 MEQ) BY MOUTH DAILY 90 tablet 2   No current facility-administered medications on file prior to visit.     BP 123/66   Pulse 65   Temp 98 F (36.7 C) (Oral)   Resp 16   Ht 5\' 6"  (1.676 m)   Wt 203 lb 9.6 oz (92.4 kg)   SpO2 99%   BMI 32.86 kg/m       Objective:   Physical Exam  General  Mental Status - Alert. General Appearance - Well groomed. Not in acute distress.  Skin Rashes- No Rashes.  HEENT Head- Normal. Ear Auditory Canal - Left- wax obstuction. Right - wax obstruction. Worse rt side(but cleared by lavage. Used currette to remove large residual portion on rt side.Tympanic Membrane- Left- Normal. Right- Normal.(post wax removal) Eye Sclera/Conjunctiva- Left- Normal. Right- Normal. Nose & Sinuses Nasal Mucosa- Left-  Not boggy and Congested. Right-  Not boggy and  Congested.Bilateral no maxillary and no frontal sinus pressure.  Mouth & Throat Lips:  Upper Lip- Normal: no dryness, cracking, pallor, cyanosis, or vesicular eruption. Lower Lip-Normal: no dryness, cracking, pallor, cyanosis or vesicular eruption. Buccal Mucosa- Bilateral- No Aphthous ulcers. Oropharynx- No Discharge or Erythema. Tonsils: Characteristics- Bilateral- No Erythema or Congestion. Size/Enlargement- Bilateral- No enlargement. Discharge- bilateral-None.  Neck Neck- Supple. No Masses.   Chest and Lung Exam Auscultation: Breath Sounds:-Clear even and unlabored.  Cardiovascular Auscultation:Rythm- Regular, rate and rhythm. Murmurs & Other Heart Sounds:Ausculatation of the heart reveal- No Murmurs.  Lymphatic Head & Neck General Head & Neck Lymphatics: Bilateral: Description- No Localized lymphadenopathy.      Assessment & Plan:  The wax was removed from both canals successfully.  Right side hearing normalized post wax removal.  Based on your history, I would recommend that if you start to get decreased hearing or blocked sensation to either side then use Debrox for 4 to 5 days and come in to recheck canal and get likely lavage again.  Follow-up as scheduled with PCP or with [place] as needed.  Mackie Pai, PA-C

## 2018-06-26 NOTE — Patient Instructions (Addendum)
The wax was removed from both canals successfully.  Right side hearing normalized post wax removal.  Based on your history, I would recommend that if you start to get decreased hearing or blocked sensation to either side then use Debrox for 4 to 5 days and come in to recheck canal and get likely lavage again.  Follow-up as scheduled with PCP or with [place] as needed.

## 2018-07-06 ENCOUNTER — Telehealth: Payer: Self-pay | Admitting: *Deleted

## 2018-07-06 NOTE — Telephone Encounter (Signed)
Received copies of patient's Healthcare POA and Living Will from 2013; forwarded to provider for review & initials, will have Admin scan in when returned/SLS 07/11

## 2018-07-12 ENCOUNTER — Other Ambulatory Visit: Payer: Self-pay | Admitting: Family Medicine

## 2018-07-12 NOTE — Progress Notes (Signed)
Done

## 2018-07-22 ENCOUNTER — Other Ambulatory Visit: Payer: Self-pay | Admitting: Family Medicine

## 2018-07-22 DIAGNOSIS — H8110 Benign paroxysmal vertigo, unspecified ear: Secondary | ICD-10-CM

## 2018-07-22 DIAGNOSIS — I10 Essential (primary) hypertension: Secondary | ICD-10-CM

## 2018-07-24 ENCOUNTER — Other Ambulatory Visit: Payer: Self-pay

## 2018-07-24 DIAGNOSIS — H8110 Benign paroxysmal vertigo, unspecified ear: Secondary | ICD-10-CM

## 2018-07-24 DIAGNOSIS — I10 Essential (primary) hypertension: Secondary | ICD-10-CM

## 2018-07-24 MED ORDER — MECLIZINE HCL 12.5 MG PO TABS: 12.5000 mg | ORAL_TABLET | Freq: Three times a day (TID) | ORAL | 0 refills | Status: DC | PRN

## 2018-07-24 MED ORDER — BISOPROLOL-HYDROCHLOROTHIAZIDE 10-6.25 MG PO TABS: 1.0000 | ORAL_TABLET | Freq: Every day | ORAL | 0 refills | Status: DC

## 2018-07-24 MED ORDER — AMLODIPINE BESYLATE 2.5 MG PO TABS: 2.5000 mg | ORAL_TABLET | Freq: Every day | ORAL | 0 refills | Status: DC

## 2018-07-24 MED ORDER — POTASSIUM CHLORIDE CRYS ER 20 MEQ PO TBCR: EXTENDED_RELEASE_TABLET | ORAL | 0 refills | Status: DC

## 2018-07-24 MED ORDER — HYDROCHLOROTHIAZIDE 25 MG PO TABS: 25.0000 mg | ORAL_TABLET | Freq: Every day | ORAL | 0 refills | Status: DC

## 2018-07-25 ENCOUNTER — Telehealth: Payer: Self-pay

## 2018-07-25 NOTE — Telephone Encounter (Signed)
PA approved.  Request Reference Number: [id number]. MECLIZINE TAB 12.5MG  is approved through 12/26/2018. For further questions, call [phone].

## 2018-07-25 NOTE — Telephone Encounter (Signed)
PA initiated via Covermymeds; KEY: [id number]. Awaiting determination.

## 2018-08-01 ENCOUNTER — Other Ambulatory Visit: Payer: Self-pay | Admitting: Family Medicine

## 2018-08-01 DIAGNOSIS — E785 Hyperlipidemia, unspecified: Secondary | ICD-10-CM

## 2018-10-16 ENCOUNTER — Ambulatory Visit (INDEPENDENT_AMBULATORY_CARE_PROVIDER_SITE_OTHER): Payer: Medicare Other

## 2018-10-16 DIAGNOSIS — Z23 Encounter for immunization: Secondary | ICD-10-CM

## 2018-10-21 ENCOUNTER — Other Ambulatory Visit: Payer: Self-pay | Admitting: Family Medicine

## 2018-10-21 DIAGNOSIS — E785 Hyperlipidemia, unspecified: Secondary | ICD-10-CM

## 2018-10-21 DIAGNOSIS — I10 Essential (primary) hypertension: Secondary | ICD-10-CM

## 2018-10-26 ENCOUNTER — Other Ambulatory Visit: Payer: Self-pay | Admitting: Family Medicine

## 2018-12-11 ENCOUNTER — Other Ambulatory Visit: Payer: Self-pay | Admitting: Emergency Medicine

## 2018-12-11 ENCOUNTER — Other Ambulatory Visit (INDEPENDENT_AMBULATORY_CARE_PROVIDER_SITE_OTHER): Payer: Medicare Other

## 2018-12-11 DIAGNOSIS — I10 Essential (primary) hypertension: Secondary | ICD-10-CM

## 2018-12-11 DIAGNOSIS — E785 Hyperlipidemia, unspecified: Secondary | ICD-10-CM

## 2018-12-11 LAB — COMPREHENSIVE METABOLIC PANEL
ALBUMIN: 4.1 g/dL (ref 3.5–5.2)
ALT: 23 U/L (ref 0–35)
AST: 17 U/L (ref 0–37)
Alkaline Phosphatase: 69 U/L (ref 39–117)
BUN: 10 mg/dL (ref 6–23)
CALCIUM: 9.3 mg/dL (ref 8.4–10.5)
CO2: 29 mEq/L (ref 19–32)
CREATININE: 0.81 mg/dL (ref 0.40–1.20)
Chloride: 102 mEq/L (ref 96–112)
GFR: 75.13 mL/min (ref >60.00)
Glucose, Bld: 110 mg/dL — ABNORMAL HIGH (ref 70–99)
Potassium: 3.7 mEq/L (ref 3.5–5.1)
SODIUM: 140 meq/L (ref 135–145)
Total Bilirubin: 0.5 mg/dL (ref 0.2–1.2)
Total Protein: 6.9 g/dL (ref 6.0–8.3)

## 2018-12-11 LAB — LIPID PANEL
Cholesterol: 126 mg/dL (ref 0–200)
HDL: 52.6 mg/dL (ref >39.00)
LDL Cholesterol: 56 mg/dL (ref 0–99)
NonHDL: 73.21
TRIGLYCERIDES: 85 mg/dL (ref 0.0–149.0)
Total CHOL/HDL Ratio: 2
VLDL: 17 mg/dL (ref 0.0–40.0)

## 2018-12-11 NOTE — Progress Notes (Signed)
lipi

## 2018-12-19 NOTE — Addendum Note (Signed)
Addended by: Magdalene Molly A on: 12/19/2018 10:57 AM   Modules accepted: Orders

## 2019-03-05 ENCOUNTER — Encounter: Payer: Self-pay | Admitting: *Deleted

## 2019-03-13 ENCOUNTER — Other Ambulatory Visit: Payer: Self-pay

## 2019-03-13 ENCOUNTER — Other Ambulatory Visit (INDEPENDENT_AMBULATORY_CARE_PROVIDER_SITE_OTHER): Payer: Medicare Other

## 2019-03-13 DIAGNOSIS — E785 Hyperlipidemia, unspecified: Secondary | ICD-10-CM | POA: Diagnosis not present

## 2019-03-13 DIAGNOSIS — I10 Essential (primary) hypertension: Secondary | ICD-10-CM

## 2019-03-13 LAB — LIPID PANEL
Cholesterol: 131 mg/dL (ref 0–200)
HDL: 52.6 mg/dL (ref >39.00)
LDL Cholesterol: 62 mg/dL (ref 0–99)
NonHDL: 78.81
Total CHOL/HDL Ratio: 2
Triglycerides: 83 mg/dL (ref 0.0–149.0)
VLDL: 16.6 mg/dL (ref 0.0–40.0)

## 2019-03-13 LAB — COMPREHENSIVE METABOLIC PANEL
ALK PHOS: 74 U/L (ref 39–117)
ALT: 26 U/L (ref 0–35)
AST: 21 U/L (ref 0–37)
Albumin: 4.3 g/dL (ref 3.5–5.2)
BUN: 12 mg/dL (ref 6–23)
CO2: 32 mEq/L (ref 19–32)
Calcium: 9.3 mg/dL (ref 8.4–10.5)
Chloride: 101 mEq/L (ref 96–112)
Creatinine, Ser: 0.84 mg/dL (ref 0.40–1.20)
GFR: 67.73 mL/min (ref >60.00)
GLUCOSE: 96 mg/dL (ref 70–99)
Potassium: 3.9 mEq/L (ref 3.5–5.1)
SODIUM: 140 meq/L (ref 135–145)
Total Bilirubin: 0.6 mg/dL (ref 0.2–1.2)
Total Protein: 7 g/dL (ref 6.0–8.3)

## 2019-03-13 LAB — HEMOGLOBIN A1C: Hgb A1c MFr Bld: 6.4 % (ref 4.6–6.5)

## 2019-04-19 ENCOUNTER — Other Ambulatory Visit: Payer: Self-pay | Admitting: Family Medicine

## 2019-04-19 DIAGNOSIS — I10 Essential (primary) hypertension: Secondary | ICD-10-CM

## 2019-04-22 ENCOUNTER — Other Ambulatory Visit: Payer: Self-pay | Admitting: Family Medicine

## 2019-04-22 DIAGNOSIS — E785 Hyperlipidemia, unspecified: Secondary | ICD-10-CM

## 2019-04-23 ENCOUNTER — Other Ambulatory Visit: Payer: Self-pay | Admitting: *Deleted

## 2019-04-23 MED ORDER — AMLODIPINE BESYLATE 2.5 MG PO TABS: ORAL_TABLET | ORAL | 0 refills | Status: DC

## 2019-04-26 ENCOUNTER — Other Ambulatory Visit: Payer: Self-pay | Admitting: Family Medicine

## 2019-05-02 ENCOUNTER — Other Ambulatory Visit: Payer: Self-pay | Admitting: *Deleted

## 2019-05-02 MED ORDER — POTASSIUM CHLORIDE CRYS ER 20 MEQ PO TBCR: 20.0000 meq | EXTENDED_RELEASE_TABLET | Freq: Every day | ORAL | 0 refills | Status: DC

## 2019-05-24 ENCOUNTER — Other Ambulatory Visit: Payer: Self-pay | Admitting: *Deleted

## 2019-05-24 DIAGNOSIS — I10 Essential (primary) hypertension: Secondary | ICD-10-CM

## 2019-05-24 MED ORDER — BISOPROLOL-HYDROCHLOROTHIAZIDE 10-6.25 MG PO TABS: 1.0000 | ORAL_TABLET | Freq: Every day | ORAL | 1 refills | Status: DC

## 2019-05-24 MED ORDER — HYDROCHLOROTHIAZIDE 25 MG PO TABS: ORAL_TABLET | ORAL | 1 refills | Status: DC

## 2019-05-30 LAB — HM MAMMOGRAPHY

## 2019-06-19 ENCOUNTER — Other Ambulatory Visit: Payer: Self-pay

## 2019-06-19 ENCOUNTER — Ambulatory Visit (INDEPENDENT_AMBULATORY_CARE_PROVIDER_SITE_OTHER): Payer: Medicare Other | Admitting: Family Medicine

## 2019-06-19 ENCOUNTER — Encounter: Payer: Self-pay | Admitting: Family Medicine

## 2019-06-19 VITALS — BP 122/80 | HR 71 | Temp 98.2°F | Resp 16 | Ht 66.0 in | Wt 201.0 lb

## 2019-06-19 DIAGNOSIS — I10 Essential (primary) hypertension: Secondary | ICD-10-CM

## 2019-06-19 DIAGNOSIS — E785 Hyperlipidemia, unspecified: Secondary | ICD-10-CM | POA: Diagnosis not present

## 2019-06-19 DIAGNOSIS — R739 Hyperglycemia, unspecified: Secondary | ICD-10-CM

## 2019-06-19 DIAGNOSIS — Z0000 Encounter for general adult medical examination without abnormal findings: Secondary | ICD-10-CM

## 2019-06-19 DIAGNOSIS — H8110 Benign paroxysmal vertigo, unspecified ear: Secondary | ICD-10-CM

## 2019-06-19 LAB — COMPREHENSIVE METABOLIC PANEL
ALT: 25 U/L (ref 0–35)
AST: 24 U/L (ref 0–37)
Albumin: 4.2 g/dL (ref 3.5–5.2)
Alkaline Phosphatase: 71 U/L (ref 39–117)
BUN: 14 mg/dL (ref 6–23)
CO2: 28 mEq/L (ref 19–32)
Calcium: 9.1 mg/dL (ref 8.4–10.5)
Chloride: 98 mEq/L (ref 96–112)
Creatinine, Ser: 0.84 mg/dL (ref 0.40–1.20)
GFR: 67.67 mL/min (ref >60.00)
Glucose, Bld: 84 mg/dL (ref 70–99)
Potassium: 3.5 mEq/L (ref 3.5–5.1)
Sodium: 137 mEq/L (ref 135–145)
Total Bilirubin: 0.7 mg/dL (ref 0.2–1.2)
Total Protein: 6.7 g/dL (ref 6.0–8.3)

## 2019-06-19 LAB — LIPID PANEL
Cholesterol: 111 mg/dL (ref 0–200)
HDL: 47.1 mg/dL (ref >39.00)
LDL Cholesterol: 50 mg/dL (ref 0–99)
NonHDL: 63.73
Total CHOL/HDL Ratio: 2
Triglycerides: 67 mg/dL (ref 0.0–149.0)
VLDL: 13.4 mg/dL (ref 0.0–40.0)

## 2019-06-19 LAB — HEMOGLOBIN A1C: Hgb A1c MFr Bld: 6.4 % (ref 4.6–6.5)

## 2019-06-19 MED ORDER — MECLIZINE HCL 12.5 MG PO TABS: 12.5000 mg | ORAL_TABLET | Freq: Three times a day (TID) | ORAL | 0 refills | Status: DC | PRN

## 2019-06-19 NOTE — Assessment & Plan Note (Signed)
Well controlled, no changes to meds. Encouraged heart healthy diet such as the DASH diet and exercise as tolerated.  °

## 2019-06-19 NOTE — Assessment & Plan Note (Signed)
Tolerating statin, encouraged heart healthy diet, avoid trans fats, minimize simple carbs and saturated fats. Increase exercise as tolerated 

## 2019-06-19 NOTE — Progress Notes (Signed)
Subjective:     Tracy Thompson is a 68 y.o. female and is here for a comprehensive physical exam. The patient reports no problems.  Social History   Socioeconomic History  . Marital status: Married    Spouse name: Not on file  . Number of children: Not on file  . Years of education: Not on file  . Highest education level: Not on file  Occupational History  . Occupation: retired Product manager: RETIRED  Social Needs  . Financial resource strain: Not on file  . Food insecurity    Worry: Not on file    Inability: Not on file  . Transportation needs    Medical: Not on file    Non-medical: Not on file  Tobacco Use  . Smoking status: Never Smoker  . Smokeless tobacco: Never Used  Substance and Sexual Activity  . Alcohol use: No  . Drug use: No  . Sexual activity: Yes    Partners: Male  Lifestyle  . Physical activity    Days per week: Not on file    Minutes per session: Not on file  . Stress: Not on file  Relationships  . Social Herbalist on phone: Not on file    Gets together: Not on file    Attends religious service: Not on file    Active member of club or organization: Not on file    Attends meetings of clubs or organizations: Not on file    Relationship status: Not on file  . Intimate partner violence    Fear of current or ex partner: Not on file    Emotionally abused: Not on file    Physically abused: Not on file    Forced sexual activity: Not on file  Other Topics Concern  . Not on file  Social History Narrative   Exercise--- walking   Health Maintenance  Topic Date Due  . DEXA SCAN  05/17/2019  . MAMMOGRAM  05/25/2019  . INFLUENZA VACCINE  07/28/2019  . PNA vac Low Risk Adult (2 of 2 - PPSV23) 04/21/2020  . COLONOSCOPY  07/25/2022  . TETANUS/TDAP  04/17/2023  . Hepatitis C Screening  Completed    The following portions of the patient's history were reviewed and updated as appropriate: She  has a past medical history of Breast cancer  ([place]) (1994), Factor 5 Leiden mutation, heterozygous ([place]), Hypertension, and Menopause. She does not have any pertinent problems on file. She  has a past surgical history that includes Breast lumpectomy (Right, 1994); varicose veins (Bilateral, 2013); and Cholecystectomy (06/20/2002). Her family history includes COPD in her brother and mother; Cancer in her father and mother; Colon cancer in her mother; Deep vein thrombosis in her father; Dementia in her mother; Diabetes in her father; Hepatitis in her mother; Hepatitis C in her brother; Hyperlipidemia in her mother; Hypertension in her father and mother; Liver disease in her unknown relative; Mental illness in her brother; Prostate cancer in her father; Pulmonary embolism in her father; Schizophrenia in her brother; Stroke in her father and mother. She  reports that she has never smoked. She has never used smokeless tobacco. She reports that she does not drink alcohol or use drugs. She has a current medication list which includes the following prescription(s): amlodipine, aspirin, atorvastatin, bisoprolol-hydrochlorothiazide, cholecalciferol, coenzyme q10, hydrochlorothiazide, meclizine, and potassium chloride sa. Current Outpatient Medications on File Prior to Visit  Medication Sig Dispense Refill  . amLODipine (NORVASC) 2.5 MG tablet TAKE  1 TABLET(2.5 MG) BY MOUTH DAILY 90 tablet 0  . aspirin 81 MG tablet Take 81 mg by mouth daily.      Marland Kitchen atorvastatin (LIPITOR) 10 MG tablet TAKE 1 TABLET BY MOUTH EVERY DAY 90 tablet 0  . bisoprolol-hydrochlorothiazide (ZIAC) 10-6.25 MG tablet Take 1 tablet by mouth daily. 90 tablet 1  . cholecalciferol (VITAMIN D) 1000 units tablet Take 2,000 Units by mouth daily.    . Coenzyme Q10 200 MG capsule Take 200 mg by mouth daily.    . hydrochlorothiazide (HYDRODIURIL) 25 MG tablet TAKE 1 TABLET(25 MG) BY MOUTH DAILY 90 tablet 1  . potassium chloride SA (K-DUR) 20 MEQ tablet Take 1 tablet (20 mEq total) by mouth daily. 90  tablet 0   No current facility-administered medications on file prior to visit.    She has No Known Allergies..  Review of Systems Review of Systems  Constitutional: Negative for activity change, appetite change and fatigue.  HENT: Negative for hearing loss, congestion, tinnitus and ear discharge.  dentist q89m Eyes: Negative for visual disturbance (see optho q1y -- vision corrected to 20/20 with glasses).  Respiratory: Negative for cough, chest tightness and shortness of breath.   Cardiovascular: Negative for chest pain, palpitations and leg swelling.  Gastrointestinal: Negative for abdominal pain, diarrhea, constipation and abdominal distention.  Genitourinary: Negative for urgency, frequency, decreased urine volume and difficulty urinating.  Musculoskeletal: Negative for back pain, arthralgias and gait problem.  Skin: Negative for color change, pallor and rash.  Neurological: Negative for dizziness, light-headedness, numbness and headaches.  Hematological: Negative for adenopathy. Does not bruise/bleed easily.  Psychiatric/Behavioral: Negative for suicidal ideas, confusion, sleep disturbance, self-injury, dysphoric mood, decreased concentration and agitation.       Objective:    BP 122/80 (BP Location: Left Arm, Patient Position: Sitting, Cuff Size: Large)   Pulse 71   Temp 98.2 F (36.8 C) (Oral)   Resp 16   Ht 5\' 6"  (1.676 m)   Wt 201 lb (91.2 kg)   SpO2 99%   BMI 32.44 kg/m  General appearance: alert, cooperative, appears stated age and no distress Head: Normocephalic, without obvious abnormality, atraumatic Eyes: negative findings: lids and lashes normal, conjunctivae and sclerae normal and pupils equal, round, reactive to light and accomodation Ears: normal TM's and external ear canals both ears Nose: Nares normal. Septum midline. Mucosa normal. No drainage or sinus tenderness. Throat: lips, mucosa, and tongue normal; teeth and gums normal Neck: no adenopathy, no  carotid bruit, no JVD, supple, symmetrical, trachea midline and thyroid not enlarged, symmetric, no tenderness/mass/nodules Back: symmetric, no curvature. ROM normal. No CVA tenderness. Lungs: clear to auscultation bilaterally Breasts: normal appearance, no masses or tenderness Heart: regular rate and rhythm, S1, S2 normal, no murmur, click, rub or gallop Abdomen: soft, non-tender; bowel sounds normal; no masses,  no organomegaly Pelvic: not indicated; post-menopausal, no abnormal Pap smears in past Extremities: extremities normal, atraumatic, no cyanosis or edema Pulses: 2+ and symmetric Skin: Skin color, texture, turgor normal. No rashes or lesions Lymph nodes: Cervical, supraclavicular, and axillary nodes normal. Neurologic: Alert and oriented X 3, normal strength and tone. Normal symmetric reflexes. Normal coordination and gait    Assessment:    Healthy female exam.      Plan:    ghm utd Check labs  See After Visit Summary for Counseling Recommendations    1. Essential hypertension Well controlled, no changes to meds. Encouraged heart healthy diet such as the DASH diet and exercise as tolerated.   -  Comprehensive metabolic panel  2. Hyperlipidemia, unspecified hyperlipidemia type Tolerating statin, encouraged heart healthy diet, avoid trans fats, minimize simple carbs and saturated fats. Increase exercise as tolerated - Lipid panel - Comprehensive metabolic panel  3. Hyperglycemia Check labs  - Hemoglobin A1c  4. Benign paroxysmal positional vertigo, unspecified laterality stable - meclizine (ANTIVERT) 12.5 MG tablet; Take 1 tablet (12.5 mg total) by mouth 3 (three) times daily as needed for dizziness.  Dispense: 30 tablet; Refill: 0

## 2019-06-28 NOTE — Progress Notes (Signed)
Virtual Visit via Video Note  I connected with patient on 07/02/19 at 10:00 AM EDT by audio enabled telemedicine application and verified that I am speaking with the correct person using two identifiers.   THIS ENCOUNTER IS A VIRTUAL VISIT DUE TO COVID-19 - PATIENT WAS NOT SEEN IN THE OFFICE. PATIENT HAS CONSENTED TO VIRTUAL VISIT / TELEMEDICINE VISIT   Location of patient: home  Location of provider: office  I discussed the limitations of evaluation and management by telemedicine and the availability of in person appointments. The patient expressed understanding and agreed to proceed.   Subjective:   Tracy Thompson is a 68 y.o. female who presents for Medicare Annual (Subsequent) preventive examination.  Review of Systems: No ROS.  Medicare Wellness Virtual Visit.  Visual/audio telehealth visit, UTA vital signs.   See social history for additional risk factors. Cardiac Risk Factors include: advanced age (>90men, >47 women);dyslipidemia;hypertension Sleep patterns: no issues Home Safety/Smoke Alarms: Feels safe in home. Smoke alarms in place.  Lives with husband in 1 story home.  Female:   Pap- 2016. No longer doing routine screening due to age.    Mammo-  05/30/19      Dexa scan-pt states she will do next year.        CCS- 07/25/12     Objective:     Vitals: BP 102/69 Comment: pt reports    Advanced Directives 07/02/2019 03/05/2019 06/12/2018 05/05/2016  Does Patient Have a Medical Advance Directive? Yes Yes Yes Yes  Type of Paramedic of [place];Living will [place];Living will [place];Living will [place];Living will  Does patient want to make changes to medical advance directive? No - Patient declined No - Patient declined No - Patient declined -  Copy of [place] in Chart? 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 Jones Skene   Family History  Problem Relation Age of Onset   Cancer Mother        Bladder CA, colon   Colon cancer Mother    Stroke Mother    Hypertension Mother    Hyperlipidemia Mother    Hepatitis Mother        hep A   COPD Mother    Dementia Mother    Hypertension Father    Prostate cancer Father    Diabetes Father    Stroke Father    Pulmonary embolism Father    Deep vein thrombosis Father  Cancer Father        prostate cancer   Schizophrenia Brother    Mental illness Brother    COPD Brother    Hepatitis C Brother    Liver disease Other    Social History   Socioeconomic History   Marital status: Married    Spouse name: Not on file   Number of children: Not on file   Years of education: Not on file   Highest education level: Not on file  Occupational History   Occupation: retired Product manager: [employer] resource strain: Not on file   Food insecurity    Worry: Not on file    Inability: Not on file   Transportation needs    Medical: Not on file    Non-medical: Not on file  Tobacco Use   Smoking status: Never Smoker     Smokeless tobacco: Never Used  Substance and Sexual Activity   Alcohol use: No   Drug use: No   Sexual activity: Yes    Partners: Male  Lifestyle   Physical activity    Days per week: Not on file    Minutes per session: Not on file   Stress: Not on file  Relationships   Social connections    Talks on phone: Not on file    Gets together: Not on file    Attends religious service: Not on file    Active member of club or organization: Not on file    Attends meetings of clubs or organizations: Not on file    Relationship status: Not on file  Other Topics Concern   Not on file  Social History Narrative   Exercise--- walking    Outpatient Encounter Medications as of 07/02/2019  Medication Sig   amLODipine (NORVASC) 2.5 MG tablet TAKE 1 TABLET(2.5 MG) BY MOUTH DAILY   aspirin 81 MG tablet Take 81 mg by mouth daily.     atorvastatin (LIPITOR) 10 MG tablet TAKE 1 TABLET BY MOUTH EVERY DAY   bisoprolol-hydrochlorothiazide (ZIAC) 10-6.25 MG tablet Take 1 tablet by mouth daily.   cholecalciferol (VITAMIN D) 1000 units tablet Take 2,000 Units by mouth daily.   Coenzyme Q10 200 MG capsule Take 200 mg by mouth daily.   hydrochlorothiazide (HYDRODIURIL) 25 MG tablet TAKE 1 TABLET(25 MG) BY MOUTH DAILY   meclizine (ANTIVERT) 12.5 MG tablet Take 1 tablet (12.5 mg total) by mouth 3 (three) times daily as needed for dizziness.   potassium chloride SA (K-DUR) 20 MEQ tablet Take 1 tablet (20 mEq total) by mouth daily.   No facility-administered encounter medications on file as of 07/02/2019.     Activities of Daily Living In your present state of health, do you have any difficulty performing the following activities: 07/02/2019  Hearing? N  Vision? N  Difficulty concentrating or making decisions? N  Walking or climbing stairs? N  Dressing or bathing? N  Doing errands, shopping? N  Preparing Food and eating ? N  Using the Toilet? N  In the past six months, have you accidently  leaked urine? N  Do you have problems with loss of bowel control? N  Managing your Medications? N  Managing your Finances? N  Housekeeping or managing your Housekeeping? N  Some recent data might be hidden    Patient Care Team: Carollee Herter, Alferd Apa, DO as PCP - General Elza Rafter, MD as Consulting Physician (Family Medicine)    Assessment:   This  is a routine wellness examination for [place]. Physical assessment deferred to PCP.  Exercise Activities and Dietary recommendations Current Exercise Habits: Home exercise routine, Type of exercise: walking, Time (Minutes): 40, Frequency (Times/Week): 5, Weekly Exercise (Minutes/Week): 200, Intensity: Moderate, Exercise limited by: None identified Diet (meal preparation, eat out, water intake, caffeinated beverages, dairy products, fruits and vegetables): in general, a "healthy" diet  , well balanced, on average, 3 meals per day    Goals     Maintain healthy active lifestyle.       Fall Risk Fall Risk  07/02/2019 06/12/2018  Falls in the past year? 0 No    Depression Screen PHQ 2/9 Scores 07/02/2019 06/12/2018 05/31/2016 04/16/2013  PHQ - 2 Score 0 0 0 0     Cognitive Function Ad8 score reviewed for issues:  Issues making decisions:no  Less interest in hobbies / activities:no  Repeats questions, stories (family complaining):no  Trouble using ordinary gadgets (microwave, computer, phone):no  Forgets the month or year: no  Mismanaging finances: no  Remembering appts:no  Daily problems with thinking and/or memory: no Ad8 score is=0     MMSE - Mini Mental State Exam 06/12/2018  Orientation to time 5  Orientation to Place 5  Registration 3  Attention/ Calculation 5  Recall 3  Language- name 2 objects 2  Language- repeat 1  Language- follow 3 step command 3  Language- read & follow direction 1  Write a sentence 1  Copy design 1  Total score 30        Immunization History  Administered Date(s) Administered    Hep A / Hep B 01/26/2011, 03/30/2011, 07/29/2011   Influenza Split 09/20/2011, 10/17/2012   Influenza Whole 09/26/2008, 10/08/2009, 10/19/2010   Influenza, High Dose Seasonal PF 10/11/2017, 10/16/2018   Influenza,inj,Quad PF,6+ Mos 10/08/2013, 10/08/2014, 10/16/2015, 10/12/2016   Pneumococcal Conjugate-13 06/12/2018   Pneumococcal Polysaccharide-23 04/22/2015   Td 07/02/2003   Tdap 04/16/2013   Zoster 10/31/2012   Zoster Recombinat (Shingrix) 06/10/2017, 08/23/2017    Screening Tests Health Maintenance  Topic Date Due   DEXA SCAN  05/17/2019   MAMMOGRAM  05/25/2019   INFLUENZA VACCINE  07/28/2019   PNA vac Low Risk Adult (2 of 2 - PPSV23) 04/21/2020   COLONOSCOPY  07/25/2022   TETANUS/TDAP  04/17/2023   Hepatitis C Screening  Completed     Plan:   See you next year!  Continue to eat heart healthy diet (full of fruits, vegetables, whole grains, lean protein, water--limit salt, fat, and sugar intake) and increase physical activity as tolerated.  Continue doing brain stimulating activities (puzzles, reading, adult coloring books, staying active) to keep memory sharp.   I have personally reviewed and noted the following in the patients chart:    Medical and social history  Use of alcohol, tobacco or illicit drugs   Current medications and supplements  Functional ability and status  Nutritional status  Physical activity  Advanced directives  List of other physicians  Hospitalizations, surgeries, and ER visits in previous 12 months  Vitals  Screenings to include cognitive, depression, and falls  Referrals and appointments  In addition, I have reviewed and discussed with patient certain preventive protocols, quality metrics, and best practice recommendations. A written personalized care plan for preventive services as well as general preventive health recommendations were provided to patient.     Shela Nevin, [place]  07/02/2019

## 2019-07-02 ENCOUNTER — Other Ambulatory Visit: Payer: Self-pay

## 2019-07-02 ENCOUNTER — Encounter: Payer: Self-pay | Admitting: *Deleted

## 2019-07-02 ENCOUNTER — Ambulatory Visit (INDEPENDENT_AMBULATORY_CARE_PROVIDER_SITE_OTHER): Payer: Medicare Other | Admitting: *Deleted

## 2019-07-02 VITALS — BP 102/69

## 2019-07-02 DIAGNOSIS — Z0000 Encounter for general adult medical examination without abnormal findings: Secondary | ICD-10-CM | POA: Diagnosis not present

## 2019-07-02 NOTE — Addendum Note (Signed)
Addended by: Naaman Plummer A on: 07/02/2019 02:22 PM   Modules accepted: Level of Service

## 2019-07-02 NOTE — Patient Instructions (Signed)
See you next year!  Continue to eat heart healthy diet (full of fruits, vegetables, whole grains, lean protein, water--limit salt, fat, and sugar intake) and increase physical activity as tolerated.  Continue doing brain stimulating activities (puzzles, reading, adult coloring books, staying active) to keep memory sharp.    Tracy Thompson , Thank you for taking time to come for your Medicare Wellness Visit. I appreciate your ongoing commitment to your health goals. Please review the following plan we discussed and let me know if I can assist you in the future.   These are the goals we discussed: Goals    . Maintain healthy active lifestyle.       This is a list of the screening recommended for you and due dates:  Health Maintenance  Topic Date Due  . DEXA scan (bone density measurement)  05/17/2019  . Mammogram  05/25/2019  . Flu Shot  07/28/2019  . Pneumonia vaccines (2 of 2 - PPSV23) 04/21/2020  . Colon Cancer Screening  07/25/2022  . Tetanus Vaccine  04/17/2023  .  Hepatitis C: One time screening is recommended by Center for Disease Control  (CDC) for  adults born from [birth year] through 1965.   Completed    Health Maintenance After Age 43 After age 5, you are at a higher risk for certain long-term diseases and infections as well as injuries from falls. Falls are a major cause of broken bones and head injuries in people who are older than age 36. Getting regular preventive care can help to keep you healthy and well. Preventive care includes getting regular testing and making lifestyle changes as recommended by your health care provider. Talk with your health care provider about:  Which screenings and tests you should have. A screening is a test that checks for a disease when you have no symptoms.  A diet and exercise plan that is right for you. What should I know about screenings and tests to prevent falls? Screening and testing are the best ways to find a health problem early. Early  diagnosis and treatment give you the best chance of managing medical conditions that are common after age 88. Certain conditions and lifestyle choices may make you more likely to have a fall. Your health care provider may recommend:  Regular vision checks. Poor vision and conditions such as cataracts can make you more likely to have a fall. If you wear glasses, make sure to get your prescription updated if your vision changes.  Medicine review. Work with your health care provider to regularly review all of the medicines you are taking, including over-the-counter medicines. Ask your health care provider about any side effects that may make you more likely to have a fall. Tell your health care provider if any medicines that you take make you feel dizzy or sleepy.  Osteoporosis screening. Osteoporosis is a condition that causes the bones to get weaker. This can make the bones weak and cause them to break more easily.  Blood pressure screening. Blood pressure changes and medicines to control blood pressure can make you feel dizzy.  Strength and balance checks. Your health care provider may recommend certain tests to check your strength and balance while standing, walking, or changing positions.  Foot health exam. Foot pain and numbness, as well as not wearing proper footwear, can make you more likely to have a fall.  Depression screening. You may be more likely to have a fall if you have a fear of falling, feel emotionally low, or  feel unable to do activities that you used to do.  Alcohol use screening. Using too much alcohol can affect your balance and may make you more likely to have a fall. What actions can I take to lower my risk of falls? General instructions  Talk with your health care provider about your risks for falling. Tell your health care provider if: ? You fall. Be sure to tell your health care provider about all falls, even ones that seem minor. ? You feel dizzy, sleepy, or  off-balance.  Take over-the-counter and prescription medicines only as told by your health care provider. These include any supplements.  Eat a healthy diet and maintain a healthy weight. A healthy diet includes low-fat dairy products, low-fat (lean) meats, and fiber from whole grains, beans, and lots of fruits and vegetables. Home safety  Remove any tripping hazards, such as rugs, cords, and clutter.  Install safety equipment such as grab bars in bathrooms and safety rails on stairs.  Keep rooms and walkways well-lit. Activity   Follow a regular exercise program to stay fit. This will help you maintain your balance. Ask your health care provider what types of exercise are appropriate for you.  If you need a cane or walker, use it as recommended by your health care provider.  Wear supportive shoes that have nonskid soles. Lifestyle  Do not drink alcohol if your health care provider tells you not to drink.  If you drink alcohol, limit how much you have: ? 0-1 drink a day for women. ? 0-2 drinks a day for men.  Be aware of how much alcohol is in your drink. In the [place] one drink equals one typical bottle of beer (12 oz), one-half glass of wine (5 oz), or one shot of hard liquor (1 oz).  Do not use any products that contain nicotine or tobacco, such as cigarettes and e-cigarettes. If you need help quitting, ask your health care provider. Summary  Having a healthy lifestyle and getting preventive care can help to protect your health and wellness after age 59.  Screening and testing are the best way to find a health problem early and help you avoid having a fall. Early diagnosis and treatment give you the best chance for managing medical conditions that are more common for people who are older than age 36.  Falls are a major cause of broken bones and head injuries in people who are older than age 30. Take precautions to prevent a fall at home.  Work with your health care provider  to learn what changes you can make to improve your health and wellness and to prevent falls. This information is not intended to replace advice given to you by your health care provider. Make sure you discuss any questions you have with your health care provider. Document Released: 10/26/2017 Document Revised: 04/05/2019 Document Reviewed: 10/26/2017 Elsevier Patient Education  2020 [employer].

## 2019-07-20 ENCOUNTER — Encounter: Payer: Self-pay | Admitting: Family Medicine

## 2019-07-30 ENCOUNTER — Other Ambulatory Visit: Payer: Self-pay | Admitting: *Deleted

## 2019-07-30 DIAGNOSIS — E785 Hyperlipidemia, unspecified: Secondary | ICD-10-CM

## 2019-07-30 MED ORDER — ATORVASTATIN CALCIUM 10 MG PO TABS: 10.0000 mg | ORAL_TABLET | Freq: Every day | ORAL | 1 refills | Status: DC

## 2019-08-17 ENCOUNTER — Other Ambulatory Visit: Payer: Self-pay | Admitting: *Deleted

## 2019-08-17 MED ORDER — AMLODIPINE BESYLATE 2.5 MG PO TABS: ORAL_TABLET | ORAL | 1 refills | Status: DC

## 2019-09-11 ENCOUNTER — Ambulatory Visit (INDEPENDENT_AMBULATORY_CARE_PROVIDER_SITE_OTHER): Payer: Medicare Other

## 2019-09-11 ENCOUNTER — Other Ambulatory Visit: Payer: Self-pay

## 2019-09-11 DIAGNOSIS — Z23 Encounter for immunization: Secondary | ICD-10-CM | POA: Diagnosis not present

## 2019-11-13 ENCOUNTER — Other Ambulatory Visit: Payer: Self-pay | Admitting: *Deleted

## 2019-11-13 DIAGNOSIS — I10 Essential (primary) hypertension: Secondary | ICD-10-CM

## 2019-11-13 MED ORDER — HYDROCHLOROTHIAZIDE 25 MG PO TABS: ORAL_TABLET | ORAL | 0 refills | Status: DC

## 2019-11-13 MED ORDER — BISOPROLOL-HYDROCHLOROTHIAZIDE 10-6.25 MG PO TABS: 1.0000 | ORAL_TABLET | Freq: Every day | ORAL | 0 refills | Status: DC

## 2019-11-13 MED ORDER — POTASSIUM CHLORIDE CRYS ER 20 MEQ PO TBCR: 20.0000 meq | EXTENDED_RELEASE_TABLET | Freq: Every day | ORAL | 0 refills | Status: DC

## 2019-12-24 ENCOUNTER — Other Ambulatory Visit (INDEPENDENT_AMBULATORY_CARE_PROVIDER_SITE_OTHER): Payer: Medicare Other

## 2019-12-24 ENCOUNTER — Other Ambulatory Visit: Payer: Self-pay

## 2019-12-24 DIAGNOSIS — I10 Essential (primary) hypertension: Secondary | ICD-10-CM

## 2019-12-24 DIAGNOSIS — E785 Hyperlipidemia, unspecified: Secondary | ICD-10-CM | POA: Diagnosis not present

## 2019-12-24 DIAGNOSIS — R739 Hyperglycemia, unspecified: Secondary | ICD-10-CM | POA: Diagnosis not present

## 2019-12-24 LAB — COMPREHENSIVE METABOLIC PANEL
ALT: 29 U/L (ref 0–35)
AST: 27 U/L (ref 0–37)
Albumin: 4.2 g/dL (ref 3.5–5.2)
Alkaline Phosphatase: 66 U/L (ref 39–117)
BUN: 12 mg/dL (ref 6–23)
CO2: 28 mEq/L (ref 19–32)
Calcium: 9.4 mg/dL (ref 8.4–10.5)
Chloride: 102 mEq/L (ref 96–112)
Creatinine, Ser: 0.94 mg/dL (ref 0.40–1.20)
GFR: 59.34 mL/min — ABNORMAL LOW (ref >60.00)
Glucose, Bld: 111 mg/dL — ABNORMAL HIGH (ref 70–99)
Potassium: 3.9 mEq/L (ref 3.5–5.1)
Sodium: 139 mEq/L (ref 135–145)
Total Bilirubin: 0.5 mg/dL (ref 0.2–1.2)
Total Protein: 7 g/dL (ref 6.0–8.3)

## 2019-12-24 LAB — LIPID PANEL
Cholesterol: 129 mg/dL (ref 0–200)
HDL: 48.2 mg/dL (ref >39.00)
LDL Cholesterol: 64 mg/dL (ref 0–99)
NonHDL: 80.77
Total CHOL/HDL Ratio: 3
Triglycerides: 84 mg/dL (ref 0.0–149.0)
VLDL: 16.8 mg/dL (ref 0.0–40.0)

## 2019-12-24 LAB — HEMOGLOBIN A1C: Hgb A1c MFr Bld: 6.4 % (ref 4.6–6.5)

## 2019-12-31 ENCOUNTER — Other Ambulatory Visit: Payer: Medicare Other

## 2020-02-07 ENCOUNTER — Other Ambulatory Visit: Payer: Self-pay | Admitting: Family Medicine

## 2020-02-07 DIAGNOSIS — I10 Essential (primary) hypertension: Secondary | ICD-10-CM

## 2020-02-09 ENCOUNTER — Ambulatory Visit: Payer: Medicare PPO | Attending: Internal Medicine

## 2020-02-09 DIAGNOSIS — Z23 Encounter for immunization: Secondary | ICD-10-CM

## 2020-02-09 NOTE — Progress Notes (Signed)
   Covid-19 Vaccination Clinic  Name:  Tracy Thompson    MRN: [id number] DOB: [birth year]  02/09/2020  Tracy Thompson was observed post Covid-19 immunization for 15 minutes without incidence. She was provided with Vaccine Information Sheet and instruction to access the V-Safe system.   Tracy Thompson was instructed to call 911 with any severe reactions post vaccine: Marland Kitchen Difficulty breathing  . Swelling of your face and throat  . A fast heartbeat  . A bad rash all over your body  . Dizziness and weakness    Immunizations Administered    Name Date Dose VIS Date Route   Pfizer COVID-19 Vaccine 02/09/2020  2:30 PM 0.3 mL 12/07/2019 Intramuscular   Manufacturer: [place]   Lot: [id number]   [place]: [id number]

## 2020-02-11 ENCOUNTER — Other Ambulatory Visit: Payer: Self-pay | Admitting: *Deleted

## 2020-02-11 DIAGNOSIS — I10 Essential (primary) hypertension: Secondary | ICD-10-CM

## 2020-02-11 MED ORDER — POTASSIUM CHLORIDE CRYS ER 20 MEQ PO TBCR: 20.0000 meq | EXTENDED_RELEASE_TABLET | Freq: Every day | ORAL | 0 refills | Status: DC

## 2020-02-11 MED ORDER — AMLODIPINE BESYLATE 2.5 MG PO TABS: ORAL_TABLET | ORAL | 0 refills | Status: DC

## 2020-02-11 MED ORDER — BISOPROLOL-HYDROCHLOROTHIAZIDE 10-6.25 MG PO TABS: 1.0000 | ORAL_TABLET | Freq: Every day | ORAL | 0 refills | Status: DC

## 2020-02-11 MED ORDER — HYDROCHLOROTHIAZIDE 25 MG PO TABS: ORAL_TABLET | ORAL | 0 refills | Status: DC

## 2020-02-18 ENCOUNTER — Ambulatory Visit: Payer: Medicare PPO

## 2020-03-03 ENCOUNTER — Ambulatory Visit: Payer: Medicare PPO | Attending: Internal Medicine

## 2020-03-03 DIAGNOSIS — Z23 Encounter for immunization: Secondary | ICD-10-CM | POA: Insufficient documentation

## 2020-03-03 NOTE — Progress Notes (Signed)
   Covid-19 Vaccination Clinic  Name:  Tracy Thompson    MRN: [id number] DOB: 12-Mar-1951  03/03/2020  Tracy Thompson was observed post Covid-19 immunization for 15 minutes without incident. She was provided with Vaccine Information Sheet and instruction to access the V-Safe system.   Tracy Thompson was instructed to call 911 with any severe reactions post vaccine: Marland Kitchen Difficulty breathing  . Swelling of face and throat  . A fast heartbeat  . A bad rash all over body  . Dizziness and weakness   Immunizations Administered    Name Date Dose VIS Date Route   Pfizer COVID-19 Vaccine 03/03/2020  1:20 PM 0.3 mL 12/07/2019 Intramuscular   Manufacturer: [place]   Lot: [id number]   [place]: [id number]

## 2020-04-29 DIAGNOSIS — H10413 Chronic giant papillary conjunctivitis, bilateral: Secondary | ICD-10-CM | POA: Diagnosis not present

## 2020-04-29 DIAGNOSIS — H04123 Dry eye syndrome of bilateral lacrimal glands: Secondary | ICD-10-CM | POA: Diagnosis not present

## 2020-04-29 DIAGNOSIS — H2513 Age-related nuclear cataract, bilateral: Secondary | ICD-10-CM | POA: Diagnosis not present

## 2020-04-29 DIAGNOSIS — H524 Presbyopia: Secondary | ICD-10-CM | POA: Diagnosis not present

## 2020-05-07 ENCOUNTER — Other Ambulatory Visit: Payer: Self-pay | Admitting: *Deleted

## 2020-05-07 DIAGNOSIS — E785 Hyperlipidemia, unspecified: Secondary | ICD-10-CM

## 2020-05-07 DIAGNOSIS — I10 Essential (primary) hypertension: Secondary | ICD-10-CM

## 2020-05-07 MED ORDER — ATORVASTATIN CALCIUM 10 MG PO TABS: 10.0000 mg | ORAL_TABLET | Freq: Every day | ORAL | 0 refills | Status: DC

## 2020-05-07 MED ORDER — BISOPROLOL-HYDROCHLOROTHIAZIDE 10-6.25 MG PO TABS: 1.0000 | ORAL_TABLET | Freq: Every day | ORAL | 0 refills | Status: DC

## 2020-05-07 MED ORDER — AMLODIPINE BESYLATE 2.5 MG PO TABS: ORAL_TABLET | ORAL | 0 refills | Status: DC

## 2020-05-07 MED ORDER — HYDROCHLOROTHIAZIDE 25 MG PO TABS: ORAL_TABLET | ORAL | 0 refills | Status: DC

## 2020-05-07 MED ORDER — POTASSIUM CHLORIDE CRYS ER 20 MEQ PO TBCR: 20.0000 meq | EXTENDED_RELEASE_TABLET | Freq: Every day | ORAL | 0 refills | Status: DC

## 2020-05-07 NOTE — Addendum Note (Signed)
Addended by: Kem Boroughs D on: 05/07/2020 01:46 PM   Modules accepted: Orders

## 2020-06-04 ENCOUNTER — Encounter: Payer: Self-pay | Admitting: Family Medicine

## 2020-06-04 DIAGNOSIS — Z1231 Encounter for screening mammogram for malignant neoplasm of breast: Secondary | ICD-10-CM

## 2020-06-04 DIAGNOSIS — Z780 Asymptomatic menopausal state: Secondary | ICD-10-CM

## 2020-06-11 DIAGNOSIS — Z0389 Encounter for observation for other suspected diseases and conditions ruled out: Secondary | ICD-10-CM | POA: Diagnosis not present

## 2020-06-11 DIAGNOSIS — Z1231 Encounter for screening mammogram for malignant neoplasm of breast: Secondary | ICD-10-CM | POA: Diagnosis not present

## 2020-06-11 LAB — HM MAMMOGRAPHY

## 2020-06-17 ENCOUNTER — Telehealth: Payer: Self-pay

## 2020-06-17 NOTE — Telephone Encounter (Signed)
Pt called. Left VM with Bone Density results.

## 2020-07-04 NOTE — Progress Notes (Signed)
I connected with Tracy Thompson today by telephone and verified that I am speaking with the correct person using two identifiers. Location patient: home Location provider: work Persons participating in the virtual visit: patient, Marine scientist.    I discussed the limitations, risks, security and privacy concerns of performing an evaluation and management service by telephone and the availability of in person appointments. I also discussed with the patient that there may be a patient responsible charge related to this service. The patient expressed understanding and verbally consented to this telephonic visit.    Interactive audio and video telecommunications were attempted between this provider and patient, however failed, due to patient having technical difficulties OR patient did not have access to video capability.  We continued and completed visit with audio only.  Some vital signs may be absent or patient reported.    Subjective:   Tracy Thompson PHILLIPS is a 69 y.o. female who presents for Medicare Annual (Subsequent) preventive examination.  Review of Systems     Cardiac Risk Factors include: advanced age (>69men, >52 women);dyslipidemia;hypertension     Objective:     Advanced Directives 07/07/2020 07/02/2019 03/05/2019 06/12/2018 05/05/2016  Does Patient Have a Medical Advance Directive? Yes Yes Yes Yes Yes  Type of Paramedic of [place];Living will [place];Living will [place];Living will [place];Living will [place];Living will  Does patient want to make changes to medical advance directive? No - Patient declined No - Patient declined No - Patient declined No - Patient declined -  Copy of [place] in Chart? Yes - validated most recent copy scanned in chart (See row information) Yes - validated most recent copy scanned in chart (See row information) Yes - validated most recent copy  scanned in chart (See row information) No - copy requested No - copy requested    Current Medications (verified) Outpatient Encounter Medications as of 07/07/2020  Medication Sig  . amLODipine (NORVASC) 2.5 MG tablet TAKE 1 TABLET(2.5 MG) BY MOUTH DAILY  . aspirin 81 MG tablet Take 81 mg by mouth daily.    Marland Kitchen atorvastatin (LIPITOR) 10 MG tablet Take 1 tablet (10 mg total) by mouth daily.  . bisoprolol-hydrochlorothiazide (ZIAC) 10-6.25 MG tablet Take 1 tablet by mouth daily.  . cholecalciferol (VITAMIN D) 1000 units tablet Take 2,000 Units by mouth daily.  . Coenzyme Q10 200 MG capsule Take 200 mg by mouth daily.  . hydrochlorothiazide (HYDRODIURIL) 25 MG tablet TAKE 1 TABLET(25 MG) BY MOUTH DAILY  . potassium chloride SA (KLOR-CON) 20 MEQ tablet Take 1 tablet (20 mEq total) by mouth daily.  . meclizine (ANTIVERT) 12.5 MG tablet Take 1 tablet (12.5 mg total) by mouth 3 (three) times daily as needed for dizziness. (Patient not taking: Reported on 07/07/2020)   No facility-administered encounter medications on file as of 07/07/2020.    Allergies (verified) Patient has no known allergies.   History: Past Medical History:  Diagnosis Date  . Breast cancer ([place]) 1994   s/p chemo 8/94-1/95, radiation 8/94-11/94  . Factor 5 Leiden mutation, heterozygous [facility])    also has Factor 8 problems  . Hypertension   . Menopause    chemo induced   Past Surgical History:  Procedure Laterality Date  . BREAST LUMPECTOMY Right 1994   w/ radiation and chemotherapy following lumpectomy  . CHOLECYSTECTOMY  06/20/2002   Laparoscopic cholestectomy  . varicose veins Bilateral 2013   laser surgery   Dr. Jones Skene  Family History  Problem Relation Age of Onset  . Cancer Mother        Bladder CA, colon  . Colon cancer Mother   . Stroke Mother   . Hypertension Mother   . Hyperlipidemia Mother   . Hepatitis Mother        hep A  . COPD Mother   . Dementia Mother   . Hypertension Father   . Prostate  cancer Father   . Diabetes Father   . Stroke Father   . Pulmonary embolism Father   . Deep vein thrombosis Father   . Cancer Father        prostate cancer  . Schizophrenia Brother   . Mental illness Brother   . COPD Brother   . Hepatitis C Brother   . Liver disease Other    Social History   Socioeconomic History  . Marital status: Married    Spouse name: Not on file  . Number of children: Not on file  . Years of education: Not on file  . Highest education level: Not on file  Occupational History  . Occupation: retired Product manager: RETIRED  Tobacco Use  . Smoking status: Never Smoker  . Smokeless tobacco: Never Used  Substance and Sexual Activity  . Alcohol use: No  . Drug use: No  . Sexual activity: Yes    Partners: Male  Other Topics Concern  . Not on file  Social History Narrative   Exercise--- walking   Social Determinants of Health   Financial Resource Strain: Low Risk   . Difficulty of Paying Living Expenses: Not hard at all  Food Insecurity: No Food Insecurity  . Worried About Charity fundraiser in the Last Year: Never true  . Ran Out of Food in the Last Year: Never true  Transportation Needs: No Transportation Needs  . Lack of Transportation (Medical): No  . Lack of Transportation (Non-Medical): No  Physical Activity:   . Days of Exercise per Week:   . Minutes of Exercise per Session:   Stress:   . Feeling of Stress :   Social Connections:   . Frequency of Communication with Friends and Family:   . Frequency of Social Gatherings with Friends and Family:   . Attends Religious Services:   . Active Member of Clubs or Organizations:   . Attends Archivist Meetings:   Marland Kitchen Marital Status:     Tobacco Counseling Counseling given: Not Answered   Clinical Intake: Pain : No/denies pain    Activities of Daily Living In your present state of health, do you have any difficulty performing the following activities: 07/07/2020  Hearing? N    Vision? N  Difficulty concentrating or making decisions? N  Walking or climbing stairs? N  Dressing or bathing? N  Doing errands, shopping? N  Preparing Food and eating ? N  Using the Toilet? N  In the past six months, have you accidently leaked urine? N  Do you have problems with loss of bowel control? N  Managing your Medications? N  Managing your Finances? N  Housekeeping or managing your Housekeeping? N  Some recent data might be hidden    Patient Care Team: Carollee Herter, Alferd Apa, DO as PCP - General Elza Rafter, MD as Consulting Physician (Family Medicine)  Indicate any recent Medical Services you may have received from other than [facility] providers in the past year (date may be approximate).     Assessment:  This is a routine wellness examination for Tracy Thompson.  Dietary issues and exercise activities discussed: Current Exercise Habits: Home exercise routine, Type of exercise: walking, Time (Minutes): 40, Frequency (Times/Week): 6, Weekly Exercise (Minutes/Week): 240, Intensity: Mild, Exercise limited by: None identified Diet (meal preparation, eat out, water intake, caffeinated beverages, dairy products, fruits and vegetables): in general, a "healthy" diet  , well balanced   Goals    . Maintain healthy active lifestyle.      Depression Screen PHQ 2/9 Scores 07/07/2020 07/02/2019 06/12/2018 05/31/2016 04/16/2013  PHQ - 2 Score 0 0 0 0 0    Fall Risk Fall Risk  07/07/2020 07/02/2019 06/12/2018  Falls in the past year? 0 0 No  Number falls in past yr: 0 - -  Injury with Fall? 0 - -  Follow up Education provided;Falls prevention discussed - -   Lives w/ husband in 1 story home.  Any stairs in or around the home? No  If so, are there any without handrails? No  Home free of loose throw rugs in walkways, pet beds, electrical cords, etc? Yes  Adequate lighting in your home to reduce risk of falls? Yes    Cognitive Function: Ad8 score reviewed for issues:  Issues making  decisions:no  Less interest in hobbies / activities:no  Repeats questions, stories (family complaining):no  Trouble using ordinary gadgets (microwave, computer, phone):no  Forgets the month or year: no  Mismanaging finances: no  Remembering appts:no  Daily problems with thinking and/or memory:no Ad8 score is=0    MMSE - Mini Mental State Exam 06/12/2018  Orientation to time 5  Orientation to Place 5  Registration 3  Attention/ Calculation 5  Recall 3  Language- name 2 objects 2  Language- repeat 1  Language- follow 3 step command 3  Language- read & follow direction 1  Write a sentence 1  Copy design 1  Total score 30        Immunizations Immunization History  Administered Date(s) Administered  . Fluad Quad(high Dose 65+) 09/11/2019  . Hep A / Hep B 01/26/2011, 03/30/2011, 07/29/2011  . Influenza Split 09/20/2011, 10/17/2012  . Influenza Whole 09/26/2008, 10/08/2009, 10/19/2010  . Influenza, High Dose Seasonal PF 10/11/2017, 10/16/2018  . Influenza,inj,Quad PF,6+ Mos 10/08/2013, 10/08/2014, 10/16/2015, 10/12/2016  . PFIZER SARS-COV-2 Vaccination 02/09/2020, 03/03/2020  . Pneumococcal Conjugate-13 06/12/2018  . Pneumococcal Polysaccharide-23 04/22/2015  . Td 07/02/2003  . Tdap 04/16/2013  . Zoster 10/31/2012  . Zoster Recombinat (Shingrix) 06/10/2017, 08/23/2017    TDAP status: Up to date Flu Vaccine status: Up to date Pneumococcal vaccine status: Up to date Covid-19 vaccine status: Completed vaccines  Qualifies for Shingles Vaccine? Yes   Zostavax completed Yes   Shingrix Completed?: Yes  Screening Tests Health Maintenance  Topic Date Due  . PNA vac Low Risk Adult (2 of 2 - PPSV23) 04/21/2020  . INFLUENZA VACCINE  07/27/2020  . MAMMOGRAM  06/11/2021  . DEXA SCAN  06/24/2022  . COLONOSCOPY  07/25/2022  . TETANUS/TDAP  04/17/2023  . COVID-19 Vaccine  Completed  . Hepatitis C Screening  Completed    Health Maintenance  Health Maintenance Due    Topic Date Due  . PNA vac Low Risk Adult (2 of 2 - PPSV23) 04/21/2020    Colorectal cancer screening: Completed 07/25/12. Repeat every (not on file) years Mammogram status: Completed 06/11/20. Repeat every year Bone Density status: Completed 06/24/20. Results reflect: Bone density results: NORMAL. Repeat every 2 years.  Lung Cancer Screening: (Low Dose CT Chest  recommended if Age 50-80 years, 49 pack-year currently smoking OR have quit w/in 15years.) does not qualify.   Additional Screening:  Vision Screening: Recommended annual ophthalmology exams for early detection of glaucoma and other disorders of the eye. Is the patient up to date with their annual eye exam?  Yes  Who is the provider or what is the name of the office in which the patient attends annual eye exams? [facility].   Dental Screening: Recommended annual dental exams for proper oral hygiene  Community Resource Referral / Chronic Care Management: CRR required this visit?  No   CCM required this visit?  No      Plan:    Please schedule your next medicare wellness visit with me in 1 yr.  Continue to eat heart healthy diet (full of fruits, vegetables, whole grains, lean protein, water--limit salt, fat, and sugar intake) and increase physical activity as tolerated.  Continue doing brain stimulating activities (puzzles, reading, adult coloring books, staying active) to keep memory sharp.     I have personally reviewed and noted the following in the patient's chart:   . Medical and social history . Use of alcohol, tobacco or illicit drugs  . Current medications and supplements . Functional ability and status . Nutritional status . Physical activity . Advanced directives . List of other physicians . Hospitalizations, surgeries, and ER visits in previous 12 months . Vitals . Screenings to include cognitive, depression, and falls . Referrals and appointments  In addition, I have reviewed and discussed  with patient certain preventive protocols, quality metrics, and best practice recommendations. A written personalized care plan for preventive services as well as general preventive health recommendations were provided to patient.   Due to this being a telephonic visit, the after visit summary with patients personalized plan was offered to patient via mail or my-chart. Patient would like to access on my-chart.   Naaman Plummer [place], [place]   07/07/2020

## 2020-07-07 ENCOUNTER — Encounter: Payer: Self-pay | Admitting: *Deleted

## 2020-07-07 ENCOUNTER — Ambulatory Visit (INDEPENDENT_AMBULATORY_CARE_PROVIDER_SITE_OTHER): Payer: Medicare PPO | Admitting: *Deleted

## 2020-07-07 ENCOUNTER — Other Ambulatory Visit: Payer: Self-pay

## 2020-07-07 DIAGNOSIS — Z0000 Encounter for general adult medical examination without abnormal findings: Secondary | ICD-10-CM | POA: Diagnosis not present

## 2020-07-07 NOTE — Patient Instructions (Signed)
Tracy Thompson , Thank you for taking time to come for your Medicare Wellness Visit. I appreciate your ongoing commitment to your health goals. Please review the following plan we discussed and let me know if I can assist you in the future.   Screening recommendations/referrals: Colorectal cancer screening: Completed 07/25/12. Repeat every (not on file) years Mammogram status: Completed 06/11/20. Repeat every year Bone Density status: Completed 06/24/20. Results reflect: Bone density results: NORMAL. Repeat every 2 years. Recommended yearly ophthalmology/optometry visit for glaucoma screening and checkup Recommended yearly dental visit for hygiene and checkup  Vaccinations: TDAP status: Up to date Flu Vaccine status: Up to date Pneumococcal vaccine status: Up to date Covid-19 vaccine status: Completed vaccines  Advanced directives: Bring a copy of your living will and/or healthcare power of attorney to your next office visit.  Next appointment: Follow up in one year for your annual wellness visit    Preventive Care 65 Years and Older, Female Preventive care refers to lifestyle choices and visits with your health care provider that can promote health and wellness. What does preventive care include?  A yearly physical exam. This is also called an annual well check.  Dental exams once or twice a year.  Routine eye exams. Ask your health care provider how often you should have your eyes checked.  Personal lifestyle choices, including:  Daily care of your teeth and gums.  Regular physical activity.  Eating a healthy diet.  Avoiding tobacco and drug use.  Limiting alcohol use.  Practicing safe sex.  Taking low-dose aspirin every day.  Taking vitamin and mineral supplements as recommended by your health care provider. What happens during an annual well check? The services and screenings done by your health care provider during your annual well check will depend on your age, overall  health, lifestyle risk factors, and family history of disease. Counseling  Your health care provider may ask you questions about your:  Alcohol use.  Tobacco use.  Drug use.  Emotional well-being.  Home and relationship well-being.  Sexual activity.  Eating habits.  History of falls.  Memory and ability to understand (cognition).  Work and work Statistician.  Reproductive health. Screening  You may have the following tests or measurements:  Height, weight, and BMI.  Blood pressure.  Lipid and cholesterol levels. These may be checked every 5 years, or more frequently if you are over 30 years old.  Skin check.  Lung cancer screening. You may have this screening every year starting at age 10 if you have a 30-pack-year history of smoking and currently smoke or have quit within the past 15 years.  Fecal occult blood test (FOBT) of the stool. You may have this test every year starting at age 67.  Flexible sigmoidoscopy or colonoscopy. You may have a sigmoidoscopy every 5 years or a colonoscopy every 10 years starting at age 81.  Hepatitis C blood test.  Hepatitis B blood test.  Sexually transmitted disease (STD) testing.  Diabetes screening. This is done by checking your blood sugar (glucose) after you have not eaten for a while (fasting). You may have this done every 1-3 years.  Bone density scan. This is done to screen for osteoporosis. You may have this done starting at age 20.  Mammogram. This may be done every 1-2 years. Talk to your health care provider about how often you should have regular mammograms. Talk with your health care provider about your test results, treatment options, and if necessary, the need for more  tests. Vaccines  Your health care provider may recommend certain vaccines, such as:  Influenza vaccine. This is recommended every year.  Tetanus, diphtheria, and acellular pertussis (Tdap, Td) vaccine. You may need a Td booster every 10  years.  Zoster vaccine. You may need this after age 77.  Pneumococcal 13-valent conjugate (PCV13) vaccine. One dose is recommended after age 11.  Pneumococcal polysaccharide (PPSV23) vaccine. One dose is recommended after age 53. Talk to your health care provider about which screenings and vaccines you need and how often you need them. This information is not intended to replace advice given to you by your health care provider. Make sure you discuss any questions you have with your health care provider. Document Released: 01/09/2016 Document Revised: 09/01/2016 Document Reviewed: 10/14/2015 Elsevier Interactive Patient Education  2017 [place] Prevention in the Home Falls can cause injuries. They can happen to people of all ages. There are many things you can do to make your home safe and to help prevent falls. What can I do on the outside of my home?  Regularly fix the edges of walkways and driveways and fix any cracks.  Remove anything that might make you trip as you walk through a door, such as a raised step or threshold.  Trim any bushes or trees on the path to your home.  Use bright outdoor lighting.  Clear any walking paths of anything that might make someone trip, such as rocks or tools.  Regularly check to see if handrails are loose or broken. Make sure that both sides of any steps have handrails.  Any raised decks and porches should have guardrails on the edges.  Have any leaves, snow, or ice cleared regularly.  Use sand or salt on walking paths during winter.  Clean up any spills in your garage right away. This includes oil or grease spills. What can I do in the bathroom?  Use night lights.  Install grab bars by the toilet and in the tub and shower. Do not use towel bars as grab bars.  Use non-skid mats or decals in the tub or shower.  If you need to sit down in the shower, use a plastic, non-slip stool.  Keep the floor dry. Clean up any water that  spills on the floor as soon as it happens.  Remove soap buildup in the tub or shower regularly.  Attach bath mats securely with double-sided non-slip rug tape.  Do not have throw rugs and other things on the floor that can make you trip. What can I do in the bedroom?  Use night lights.  Make sure that you have a light by your bed that is easy to reach.  Do not use any sheets or blankets that are too big for your bed. They should not hang down onto the floor.  Have a firm chair that has side arms. You can use this for support while you get dressed.  Do not have throw rugs and other things on the floor that can make you trip. What can I do in the kitchen?  Clean up any spills right away.  Avoid walking on wet floors.  Keep items that you use a lot in easy-to-reach places.  If you need to reach something above you, use a strong step stool that has a grab bar.  Keep electrical cords out of the way.  Do not use floor polish or wax that makes floors slippery. If you must use wax, use non-skid floor  wax.  Do not have throw rugs and other things on the floor that can make you trip. What can I do with my stairs?  Do not leave any items on the stairs.  Make sure that there are handrails on both sides of the stairs and use them. Fix handrails that are broken or loose. Make sure that handrails are as long as the stairways.  Check any carpeting to make sure that it is firmly attached to the stairs. Fix any carpet that is loose or worn.  Avoid having throw rugs at the top or bottom of the stairs. If you do have throw rugs, attach them to the floor with carpet tape.  Make sure that you have a light switch at the top of the stairs and the bottom of the stairs. If you do not have them, ask someone to add them for you. What else can I do to help prevent falls?  Wear shoes that:  Do not have high heels.  Have rubber bottoms.  Are comfortable and fit you well.  Are closed at the  toe. Do not wear sandals.  If you use a stepladder:  Make sure that it is fully opened. Do not climb a closed stepladder.  Make sure that both sides of the stepladder are locked into place.  Ask someone to hold it for you, if possible.  Clearly mark and make sure that you can see:  Any grab bars or handrails.  First and last steps.  Where the edge of each step is.  Use tools that help you move around (mobility aids) if they are needed. These include:  Canes.  Walkers.  Scooters.  Crutches.  Turn on the lights when you go into a dark area. Replace any light bulbs as soon as they burn out.  Set up your furniture so you have a clear path. Avoid moving your furniture around.  If any of your floors are uneven, fix them.  If there are any pets around you, be aware of where they are.  Review your medicines with your doctor. Some medicines can make you feel dizzy. This can increase your chance of falling. Ask your doctor what other things that you can do to help prevent falls. This information is not intended to replace advice given to you by your health care provider. Make sure you discuss any questions you have with your health care provider. Document Released: 10/09/2009 Document Revised: 05/20/2016 Document Reviewed: 01/17/2015 Elsevier Interactive Patient Education  2017 [employer].

## 2020-07-08 ENCOUNTER — Other Ambulatory Visit: Payer: Self-pay

## 2020-07-08 ENCOUNTER — Encounter: Payer: Self-pay | Admitting: Family Medicine

## 2020-07-08 ENCOUNTER — Ambulatory Visit (INDEPENDENT_AMBULATORY_CARE_PROVIDER_SITE_OTHER): Payer: Medicare PPO | Admitting: Family Medicine

## 2020-07-08 VITALS — BP 108/80 | HR 67 | Temp 97.9°F | Resp 18 | Ht 66.0 in | Wt 204.8 lb

## 2020-07-08 DIAGNOSIS — H8110 Benign paroxysmal vertigo, unspecified ear: Secondary | ICD-10-CM

## 2020-07-08 DIAGNOSIS — E785 Hyperlipidemia, unspecified: Secondary | ICD-10-CM

## 2020-07-08 DIAGNOSIS — I10 Essential (primary) hypertension: Secondary | ICD-10-CM | POA: Diagnosis not present

## 2020-07-08 DIAGNOSIS — R739 Hyperglycemia, unspecified: Secondary | ICD-10-CM | POA: Diagnosis not present

## 2020-07-08 DIAGNOSIS — Z0000 Encounter for general adult medical examination without abnormal findings: Secondary | ICD-10-CM

## 2020-07-08 LAB — COMPREHENSIVE METABOLIC PANEL
ALT: 28 U/L (ref 0–35)
AST: 25 U/L (ref 0–37)
Albumin: 4.4 g/dL (ref 3.5–5.2)
Alkaline Phosphatase: 72 U/L (ref 39–117)
BUN: 12 mg/dL (ref 6–23)
CO2: 28 mEq/L (ref 19–32)
Calcium: 9.6 mg/dL (ref 8.4–10.5)
Chloride: 101 mEq/L (ref 96–112)
Creatinine, Ser: 0.9 mg/dL (ref 0.40–1.20)
GFR: 62.3 mL/min (ref >60.00)
Glucose, Bld: 106 mg/dL — ABNORMAL HIGH (ref 70–99)
Potassium: 3.7 mEq/L (ref 3.5–5.1)
Sodium: 138 mEq/L (ref 135–145)
Total Bilirubin: 0.7 mg/dL (ref 0.2–1.2)
Total Protein: 7 g/dL (ref 6.0–8.3)

## 2020-07-08 LAB — CBC WITH DIFFERENTIAL/PLATELET
Basophils Absolute: 0 10*3/uL (ref 0.0–0.1)
Basophils Relative: 0.6 % (ref 0.0–3.0)
Eosinophils Absolute: 0.1 10*3/uL (ref 0.0–0.7)
Eosinophils Relative: 1.7 % (ref 0.0–5.0)
HCT: 43.5 % (ref 36.0–46.0)
Hemoglobin: 14.6 g/dL (ref 12.0–15.0)
Lymphocytes Relative: 25.9 % (ref 12.0–46.0)
Lymphs Abs: 1.7 10*3/uL (ref 0.7–4.0)
MCHC: 33.6 g/dL (ref 30.0–36.0)
MCV: 90.8 fl (ref 78.0–100.0)
Monocytes Absolute: 0.6 10*3/uL (ref 0.1–1.0)
Monocytes Relative: 9 % (ref 3.0–12.0)
Neutro Abs: 4.2 10*3/uL (ref 1.4–7.7)
Neutrophils Relative %: 62.8 % (ref 43.0–77.0)
Platelets: 304 10*3/uL (ref 150.0–400.0)
RBC: 4.79 Mil/uL (ref 3.87–5.11)
RDW: 14.3 % (ref 11.5–15.5)
WBC: 6.7 10*3/uL (ref 4.0–10.5)

## 2020-07-08 LAB — LIPID PANEL
Cholesterol: 112 mg/dL (ref 0–200)
HDL: 50.5 mg/dL (ref >39.00)
LDL Cholesterol: 46 mg/dL (ref 0–99)
NonHDL: 61.69
Total CHOL/HDL Ratio: 2
Triglycerides: 77 mg/dL (ref 0.0–149.0)
VLDL: 15.4 mg/dL (ref 0.0–40.0)

## 2020-07-08 LAB — HEMOGLOBIN A1C: Hgb A1c MFr Bld: 6.4 % (ref 4.6–6.5)

## 2020-07-08 MED ORDER — MECLIZINE HCL 12.5 MG PO TABS: 12.5000 mg | ORAL_TABLET | Freq: Three times a day (TID) | ORAL | 0 refills | Status: DC | PRN

## 2020-07-08 NOTE — Progress Notes (Signed)
Subjective:     Tracy Thompson is a 69 y.o. female and is here for a comprehensive physical exam. The patient reports no problems. She also needs f/u bp, chol and glucose      Social History   Socioeconomic History  . Marital status: Married    Spouse name: Not on file  . Number of children: Not on file  . Years of education: Not on file  . Highest education level: Not on file  Occupational History  . Occupation: retired Product manager: RETIRED  Tobacco Use  . Smoking status: Never Smoker  . Smokeless tobacco: Never Used  Substance and Sexual Activity  . Alcohol use: No  . Drug use: No  . Sexual activity: Yes    Partners: Male  Other Topics Concern  . Not on file  Social History Narrative   Exercise--- walking   Social Determinants of Health   Financial Resource Strain: Low Risk   . Difficulty of Paying Living Expenses: Not hard at all  Food Insecurity: No Food Insecurity  . Worried About Charity fundraiser in the Last Year: Never true  . Ran Out of Food in the Last Year: Never true  Transportation Needs: No Transportation Needs  . Lack of Transportation (Medical): No  . Lack of Transportation (Non-Medical): No  Physical Activity:   . Days of Exercise per Week:   . Minutes of Exercise per Session:   Stress:   . Feeling of Stress :   Social Connections:   . Frequency of Communication with Friends and Family:   . Frequency of Social Gatherings with Friends and Family:   . Attends Religious Services:   . Active Member of Clubs or Organizations:   . Attends Archivist Meetings:   Marland Kitchen Marital Status:   Intimate Partner Violence:   . Fear of Current or Ex-Partner:   . Emotionally Abused:   Marland Kitchen Physically Abused:   . Sexually Abused:    Health Maintenance  Topic Date Due  . PNA vac Low Risk Adult (2 of 2 - PPSV23) 04/21/2020  . INFLUENZA VACCINE  07/27/2020  . MAMMOGRAM  06/11/2021  . DEXA SCAN  06/24/2022  . COLONOSCOPY  07/25/2022  . TETANUS/TDAP   04/17/2023  . COVID-19 Vaccine  Completed  . Hepatitis C Screening  Completed    The following portions of the patient's history were reviewed and updated as appropriate:  She  has a past medical history of Breast cancer ([place]) (1994), Cataracts, bilateral, Factor 5 Leiden mutation, heterozygous ([place]), Hypertension, and Menopause. She does not have any pertinent problems on file. She  has a past surgical history that includes Breast lumpectomy (Right, 1994); varicose veins (Bilateral, 2013); and Cholecystectomy (06/20/2002). Her family history includes COPD in her brother and mother; Cancer in her father and mother; Colon cancer in her mother; Deep vein thrombosis in her father; Dementia in her mother; Diabetes in her father; Hepatitis in her mother; Hepatitis C in her brother; Hyperlipidemia in her mother; Hypertension in her father and mother; Liver disease in an other family member; Mental illness in her brother; Prostate cancer in her father; Pulmonary embolism in her father; Schizophrenia in her brother; Stroke in her father and mother. She  reports that she has never smoked. She has never used smokeless tobacco. She reports that she does not drink alcohol and does not use drugs. She has a current medication list which includes the following prescription(s): amlodipine, aspirin, atorvastatin, bisoprolol-hydrochlorothiazide, cholecalciferol,  coenzyme q10, hydrochlorothiazide, meclizine, and potassium chloride sa. Current Outpatient Medications on File Prior to Visit  Medication Sig Dispense Refill  . amLODipine (NORVASC) 2.5 MG tablet TAKE 1 TABLET(2.5 MG) BY MOUTH DAILY 90 tablet 0  . aspirin 81 MG tablet Take 81 mg by mouth daily.      Marland Kitchen atorvastatin (LIPITOR) 10 MG tablet Take 1 tablet (10 mg total) by mouth daily. 90 tablet 0  . bisoprolol-hydrochlorothiazide (ZIAC) 10-6.25 MG tablet Take 1 tablet by mouth daily. 90 tablet 0  . cholecalciferol (VITAMIN D) 1000 units tablet Take 2,000 Units  by mouth daily.    . Coenzyme Q10 200 MG capsule Take 200 mg by mouth daily.    . hydrochlorothiazide (HYDRODIURIL) 25 MG tablet TAKE 1 TABLET(25 MG) BY MOUTH DAILY 90 tablet 0  . potassium chloride SA (KLOR-CON) 20 MEQ tablet Take 1 tablet (20 mEq total) by mouth daily. 90 tablet 0   No current facility-administered medications on file prior to visit.   She has No Known Allergies..  Review of Systems Review of Systems  Constitutional: Negative for activity change, appetite change and fatigue.  HENT: Negative for hearing loss, congestion, tinnitus and ear discharge.  dentist q72m Eyes: Negative for visual disturbance (see optho q1y -- vision corrected to 20/20 with glasses).  Respiratory: Negative for cough, chest tightness and shortness of breath.   Cardiovascular: Negative for chest pain, palpitations and leg swelling.  Gastrointestinal: Negative for abdominal pain, diarrhea, constipation and abdominal distention.  Genitourinary: Negative for urgency, frequency, decreased urine volume and difficulty urinating.  Musculoskeletal: Negative for back pain, arthralgias and gait problem.  Skin: Negative for color change, pallor and rash.  Neurological: Negative for dizziness, light-headedness, numbness and headaches.  Hematological: Negative for adenopathy. Does not bruise/bleed easily.  Psychiatric/Behavioral: Negative for suicidal ideas, confusion, sleep disturbance, self-injury, dysphoric mood, decreased concentration and agitation.       Objective:    BP 108/80 (BP Location: Left Arm, Patient Position: Sitting, Cuff Size: Large)   Pulse 67   Temp 97.9 F (36.6 C) (Temporal)   Resp 18   Ht 5\' 6"  (1.676 m)   Wt 204 lb 12.8 oz (92.9 kg)   SpO2 97%   BMI 33.06 kg/m  General appearance: alert, cooperative, appears stated age and no distress Head: Normocephalic, without obvious abnormality, atraumatic Eyes: negative findings: lids and lashes normal, conjunctivae and sclerae normal  and pupils equal, round, reactive to light and accomodation Ears: normal TM's and external ear canals both ears Neck: no adenopathy, no carotid bruit, no JVD, supple, symmetrical, trachea midline and thyroid not enlarged, symmetric, no tenderness/mass/nodules Back: symmetric, no curvature. ROM normal. No CVA tenderness. Lungs: clear to auscultation bilaterally Breasts: deferred-- pt did not want exam today Heart: regular rate and rhythm, S1, S2 normal, no murmur, click, rub or gallop Abdomen: soft, non-tender; bowel sounds normal; no masses,  no organomegaly Pelvic: deferred and not indicated; post-menopausal, no abnormal Pap smears in past Extremities: extremities normal, atraumatic, no cyanosis or edema Pulses: 2+ and symmetric Skin: Skin color, texture, turgor normal. No rashes or lesions Lymph nodes: Cervical, supraclavicular, and axillary nodes normal. Neurologic: Alert and oriented X 3, normal strength and tone. Normal symmetric reflexes. Normal coordination and gait    Assessment:    Healthy female exam.      Plan:    ghm utd  Check labs   1. Benign paroxysmal positional vertigo, unspecified laterality stable - meclizine (ANTIVERT) 12.5 MG tablet; Take 1 tablet (12.5  mg total) by mouth 3 (three) times daily as needed for dizziness.  Dispense: 30 tablet; Refill: 0  2. Essential hypertension Well controlled, no changes to meds. Encouraged heart healthy diet such as the DASH diet and exercise as tolerated.   - Comprehensive metabolic panel - CBC with Differential/Platelet  3. Dyslipidemia Encouraged heart healthy diet, increase exercise, avoid trans fats, consider a krill oil cap daily  - Lipid panel - Comprehensive metabolic panel - CBC with Differential/Platelet  4. Hyperglycemia Check labs  - Comprehensive metabolic panel - Hemoglobin A1c - CBC with Differential/Platelet  5. Preventative health care See above  - CBC with Differential/Platelet  See After Visit  Summary for Counseling Recommendations

## 2020-07-08 NOTE — Patient Instructions (Signed)
Preventive Care 68 Years and Older, Female Preventive care refers to lifestyle choices and visits with your health care provider that can promote health and wellness. This includes:  A yearly physical exam. This is also called an annual well check.  Regular dental and eye exams.  Immunizations.  Screening for certain conditions.  Healthy lifestyle choices, such as diet and exercise. What can I expect for my preventive care visit? Physical exam Your health care provider will check:  Height and weight. These may be used to calculate body mass index (BMI), which is a measurement that tells if you are at a healthy weight.  Heart rate and blood pressure.  Your skin for abnormal spots. Counseling Your health care provider may ask you questions about:  Alcohol, tobacco, and drug use.  Emotional well-being.  Home and relationship well-being.  Sexual activity.  Eating habits.  History of falls.  Memory and ability to understand (cognition).  Work and work Statistician.  Pregnancy and menstrual history. What immunizations do I need?  Influenza (flu) vaccine  This is recommended every year. Tetanus, diphtheria, and pertussis (Tdap) vaccine  You may need a Td booster every 10 years. Varicella (chickenpox) vaccine  You may need this vaccine if you have not already been vaccinated. Zoster (shingles) vaccine  You may need this after age 33. Pneumococcal conjugate (PCV13) vaccine  One dose is recommended after age 33. Pneumococcal polysaccharide (PPSV23) vaccine  One dose is recommended after age 72. Measles, mumps, and rubella (MMR) vaccine  You may need at least one dose of MMR if you were born in 1957 or later. You may also need a second dose. Meningococcal conjugate (MenACWY) vaccine  You may need this if you have certain conditions. Hepatitis A vaccine  You may need this if you have certain conditions or if you travel or work in places where you may be exposed  to hepatitis A. Hepatitis B vaccine  You may need this if you have certain conditions or if you travel or work in places where you may be exposed to hepatitis B. Haemophilus influenzae type b (Hib) vaccine  You may need this if you have certain conditions. You may receive vaccines as individual doses or as more than one vaccine together in one shot (combination vaccines). Talk with your health care provider about the risks and benefits of combination vaccines. What tests do I need? Blood tests  Lipid and cholesterol levels. These may be checked every 5 years, or more frequently depending on your overall health.  Hepatitis C test.  Hepatitis B test. Screening  Lung cancer screening. You may have this screening every year starting at age 39 if you have a 30-pack-year history of smoking and currently smoke or have quit within the past 15 years.  Colorectal cancer screening. All adults should have this screening starting at age 36 and continuing until age 15. Your health care provider may recommend screening at age 23 if you are at increased risk. You will have tests every 1-10 years, depending on your results and the type of screening test.  Diabetes screening. This is done by checking your blood sugar (glucose) after you have not eaten for a while (fasting). You may have this done every 1-3 years.  Mammogram. This may be done every 1-2 years. Talk with your health care provider about how often you should have regular mammograms.  BRCA-related cancer screening. This may be done if you have a family history of breast, ovarian, tubal, or peritoneal cancers.  Other tests  Sexually transmitted disease (STD) testing.  Bone density scan. This is done to screen for osteoporosis. You may have this done starting at age 44. Follow these instructions at home: Eating and drinking  Eat a diet that includes fresh fruits and vegetables, whole grains, lean protein, and low-fat dairy products. Limit  your intake of foods with high amounts of sugar, saturated fats, and salt.  Take vitamin and mineral supplements as recommended by your health care provider.  Do not drink alcohol if your health care provider tells you not to drink.  If you drink alcohol: ? Limit how much you have to 0-1 drink a day. ? Be aware of how much alcohol is in your drink. In the [place] one drink equals one 12 oz bottle of beer (355 mL), one 5 oz glass of wine (148 mL), or one 1 oz glass of hard liquor (44 mL). Lifestyle  Take daily care of your teeth and gums.  Stay active. Exercise for at least 30 minutes on 5 or more days each week.  Do not use any products that contain nicotine or tobacco, such as cigarettes, e-cigarettes, and chewing tobacco. If you need help quitting, ask your health care provider.  If you are sexually active, practice safe sex. Use a condom or other form of protection in order to prevent STIs (sexually transmitted infections).  Talk with your health care provider about taking a low-dose aspirin or statin. What's next?  Go to your health care provider once a year for a well check visit.  Ask your health care provider how often you should have your eyes and teeth checked.  Stay up to date on all vaccines. This information is not intended to replace advice given to you by your health care provider. Make sure you discuss any questions you have with your health care provider. Document Revised: 12/07/2018 Document Reviewed: 12/07/2018 Elsevier Patient Education  2020 [employer].

## 2020-08-02 ENCOUNTER — Other Ambulatory Visit: Payer: Self-pay | Admitting: Family Medicine

## 2020-08-02 DIAGNOSIS — E785 Hyperlipidemia, unspecified: Secondary | ICD-10-CM

## 2020-08-02 DIAGNOSIS — I10 Essential (primary) hypertension: Secondary | ICD-10-CM

## 2020-10-07 ENCOUNTER — Ambulatory Visit (INDEPENDENT_AMBULATORY_CARE_PROVIDER_SITE_OTHER): Payer: Medicare PPO | Admitting: *Deleted

## 2020-10-07 ENCOUNTER — Other Ambulatory Visit: Payer: Self-pay

## 2020-10-07 DIAGNOSIS — Z23 Encounter for immunization: Secondary | ICD-10-CM

## 2020-10-07 NOTE — Progress Notes (Signed)
Patient here for high dose flu injection.  Vaccine given in left deltoid and patient tolerated well.

## 2020-10-31 ENCOUNTER — Other Ambulatory Visit: Payer: Self-pay | Admitting: Family Medicine

## 2020-10-31 DIAGNOSIS — E785 Hyperlipidemia, unspecified: Secondary | ICD-10-CM

## 2020-10-31 DIAGNOSIS — I10 Essential (primary) hypertension: Secondary | ICD-10-CM

## 2020-11-03 ENCOUNTER — Other Ambulatory Visit: Payer: Self-pay | Admitting: Family Medicine

## 2020-11-11 ENCOUNTER — Other Ambulatory Visit (HOSPITAL_BASED_OUTPATIENT_CLINIC_OR_DEPARTMENT_OTHER): Payer: Self-pay | Admitting: Internal Medicine

## 2020-11-11 ENCOUNTER — Ambulatory Visit: Payer: Medicare PPO | Attending: Internal Medicine

## 2020-11-11 DIAGNOSIS — Z23 Encounter for immunization: Secondary | ICD-10-CM

## 2020-11-11 MED FILL — PFIZER-BIONTECH COVID-19 VA: 30 | 1 days supply | Qty: 0 | Fill #0

## 2020-11-11 NOTE — Progress Notes (Signed)
° °  Covid-19 Vaccination Clinic  Name:  Tracy Thompson    MRN: [id number] DOB: 04-24-1951  11/11/2020  [name] was observed post Covid-19 immunization for 15 minutes without incident. She was provided with Vaccine Information Sheet and instruction to access the V-Safe system.   [name] was instructed to call 911 with any severe reactions post vaccine:  Difficulty breathing   Swelling of face and throat   A fast heartbeat   A bad rash all over body   Dizziness and weakness   Immunizations Administered    Name Date Dose VIS Date Route   Pfizer COVID-19 Vaccine 11/11/2020 10:02 AM 0.3 mL 10/15/2020 Intramuscular   Manufacturer: [place]   Lot: [id number]   NDC: [address]-3

## 2021-01-30 ENCOUNTER — Other Ambulatory Visit: Payer: Self-pay | Admitting: Family Medicine

## 2021-02-06 ENCOUNTER — Other Ambulatory Visit: Payer: Self-pay | Admitting: Family Medicine

## 2021-02-06 MED ORDER — AMLODIPINE BESYLATE 2.5 MG PO TABS
2.5000 mg | ORAL_TABLET | Freq: Every day | ORAL | 0 refills | Status: DC
Start: 2021-02-06 — End: 2021-03-09

## 2021-03-09 ENCOUNTER — Other Ambulatory Visit: Payer: Self-pay | Admitting: Family Medicine

## 2021-03-11 ENCOUNTER — Other Ambulatory Visit: Payer: Self-pay | Admitting: Family Medicine

## 2021-03-12 MED ORDER — BISOPROLOL-HYDROCHLOROTHIAZIDE 10-6.25 MG PO TABS
1.0000 | ORAL_TABLET | Freq: Every day | ORAL | 0 refills | Status: DC
Start: 2021-03-12 — End: 2021-04-17

## 2021-03-12 MED ORDER — AMLODIPINE BESYLATE 2.5 MG PO TABS
2.5000 mg | ORAL_TABLET | Freq: Every day | ORAL | 0 refills | Status: DC
Start: 2021-03-12 — End: 2021-04-06

## 2021-03-20 ENCOUNTER — Other Ambulatory Visit: Payer: Self-pay

## 2021-03-20 ENCOUNTER — Encounter: Payer: Self-pay | Admitting: Family Medicine

## 2021-03-20 ENCOUNTER — Ambulatory Visit: Payer: Medicare PPO | Admitting: Family Medicine

## 2021-03-20 VITALS — BP 114/76 | HR 82 | Temp 98.5°F | Resp 18 | Ht 66.0 in | Wt 199.4 lb

## 2021-03-20 DIAGNOSIS — R739 Hyperglycemia, unspecified: Secondary | ICD-10-CM

## 2021-03-20 DIAGNOSIS — Z23 Encounter for immunization: Secondary | ICD-10-CM

## 2021-03-20 DIAGNOSIS — E785 Hyperlipidemia, unspecified: Secondary | ICD-10-CM | POA: Diagnosis not present

## 2021-03-20 DIAGNOSIS — I10 Essential (primary) hypertension: Secondary | ICD-10-CM

## 2021-03-20 LAB — COMPREHENSIVE METABOLIC PANEL
ALT: 26 U/L (ref 0–35)
AST: 23 U/L (ref 0–37)
Albumin: 4.4 g/dL (ref 3.5–5.2)
Alkaline Phosphatase: 80 U/L (ref 39–117)
BUN: 13 mg/dL (ref 6–23)
CO2: 29 mEq/L (ref 19–32)
Calcium: 9.4 mg/dL (ref 8.4–10.5)
Chloride: 101 mEq/L (ref 96–112)
Creatinine, Ser: 0.98 mg/dL (ref 0.40–1.20)
GFR: 59.3 mL/min — ABNORMAL LOW (ref >60.00)
Glucose, Bld: 106 mg/dL — ABNORMAL HIGH (ref 70–99)
Potassium: 3.8 mEq/L (ref 3.5–5.1)
Sodium: 139 mEq/L (ref 135–145)
Total Bilirubin: 0.7 mg/dL (ref 0.2–1.2)
Total Protein: 7.3 g/dL (ref 6.0–8.3)

## 2021-03-20 LAB — LIPID PANEL
Cholesterol: 128 mg/dL (ref 0–200)
HDL: 51.7 mg/dL (ref >39.00)
LDL Cholesterol: 60 mg/dL (ref 0–99)
NonHDL: 76.41
Total CHOL/HDL Ratio: 2
Triglycerides: 80 mg/dL (ref 0.0–149.0)
VLDL: 16 mg/dL (ref 0.0–40.0)

## 2021-03-20 LAB — HEMOGLOBIN A1C: Hgb A1c MFr Bld: 6.4 % (ref 4.6–6.5)

## 2021-03-20 MED ORDER — HYDROCHLOROTHIAZIDE 25 MG PO TABS: ORAL_TABLET | ORAL | 1 refills | Status: DC

## 2021-03-20 NOTE — Assessment & Plan Note (Signed)
Well controlled, no changes to meds. Encouraged heart healthy diet such as the DASH diet and exercise as tolerated.  °

## 2021-03-20 NOTE — Progress Notes (Signed)
Subjective:    Patient ID: Tracy Thompson, female    DOB: [birth year], 70 y.o.   MRN: [id number]  Chief Complaint  Patient presents with  . Hypertension  . Hyperlipidemia  . Follow-up    HPI Patient is in today for f/u bp and cholesterol   No complaints   Past Medical History:  Diagnosis Date  . Breast cancer ([place]) 1994   s/p chemo 8/94-1/95, radiation 8/94-11/94  . Cataracts, bilateral   . Factor 5 Leiden mutation, heterozygous [facility])    also has Factor 8 problems  . Hypertension   . Menopause    chemo induced    Past Surgical History:  Procedure Laterality Date  . BREAST LUMPECTOMY Right 1994   w/ radiation and chemotherapy following lumpectomy  . CHOLECYSTECTOMY  06/20/2002   Laparoscopic cholestectomy  . varicose veins Bilateral 2013   laser surgery   Dr. Jones Skene    Family History  Problem Relation Age of Onset  . Cancer Mother        Bladder CA, colon  . Colon cancer Mother   . Stroke Mother   . Hypertension Mother   . Hyperlipidemia Mother   . Hepatitis Mother        hep A  . COPD Mother   . Dementia Mother   . Hypertension Father   . Prostate cancer Father   . Diabetes Father   . Stroke Father   . Pulmonary embolism Father   . Deep vein thrombosis Father   . Cancer Father        prostate cancer  . Schizophrenia Brother   . Mental illness Brother   . COPD Brother   . Hepatitis C Brother   . Liver disease Other     Social History   Socioeconomic History  . Marital status: Married    Spouse name: Not on file  . Number of children: Not on file  . Years of education: Not on file  . Highest education level: Not on file  Occupational History  . Occupation: retired Product manager: RETIRED  Tobacco Use  . Smoking status: Never Smoker  . Smokeless tobacco: Never Used  Substance and Sexual Activity  . Alcohol use: No  . Drug use: No  . Sexual activity: Yes    Partners: Male  Other Topics Concern  . Not on file  Social History  Narrative   Exercise--- walking   Social Determinants of Health   Financial Resource Strain: Low Risk   . Difficulty of Paying Living Expenses: Not hard at all  Food Insecurity: No Food Insecurity  . Worried About Charity fundraiser in the Last Year: Never true  . Ran Out of Food in the Last Year: Never true  Transportation Needs: No Transportation Needs  . Lack of Transportation (Medical): No  . Lack of Transportation (Non-Medical): No  Physical Activity: Not on file  Stress: Not on file  Social Connections: Not on file  Intimate Partner Violence: Not on file    Outpatient Medications Prior to Visit  Medication Sig Dispense Refill  . amLODipine (NORVASC) 2.5 MG tablet Take 1 tablet (2.5 mg total) by mouth daily. 30 tablet 0  . aspirin 81 MG tablet Take 81 mg by mouth daily.    Marland Kitchen atorvastatin (LIPITOR) 10 MG tablet TAKE 1 TABLET(10 MG) BY MOUTH DAILY 90 tablet 1  . bisoprolol-hydrochlorothiazide (ZIAC) 10-6.25 MG tablet Take 1 tablet by mouth daily. 30 tablet 0  . cholecalciferol (  VITAMIN D) 1000 units tablet Take 2,000 Units by mouth daily.    . Coenzyme Q10 200 MG capsule Take 200 mg by mouth daily.    . meclizine (ANTIVERT) 12.5 MG tablet Take 1 tablet (12.5 mg total) by mouth 3 (three) times daily as needed for dizziness. 30 tablet 0  . potassium chloride SA (KLOR-CON) 20 MEQ tablet TAKE 1 TABLET(20 MEQ) BY MOUTH DAILY 90 tablet 1  . hydrochlorothiazide (HYDRODIURIL) 25 MG tablet TAKE 1 TABLET(25 MG) BY MOUTH DAILY 90 tablet 1   No facility-administered medications prior to visit.    No Known Allergies  Review of Systems  Constitutional: Negative for chills, fever and malaise/fatigue.  HENT: Negative for congestion and hearing loss.   Eyes: Negative for discharge.  Respiratory: Negative for cough, sputum production and shortness of breath.   Cardiovascular: Negative for chest pain, palpitations and leg swelling.  Gastrointestinal: Negative for abdominal pain, blood in  stool, constipation, diarrhea, heartburn, nausea and vomiting.  Genitourinary: Negative for dysuria, frequency, hematuria and urgency.  Musculoskeletal: Negative for back pain, falls and myalgias.  Skin: Negative for rash.  Neurological: Negative for dizziness, sensory change, loss of consciousness, weakness and headaches.  Endo/Heme/Allergies: Negative for environmental allergies. Does not bruise/bleed easily.  Psychiatric/Behavioral: Negative for depression and suicidal ideas. The patient is not nervous/anxious and does not have insomnia.        Objective:    Physical Exam Vitals and nursing note reviewed.  Constitutional:      Appearance: She is well-developed.  HENT:     Head: Normocephalic and atraumatic.  Eyes:     Conjunctiva/sclera: Conjunctivae normal.  Neck:     Thyroid: No thyromegaly.     Vascular: No carotid bruit or JVD.  Cardiovascular:     Rate and Rhythm: Normal rate and regular rhythm.     Heart sounds: Normal heart sounds. No murmur heard.   Pulmonary:     Effort: Pulmonary effort is normal. No respiratory distress.     Breath sounds: Normal breath sounds. No wheezing or rales.  Chest:     Chest wall: No tenderness.  Musculoskeletal:     Cervical back: Normal range of motion and neck supple.  Neurological:     Mental Status: She is alert and oriented to person, place, and time.     BP 114/76 (BP Location: Right Arm, Patient Position: Sitting, Cuff Size: Large)   Pulse 82   Temp 98.5 F (36.9 C) (Oral)   Resp 18   Ht 5\' 6"  (1.676 m)   Wt 199 lb 6.4 oz (90.4 kg)   SpO2 97%   BMI 32.18 kg/m  Wt Readings from Last 3 Encounters:  03/20/21 199 lb 6.4 oz (90.4 kg)  07/08/20 204 lb 12.8 oz (92.9 kg)  06/19/19 201 lb (91.2 kg)    Diabetic Foot Exam - Simple   No data filed    Lab Results  Component Value Date   WBC 6.7 07/08/2020   HGB 14.6 07/08/2020   HCT 43.5 07/08/2020   PLT 304.0 07/08/2020   GLUCOSE 106 (H) 07/08/2020   CHOL 112  07/08/2020   TRIG 77.0 07/08/2020   HDL 50.50 07/08/2020   LDLDIRECT 135.7 02/11/2012   LDLCALC 46 07/08/2020   ALT 28 07/08/2020   AST 25 07/08/2020   NA 138 07/08/2020   K 3.7 07/08/2020   CL 101 07/08/2020   CREATININE 0.90 07/08/2020   BUN 12 07/08/2020   CO2 28 07/08/2020   TSH 3.69  06/10/2017   HGBA1C 6.4 07/08/2020   MICROALBUR <0.7 05/31/2016    Lab Results  Component Value Date   TSH 3.69 06/10/2017   Lab Results  Component Value Date   WBC 6.7 07/08/2020   HGB 14.6 07/08/2020   HCT 43.5 07/08/2020   MCV 90.8 07/08/2020   PLT 304.0 07/08/2020   Lab Results  Component Value Date   NA 138 07/08/2020   K 3.7 07/08/2020   CO2 28 07/08/2020   GLUCOSE 106 (H) 07/08/2020   BUN 12 07/08/2020   CREATININE 0.90 07/08/2020   BILITOT 0.7 07/08/2020   ALKPHOS 72 07/08/2020   AST 25 07/08/2020   ALT 28 07/08/2020   PROT 7.0 07/08/2020   ALBUMIN 4.4 07/08/2020   CALCIUM 9.6 07/08/2020   ANIONGAP 14 05/05/2016   GFR 62.30 07/08/2020   Lab Results  Component Value Date   CHOL 112 07/08/2020   Lab Results  Component Value Date   HDL 50.50 07/08/2020   Lab Results  Component Value Date   LDLCALC 46 07/08/2020   Lab Results  Component Value Date   TRIG 77.0 07/08/2020   Lab Results  Component Value Date   CHOLHDL 2 07/08/2020   Lab Results  Component Value Date   HGBA1C 6.4 07/08/2020       Assessment & Plan:   Problem List Items Addressed This Visit      Unprioritized   Essential hypertension - Primary    Well controlled, no changes to meds. Encouraged heart healthy diet such as the DASH diet and exercise as tolerated.       Relevant Medications   hydrochlorothiazide (HYDRODIURIL) 25 MG tablet   Other Relevant Orders   Lipid panel   Comprehensive metabolic panel   Hyperlipidemia    Encouraged heart healthy diet, increase exercise, avoid trans fats, consider a krill oil cap daily      Relevant Medications   hydrochlorothiazide  (HYDRODIURIL) 25 MG tablet    Other Visit Diagnoses    Hyperglycemia       Relevant Orders   Hemoglobin A1c   Need for pneumococcal vaccination       Relevant Orders   Pneumococcal polysaccharide vaccine 23-valent greater than or equal to 2yo subcutaneous/IM (Completed)      I have changed Haylie P. Goodine "[name]"'s hydrochlorothiazide. I am also having her maintain her aspirin, Coenzyme Q10, cholecalciferol, meclizine, potassium chloride SA, atorvastatin, bisoprolol-hydrochlorothiazide, and amLODipine.  Meds ordered this encounter  Medications  . hydrochlorothiazide (HYDRODIURIL) 25 MG tablet    Sig: 1 po qd    Dispense:  90 tablet    Refill:  1     [name], DO

## 2021-03-20 NOTE — Assessment & Plan Note (Signed)
Encouraged heart healthy diet, increase exercise, avoid trans fats, consider a krill oil cap daily 

## 2021-03-20 NOTE — Patient Instructions (Signed)

## 2021-04-06 ENCOUNTER — Other Ambulatory Visit: Payer: Self-pay | Admitting: Family Medicine

## 2021-04-17 ENCOUNTER — Other Ambulatory Visit: Payer: Self-pay | Admitting: Family Medicine

## 2021-05-11 ENCOUNTER — Other Ambulatory Visit: Payer: Self-pay | Admitting: Family Medicine

## 2021-05-11 DIAGNOSIS — E785 Hyperlipidemia, unspecified: Secondary | ICD-10-CM

## 2021-05-19 DIAGNOSIS — H2513 Age-related nuclear cataract, bilateral: Secondary | ICD-10-CM | POA: Diagnosis not present

## 2021-05-19 DIAGNOSIS — H524 Presbyopia: Secondary | ICD-10-CM | POA: Diagnosis not present

## 2021-05-26 ENCOUNTER — Ambulatory Visit: Payer: Medicare PPO

## 2021-05-26 NOTE — Progress Notes (Signed)
   Covid-19 Vaccination Clinic  Name:  Tracy Thompson    MRN: [id number] DOB: 05-27-1951  05/26/2021  Tracy Thompson was observed post Covid-19 immunization for 15 minutes without incident. She was provided with Vaccine Information Sheet and instruction to access the V-Safe system.   Tracy Thompson was instructed to call 911 with any severe reactions post vaccine: Marland Kitchen Difficulty breathing  . Swelling of face and throat  . A fast heartbeat  . A bad rash all over body  . Dizziness and weakness

## 2021-06-01 ENCOUNTER — Other Ambulatory Visit (HOSPITAL_BASED_OUTPATIENT_CLINIC_OR_DEPARTMENT_OTHER): Payer: Self-pay

## 2021-06-01 MED ORDER — PFIZER-BIONT COVID-19 VAC-TRIS 30 MCG/0.3ML IM SUSP
INTRAMUSCULAR | 0 refills | Status: DC
  Filled 2021-06-01: qty 0.3, 1d supply, fill #0

## 2021-07-17 NOTE — Progress Notes (Signed)
Subjective:   Tracy Thompson is a 70 y.o. female who presents for Medicare Annual (Subsequent) preventive examination.  I connected with Shakeeta today by telephone and verified that I am speaking with the correct person using two identifiers. Location patient: home Location provider: work Persons participating in the virtual visit: patient, Marine scientist.    I discussed the limitations, risks, security and privacy concerns of performing an evaluation and management service by telephone and the availability of in person appointments. I also discussed with the patient that there may be a patient responsible charge related to this service. The patient expressed understanding and verbally consented to this telephonic visit.    Interactive audio and video telecommunications were attempted between this provider and patient, however failed, due to patient having technical difficulties OR patient did not have access to video capability.  We continued and completed visit with audio only.  Some vital signs may be absent or patient reported.   Time Spent with patient on telephone encounter: 20 minutes   Review of Systems     Cardiac Risk Factors include: advanced age (>47men, >6 women);hypertension;dyslipidemia;obesity (BMI >30kg/m2)     Objective:    Today's Vitals   07/20/21 0900  Weight: 199 lb (90.3 kg)  Height: 5\' 6"  (1.676 m)   Body mass index is 32.12 kg/m.  Advanced Directives 07/20/2021 07/07/2020 07/02/2019 03/05/2019 06/12/2018 05/05/2016  Does Patient Have a Medical Advance Directive? Yes Yes Yes Yes Yes Yes  Type of Paramedic of [place];Living will [place];Living will [place];Living will [place];Living will [place];Living will [place];Living will  Does patient want to make changes to medical advance directive? - No - Patient declined No - Patient declined No -  Patient declined No - Patient declined -  Copy of [place] in Chart? Yes - validated most recent copy scanned in chart (See row information) Yes - validated most recent copy scanned in chart (See row information) Yes - validated most recent copy scanned in chart (See row information) Yes - validated most recent copy scanned in chart (See row information) No - copy requested No - copy requested    Current Medications (verified) Outpatient Encounter Medications as of 07/20/2021  Medication Sig   amLODipine (NORVASC) 2.5 MG tablet Take 1 tablet (2.5 mg total) by mouth daily.   aspirin 81 MG tablet Take 81 mg by mouth daily.   atorvastatin (LIPITOR) 10 MG tablet Take 1 tablet (10 mg total) by mouth daily.   bisoprolol-hydrochlorothiazide (ZIAC) 10-6.25 MG tablet Take 1 tablet by mouth daily.   cholecalciferol (VITAMIN D) 1000 units tablet Take 2,000 Units by mouth daily.   Coenzyme Q10 200 MG capsule Take 200 mg by mouth daily.   hydrochlorothiazide (HYDRODIURIL) 25 MG tablet 1 po qd   meclizine (ANTIVERT) 12.5 MG tablet Take 1 tablet (12.5 mg total) by mouth 3 (three) times daily as needed for dizziness.   potassium chloride SA (KLOR-CON) 20 MEQ tablet Take 1 tablet (20 mEq total) by mouth daily.   [DISCONTINUED] COVID-19 mRNA Vac-TriS, Pfizer, (PFIZER-BIONT COVID-19 VAC-TRIS) SUSP injection Inject into the muscle.   [DISCONTINUED] COVID-19 mRNA vaccine, Pfizer, 30 MCG/0.3ML injection AS DIRECTED   No facility-administered encounter medications on file as of 07/20/2021.    Allergies (verified) Patient has no known allergies.   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 Jones Skene   Family History  Problem Relation Age of Onset   Cancer Mother        Bladder CA, colon   Colon cancer Mother    Stroke Mother    Hypertension Mother    Hyperlipidemia Mother    Hepatitis Mother        hep A   COPD Mother    Dementia Mother    Hypertension Father    Prostate cancer Father    Diabetes Father    Stroke Father    Pulmonary embolism Father    Deep vein thrombosis Father    Cancer Father        prostate cancer   Schizophrenia Brother    Mental illness Brother    COPD Brother    Hepatitis C Brother    Liver disease Other    Social History   Socioeconomic History   Marital status: Married    Spouse name: Not on file   Number of children: Not on file   Years of education: Not on file   Highest education level: Not on file  Occupational History   Occupation: retired Product manager: RETIRED  Tobacco Use   Smoking status: Never   Smokeless tobacco: Never  Substance and Sexual Activity   Alcohol use: No   Drug use: No   Sexual activity: Yes    Partners: Male  Other Topics Concern   Not on file  Social History Narrative   Exercise--- walking   Social Determinants of Health   Financial Resource Strain: Low Risk    Difficulty of Paying Living Expenses: Not hard at all  Food Insecurity: Not on file  Transportation Needs: No Transportation Needs   Lack of Transportation (Medical): No   Lack of Transportation (Non-Medical): No  Physical Activity: Sufficiently Active   Days of Exercise per Week: 7 days   Minutes of Exercise per Session: 30 min  Stress: No Stress Concern Present   Feeling of Stress : Not at all  Social Connections: Moderately Isolated   Frequency of Communication with Friends and Family: More than three times a week   Frequency of Social Gatherings  with Friends and Family: More than three times a week   Attends Religious Services: Never   Marine scientist or Organizations: No   Attends Music therapist: Never   Marital Status: Married    Tobacco Counseling Counseling given: Not Answered   Clinical Intake:  Pre-visit preparation completed: Yes  Pain : No/denies pain     Nutritional Status: BMI > 30  Obese Nutritional Risks: None Diabetes: No  How often do you need to have someone help you when you read instructions, pamphlets, or other written materials from your doctor or pharmacy?: 1 - Never  Diabetic?No  Interpreter Needed?: No  Information entered by :: Caroleen Hamman LPN   Activities of Daily Living In your present state of health, do you have any difficulty performing the following activities: 07/20/2021  Hearing? N  Vision?  N  Difficulty concentrating or making decisions? N  Walking or climbing stairs? N  Dressing or bathing? N  Doing errands, shopping? N  Preparing Food and eating ? N  Using the Toilet? N  In the past six months, have you accidently leaked urine? N  Do you have problems with loss of bowel control? N  Managing your Medications? N  Managing your Finances? N  Housekeeping or managing your Housekeeping? N  Some recent data might be hidden    Patient Care Team: Carollee Herter, Alferd Apa, DO as PCP - General Elza Rafter, MD as Consulting Physician (Family Medicine) Dermatology, [facility], MD as Consulting Physician (Ophthalmology)  Indicate any recent Medical Services you may have received from other than [facility] providers in the past year (date may be approximate).     Assessment:   This is a routine wellness examination for Jezlyn.  Hearing/Vision screen Vision Screening - Comments:: Last eye exam-04/2021-Dr. Bowen  Dietary issues and exercise activities discussed: Current Exercise Habits: Home exercise routine, Type of exercise: walking,  Time (Minutes): 30, Frequency (Times/Week): 7, Weekly Exercise (Minutes/Week): 210, Intensity: Mild, Exercise limited by: None identified   Goals Addressed             This Visit's Progress    Maintain healthy active lifestyle.   On track    Patient Stated       Drink more water       Depression Screen PHQ 2/9 Scores 07/20/2021 07/07/2020 07/02/2019 06/12/2018 05/31/2016 04/16/2013  PHQ - 2 Score 0 0 0 0 0 0    Fall Risk Fall Risk  07/20/2021 07/07/2020 07/02/2019 06/12/2018  Falls in the past year? 0 0 0 No  Number falls in past yr: 0 0 - -  Injury with Fall? 0 0 - -  Follow up Falls prevention discussed Education provided;Falls prevention discussed - -    FALL RISK PREVENTION PERTAINING TO THE HOME:  Any stairs in or around the home? No  Home free of loose throw rugs in walkways, pet beds, electrical cords, etc? Yes  Adequate lighting in your home to reduce risk of falls? Yes   ASSISTIVE DEVICES UTILIZED TO PREVENT FALLS:  Life alert? No  Use of a cane, walker or w/c? No  Grab bars in the bathroom? No  Shower chair or bench in shower? No  Elevated toilet seat or a handicapped toilet? No   TIMED UP AND GO:  Was the test performed? No . Phone visit   Cognitive Function:Normal cognitive status assessed by  this Nurse Health Advisor. No abnormalities found.   MMSE - Mini Mental State Exam 06/12/2018  Orientation to time 5  Orientation to Place 5  Registration 3  Attention/ Calculation 5  Recall 3  Language- name 2 objects 2  Language- repeat 1  Language- follow 3 step command 3  Language- read & follow direction 1  Write a sentence 1  Copy design 1  Total score 30        Immunizations Immunization History  Administered Date(s) Administered   Fluad Quad(high Dose 65+) 09/11/2019, 10/07/2020   Hep A / Hep B 01/26/2011, 03/30/2011, 07/29/2011   Influenza Split 09/20/2011, 10/17/2012   Influenza Whole 09/26/2008, 10/08/2009, 10/19/2010   Influenza, High Dose  Seasonal PF 10/11/2017, 10/16/2018   Influenza,inj,Quad PF,6+ Mos 10/08/2013, 10/08/2014, 10/16/2015, 10/12/2016   PFIZER Comirnaty(Gray Top)Covid-19 Tri-Sucrose Vaccine 05/26/2021   PFIZER(Purple Top)SARS-COV-2 Vaccination 02/09/2020, 03/03/2020, 11/11/2020   Pneumococcal Conjugate-13 06/12/2018   Pneumococcal  Polysaccharide-23 04/22/2015, 03/20/2021   Td 07/02/2003   Tdap 04/16/2013   Zoster Recombinat (Shingrix) 06/10/2017, 08/23/2017   Zoster, Live 10/31/2012    TDAP status: Up to date  Flu Vaccine status: Up to date  Pneumococcal vaccine status: Up to date  Covid-19 vaccine status: Completed vaccines  Qualifies for Shingles Vaccine? No   Zostavax completed Yes   Shingrix Completed?: Yes  Screening Tests Health Maintenance  Topic Date Due   MAMMOGRAM  06/11/2021   INFLUENZA VACCINE  07/27/2021   COVID-19 Vaccine (5 - Booster for Pfizer series) 09/25/2021   DEXA SCAN  06/24/2022   COLONOSCOPY (Pts 45-25yrs Insurance coverage will need to be confirmed)  07/25/2022   TETANUS/TDAP  04/17/2023   Hepatitis C Screening  Completed   PNA vac Low Risk Adult  Completed   Zoster Vaccines- Shingrix  Completed   HPV VACCINES  Aged Out    Health Maintenance  Health Maintenance Due  Topic Date Due   MAMMOGRAM  06/11/2021    Colorectal cancer screening: Type of screening: Colonoscopy. Completed 07/25/2012. Repeat every 10 years  Mammogram status: Scheduled for 08/24/2021  Bone Density status: Completed 06/24/2020. Results reflect: Bone density results: NORMAL. Repeat every 2 years.  Lung Cancer Screening: (Low Dose CT Chest recommended if Age 110-80 years, 30 pack-year currently smoking OR have quit w/in 15years.) does not qualify.    Additional Screening:  Hepatitis C Screening: Completed 01/26/2011  Vision Screening: Recommended annual ophthalmology exams for early detection of glaucoma and other disorders of the eye. Is the patient up to date with their annual eye exam?   Yes  Who is the provider or what is the name of the office in which the patient attends annual eye exams? Dr. Valetta Close   Dental Screening: Recommended annual dental exams for proper oral hygiene  Community Resource Referral / Chronic Care Management: CRR required this visit?  No   CCM required this visit?  No      Plan:     I have personally reviewed and noted the following in the patient's chart:   Medical and social history Use of alcohol, tobacco or illicit drugs  Current medications and supplements including opioid prescriptions.  Functional ability and status Nutritional status Physical activity Advanced directives List of other physicians Hospitalizations, surgeries, and ER visits in previous 12 months Vitals Screenings to include cognitive, depression, and falls Referrals and appointments  In addition, I have reviewed and discussed with patient certain preventive protocols, quality metrics, and best practice recommendations. A written personalized care plan for preventive services as well as general preventive health recommendations were provided to patient.   Due to this being a telephonic visit, the after visit summary with patients personalized plan was offered to patient via mail or my-chart. Patient would like to access on my-chart.   Marta Antu, LPN   [id number]  Nurse Health Advisor  Nurse Notes: None

## 2021-07-20 ENCOUNTER — Ambulatory Visit (INDEPENDENT_AMBULATORY_CARE_PROVIDER_SITE_OTHER): Payer: Medicare PPO

## 2021-07-20 VITALS — Ht 66.0 in | Wt 199.0 lb

## 2021-07-20 DIAGNOSIS — Z0000 Encounter for general adult medical examination without abnormal findings: Secondary | ICD-10-CM | POA: Diagnosis not present

## 2021-07-20 NOTE — Patient Instructions (Signed)
Tracy Thompson , Thank you for taking time to complete your Medicare Wellness Visit. I appreciate your ongoing commitment to your health goals. Please review the following plan we discussed and let me know if I can assist you in the future.   Screening recommendations/referrals: Colonoscopy: Completed 07/25/2012-Due 07/25/2022 Mammogram: Scheduled for 08/24/21 Bone Density: Completed 06/24/2020-Due 06/24/2022 Recommended yearly ophthalmology/optometry visit for glaucoma screening and checkup Recommended yearly dental visit for hygiene and checkup  Vaccinations: Influenza vaccine: Up to date Pneumococcal vaccine: Up to date Tdap vaccine: Up to date-Due-04/17/2023 Shingles vaccine: Completed vaccines   Covid-19:Up to date  Advanced directives: Copy in chart  Conditions/risks identified: See problem list  Next appointment: Follow up in one year for your annual wellness visit 07/22/2022 @ 9:00   Preventive Care 65 Years and Older, Female Preventive care refers to lifestyle choices and visits with your health care provider that can promote health and wellness. What does preventive care include? A yearly physical exam. This is also called an annual well check. Dental exams once or twice a year. Routine eye exams. Ask your health care provider how often you should have your eyes checked. Personal lifestyle choices, including: Daily care of your teeth and gums. Regular physical activity. Eating a healthy diet. Avoiding tobacco and drug use. Limiting alcohol use. Practicing safe sex. Taking low-dose aspirin every day. Taking vitamin and mineral supplements as recommended by your health care provider. What happens during an annual well check? The services and screenings done by your health care provider during your annual well check will depend on your age, overall health, lifestyle risk factors, and family history of disease. Counseling  Your health care provider may ask you questions about  your: Alcohol use. Tobacco use. Drug use. Emotional well-being. Home and relationship well-being. Sexual activity. Eating habits. History of falls. Memory and ability to understand (cognition). Work and work Statistician. Reproductive health. Screening  You may have the following tests or measurements: Height, weight, and BMI. Blood pressure. Lipid and cholesterol levels. These may be checked every 5 years, or more frequently if you are over 19 years old. Skin check. Lung cancer screening. You may have this screening every year starting at age 32 if you have a 30-pack-year history of smoking and currently smoke or have quit within the past 15 years. Fecal occult blood test (FOBT) of the stool. You may have this test every year starting at age 52. Flexible sigmoidoscopy or colonoscopy. You may have a sigmoidoscopy every 5 years or a colonoscopy every 10 years starting at age 6. Hepatitis C blood test. Hepatitis B blood test. Sexually transmitted disease (STD) testing. Diabetes screening. This is done by checking your blood sugar (glucose) after you have not eaten for a while (fasting). You may have this done every 1-3 years. Bone density scan. This is done to screen for osteoporosis. You may have this done starting at age 29. Mammogram. This may be done every 1-2 years. Talk to your health care provider about how often you should have regular mammograms. Talk with your health care provider about your test results, treatment options, and if necessary, the need for more tests. Vaccines  Your health care provider may recommend certain vaccines, such as: Influenza vaccine. This is recommended every year. Tetanus, diphtheria, and acellular pertussis (Tdap, Td) vaccine. You may need a Td booster every 10 years. Zoster vaccine. You may need this after age 67. Pneumococcal 13-valent conjugate (PCV13) vaccine. One dose is recommended after age 38. Pneumococcal polysaccharide (PPSV23)  vaccine.  One dose is recommended after age 48. Talk to your health care provider about which screenings and vaccines you need and how often you need them. This information is not intended to replace advice given to you by your health care provider. Make sure you discuss any questions you have with your health care provider. Document Released: 01/09/2016 Document Revised: 09/01/2016 Document Reviewed: 10/14/2015 Elsevier Interactive Patient Education  2017 [place] Prevention in the Home Falls can cause injuries. They can happen to people of all ages. There are many things you can do to make your home safe and to help prevent falls. What can I do on the outside of my home? Regularly fix the edges of walkways and driveways and fix any cracks. Remove anything that might make you trip as you walk through a door, such as a raised step or threshold. Trim any bushes or trees on the path to your home. Use bright outdoor lighting. Clear any walking paths of anything that might make someone trip, such as rocks or tools. Regularly check to see if handrails are loose or broken. Make sure that both sides of any steps have handrails. Any raised decks and porches should have guardrails on the edges. Have any leaves, snow, or ice cleared regularly. Use sand or salt on walking paths during winter. Clean up any spills in your garage right away. This includes oil or grease spills. What can I do in the bathroom? Use night lights. Install grab bars by the toilet and in the tub and shower. Do not use towel bars as grab bars. Use non-skid mats or decals in the tub or shower. If you need to sit down in the shower, use a plastic, non-slip stool. Keep the floor dry. Clean up any water that spills on the floor as soon as it happens. Remove soap buildup in the tub or shower regularly. Attach bath mats securely with double-sided non-slip rug tape. Do not have throw rugs and other things on the floor that can make  you trip. What can I do in the bedroom? Use night lights. Make sure that you have a light by your bed that is easy to reach. Do not use any sheets or blankets that are too big for your bed. They should not hang down onto the floor. Have a firm chair that has side arms. You can use this for support while you get dressed. Do not have throw rugs and other things on the floor that can make you trip. What can I do in the kitchen? Clean up any spills right away. Avoid walking on wet floors. Keep items that you use a lot in easy-to-reach places. If you need to reach something above you, use a strong step stool that has a grab bar. Keep electrical cords out of the way. Do not use floor polish or wax that makes floors slippery. If you must use wax, use non-skid floor wax. Do not have throw rugs and other things on the floor that can make you trip. What can I do with my stairs? Do not leave any items on the stairs. Make sure that there are handrails on both sides of the stairs and use them. Fix handrails that are broken or loose. Make sure that handrails are as long as the stairways. Check any carpeting to make sure that it is firmly attached to the stairs. Fix any carpet that is loose or worn. Avoid having throw rugs at the top or bottom  of the stairs. If you do have throw rugs, attach them to the floor with carpet tape. Make sure that you have a light switch at the top of the stairs and the bottom of the stairs. If you do not have them, ask someone to add them for you. What else can I do to help prevent falls? Wear shoes that: Do not have high heels. Have rubber bottoms. Are comfortable and fit you well. Are closed at the toe. Do not wear sandals. If you use a stepladder: Make sure that it is fully opened. Do not climb a closed stepladder. Make sure that both sides of the stepladder are locked into place. Ask someone to hold it for you, if possible. Clearly mark and make sure that you can  see: Any grab bars or handrails. First and last steps. Where the edge of each step is. Use tools that help you move around (mobility aids) if they are needed. These include: Canes. Walkers. Scooters. Crutches. Turn on the lights when you go into a dark area. Replace any light bulbs as soon as they burn out. Set up your furniture so you have a clear path. Avoid moving your furniture around. If any of your floors are uneven, fix them. If there are any pets around you, be aware of where they are. Review your medicines with your doctor. Some medicines can make you feel dizzy. This can increase your chance of falling. Ask your doctor what other things that you can do to help prevent falls. This information is not intended to replace advice given to you by your health care provider. Make sure you discuss any questions you have with your health care provider. Document Released: 10/09/2009 Document Revised: 05/20/2016 Document Reviewed: 01/17/2015 Elsevier Interactive Patient Education  2017 [employer].

## 2021-08-17 ENCOUNTER — Encounter: Payer: Self-pay | Admitting: Family Medicine

## 2021-08-17 ENCOUNTER — Ambulatory Visit: Payer: Medicare PPO | Admitting: Family Medicine

## 2021-08-17 ENCOUNTER — Other Ambulatory Visit: Payer: Self-pay

## 2021-08-17 VITALS — BP 120/82 | HR 87 | Temp 98.8°F | Resp 18 | Ht 65.0 in | Wt 202.4 lb

## 2021-08-17 DIAGNOSIS — H66002 Acute suppurative otitis media without spontaneous rupture of ear drum, left ear: Secondary | ICD-10-CM

## 2021-08-17 DIAGNOSIS — H8110 Benign paroxysmal vertigo, unspecified ear: Secondary | ICD-10-CM

## 2021-08-17 DIAGNOSIS — H6123 Impacted cerumen, bilateral: Secondary | ICD-10-CM | POA: Diagnosis not present

## 2021-08-17 MED ORDER — MECLIZINE HCL 12.5 MG PO TABS: 12.5000 mg | ORAL_TABLET | Freq: Three times a day (TID) | ORAL | 0 refills | Status: DC | PRN

## 2021-08-17 MED ORDER — CEFDINIR 300 MG PO CAPS: 300.0000 mg | ORAL_CAPSULE | Freq: Two times a day (BID) | ORAL | 0 refills | Status: DC

## 2021-08-17 NOTE — Progress Notes (Signed)
Subjective:   By signing my name below, I, Tracy Thompson, attest that this documentation has been prepared under the direction and in the presence of Dr. Roma Schanz, DO. 08/17/2021    Patient ID: Tracy Thompson, female    DOB: 1951/12/04, 70 y.o.   MRN: [id number]  Chief Complaint  Patient presents with   Cerumen Impaction    HPI Patient is in today for a office visit. She complains of clogged right and left ears. She has tried debrox earwax cleaner and found that her symptoms worsened and developed clogged right ear.  She reports having increased dizziness since her ear became clogged. She is requesting a refill for 12.5 mg Antivert 2-3x PRN.  Past Medical History:  Diagnosis Date   Breast cancer ([place]) 1994   s/p chemo 8/94-1/95, radiation 8/94-11/94   Cataracts, bilateral    Factor 5 Leiden mutation, heterozygous ([place])    also has Factor 8 problems   Hypertension    Menopause    chemo induced    Past Surgical History:  Procedure Laterality Date   BREAST LUMPECTOMY Right 1994   w/ radiation and chemotherapy following lumpectomy   CHOLECYSTECTOMY  06/20/2002   Laparoscopic cholestectomy   varicose veins Bilateral 2013   laser surgery   Dr. Jones Skene    Family History  Problem Relation Age of Onset   Cancer Mother        Bladder CA, colon   Colon cancer Mother    Stroke Mother    Hypertension Mother    Hyperlipidemia Mother    Hepatitis Mother        hep A   COPD Mother    Dementia Mother    Hypertension Father    Prostate cancer Father    Diabetes Father    Stroke Father    Pulmonary embolism Father    Deep vein thrombosis Father    Cancer Father        prostate cancer   Schizophrenia Brother    Mental illness Brother    COPD Brother    Hepatitis C Brother    Liver disease Other     Social History   Socioeconomic History   Marital status: Married    Spouse name: Not on file   Number of children: Not on file   Years of education: Not on  file   Highest education level: Not on file  Occupational History   Occupation: retired Product manager: RETIRED  Tobacco Use   Smoking status: Never   Smokeless tobacco: Never  Substance and Sexual Activity   Alcohol use: No   Drug use: No   Sexual activity: Yes    Partners: Male  Other Topics Concern   Not on file  Social History Narrative   Exercise--- walking   Social Determinants of Health   Financial Resource Strain: Low Risk    Difficulty of Paying Living Expenses: Not hard at all  Food Insecurity: Not on file  Transportation Needs: No Transportation Needs   Lack of Transportation (Medical): No   Lack of Transportation (Non-Medical): No  Physical Activity: Sufficiently Active   Days of Exercise per Week: 7 days   Minutes of Exercise per Session: 30 min  Stress: No Stress Concern Present   Feeling of Stress : Not at all  Social Connections: Moderately Isolated   Frequency of Communication with Friends and Family: More than three times a week   Frequency of Social Gatherings with Friends and Family:  More than three times a week   Attends Religious Services: Never   Active Member of Clubs or Organizations: No   Attends Archivist Meetings: Never   Marital Status: Married  Human resources officer Violence: Not At Risk   Fear of Current or Ex-Partner: No   Emotionally Abused: No   Physically Abused: No   Sexually Abused: No    Outpatient Medications Prior to Visit  Medication Sig Dispense Refill   amLODipine (NORVASC) 2.5 MG tablet Take 1 tablet (2.5 mg total) by mouth daily. 90 tablet 1   aspirin 81 MG tablet Take 81 mg by mouth daily.     atorvastatin (LIPITOR) 10 MG tablet Take 1 tablet (10 mg total) by mouth daily. 90 tablet 1   bisoprolol-hydrochlorothiazide (ZIAC) 10-6.25 MG tablet Take 1 tablet by mouth daily. 90 tablet 1   cholecalciferol (VITAMIN D) 1000 units tablet Take 2,000 Units by mouth daily.     Coenzyme Q10 200 MG capsule Take 200 mg by  mouth daily.     hydrochlorothiazide (HYDRODIURIL) 25 MG tablet 1 po qd 90 tablet 1   potassium chloride SA (KLOR-CON) 20 MEQ tablet Take 1 tablet (20 mEq total) by mouth daily. 90 tablet 1   meclizine (ANTIVERT) 12.5 MG tablet Take 1 tablet (12.5 mg total) by mouth 3 (three) times daily as needed for dizziness. 30 tablet 0   No facility-administered medications prior to visit.    No Known Allergies  Review of Systems  Constitutional:  Negative for fever and malaise/fatigue.  HENT:  Negative for congestion.   Eyes:  Negative for blurred vision.  Respiratory:  Negative for shortness of breath.   Cardiovascular:  Negative for chest pain, palpitations and leg swelling.  Gastrointestinal:  Negative for abdominal pain, blood in stool and nausea.  Genitourinary:  Negative for dysuria and frequency.  Musculoskeletal:  Negative for falls.  Skin:  Negative for rash.  Neurological:  Negative for dizziness, loss of consciousness and headaches.  Endo/Heme/Allergies:  Negative for environmental allergies.  Psychiatric/Behavioral:  Negative for depression. The patient is not nervous/anxious.       Objective:    Physical Exam Vitals and nursing note reviewed.  Constitutional:      General: She is not in acute distress.    Appearance: Normal appearance. She is not ill-appearing.  HENT:     Head: Normocephalic and atraumatic.     Right Ear: External ear normal. There is impacted cerumen.     Left Ear: External ear normal. There is impacted cerumen. Tympanic membrane is erythematous and retracted.     Ears:     Comments: Left ear drum is infected, retracted, erythematous, and dull   Unable to remove wax with hoop----  irrigated successfully after pt consent and pt tolerated procedure well  Eyes:     Extraocular Movements: Extraocular movements intact.     Pupils: Pupils are equal, round, and reactive to light.  Cardiovascular:     Rate and Rhythm: Normal rate and regular rhythm.     Heart  sounds: Normal heart sounds. No murmur heard.   No gallop.  Pulmonary:     Effort: Pulmonary effort is normal. No respiratory distress.     Breath sounds: Normal breath sounds. No wheezing or rales.  Skin:    General: Skin is warm and dry.  Neurological:     Mental Status: She is alert and oriented to person, place, and time.  Psychiatric:        Behavior:  Behavior normal.        Judgment: Judgment normal.    BP 120/82 (BP Location: Left Arm, Patient Position: Sitting, Cuff Size: Normal)   Pulse 87   Temp 98.8 F (37.1 C) (Oral)   Resp 18   Ht 5\' 5"  (1.651 m)   Wt 202 lb 6.4 oz (91.8 kg)   SpO2 95%   BMI 33.68 kg/m  Wt Readings from Last 3 Encounters:  08/17/21 202 lb 6.4 oz (91.8 kg)  07/20/21 199 lb (90.3 kg)  03/20/21 199 lb 6.4 oz (90.4 kg)    Diabetic Foot Exam - Simple   No data filed    Lab Results  Component Value Date   WBC 6.7 07/08/2020   HGB 14.6 07/08/2020   HCT 43.5 07/08/2020   PLT 304.0 07/08/2020   GLUCOSE 106 (H) 03/20/2021   CHOL 128 03/20/2021   TRIG 80.0 03/20/2021   HDL 51.70 03/20/2021   LDLDIRECT 135.7 02/11/2012   LDLCALC 60 03/20/2021   ALT 26 03/20/2021   AST 23 03/20/2021   NA 139 03/20/2021   K 3.8 03/20/2021   CL 101 03/20/2021   CREATININE 0.98 03/20/2021   BUN 13 03/20/2021   CO2 29 03/20/2021   TSH 3.69 06/10/2017   HGBA1C 6.4 03/20/2021   MICROALBUR <0.7 05/31/2016    Lab Results  Component Value Date   TSH 3.69 06/10/2017   Lab Results  Component Value Date   WBC 6.7 07/08/2020   HGB 14.6 07/08/2020   HCT 43.5 07/08/2020   MCV 90.8 07/08/2020   PLT 304.0 07/08/2020   Lab Results  Component Value Date   NA 139 03/20/2021   K 3.8 03/20/2021   CO2 29 03/20/2021   GLUCOSE 106 (H) 03/20/2021   BUN 13 03/20/2021   CREATININE 0.98 03/20/2021   BILITOT 0.7 03/20/2021   ALKPHOS 80 03/20/2021   AST 23 03/20/2021   ALT 26 03/20/2021   PROT 7.3 03/20/2021   ALBUMIN 4.4 03/20/2021   CALCIUM 9.4 03/20/2021    ANIONGAP 14 05/05/2016   GFR 59.30 (L) 03/20/2021   Lab Results  Component Value Date   CHOL 128 03/20/2021   Lab Results  Component Value Date   HDL 51.70 03/20/2021   Lab Results  Component Value Date   LDLCALC 60 03/20/2021   Lab Results  Component Value Date   TRIG 80.0 03/20/2021   Lab Results  Component Value Date   CHOLHDL 2 03/20/2021   Lab Results  Component Value Date   HGBA1C 6.4 03/20/2021       Assessment & Plan:   Problem List Items Addressed This Visit       Unprioritized   BENIGN POSITIONAL VERTIGO   Relevant Medications   meclizine (ANTIVERT) 12.5 MG tablet   Bilateral impacted cerumen    Ears irrigated with no complications       Other Visit Diagnoses     Non-recurrent acute suppurative otitis media of left ear without spontaneous rupture of tympanic membrane    -  Primary   Relevant Medications   cefdinir (OMNICEF) 300 MG capsule        Meds ordered this encounter  Medications   meclizine (ANTIVERT) 12.5 MG tablet    Sig: Take 1 tablet (12.5 mg total) by mouth 3 (three) times daily as needed for dizziness.    Dispense:  30 tablet    Refill:  0   cefdinir (OMNICEF) 300 MG capsule    Sig: Take 1 capsule (  300 mg total) by mouth 2 (two) times daily.    Dispense:  10 capsule    Refill:  0    I, Dr. Roma Schanz, DO, personally preformed the services described in this documentation.  All medical record entries made by the scribe were at my direction and in my presence.  I have reviewed the chart and discharge instructions (if applicable) and agree that the record reflects my personal performance and is accurate and complete. 08/17/2021   I,Tracy Thompson,acting as a scribe for [name], DO.,have documented all relevant documentation on the behalf of [name], DO,as directed by  [name], DO while in the presence of [name], DO.   [name], DO

## 2021-08-17 NOTE — Assessment & Plan Note (Signed)
Ears irrigated with no complications

## 2021-08-17 NOTE — Patient Instructions (Signed)
Otitis Media, Adult  Otitis media occurs when there is inflammation and fluid in the middle ear space with signs and symptoms of an acute infection. The middle ear is a part of the ear that contains bones for hearing as well as air that helps send sounds to the brain. When infected fluid builds up in this space, it causes pressure and results in symptoms of acute otitis media. The eustachian tube connects the middle ear to the back of the nose (nasopharynx) and normally allows air into the middle ear space. If the eustachian tubebecomes blocked, fluid can build up and become infected. What are the causes? This condition is caused by a blockage in the eustachian tube. This can be caused by an object like mucus, or by swelling (edema) of the tube. Problems that can cause a blockage include: A cold or other upper respiratory infection. Allergies. An irritant, such as tobacco smoke. Enlarged adenoids. The adenoids are areas of soft tissue located high in the back of the throat, behind the nose and the roof of the mouth. They are part of the body's defense system (immune system). A mass in the nasopharynx. Damage to the ear caused by pressure changes (barotrauma). What are the signs or symptoms? Symptoms of this condition include: Ear pain. Fever. Decreased hearing. Tiredness (lethargy). Fluid leaking from the ear, if the eardrum is ruptured or has burst. Ringing in the ear. How is this diagnosed?  This condition is diagnosed with a physical exam. During the exam, your health care provider will use an instrument called an otoscope to look in your ear and check for redness, swelling, and fluid. He or she will also ask about yoursymptoms. Your health care provider may also order tests, such as: A pneumatic otoscopy. This is a test to check the movement of the eardrum. It is done by squeezing a small amount of air into the ear. A tympanogram is a test that shows how well the eardrum moves in response  to air pressure in the ear canal. It provides a graph for your health care provider to review. How is this treated? This condition can go away on its own within 3-5 days. But if the condition is caused by a bacterial infection and does not go away on its own, or if it keeps coming back, your health care provider may: Prescribe antibiotic medicine to treat the infection. Prescribe or recommend medicines to control pain. Follow these instructions at home: Take over-the-counter and prescription medicines only as told by your health care provider. If you were prescribed an antibiotic medicine, take it as told by your health care provider. Do not stop taking the antibiotic even if you start to feel better. Keep all follow-up visits as told by your health care provider. This is important. Contact a health care provider if: You have bleeding from your nose. There is a lump on your neck. You are not feeling better in 5 days. You feel worse instead of better. Get help right away if: You have severe pain that is not controlled with medicine. You have swelling, redness, or pain around your ear. You have stiffness in your neck. A part of your face is not moving (paralyzed). The bone behind your ear (mastoid) is tender when you touch it. You develop a severe headache. Summary Otitis media is redness, soreness, and swelling of the middle ear, usually resulting in pain. This condition can go away on its own within 3-5 days. If the problem does not go  away in 3-5 days, your health care provider may prescribe or recommend medicines to treat the infection or your symptoms. If you were prescribed an antibiotic medicine, take it as told by your health care provider. Follow all instructions you were given by your health care provider. This information is not intended to replace advice given to you by your health care provider. Make sure you discuss any questions you have with your healthcare  provider. Document Revised: 11/15/2019 Document Reviewed: 11/15/2019 Elsevier Patient Education  2022 [employer].

## 2021-08-24 DIAGNOSIS — Z1231 Encounter for screening mammogram for malignant neoplasm of breast: Secondary | ICD-10-CM | POA: Diagnosis not present

## 2021-08-24 LAB — HM MAMMOGRAPHY

## 2021-09-15 ENCOUNTER — Other Ambulatory Visit: Payer: Self-pay

## 2021-09-15 ENCOUNTER — Encounter: Payer: Self-pay | Admitting: Family Medicine

## 2021-09-15 ENCOUNTER — Ambulatory Visit (INDEPENDENT_AMBULATORY_CARE_PROVIDER_SITE_OTHER): Payer: Medicare PPO | Admitting: Family Medicine

## 2021-09-15 VITALS — BP 140/80 | HR 83 | Temp 98.1°F | Resp 18 | Ht 66.0 in | Wt 197.8 lb

## 2021-09-15 DIAGNOSIS — I890 Lymphedema, not elsewhere classified: Secondary | ICD-10-CM | POA: Diagnosis not present

## 2021-09-15 DIAGNOSIS — R739 Hyperglycemia, unspecified: Secondary | ICD-10-CM

## 2021-09-15 DIAGNOSIS — E785 Hyperlipidemia, unspecified: Secondary | ICD-10-CM

## 2021-09-15 DIAGNOSIS — D6851 Activated protein C resistance: Secondary | ICD-10-CM | POA: Diagnosis not present

## 2021-09-15 DIAGNOSIS — E559 Vitamin D deficiency, unspecified: Secondary | ICD-10-CM

## 2021-09-15 DIAGNOSIS — I10 Essential (primary) hypertension: Secondary | ICD-10-CM

## 2021-09-15 DIAGNOSIS — Z0000 Encounter for general adult medical examination without abnormal findings: Secondary | ICD-10-CM | POA: Diagnosis not present

## 2021-09-15 NOTE — Progress Notes (Signed)
Subjective:   By signing my name below, I, Tracy Thompson, attest that this documentation has been prepared under the direction and in the presence of Tracy Held, DO  09/15/2021    Patient ID: Tracy Thompson, female    DOB: [birth year], 70 y.o.   MRN: [id number]  Chief Complaint  Patient presents with   Annual Exam    Pt states fasting     HPI Patient is in today for a comprehensive physical exam. She continues taking vitamin D supplements daily PO and is interested in checked her vitamin D levels in her lab work during this visit.  She denies having any fever, ear pain, muscle pain, joint pain, new moles, congestion, sinus pain, sore throat, eye pain, chest pain, palpations, cough, SOB, wheezing, n/v/d, constipation, blood in stool, dysuria, frequency, hematuria, or headaches at this time. She has no recent changes in her family medical history. She has no recent surgical procedures this past year. She continues walking 2 miles every day. She is not interested in getting a flu vaccine during this visit. She is planning to get it mid October with her husband. She is also planning to get the new Covid-19 booster vaccine at a later date with her husband.  She is UTD on vision care. She is UTD on dental care.    Past Medical History:  Diagnosis Date   Breast cancer ([place]) 1994   s/p chemo 8/94-1/95, radiation 8/94-11/94   Cataracts, bilateral    Factor 5 Leiden mutation, heterozygous ([place])    also has Factor 8 problems   Hypertension    Menopause    chemo induced    Past Surgical History:  Procedure Laterality Date   BREAST LUMPECTOMY Right 1994   w/ radiation and chemotherapy following lumpectomy   CHOLECYSTECTOMY  06/20/2002   Laparoscopic cholestectomy   varicose veins Bilateral 2013   laser surgery   Dr. Jones Skene    Family History  Problem Relation Age of Onset   Cancer Mother        Bladder CA, colon   Colon cancer Mother    Stroke Mother    Hypertension  Mother    Hyperlipidemia Mother    Hepatitis Mother        hep A   COPD Mother    Dementia Mother    Hypertension Father    Prostate cancer Father    Diabetes Father    Stroke Father    Pulmonary embolism Father    Deep vein thrombosis Father    Cancer Father        prostate cancer   Schizophrenia Brother    Mental illness Brother    COPD Brother    Hepatitis C Brother    Liver disease Other     Social History   Socioeconomic History   Marital status: Married    Spouse name: Not on file   Number of children: Not on file   Years of education: Not on file   Highest education level: Not on file  Occupational History   Occupation: retired Product manager: RETIRED  Tobacco Use   Smoking status: Never   Smokeless tobacco: Never  Substance and Sexual Activity   Alcohol use: No   Drug use: No   Sexual activity: Yes    Partners: Male  Other Topics Concern   Not on file  Social History Narrative   Exercise--- walking   Social Determinants of Radio broadcast assistant  Strain: Low Risk    Difficulty of Paying Living Expenses: Not hard at all  Food Insecurity: Not on file  Transportation Needs: No Transportation Needs   Lack of Transportation (Medical): No   Lack of Transportation (Non-Medical): No  Physical Activity: Sufficiently Active   Days of Exercise per Week: 7 days   Minutes of Exercise per Session: 30 min  Stress: No Stress Concern Present   Feeling of Stress : Not at all  Social Connections: Moderately Isolated   Frequency of Communication with Friends and Family: More than three times a week   Frequency of Social Gatherings with Friends and Family: More than three times a week   Attends Religious Services: Never   Marine scientist or Organizations: No   Attends Music therapist: Never   Marital Status: Married  Human resources officer Violence: Not At Risk   Fear of Current or Ex-Partner: No   Emotionally Abused: No   Physically  Abused: No   Sexually Abused: No    Outpatient Medications Prior to Visit  Medication Sig Dispense Refill   amLODipine (NORVASC) 2.5 MG tablet Take 1 tablet (2.5 mg total) by mouth daily. 90 tablet 1   aspirin 81 MG tablet Take 81 mg by mouth daily.     atorvastatin (LIPITOR) 10 MG tablet Take 1 tablet (10 mg total) by mouth daily. 90 tablet 1   bisoprolol-hydrochlorothiazide (ZIAC) 10-6.25 MG tablet Take 1 tablet by mouth daily. 90 tablet 1   cholecalciferol (VITAMIN D) 1000 units tablet Take 2,000 Units by mouth daily.     Coenzyme Q10 200 MG capsule Take 200 mg by mouth daily.     hydrochlorothiazide (HYDRODIURIL) 25 MG tablet 1 po qd 90 tablet 1   meclizine (ANTIVERT) 12.5 MG tablet Take 1 tablet (12.5 mg total) by mouth 3 (three) times daily as needed for dizziness. 30 tablet 0   potassium chloride SA (KLOR-CON) 20 MEQ tablet Take 1 tablet (20 mEq total) by mouth daily. 90 tablet 1   cefdinir (OMNICEF) 300 MG capsule Take 1 capsule (300 mg total) by mouth 2 (two) times daily. 10 capsule 0   No facility-administered medications prior to visit.    No Known Allergies  Review of Systems  Constitutional:  Negative for fever.  HENT:  Negative for congestion, ear pain, sinus pain and sore throat.   Eyes:  Negative for pain.  Respiratory:  Negative for cough, shortness of breath and wheezing.   Cardiovascular:  Negative for chest pain and palpitations.  Gastrointestinal:  Negative for blood in stool, constipation, diarrhea, nausea and vomiting.  Genitourinary:  Negative for dysuria, frequency and hematuria.  Musculoskeletal:  Negative for joint pain and myalgias.  Skin:        (-)New moles  Neurological:  Negative for headaches.      Objective:    Physical Exam Constitutional:      General: She is not in acute distress.    Appearance: Normal appearance. She is not ill-appearing.  HENT:     Head: Normocephalic and atraumatic.     Right Ear: Tympanic membrane, ear canal and  external ear normal.     Left Ear: Tympanic membrane, ear canal and external ear normal.     Ears:     Comments: Cerumen noted in left TM Eyes:     Extraocular Movements: Extraocular movements intact.     Pupils: Pupils are equal, round, and reactive to light.  Cardiovascular:     Rate and  Rhythm: Normal rate and regular rhythm.     Heart sounds: Normal heart sounds. No murmur heard.   No gallop.  Pulmonary:     Effort: Pulmonary effort is normal. No respiratory distress.     Breath sounds: Normal breath sounds. No wheezing or rales.  Abdominal:     General: Bowel sounds are normal. There is no distension.     Palpations: Abdomen is soft.     Tenderness: There is no abdominal tenderness. There is no guarding.  Musculoskeletal:     Comments: Lymphedema in right arm  Skin:    General: Skin is warm and dry.  Neurological:     Mental Status: She is alert and oriented to person, place, and time.  Psychiatric:        Behavior: Behavior normal.    BP 140/80 (BP Location: Left Arm, Patient Position: Sitting, Cuff Size: Normal)   Pulse 83   Temp 98.1 F (36.7 C) (Oral)   Resp 18   Ht 5\' 6"  (1.676 m)   Wt 197 lb 12.8 oz (89.7 kg)   SpO2 97%   BMI 31.93 kg/m  Wt Readings from Last 3 Encounters:  09/15/21 197 lb 12.8 oz (89.7 kg)  08/17/21 202 lb 6.4 oz (91.8 kg)  07/20/21 199 lb (90.3 kg)    Diabetic Foot Exam - Simple   No data filed    Lab Results  Component Value Date   WBC 6.7 07/08/2020   HGB 14.6 07/08/2020   HCT 43.5 07/08/2020   PLT 304.0 07/08/2020   GLUCOSE 106 (H) 03/20/2021   CHOL 128 03/20/2021   TRIG 80.0 03/20/2021   HDL 51.70 03/20/2021   LDLDIRECT 135.7 02/11/2012   LDLCALC 60 03/20/2021   ALT 26 03/20/2021   AST 23 03/20/2021   NA 139 03/20/2021   K 3.8 03/20/2021   CL 101 03/20/2021   CREATININE 0.98 03/20/2021   BUN 13 03/20/2021   CO2 29 03/20/2021   TSH 3.69 06/10/2017   HGBA1C 6.4 03/20/2021   MICROALBUR <0.7 05/31/2016    Lab  Results  Component Value Date   TSH 3.69 06/10/2017   Lab Results  Component Value Date   WBC 6.7 07/08/2020   HGB 14.6 07/08/2020   HCT 43.5 07/08/2020   MCV 90.8 07/08/2020   PLT 304.0 07/08/2020   Lab Results  Component Value Date   NA 139 03/20/2021   K 3.8 03/20/2021   CO2 29 03/20/2021   GLUCOSE 106 (H) 03/20/2021   BUN 13 03/20/2021   CREATININE 0.98 03/20/2021   BILITOT 0.7 03/20/2021   ALKPHOS 80 03/20/2021   AST 23 03/20/2021   ALT 26 03/20/2021   PROT 7.3 03/20/2021   ALBUMIN 4.4 03/20/2021   CALCIUM 9.4 03/20/2021   ANIONGAP 14 05/05/2016   GFR 59.30 (L) 03/20/2021   Lab Results  Component Value Date   CHOL 128 03/20/2021   Lab Results  Component Value Date   HDL 51.70 03/20/2021   Lab Results  Component Value Date   LDLCALC 60 03/20/2021   Lab Results  Component Value Date   TRIG 80.0 03/20/2021   d  Lab Results  Component Value Date   HGBA1C 6.4 03/20/2021   Mammogram- Last completed 08/24/2021. Results are normal. Repeat in 1 year.   Pap smear- Last completed 04/22/2015. Results are normal.  Dexa- Last completed 06/24/2020. Results are normal. Repeat in 2 years.  Colonoscopy- Last completed 07/25/2012.      Assessment & Plan:  Problem List Items Addressed This Visit       Unprioritized   Factor 5 Leiden mutation, heterozygous ([place])   Morbid obesity due to excess calories ([place])   Hyperglycemia    Check labs      Relevant Orders   Hemoglobin A1c   Hyperlipidemia    Encourage heart healthy diet such as MIND or DASH diet, increase exercise, avoid trans fats, simple carbohydrates and processed foods, consider a krill or fish or flaxseed oil cap daily.       Relevant Orders   CBC with Differential/Platelet   Lipid panel   Comprehensive metabolic panel   Hypertension    Well controlled, no changes to meds. Encouraged heart healthy diet such as the DASH diet and exercise as tolerated.       Relevant Orders   CBC with  Differential/Platelet   Lipid panel   Comprehensive metabolic panel   Lymphedema    R upper ext      Preventative health care - Primary    ghm utd Check labs       Vitamin D deficiency   Relevant Orders   VITAMIN D 25 Hydroxy (Vit-D Deficiency, Fractures)     No orders of the defined types were placed in this encounter.   I, Tracy Held, DO, personally preformed the services described in this documentation.  All medical record entries made by the scribe were at my direction and in my presence.  I have reviewed the chart and discharge instructions (if applicable) and agree that the record reflects my personal performance and is accurate and complete. 09/15/2021   I,Tracy Thompson,acting as a scribe for Tracy Held, DO.,have documented all relevant documentation on the behalf of Tracy Held, DO,as directed by  Tracy Held, DO while in the presence of Tracy Held, DO.   Tracy Held, DO

## 2021-09-15 NOTE — Patient Instructions (Signed)
Preventive Care 69 Years and Older, Female Preventive care refers to lifestyle choices and visits with your health care provider that can promote health and wellness. This includes: A yearly physical exam. This is also called an annual wellness visit. Regular dental and eye exams. Immunizations. Screening for certain conditions. Healthy lifestyle choices, such as: Eating a healthy diet. Getting regular exercise. Not using drugs or products that contain nicotine and tobacco. Limiting alcohol use. What can I expect for my preventive care visit? Physical exam Your health care provider will check your: Height and weight. These may be used to calculate your BMI (body mass index). BMI is a measurement that tells if you are at a healthy weight. Heart rate and blood pressure. Body temperature. Skin for abnormal spots. Counseling Your health care provider may ask you questions about your: Past medical problems. Family's medical history. Alcohol, tobacco, and drug use. Emotional well-being. Home life and relationship well-being. Sexual activity. Diet, exercise, and sleep habits. History of falls. Memory and ability to understand (cognition). Work and work Statistician. Pregnancy and menstrual history. Access to firearms. What immunizations do I need? Vaccines are usually given at various ages, according to a schedule. Your health care provider will recommend vaccines for you based on your age, medical history, and lifestyle or other factors, such as travel or where you work. What tests do I need? Blood tests Lipid and cholesterol levels. These may be checked every 5 years, or more often depending on your overall health. Hepatitis C test. Hepatitis B test. Screening Lung cancer screening. You may have this screening every year starting at age 69 if you have a 30-pack-year history of smoking and currently smoke or have quit within the past 15 years. Colorectal cancer screening. All  adults should have this screening starting at age 69 and continuing until age 9. Your health care provider may recommend screening at age 69 if you are at increased risk. You will have tests every 1-10 years, depending on your results and the type of screening test. Diabetes screening. This is done by checking your blood sugar (glucose) after you have not eaten for a while (fasting). You may have this done every 1-3 years. Mammogram. This may be done every 1-2 years. Talk with your health care provider about how often you should have regular mammograms. Abdominal aortic aneurysm (AAA) screening. You may need this if you are a current or former smoker. BRCA-related cancer screening. This may be done if you have a family history of breast, ovarian, tubal, or peritoneal cancers. Other tests STD (sexually transmitted disease) testing, if you are at risk. Bone density scan. This is done to screen for osteoporosis. You may have this done starting at age 43. Talk with your health care provider about your test results, treatment options, and if necessary, the need for more tests. Follow these instructions at home: Eating and drinking  Eat a diet that includes fresh fruits and vegetables, whole grains, lean protein, and low-fat dairy products. Limit your intake of foods with high amounts of sugar, saturated fats, and salt. Take vitamin and mineral supplements as recommended by your health care provider. Do not drink alcohol if your health care provider tells you not to drink. If you drink alcohol: Limit how much you have to 0-1 drink a day. Be aware of how much alcohol is in your drink. In the [place] one drink equals one 12 oz bottle of beer (355 mL), one 5 oz glass of wine (148 mL), or one  1 oz glass of hard liquor (44 mL). Lifestyle Take daily care of your teeth and gums. Brush your teeth every morning and night with fluoride toothpaste. Floss one time each day. Stay active. Exercise for at least  30 minutes 5 or more days each week. Do not use any products that contain nicotine or tobacco, such as cigarettes, e-cigarettes, and chewing tobacco. If you need help quitting, ask your health care provider. Do not use drugs. If you are sexually active, practice safe sex. Use a condom or other form of protection in order to prevent STIs (sexually transmitted infections). Talk with your health care provider about taking a low-dose aspirin or statin. Find healthy ways to cope with stress, such as: Meditation, yoga, or listening to music. Journaling. Talking to a trusted person. Spending time with friends and family. Safety Always wear your seat belt while driving or riding in a vehicle. Do not drive: If you have been drinking alcohol. Do not ride with someone who has been drinking. When you are tired or distracted. While texting. Wear a helmet and other protective equipment during sports activities. If you have firearms in your house, make sure you follow all gun safety procedures. What's next? Visit your health care provider once a year for an annual wellness visit. Ask your health care provider how often you should have your eyes and teeth checked. Stay up to date on all vaccines. This information is not intended to replace advice given to you by your health care provider. Make sure you discuss any questions you have with your health care provider. Document Revised: 02/20/2021 Document Reviewed: 12/07/2018 Elsevier Patient Education  2022 [employer].

## 2021-09-15 NOTE — Assessment & Plan Note (Signed)
R upper ext

## 2021-09-15 NOTE — Assessment & Plan Note (Signed)
Encourage heart healthy diet such as MIND or DASH diet, increase exercise, avoid trans fats, simple carbohydrates and processed foods, consider a krill or fish or flaxseed oil cap daily.  °

## 2021-09-15 NOTE — Assessment & Plan Note (Signed)
Check labs 

## 2021-09-15 NOTE — Assessment & Plan Note (Signed)
ghm utd Check labs  

## 2021-09-15 NOTE — Assessment & Plan Note (Signed)
Well controlled, no changes to meds. Encouraged heart healthy diet such as the DASH diet and exercise as tolerated.  °

## 2021-09-16 LAB — LIPID PANEL
Cholesterol: 122 mg/dL (ref 0–200)
HDL: 54 mg/dL (ref >39.00)
LDL Cholesterol: 53 mg/dL (ref 0–99)
NonHDL: 68.14
Total CHOL/HDL Ratio: 2
Triglycerides: 74 mg/dL (ref 0.0–149.0)
VLDL: 14.8 mg/dL (ref 0.0–40.0)

## 2021-09-16 LAB — COMPREHENSIVE METABOLIC PANEL
ALT: 28 U/L (ref 0–35)
AST: 28 U/L (ref 0–37)
Albumin: 4.3 g/dL (ref 3.5–5.2)
Alkaline Phosphatase: 84 U/L (ref 39–117)
BUN: 14 mg/dL (ref 6–23)
CO2: 29 mEq/L (ref 19–32)
Calcium: 9.7 mg/dL (ref 8.4–10.5)
Chloride: 99 mEq/L (ref 96–112)
Creatinine, Ser: 0.99 mg/dL (ref 0.40–1.20)
GFR: 58.38 mL/min — ABNORMAL LOW (ref >60.00)
Glucose, Bld: 90 mg/dL (ref 70–99)
Potassium: 3.8 mEq/L (ref 3.5–5.1)
Sodium: 138 mEq/L (ref 135–145)
Total Bilirubin: 0.8 mg/dL (ref 0.2–1.2)
Total Protein: 7.5 g/dL (ref 6.0–8.3)

## 2021-09-16 LAB — CBC WITH DIFFERENTIAL/PLATELET
Basophils Absolute: 0.1 10*3/uL (ref 0.0–0.1)
Basophils Relative: 1 % (ref 0.0–3.0)
Eosinophils Absolute: 0.1 10*3/uL (ref 0.0–0.7)
Eosinophils Relative: 1.8 % (ref 0.0–5.0)
HCT: 43.7 % (ref 36.0–46.0)
Hemoglobin: 14.6 g/dL (ref 12.0–15.0)
Lymphocytes Relative: 28.3 % (ref 12.0–46.0)
Lymphs Abs: 1.8 10*3/uL (ref 0.7–4.0)
MCHC: 33.3 g/dL (ref 30.0–36.0)
MCV: 90 fl (ref 78.0–100.0)
Monocytes Absolute: 0.6 10*3/uL (ref 0.1–1.0)
Monocytes Relative: 9.6 % (ref 3.0–12.0)
Neutro Abs: 3.7 10*3/uL (ref 1.4–7.7)
Neutrophils Relative %: 59.3 % (ref 43.0–77.0)
Platelets: 328 10*3/uL (ref 150.0–400.0)
RBC: 4.86 Mil/uL (ref 3.87–5.11)
RDW: 14.4 % (ref 11.5–15.5)
WBC: 6.3 10*3/uL (ref 4.0–10.5)

## 2021-09-16 LAB — VITAMIN D 25 HYDROXY (VIT D DEFICIENCY, FRACTURES): VITD: 49.96 ng/mL (ref 30.00–100.00)

## 2021-09-16 LAB — HEMOGLOBIN A1C: Hgb A1c MFr Bld: 6.4 % (ref 4.6–6.5)

## 2021-10-02 ENCOUNTER — Other Ambulatory Visit: Payer: Self-pay

## 2021-10-02 ENCOUNTER — Ambulatory Visit (INDEPENDENT_AMBULATORY_CARE_PROVIDER_SITE_OTHER): Payer: Medicare PPO

## 2021-10-02 ENCOUNTER — Encounter: Payer: Self-pay | Admitting: Family Medicine

## 2021-10-02 DIAGNOSIS — Z23 Encounter for immunization: Secondary | ICD-10-CM | POA: Diagnosis not present

## 2021-10-13 ENCOUNTER — Other Ambulatory Visit: Payer: Self-pay | Admitting: Family Medicine

## 2021-10-19 ENCOUNTER — Telehealth: Payer: Self-pay | Admitting: Family Medicine

## 2021-10-19 MED ORDER — BISOPROLOL-HYDROCHLOROTHIAZIDE 10-6.25 MG PO TABS: 1.0000 | ORAL_TABLET | Freq: Every day | ORAL | 1 refills | Status: DC

## 2021-10-19 NOTE — Telephone Encounter (Signed)
Refill sent.

## 2021-10-19 NOTE — Telephone Encounter (Signed)
Medication: bisoprolol-hydrochlorothiazide (ZIAC) 10-6.25 MG tablet  Has the patient contacted their pharmacy? Yes.   (If no, request that the patient contact the pharmacy for the refill.) (If yes, when and what did the pharmacy advise?)  Preferred Pharmacy (with phone number or street name): [facility] [place] [address] [facility]  [place] [address] [address]  Phone:  [phone]  Fax:  [phone]  Agent: Please be advised that RX refills may take up to 3 business days. We ask that you follow-up with your pharmacy.

## 2021-10-23 ENCOUNTER — Ambulatory Visit: Payer: Medicare PPO | Attending: Internal Medicine

## 2021-10-23 DIAGNOSIS — Z23 Encounter for immunization: Secondary | ICD-10-CM

## 2021-10-23 NOTE — Progress Notes (Signed)
   Covid-19 Vaccination Clinic  Name:  YARETSI HUMPHRES    MRN: [id number] DOB: [birth year]  10/23/2021  Ms. Walby was observed post Covid-19 immunization for 15 minutes without incident. She was provided with Vaccine Information Sheet and instruction to access the V-Safe system.   Ms. Delisi was instructed to call 911 with any severe reactions post vaccine: Difficulty breathing  Swelling of face and throat  A fast heartbeat  A bad rash all over body  Dizziness and weakness   Immunizations Administered     Name Date Dose VIS Date Route   Pfizer Covid-19 Vaccine Bivalent Booster 10/23/2021  9:55 AM 0.3 mL 08/26/2021 Intramuscular   Manufacturer: [place]   Lot: [place]: [id number]

## 2021-11-06 ENCOUNTER — Other Ambulatory Visit: Payer: Self-pay

## 2021-11-06 DIAGNOSIS — I10 Essential (primary) hypertension: Secondary | ICD-10-CM

## 2021-11-06 MED ORDER — HYDROCHLOROTHIAZIDE 25 MG PO TABS: ORAL_TABLET | ORAL | 1 refills | Status: DC

## 2021-11-16 ENCOUNTER — Other Ambulatory Visit (HOSPITAL_BASED_OUTPATIENT_CLINIC_OR_DEPARTMENT_OTHER): Payer: Self-pay

## 2021-11-16 MED ORDER — PFIZER COVID-19 VAC BIVALENT 30 MCG/0.3ML IM SUSP
INTRAMUSCULAR | 0 refills | Status: DC
  Filled 2021-11-16: qty 0.3, 1d supply, fill #0

## 2021-12-31 ENCOUNTER — Other Ambulatory Visit: Payer: Self-pay | Admitting: Family Medicine

## 2021-12-31 DIAGNOSIS — E785 Hyperlipidemia, unspecified: Secondary | ICD-10-CM

## 2022-02-05 ENCOUNTER — Other Ambulatory Visit: Payer: Self-pay | Admitting: Family Medicine

## 2022-02-05 DIAGNOSIS — I10 Essential (primary) hypertension: Secondary | ICD-10-CM

## 2022-03-22 ENCOUNTER — Encounter: Payer: Self-pay | Admitting: Family Medicine

## 2022-03-22 ENCOUNTER — Ambulatory Visit: Payer: Medicare PPO | Admitting: Family Medicine

## 2022-03-22 VITALS — BP 120/70 | HR 76 | Temp 98.2°F | Resp 16 | Ht 66.0 in | Wt 203.0 lb

## 2022-03-22 DIAGNOSIS — E785 Hyperlipidemia, unspecified: Secondary | ICD-10-CM | POA: Diagnosis not present

## 2022-03-22 DIAGNOSIS — E2839 Other primary ovarian failure: Secondary | ICD-10-CM | POA: Diagnosis not present

## 2022-03-22 DIAGNOSIS — I10 Essential (primary) hypertension: Secondary | ICD-10-CM | POA: Diagnosis not present

## 2022-03-22 LAB — LIPID PANEL
Cholesterol: 124 mg/dL (ref 0–200)
HDL: 55.2 mg/dL (ref >39.00)
LDL Cholesterol: 52 mg/dL (ref 0–99)
NonHDL: 68.73
Total CHOL/HDL Ratio: 2
Triglycerides: 82 mg/dL (ref 0.0–149.0)
VLDL: 16.4 mg/dL (ref 0.0–40.0)

## 2022-03-22 LAB — COMPREHENSIVE METABOLIC PANEL
ALT: 25 U/L (ref 0–35)
AST: 26 U/L (ref 0–37)
Albumin: 4.4 g/dL (ref 3.5–5.2)
Alkaline Phosphatase: 91 U/L (ref 39–117)
BUN: 14 mg/dL (ref 6–23)
CO2: 29 mEq/L (ref 19–32)
Calcium: 9.6 mg/dL (ref 8.4–10.5)
Chloride: 100 mEq/L (ref 96–112)
Creatinine, Ser: 0.95 mg/dL (ref 0.40–1.20)
GFR: 61.12 mL/min (ref >60.00)
Glucose, Bld: 101 mg/dL — ABNORMAL HIGH (ref 70–99)
Potassium: 3.7 mEq/L (ref 3.5–5.1)
Sodium: 138 mEq/L (ref 135–145)
Total Bilirubin: 0.6 mg/dL (ref 0.2–1.2)
Total Protein: 7.5 g/dL (ref 6.0–8.3)

## 2022-03-22 MED ORDER — ATORVASTATIN CALCIUM 10 MG PO TABS: ORAL_TABLET | ORAL | 1 refills | Status: DC

## 2022-03-22 MED ORDER — AMLODIPINE BESYLATE 2.5 MG PO TABS: ORAL_TABLET | ORAL | 1 refills | Status: DC

## 2022-03-22 MED ORDER — BISOPROLOL-HYDROCHLOROTHIAZIDE 10-6.25 MG PO TABS: 1.0000 | ORAL_TABLET | Freq: Every day | ORAL | 1 refills | Status: DC

## 2022-03-22 MED ORDER — HYDROCHLOROTHIAZIDE 25 MG PO TABS: ORAL_TABLET | ORAL | 1 refills | Status: DC

## 2022-03-22 MED ORDER — POTASSIUM CHLORIDE CRYS ER 20 MEQ PO TBCR: 20.0000 meq | EXTENDED_RELEASE_TABLET | Freq: Every day | ORAL | 1 refills | Status: DC

## 2022-03-22 NOTE — Assessment & Plan Note (Signed)
Well controlled, no changes to meds. Encouraged heart healthy diet such as the DASH diet and exercise as tolerated.  °

## 2022-03-22 NOTE — Assessment & Plan Note (Signed)
Encourage heart healthy diet such as MIND or DASH diet, increase exercise, avoid trans fats, simple carbohydrates and processed foods, consider a krill or fish or flaxseed oil cap daily.  °

## 2022-03-22 NOTE — Progress Notes (Signed)
? ?Subjective:  ? ?By signing my name below, I, Tracy Thompson, attest that this documentation has been prepared under the direction and in the presence of Tracy Thompson 03/22/2022 ?   ? ? Patient ID: Tracy Thompson, female    DOB: Feb 07, 1951, 71 y.o.   MRN: [id number] ? ?Chief Complaint  ?Patient presents with  ?? Follow-up  ?  Here for Follow Up   ? ? ?HPI ?Patient is in today for an office visit. ? ?She has small swelling on her left ankle. She wear compression socks to alleviate symptoms.  ? ?As of today's visit, her blood pressure is good.  ?BP Readings from Last 3 Encounters:  ?03/22/22 120/70  ?09/15/21 140/80  ?08/17/21 120/82  ? ?She is scheduled for a mammogram on 08/31/2022. ? ?Past Medical History:  ?Diagnosis Date  ?? Breast cancer ([place]) 1994  ? s/p chemo 8/94-1/95, radiation 8/94-11/94  ?? Cataracts, bilateral   ?? 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 also has Factor 8 problems  ?? Hypertension   ?? Menopause   ? chemo induced  ? ? ?Past Surgical History:  ?Procedure Laterality Date  ?? BREAST LUMPECTOMY Right 1994  ? w/ radiation and chemotherapy following lumpectomy  ?? CHOLECYSTECTOMY  06/20/2002  ? Laparoscopic cholestectomy  ?? varicose veins Bilateral 2013  ? laser surgery   Dr. Jones Skene  ? ? ?Family History  ?Problem Relation Age of Onset  ?? Cancer Mother   ?     Bladder CA, colon  ?? Colon cancer Mother   ?? Stroke Mother   ?? Hypertension Mother   ?? Hyperlipidemia Mother   ?? Hepatitis Mother   ?     hep A  ?? COPD Mother   ?? Dementia Mother   ?? Hypertension Father   ?? Prostate cancer Father   ?? Diabetes Father   ?? Stroke Father   ?? Pulmonary embolism Father   ?? Deep vein thrombosis Father   ?? Cancer Father   ?     prostate cancer  ?? Schizophrenia Brother   ?? Mental illness Brother   ?? COPD Brother   ?? Hepatitis C Brother   ?? Liver disease Other   ? ? ?Social History  ? ?Socioeconomic History  ?? Marital status: Married  ?  Spouse name: Not on file  ??  Number of children: Not on file  ?? Years of education: Not on file  ?? Highest education level: Not on file  ?Occupational History  ?? Occupation: retired Pharmacist, hospital  ?  Employer: RETIRED  ?Tobacco Use  ?? Smoking status: Never  ?? Smokeless tobacco: Never  ?Substance and Sexual Activity  ?? Alcohol use: No  ?? Drug use: No  ?? Sexual activity: Yes  ?  Partners: Male  ?Other Topics Concern  ?? Not on file  ?Social History Narrative  ? Exercise--- walking  ? ?Social Determinants of Health  ? ?Financial Resource Strain: Low Risk   ?? Difficulty of Paying Living Expenses: Not hard at all  ?Food Insecurity: Not on file  ?Transportation Needs: No Transportation Needs  ?? Lack of Transportation (Medical): No  ?? Lack of Transportation (Non-Medical): No  ?Physical Activity: Sufficiently Active  ?? Days of Exercise per Week: 7 days  ?? Minutes of Exercise per Session: 30 min  ?Stress: No Stress Concern Present  ?? Feeling of Stress : Not at all  ?Social Connections: Moderately Isolated  ?? Frequency of Communication with Friends and Family:  More than three times a week  ?? Frequency of Social Gatherings with Friends and Family: More than three times a week  ?? Attends Religious Services: Never  ?? Active Member of Clubs or Organizations: No  ?? Attends Archivist Meetings: Never  ?? Marital Status: Married  ?Intimate Partner Violence: Not At Risk  ?? Fear of Current or Ex-Partner: No  ?? Emotionally Abused: No  ?? Physically Abused: No  ?? Sexually Abused: No  ? ? ?Outpatient Medications Prior to Visit  ?Medication Sig Dispense Refill  ?? aspirin 81 MG tablet Take 81 mg by mouth daily.    ?? cholecalciferol (VITAMIN D) 1000 units tablet Take 2,000 Units by mouth daily.    ?? Coenzyme Q10 200 MG capsule Take 200 mg by mouth daily.    ?? COVID-19 mRNA bivalent vaccine, Pfizer, (PFIZER COVID-19 VAC BIVALENT) injection Inject into the muscle. 0.3 mL 0  ?? meclizine (ANTIVERT) 12.5 MG tablet Take 1 tablet (12.5 mg total)  by mouth 3 (three) times daily as needed for dizziness. 30 tablet 0  ?? amLODipine (NORVASC) 2.5 MG tablet TAKE 1 TABLET(2.5 MG) BY MOUTH DAILY 90 tablet 1  ?? atorvastatin (LIPITOR) 10 MG tablet TAKE 1 TABLET(10 MG) BY MOUTH DAILY 90 tablet 1  ?? bisoprolol-hydrochlorothiazide (ZIAC) 10-6.25 MG tablet Take 1 tablet by mouth daily. 90 tablet 1  ?? hydrochlorothiazide (HYDRODIURIL) 25 MG tablet TAKE 1 TABLET BY MOUTH EVERY DAY 90 tablet 1  ?? potassium chloride SA (KLOR-CON) 20 MEQ tablet Take 1 tablet (20 mEq total) by mouth daily. 90 tablet 1  ? ?No facility-administered medications prior to visit.  ? ? ?No Known Allergies ? ?Review of Systems  ?Constitutional:  Negative for fever and malaise/fatigue.  ?HENT:  Negative for congestion.   ?Eyes:  Negative for blurred vision.  ?Respiratory:  Negative for shortness of breath.   ?Cardiovascular:  Positive for leg swelling (Small swelling in left leg). Negative for chest pain and palpitations.  ?Gastrointestinal:  Negative for abdominal pain, blood in stool and nausea.  ?Genitourinary:  Negative for dysuria and frequency.  ?Musculoskeletal:  Negative for falls.  ?Skin:  Negative for rash.  ?Neurological:  Negative for dizziness, loss of consciousness and headaches.  ?Endo/Heme/Allergies:  Negative for environmental allergies.  ?Psychiatric/Behavioral:  Negative for depression. The patient is not nervous/anxious.   ? ?   ?Objective:  ?  ?Physical Exam ?Vitals and nursing note reviewed.  ?Constitutional:   ?   General: She is not in acute distress. ?   Appearance: Normal appearance. She is not ill-appearing.  ?HENT:  ?   Head: Normocephalic and atraumatic.  ?   Right Ear: External ear normal.  ?   Left Ear: External ear normal.  ?Eyes:  ?   Extraocular Movements: Extraocular movements intact.  ?   Pupils: Pupils are equal, round, and reactive to light.  ?Cardiovascular:  ?   Rate and Rhythm: Normal rate and regular rhythm.  ?   Heart sounds: Normal heart sounds. No murmur  heard. ?  No gallop.  ?Pulmonary:  ?   Effort: Pulmonary effort is normal. No respiratory distress.  ?   Breath sounds: Normal breath sounds. No wheezing or rales.  ?Skin: ?   General: Skin is warm and dry.  ?Neurological:  ?   Mental Status: She is alert and oriented to person, place, and time.  ?Psychiatric:     ?   Judgment: Judgment normal.  ? ? ?BP 120/70 (BP Location: Right  Arm, Patient Position: Sitting, Cuff Size: Normal)   Pulse 76   Temp 98.2 ?F (36.8 ?C) (Oral)   Resp 16   Ht 5\' 6"  (1.676 m)   Wt 203 lb (92.1 kg)   SpO2 95%   BMI 32.77 kg/m?  ?Wt Readings from Last 3 Encounters:  ?03/22/22 203 lb (92.1 kg)  ?09/15/21 197 lb 12.8 oz (89.7 kg)  ?08/17/21 202 lb 6.4 oz (91.8 kg)  ? ? ?Diabetic Foot Exam - Simple   ?No data filed ?  ? ?Lab Results  ?Component Value Date  ? WBC 6.3 09/15/2021  ? HGB 14.6 09/15/2021  ? HCT 43.7 09/15/2021  ? PLT 328.0 09/15/2021  ? GLUCOSE 90 09/15/2021  ? CHOL 122 09/15/2021  ? TRIG 74.0 09/15/2021  ? HDL 54.00 09/15/2021  ? LDLDIRECT 135.7 02/11/2012  ? [place] 53 09/15/2021  ? ALT 28 09/15/2021  ? AST 28 09/15/2021  ? NA 138 09/15/2021  ? K 3.8 09/15/2021  ? CL 99 09/15/2021  ? CREATININE 0.99 09/15/2021  ? BUN 14 09/15/2021  ? CO2 29 09/15/2021  ? TSH 3.69 06/10/2017  ? HGBA1C 6.4 09/15/2021  ? MICROALBUR <0.7 05/31/2016  ? ? ?Lab Results  ?Component Value Date  ? TSH 3.69 06/10/2017  ? ?Lab Results  ?Component Value Date  ? WBC 6.3 09/15/2021  ? HGB 14.6 09/15/2021  ? HCT 43.7 09/15/2021  ? MCV 90.0 09/15/2021  ? PLT 328.0 09/15/2021  ? ?Lab Results  ?Component Value Date  ? NA 138 09/15/2021  ? K 3.8 09/15/2021  ? CO2 29 09/15/2021  ? GLUCOSE 90 09/15/2021  ? BUN 14 09/15/2021  ? CREATININE 0.99 09/15/2021  ? BILITOT 0.8 09/15/2021  ? ALKPHOS 84 09/15/2021  ? AST 28 09/15/2021  ? ALT 28 09/15/2021  ? PROT 7.5 09/15/2021  ? ALBUMIN 4.3 09/15/2021  ? CALCIUM 9.7 09/15/2021  ? ANIONGAP 14 05/05/2016  ? GFR 58.38 (L) 09/15/2021  ? ?Lab Results  ?Component Value Date  ?  CHOL 122 09/15/2021  ? ?Lab Results  ?Component Value Date  ? HDL 54.00 09/15/2021  ? ?Lab Results  ?Component Value Date  ? [place] 53 09/15/2021  ? ?Lab Results  ?Component Value Date  ? TRIG 74.0 09/15/2021

## 2022-05-31 DIAGNOSIS — H524 Presbyopia: Secondary | ICD-10-CM | POA: Diagnosis not present

## 2022-05-31 DIAGNOSIS — H2513 Age-related nuclear cataract, bilateral: Secondary | ICD-10-CM | POA: Diagnosis not present

## 2022-07-22 ENCOUNTER — Ambulatory Visit: Payer: Medicare PPO

## 2022-07-27 NOTE — Progress Notes (Unsigned)
Subjective:   Tracy Thompson is a 71 y.o. female who presents for Medicare Annual (Subsequent) preventive examination.  Review of Systems           Objective:    There were no vitals filed for this visit. There is no height or weight on file to calculate BMI.     07/20/2021    9:04 AM 07/07/2020    8:09 AM 07/02/2019   10:08 AM 03/05/2019    9:45 AM 06/12/2018    1:40 PM 05/05/2016    7:54 AM  Advanced Directives  Does Patient Have a Medical Advance Directive? Yes Yes Yes Yes Yes Yes  Type of Paramedic of [place];Living will [place];Living will [place];Living will [place];Living will [place];Living will [place];Living will  Does patient want to make changes to medical advance directive?  No - Patient declined No - Patient declined No - Patient declined No - Patient declined   Copy of [place] in Chart? Yes - validated most recent copy scanned in chart (See row information) Yes - validated most recent copy scanned in chart (See row information) Yes - validated most recent copy scanned in chart (See row information) Yes - validated most recent copy scanned in chart (See row information) No - copy requested No - copy requested    Current Medications (verified) Outpatient Encounter Medications as of 07/28/2022  Medication Sig   amLODipine (NORVASC) 2.5 MG tablet TAKE 1 TABLET(2.5 MG) BY MOUTH DAILY   aspirin 81 MG tablet Take 81 mg by mouth daily.   atorvastatin (LIPITOR) 10 MG tablet TAKE 1 TABLET(10 MG) BY MOUTH DAILY   bisoprolol-hydrochlorothiazide (ZIAC) 10-6.25 MG tablet Take 1 tablet by mouth daily.   cholecalciferol (VITAMIN D) 1000 units tablet Take 2,000 Units by mouth daily.   Coenzyme Q10 200 MG capsule Take 200 mg by mouth daily.   COVID-19 mRNA bivalent vaccine, Pfizer, (PFIZER COVID-19 VAC BIVALENT) injection Inject into the  muscle.   hydrochlorothiazide (HYDRODIURIL) 25 MG tablet TAKE 1 TABLET BY MOUTH EVERY DAY   meclizine (ANTIVERT) 12.5 MG tablet Take 1 tablet (12.5 mg total) by mouth 3 (three) times daily as needed for dizziness.   potassium chloride SA (KLOR-CON M) 20 MEQ tablet Take 1 tablet (20 mEq total) by mouth daily.   No facility-administered encounter medications on file as of 07/28/2022.    Allergies (verified) Patient has no known allergies.   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 Jones Skene   Family History  Problem Relation Age of Onset   Cancer Mother        Bladder CA, colon   Colon cancer Mother    Stroke Mother    Hypertension Mother    Hyperlipidemia Mother    Hepatitis Mother        hep A   COPD Mother    Dementia Mother  Hypertension Father    Prostate cancer Father    Diabetes Father    Stroke Father    Pulmonary embolism Father    Deep vein thrombosis Father    Cancer Father        prostate cancer   Schizophrenia Brother    Mental illness Brother    COPD Brother    Hepatitis C Brother    Liver disease Other    Social History   Socioeconomic History   Marital status: Married    Spouse name: Not on file   Number of children: Not on file   Years of education: Not on file   Highest education level: Not on file  Occupational History   Occupation: retired Product manager: RETIRED  Tobacco Use   Smoking status: Never   Smokeless tobacco: Never  Substance and Sexual Activity    Alcohol use: No   Drug use: No   Sexual activity: Yes    Partners: Male  Other Topics Concern   Not on file  Social History Narrative   Exercise--- walking   Social Determinants of Health   Financial Resource Strain: Low Risk  (07/20/2021)   Overall Financial Resource Strain (CARDIA)    Difficulty of Paying Living Expenses: Not hard at all  Food Insecurity: No Food Insecurity (07/07/2020)   Hunger Vital Sign    Worried About Running Out of Food in the Last Year: Never true    [place] in the Last Year: Never true  Transportation Needs: No Transportation Needs (07/20/2021)   PRAPARE - Hydrologist (Medical): No    Lack of Transportation (Non-Medical): No  Physical Activity: Sufficiently Active (07/20/2021)   Exercise Vital Sign    Days of Exercise per Week: 7 days    Minutes of Exercise per Session: 30 min  Stress: No Stress Concern Present (07/20/2021)   [place]    Feeling of Stress : Not at all  Social Connections: Moderately Isolated (07/20/2021)   Social Connection and Isolation Panel [NHANES]    Frequency of Communication with Friends and Family: More than three times a week    Frequency of Social Gatherings with Friends and Family: More than three times a week    Attends Religious Services: Never    Marine scientist or Organizations: No    Attends Archivist Meetings: Never    Marital Status: Married    Tobacco Counseling Counseling given: Not Answered   Clinical Intake:                 Diabetic?no         Activities of Daily Living     No data to display          Patient Care Team: Carollee Herter, Alferd Apa, DO as PCP - General Elza Rafter, MD as Consulting Physician (Family Medicine) Dermatology, [facility] Jola Schmidt, MD as Consulting Physician (Ophthalmology)  Indicate any recent Medical Services you may have  received from other than [facility] providers in the past year (date may be approximate).     Assessment:   This is a routine wellness examination for [place].  Hearing/Vision screen No results found.  Dietary issues and exercise activities discussed:     Goals Addressed   None    Depression Screen    03/22/2022    9:45 AM 07/20/2021    9:06 AM 07/07/2020  8:13 AM 07/02/2019   10:09 AM 06/12/2018    1:13 PM 05/31/2016    8:26 AM 04/16/2013   10:41 AM  PHQ 2/9 Scores  PHQ - 2 Score 0 0 0 0 0 0 0    Fall Risk    03/22/2022    9:45 AM 07/20/2021    9:08 AM 07/07/2020    8:13 AM 07/02/2019   10:09 AM 06/12/2018    1:13 PM  Fall Risk   Falls in the past year? 0 0 0 0 No  Number falls in past yr: 0 0 0    Injury with Fall? 0 0 0    Follow up  Falls prevention discussed Education provided;Falls prevention discussed      FALL RISK PREVENTION PERTAINING TO THE HOME:  Any stairs in or around the home? {YES/NO:21197} If so, are there any without handrails? {YES/NO:21197} Home free of loose throw rugs in walkways, pet beds, electrical cords, etc? {YES/NO:21197} Adequate lighting in your home to reduce risk of falls? {YES/NO:21197}  ASSISTIVE DEVICES UTILIZED TO PREVENT FALLS:  Life alert? {YES/NO:21197} Use of a cane, walker or w/c? {YES/NO:21197} Grab bars in the bathroom? {YES/NO:21197} Shower chair or bench in shower? {YES/NO:21197} Elevated toilet seat or a handicapped toilet? {YES/NO:21197}  TIMED UP AND GO:  Was the test performed? {YES/NO:21197}.  Length of time to ambulate 10 feet: *** sec.   {Appearance of SNKN:3976734}  Cognitive Function:    06/12/2018    1:36 PM  MMSE - Mini Mental State Exam  Orientation to time 5  Orientation to Place 5  Registration 3  Attention/ Calculation 5  Recall 3  Language- name 2 objects 2  Language- repeat 1  Language- follow 3 step command 3  Language- read & follow direction 1  Write a sentence 1  Copy design 1  Total score 30         Immunizations Immunization History  Administered Date(s) Administered   Fluad Quad(high Dose 65+) 09/11/2019, 10/07/2020, 10/02/2021   Hep A / Hep B 01/26/2011, 03/30/2011, 07/29/2011   Influenza Split 09/20/2011, 10/17/2012   Influenza Whole 09/26/2008, 10/08/2009, 10/19/2010   Influenza, High Dose Seasonal PF 10/11/2017, 10/16/2018   Influenza,inj,Quad PF,6+ Mos 10/08/2013, 10/08/2014, 10/16/2015, 10/12/2016   PFIZER Comirnaty(Gray Top)Covid-19 Tri-Sucrose Vaccine 05/26/2021   PFIZER(Purple Top)SARS-COV-2 Vaccination 02/09/2020, 03/03/2020, 11/11/2020   Pfizer Covid-19 Vaccine Bivalent Booster 44yrs & up 10/23/2021   Pneumococcal Conjugate-13 06/12/2018   Pneumococcal Polysaccharide-23 04/22/2015, 03/20/2021   Td 07/02/2003   Tdap 04/16/2013   Zoster Recombinat (Shingrix) 06/10/2017, 08/23/2017   Zoster, Live 10/31/2012    TDAP status: Up to date  Flu Vaccine status: Up to date  Pneumococcal vaccine status: Up to date  Covid-19 vaccine status: Completed vaccines  Qualifies for Shingles Vaccine? Yes   Zostavax completed No   Shingrix Completed?: Yes  Screening Tests Health Maintenance  Topic Date Due   DEXA SCAN  06/24/2022   COLONOSCOPY (Pts 45-11yrs Insurance coverage will need to be confirmed)  07/25/2022   INFLUENZA VACCINE  07/27/2022   MAMMOGRAM  08/24/2022   TETANUS/TDAP  04/17/2023   Pneumonia Vaccine 20+ Years old  Completed   COVID-19 Vaccine  Completed   Hepatitis C Screening  Completed   Zoster Vaccines- Shingrix  Completed   HPV VACCINES  Aged Out    Health Maintenance  Health Maintenance Due  Topic Date Due   DEXA SCAN  06/24/2022   COLONOSCOPY (Pts 45-68yrs Insurance coverage will need to be confirmed)  07/25/2022  INFLUENZA VACCINE  07/27/2022    {Colorectal cancer screening:2101809}  Mammogram status: Completed 08/24/21. Repeat every year  Bone Density status: Ordered 03/22/22. Pt provided with contact info and advised to call  to schedule appt.  Lung Cancer Screening: (Low Dose CT Chest recommended if Age 14-80 years, 30 pack-year currently smoking OR have quit w/in 15years.) does not qualify.   Lung Cancer Screening Referral: n/a  Additional Screening:  Hepatitis C Screening: does qualify; Completed 01/26/11  Vision Screening: Recommended annual ophthalmology exams for early detection of glaucoma and other disorders of the eye. Is the patient up to date with their annual eye exam?  {YES/NO:21197} Who is the provider or what is the name of the office in which the patient attends annual eye exams? *** If pt is not established with a provider, would they like to be referred to a provider to establish care? {YES/NO:21197}.   Dental Screening: Recommended annual dental exams for proper oral hygiene  Community Resource Referral / Chronic Care Management: CRR required this visit?  {YES/NO:21197}  CCM required this visit?  {YES/NO:21197}     Plan:     I have personally reviewed and noted the following in the patient's chart:   Medical and social history Use of alcohol, tobacco or illicit drugs  Current medications and supplements including opioid prescriptions.  Functional ability and status Nutritional status Physical activity Advanced directives List of other physicians Hospitalizations, surgeries, and ER visits in previous 12 months Vitals Screenings to include cognitive, depression, and falls Referrals and appointments  In addition, I have reviewed and discussed with patient certain preventive protocols, quality metrics, and best practice recommendations. A written personalized care plan for preventive services as well as general preventive health recommendations were provided to patient.     Duard Brady Corisa Montini, [place]   07/27/2022   Nurse Notes: ***

## 2022-07-28 ENCOUNTER — Ambulatory Visit (INDEPENDENT_AMBULATORY_CARE_PROVIDER_SITE_OTHER): Payer: Medicare PPO

## 2022-07-28 DIAGNOSIS — Z0000 Encounter for general adult medical examination without abnormal findings: Secondary | ICD-10-CM

## 2022-07-28 NOTE — Patient Instructions (Signed)
Tracy Thompson , Thank you for taking time to come for your Medicare Wellness Visit. I appreciate your ongoing commitment to your health goals. Please review the following plan we discussed and let me know if I can assist you in the future.   Screening recommendations/referrals: Colonoscopy: declined Mammogram: 08/24/21 Bone Density: ordered 03/22/22 Recommended yearly ophthalmology/optometry visit for glaucoma screening and checkup Recommended yearly dental visit for hygiene and checkup  Vaccinations: Influenza vaccine: up to date Pneumococcal vaccine: up to date Tdap vaccine: up to date Shingles vaccine: up to date   Covid-19:completed  Advanced directives: yes, on file  Conditions/risks identified: see problem list  Next appointment: Follow up in one year for your annual wellness visit 08/02/23   Preventive Care 15 Years and Older, Female Preventive care refers to lifestyle choices and visits with your health care provider that can promote health and wellness. What does preventive care include? A yearly physical exam. This is also called an annual well check. Dental exams once or twice a year. Routine eye exams. Ask your health care provider how often you should have your eyes checked. Personal lifestyle choices, including: Daily care of your teeth and gums. Regular physical activity. Eating a healthy diet. Avoiding tobacco and drug use. Limiting alcohol use. Practicing safe sex. Taking low-dose aspirin every day. Taking vitamin and mineral supplements as recommended by your health care provider. What happens during an annual well check? The services and screenings done by your health care provider during your annual well check will depend on your age, overall health, lifestyle risk factors, and family history of disease. Counseling  Your health care provider may ask you questions about your: Alcohol use. Tobacco use. Drug use. Emotional well-being. Home and relationship  well-being. Sexual activity. Eating habits. History of falls. Memory and ability to understand (cognition). Work and work Statistician. Reproductive health. Screening  You may have the following tests or measurements: Height, weight, and BMI. Blood pressure. Lipid and cholesterol levels. These may be checked every 5 years, or more frequently if you are over 41 years old. Skin check. Lung cancer screening. You may have this screening every year starting at age 52 if you have a 30-pack-year history of smoking and currently smoke or have quit within the past 15 years. Fecal occult blood test (FOBT) of the stool. You may have this test every year starting at age 77. Flexible sigmoidoscopy or colonoscopy. You may have a sigmoidoscopy every 5 years or a colonoscopy every 10 years starting at age 40. Hepatitis C blood test. Hepatitis B blood test. Sexually transmitted disease (STD) testing. Diabetes screening. This is done by checking your blood sugar (glucose) after you have not eaten for a while (fasting). You may have this done every 1-3 years. Bone density scan. This is done to screen for osteoporosis. You may have this done starting at age 66. Mammogram. This may be done every 1-2 years. Talk to your health care provider about how often you should have regular mammograms. Talk with your health care provider about your test results, treatment options, and if necessary, the need for more tests. Vaccines  Your health care provider may recommend certain vaccines, such as: Influenza vaccine. This is recommended every year. Tetanus, diphtheria, and acellular pertussis (Tdap, Td) vaccine. You may need a Td booster every 10 years. Zoster vaccine. You may need this after age 72. Pneumococcal 13-valent conjugate (PCV13) vaccine. One dose is recommended after age 67. Pneumococcal polysaccharide (PPSV23) vaccine. One dose is recommended after age  65. Talk to your health care provider about which  screenings and vaccines you need and how often you need them. This information is not intended to replace advice given to you by your health care provider. Make sure you discuss any questions you have with your health care provider. Document Released: 01/09/2016 Document Revised: 09/01/2016 Document Reviewed: 10/14/2015 Elsevier Interactive Patient Education  2017 [place] Prevention in the Home Falls can cause injuries. They can happen to people of all ages. There are many things you can do to make your home safe and to help prevent falls. What can I do on the outside of my home? Regularly fix the edges of walkways and driveways and fix any cracks. Remove anything that might make you trip as you walk through a door, such as a raised step or threshold. Trim any bushes or trees on the path to your home. Use bright outdoor lighting. Clear any walking paths of anything that might make someone trip, such as rocks or tools. Regularly check to see if handrails are loose or broken. Make sure that both sides of any steps have handrails. Any raised decks and porches should have guardrails on the edges. Have any leaves, snow, or ice cleared regularly. Use sand or salt on walking paths during winter. Clean up any spills in your garage right away. This includes oil or grease spills. What can I do in the bathroom? Use night lights. Install grab bars by the toilet and in the tub and shower. Do not use towel bars as grab bars. Use non-skid mats or decals in the tub or shower. If you need to sit down in the shower, use a plastic, non-slip stool. Keep the floor dry. Clean up any water that spills on the floor as soon as it happens. Remove soap buildup in the tub or shower regularly. Attach bath mats securely with double-sided non-slip rug tape. Do not have throw rugs and other things on the floor that can make you trip. What can I do in the bedroom? Use night lights. Make sure that you have a  light by your bed that is easy to reach. Do not use any sheets or blankets that are too big for your bed. They should not hang down onto the floor. Have a firm chair that has side arms. You can use this for support while you get dressed. Do not have throw rugs and other things on the floor that can make you trip. What can I do in the kitchen? Clean up any spills right away. Avoid walking on wet floors. Keep items that you use a lot in easy-to-reach places. If you need to reach something above you, use a strong step stool that has a grab bar. Keep electrical cords out of the way. Do not use floor polish or wax that makes floors slippery. If you must use wax, use non-skid floor wax. Do not have throw rugs and other things on the floor that can make you trip. What can I do with my stairs? Do not leave any items on the stairs. Make sure that there are handrails on both sides of the stairs and use them. Fix handrails that are broken or loose. Make sure that handrails are as long as the stairways. Check any carpeting to make sure that it is firmly attached to the stairs. Fix any carpet that is loose or worn. Avoid having throw rugs at the top or bottom of the stairs. If you do have  throw rugs, attach them to the floor with carpet tape. Make sure that you have a light switch at the top of the stairs and the bottom of the stairs. If you do not have them, ask someone to add them for you. What else can I do to help prevent falls? Wear shoes that: Do not have high heels. Have rubber bottoms. Are comfortable and fit you well. Are closed at the toe. Do not wear sandals. If you use a stepladder: Make sure that it is fully opened. Do not climb a closed stepladder. Make sure that both sides of the stepladder are locked into place. Ask someone to hold it for you, if possible. Clearly mark and make sure that you can see: Any grab bars or handrails. First and last steps. Where the edge of each step  is. Use tools that help you move around (mobility aids) if they are needed. These include: Canes. Walkers. Scooters. Crutches. Turn on the lights when you go into a dark area. Replace any light bulbs as soon as they burn out. Set up your furniture so you have a clear path. Avoid moving your furniture around. If any of your floors are uneven, fix them. If there are any pets around you, be aware of where they are. Review your medicines with your doctor. Some medicines can make you feel dizzy. This can increase your chance of falling. Ask your doctor what other things that you can do to help prevent falls. This information is not intended to replace advice given to you by your health care provider. Make sure you discuss any questions you have with your health care provider. Document Released: 10/09/2009 Document Revised: 05/20/2016 Document Reviewed: 01/17/2015 Elsevier Interactive Patient Education  2017 [employer].

## 2022-08-09 DIAGNOSIS — L820 Inflamed seborrheic keratosis: Secondary | ICD-10-CM | POA: Diagnosis not present

## 2022-08-09 DIAGNOSIS — D2371 Other benign neoplasm of skin of right lower limb, including hip: Secondary | ICD-10-CM | POA: Diagnosis not present

## 2022-08-23 DIAGNOSIS — D2272 Melanocytic nevi of left lower limb, including hip: Secondary | ICD-10-CM | POA: Diagnosis not present

## 2022-08-23 DIAGNOSIS — D2261 Melanocytic nevi of right upper limb, including shoulder: Secondary | ICD-10-CM | POA: Diagnosis not present

## 2022-08-23 DIAGNOSIS — D225 Melanocytic nevi of trunk: Secondary | ICD-10-CM | POA: Diagnosis not present

## 2022-08-23 DIAGNOSIS — D1801 Hemangioma of skin and subcutaneous tissue: Secondary | ICD-10-CM | POA: Diagnosis not present

## 2022-08-23 DIAGNOSIS — D2271 Melanocytic nevi of right lower limb, including hip: Secondary | ICD-10-CM | POA: Diagnosis not present

## 2022-08-23 DIAGNOSIS — D2262 Melanocytic nevi of left upper limb, including shoulder: Secondary | ICD-10-CM | POA: Diagnosis not present

## 2022-08-23 DIAGNOSIS — D239 Other benign neoplasm of skin, unspecified: Secondary | ICD-10-CM | POA: Diagnosis not present

## 2022-08-23 DIAGNOSIS — L249 Irritant contact dermatitis, unspecified cause: Secondary | ICD-10-CM | POA: Diagnosis not present

## 2022-08-23 DIAGNOSIS — L821 Other seborrheic keratosis: Secondary | ICD-10-CM | POA: Diagnosis not present

## 2022-08-24 ENCOUNTER — Ambulatory Visit
Admission: EM | Admit: 2022-08-24 | Discharge: 2022-08-24 | Disposition: A | Discharge status: Home / Self Care | Payer: Medicare PPO | Attending: Urgent Care | Admitting: Urgent Care

## 2022-08-24 DIAGNOSIS — H6123 Impacted cerumen, bilateral: Secondary | ICD-10-CM | POA: Diagnosis not present

## 2022-08-24 NOTE — ED Provider Notes (Signed)
[facility]   MRN: [id number] DOB: 06/01/1951  Subjective:   Tracy Thompson is a 71 y.o. female presenting for day history of recurrent bilateral ear fullness, earwax buildup worse to the right side.  Patient has had difficulty with earwax buildup and has been using Debrox.  No pain, fever.  No current facility-administered medications for this encounter.  Current Outpatient Medications:    amLODipine (NORVASC) 2.5 MG tablet, TAKE 1 TABLET(2.5 MG) BY MOUTH DAILY, Disp: 90 tablet, Rfl: 1   aspirin 81 MG tablet, Take 81 mg by mouth daily., Disp: , Rfl:    atorvastatin (LIPITOR) 10 MG tablet, TAKE 1 TABLET(10 MG) BY MOUTH DAILY, Disp: 90 tablet, Rfl: 1   bisoprolol-hydrochlorothiazide (ZIAC) 10-6.25 MG tablet, Take 1 tablet by mouth daily., Disp: 90 tablet, Rfl: 1   cholecalciferol (VITAMIN D) 1000 units tablet, Take 2,000 Units by mouth daily., Disp: , Rfl:    Coenzyme Q10 200 MG capsule, Take 200 mg by mouth daily., Disp: , Rfl:    COVID-19 mRNA bivalent vaccine, Pfizer, (PFIZER COVID-19 VAC BIVALENT) injection, Inject into the muscle., Disp: 0.3 mL, Rfl: 0   hydrochlorothiazide (HYDRODIURIL) 25 MG tablet, TAKE 1 TABLET BY MOUTH EVERY DAY, Disp: 90 tablet, Rfl: 1   meclizine (ANTIVERT) 12.5 MG tablet, Take 1 tablet (12.5 mg total) by mouth 3 (three) times daily as needed for dizziness., Disp: 30 tablet, Rfl: 0   potassium chloride SA (KLOR-CON M) 20 MEQ tablet, Take 1 tablet (20 mEq total) by mouth daily., Disp: 90 tablet, Rfl: 1   No Known Allergies  Past Medical History:  Diagnosis Date   Breast cancer ([place]) 1994   s/p chemo 8/94-1/95, radiation 8/94-11/94   Cataracts, bilateral    Factor 5 Leiden mutation, heterozygous ([place]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 Jones Skene    Family History  Problem Relation Age of Onset   Cancer Mother        Bladder CA, colon   Colon cancer Mother    Stroke Mother    Hypertension Mother    Hyperlipidemia Mother    Hepatitis Mother        hep A   COPD Mother    Dementia Mother    Hypertension Father    Prostate cancer Father    Diabetes Father    Stroke Father    Pulmonary embolism Father    Deep vein thrombosis Father    Cancer Father        prostate cancer   Schizophrenia Brother    Mental illness Brother    COPD Brother    Hepatitis C Brother    Liver disease Other     Social History   Tobacco Use   Smoking status: Never   Smokeless tobacco: Never  Substance Use Topics   Alcohol use: No   Drug use: No    ROS   Objective:   Vitals: BP 127/87 (BP Location: Right Arm)   Pulse 85   Temp 98.3 F (36.8 C) (Oral)   Resp 16   SpO2 95%   Physical Exam Constitutional:      General:  She is not in acute distress.    Appearance: Normal appearance. She is well-developed. She is not ill-appearing, toxic-appearing or diaphoretic.  HENT:     Head: Normocephalic and atraumatic.     Right Ear: Tympanic membrane, ear canal and external ear normal. No tenderness. There is impacted cerumen. Tympanic membrane is not injected, perforated, erythematous or bulging.     Left Ear: Tympanic membrane, ear canal and external ear normal. No tenderness. There is impacted cerumen. Tympanic membrane is not injected, perforated, erythematous or bulging.     Nose: Nose normal.     Mouth/Throat:     Mouth: Mucous membranes are moist.  Eyes:     General: No scleral icterus.       Right eye: No discharge.        Left eye: No discharge.     Extraocular Movements: Extraocular movements intact.  Cardiovascular:     Rate and Rhythm: Normal rate.  Pulmonary:     Effort: Pulmonary effort is normal.   Skin:    General: Skin is warm and dry.  Neurological:     General: No focal deficit present.     Mental Status: She is alert and oriented to person, place, and time.  Psychiatric:        Mood and Affect: Mood normal.        Behavior: Behavior normal.    Ear lavage performed using mixture of peroxide and water.  Pressure irrigation performed using a bottle and a thin ear tube.  Bilateral ear lavage.  Curette was used for manual removal.   Assessment and Plan :   PDMP not reviewed this encounter.  1. Bilateral impacted cerumen    Continue use of Debrox as needed. Successful bilateral ear lavage.  General management of cerumen impaction reviewed with patient.  Anticipatory guidance provided. Counseled patient on potential for adverse effects with medications prescribed/recommended today, ER and return-to-clinic precautions discussed, patient verbalized understanding.    Jaynee Eagles, [place] 08/24/22 [phone]

## 2022-08-24 NOTE — ED Triage Notes (Signed)
Pt. Has repeat right ear fullness for over 6 months. States it triggers her vertigo and she Is unable to hear out of it. She has been treating it with Debrox for the last four days.

## 2022-09-01 DIAGNOSIS — Z780 Asymptomatic menopausal state: Secondary | ICD-10-CM | POA: Diagnosis not present

## 2022-09-01 DIAGNOSIS — Z1231 Encounter for screening mammogram for malignant neoplasm of breast: Secondary | ICD-10-CM | POA: Diagnosis not present

## 2022-09-01 DIAGNOSIS — Z853 Personal history of malignant neoplasm of breast: Secondary | ICD-10-CM | POA: Diagnosis not present

## 2022-09-01 LAB — HM MAMMOGRAPHY

## 2022-09-01 LAB — HM DEXA SCAN: HM Dexa Scan: NORMAL

## 2022-09-18 ENCOUNTER — Other Ambulatory Visit: Payer: Self-pay | Admitting: Family Medicine

## 2022-09-18 DIAGNOSIS — E785 Hyperlipidemia, unspecified: Secondary | ICD-10-CM

## 2022-09-18 DIAGNOSIS — I10 Essential (primary) hypertension: Secondary | ICD-10-CM

## 2022-09-21 ENCOUNTER — Ambulatory Visit (HOSPITAL_BASED_OUTPATIENT_CLINIC_OR_DEPARTMENT_OTHER)
Admission: RE | Admit: 2022-09-21 | Discharge: 2022-09-21 | Disposition: A | Discharge status: Home / Self Care | Payer: Medicare PPO | Source: Ambulatory Visit | Attending: Family Medicine | Admitting: Family Medicine

## 2022-09-21 ENCOUNTER — Ambulatory Visit (INDEPENDENT_AMBULATORY_CARE_PROVIDER_SITE_OTHER): Payer: Medicare PPO | Admitting: Family Medicine

## 2022-09-21 ENCOUNTER — Encounter: Payer: Self-pay | Admitting: Family Medicine

## 2022-09-21 VITALS — BP 112/70 | HR 83 | Temp 98.2°F | Resp 18 | Ht 66.0 in | Wt 188.4 lb

## 2022-09-21 DIAGNOSIS — Z1211 Encounter for screening for malignant neoplasm of colon: Secondary | ICD-10-CM | POA: Diagnosis not present

## 2022-09-21 DIAGNOSIS — H8110 Benign paroxysmal vertigo, unspecified ear: Secondary | ICD-10-CM | POA: Diagnosis not present

## 2022-09-21 DIAGNOSIS — E785 Hyperlipidemia, unspecified: Secondary | ICD-10-CM | POA: Diagnosis not present

## 2022-09-21 DIAGNOSIS — E559 Vitamin D deficiency, unspecified: Secondary | ICD-10-CM

## 2022-09-21 DIAGNOSIS — M545 Low back pain, unspecified: Secondary | ICD-10-CM | POA: Diagnosis not present

## 2022-09-21 DIAGNOSIS — I10 Essential (primary) hypertension: Secondary | ICD-10-CM

## 2022-09-21 DIAGNOSIS — Z0000 Encounter for general adult medical examination without abnormal findings: Secondary | ICD-10-CM

## 2022-09-21 DIAGNOSIS — R739 Hyperglycemia, unspecified: Secondary | ICD-10-CM | POA: Diagnosis not present

## 2022-09-21 DIAGNOSIS — M25552 Pain in left hip: Secondary | ICD-10-CM | POA: Diagnosis not present

## 2022-09-21 DIAGNOSIS — R82998 Other abnormal findings in urine: Secondary | ICD-10-CM | POA: Diagnosis not present

## 2022-09-21 DIAGNOSIS — M47816 Spondylosis without myelopathy or radiculopathy, lumbar region: Secondary | ICD-10-CM | POA: Diagnosis not present

## 2022-09-21 DIAGNOSIS — Z23 Encounter for immunization: Secondary | ICD-10-CM | POA: Diagnosis not present

## 2022-09-21 LAB — CBC WITH DIFFERENTIAL/PLATELET
Basophils Absolute: 0 10*3/uL (ref 0.0–0.1)
Basophils Relative: 0.6 % (ref 0.0–3.0)
Eosinophils Absolute: 0.1 10*3/uL (ref 0.0–0.7)
Eosinophils Relative: 1.1 % (ref 0.0–5.0)
HCT: 40.5 % (ref 36.0–46.0)
Hemoglobin: 13.6 g/dL (ref 12.0–15.0)
Lymphocytes Relative: 24.8 % (ref 12.0–46.0)
Lymphs Abs: 1.7 10*3/uL (ref 0.7–4.0)
MCHC: 33.7 g/dL (ref 30.0–36.0)
MCV: 88.4 fl (ref 78.0–100.0)
Monocytes Absolute: 0.7 10*3/uL (ref 0.1–1.0)
Monocytes Relative: 10.7 % (ref 3.0–12.0)
Neutro Abs: 4.2 10*3/uL (ref 1.4–7.7)
Neutrophils Relative %: 62.8 % (ref 43.0–77.0)
Platelets: 368 10*3/uL (ref 150.0–400.0)
RBC: 4.59 Mil/uL (ref 3.87–5.11)
RDW: 14.3 % (ref 11.5–15.5)
WBC: 6.7 10*3/uL (ref 4.0–10.5)

## 2022-09-21 LAB — MICROALBUMIN / CREATININE URINE RATIO
Creatinine,U: 327.8 mg/dL
Microalb Creat Ratio: 0.5 mg/g (ref 0.0–30.0)
Microalb, Ur: 1.7 mg/dL (ref 0.0–1.9)

## 2022-09-21 LAB — COMPREHENSIVE METABOLIC PANEL
ALT: 70 U/L — ABNORMAL HIGH (ref 0–35)
AST: 78 U/L — ABNORMAL HIGH (ref 0–37)
Albumin: 3.9 g/dL (ref 3.5–5.2)
Alkaline Phosphatase: 195 U/L — ABNORMAL HIGH (ref 39–117)
BUN: 15 mg/dL (ref 6–23)
CO2: 30 mEq/L (ref 19–32)
Calcium: 9.7 mg/dL (ref 8.4–10.5)
Chloride: 94 mEq/L — ABNORMAL LOW (ref 96–112)
Creatinine, Ser: 1.04 mg/dL (ref 0.40–1.20)
GFR: 54.64 mL/min — ABNORMAL LOW (ref >60.00)
Glucose, Bld: 100 mg/dL — ABNORMAL HIGH (ref 70–99)
Potassium: 3.7 mEq/L (ref 3.5–5.1)
Sodium: 134 mEq/L — ABNORMAL LOW (ref 135–145)
Total Bilirubin: 0.7 mg/dL (ref 0.2–1.2)
Total Protein: 7.4 g/dL (ref 6.0–8.3)

## 2022-09-21 LAB — LIPID PANEL
Cholesterol: 106 mg/dL (ref 0–200)
HDL: 44.2 mg/dL (ref >39.00)
LDL Cholesterol: 48 mg/dL (ref 0–99)
NonHDL: 61.35
Total CHOL/HDL Ratio: 2
Triglycerides: 68 mg/dL (ref 0.0–149.0)
VLDL: 13.6 mg/dL (ref 0.0–40.0)

## 2022-09-21 LAB — POC URINALSYSI DIPSTICK (AUTOMATED)
Blood, UA: NEGATIVE
Glucose, UA: NEGATIVE
Nitrite, UA: NEGATIVE
Protein, UA: NEGATIVE
Spec Grav, UA: 1.025 (ref 1.010–1.025)
Urobilinogen, UA: 0.2 E.U./dL
pH, UA: 6 (ref 5.0–8.0)

## 2022-09-21 LAB — TSH: TSH: 4.35 u[IU]/mL (ref 0.35–5.50)

## 2022-09-21 LAB — VITAMIN D 25 HYDROXY (VIT D DEFICIENCY, FRACTURES): VITD: 59.59 ng/mL (ref 30.00–100.00)

## 2022-09-21 LAB — VITAMIN B12: Vitamin B-12: >1500 pg/mL — ABNORMAL HIGH (ref 211–911)

## 2022-09-21 MED ORDER — CYCLOBENZAPRINE HCL 10 MG PO TABS: 10.0000 mg | ORAL_TABLET | Freq: Three times a day (TID) | ORAL | 0 refills | Status: DC | PRN

## 2022-09-21 MED ORDER — MELOXICAM 7.5 MG PO TABS: 7.5000 mg | ORAL_TABLET | Freq: Every day | ORAL | 0 refills | Status: DC

## 2022-09-21 MED ORDER — MECLIZINE HCL 25 MG PO TABS: 25.0000 mg | ORAL_TABLET | Freq: Three times a day (TID) | ORAL | 0 refills | Status: DC | PRN

## 2022-09-21 MED ORDER — MECLIZINE HCL 12.5 MG PO TABS: 12.5000 mg | ORAL_TABLET | Freq: Three times a day (TID) | ORAL | 4 refills | Status: DC | PRN

## 2022-09-21 NOTE — Progress Notes (Signed)
Subjective:     Tracy Thompson is a 71 y.o. female and is here for a comprehensive physical exam. The patient reports problems - L hip pain x several years    worsened recently and L Leg is weak.  She is having trouble with getting into cabinets and back pain radiating to L eg -- she has tried IB with some relief  . She is also f/u chol and bp.   She is also c/o decrease energy --- tired all the time  Social History   Socioeconomic History   Marital status: Married    Spouse name: Not on file   Number of children: Not on file   Years of education: Not on file   Highest education level: Not on file  Occupational History   Occupation: retired Product manager: RETIRED  Tobacco Use   Smoking status: Never   Smokeless tobacco: Never  Substance and Sexual Activity   Alcohol use: No   Drug use: No   Sexual activity: Yes    Partners: Male  Other Topics Concern   Not on file  Social History Narrative   Exercise--- walking   Social Determinants of Health   Financial Resource Strain: Low Risk  (07/20/2021)   Overall Financial Resource Strain (CARDIA)    Difficulty of Paying Living Expenses: Not hard at all  Food Insecurity: No Food Insecurity (07/07/2020)   Hunger Vital Sign    Worried About Running Out of Food in the Last Year: Never true    [place] in the Last Year: Never true  Transportation Needs: No Transportation Needs (07/20/2021)   PRAPARE - Hydrologist (Medical): No    Lack of Transportation (Non-Medical): No  Physical Activity: Sufficiently Active (07/20/2021)   Exercise Vital Sign    Days of Exercise per Week: 7 days    Minutes of Exercise per Session: 30 min  Stress: No Stress Concern Present (07/20/2021)   [place]    Feeling of Stress : Not at all  Social Connections: Moderately Isolated (07/20/2021)   Social Connection and Isolation Panel [NHANES]    Frequency  of Communication with Friends and Family: More than three times a week    Frequency of Social Gatherings with Friends and Family: More than three times a week    Attends Religious Services: Never    Marine scientist or Organizations: No    Attends Archivist Meetings: Never    Marital Status: Married  Human resources officer Violence: Not At Risk (07/20/2021)   Humiliation, Afraid, Rape, and Kick questionnaire    Fear of Current or Ex-Partner: No    Emotionally Abused: No    Physically Abused: No    Sexually Abused: No   Health Maintenance  Topic Date Due   COVID-19 Vaccine (6 - Pfizer risk series) 12/18/2021   COLONOSCOPY (Pts 45-72yrs Insurance coverage will need to be confirmed)  07/25/2022   INFLUENZA VACCINE  07/27/2022   TETANUS/TDAP  04/17/2023   MAMMOGRAM  09/02/2023   DEXA SCAN  09/01/2024   Pneumonia Vaccine 42+ Years old  Completed   Hepatitis C Screening  Completed   Zoster Vaccines- Shingrix  Completed   HPV VACCINES  Aged Out    The following portions of the patient's history were reviewed and updated as appropriate: She  has a past medical history of Breast cancer ([place]) (1994), Cataracts, bilateral, Factor 5  Leiden mutation, heterozygous ([place]), Hypertension, and Menopause. She does not have any pertinent problems on file. She  has a past surgical history that includes Breast lumpectomy (Right, 1994); varicose veins (Bilateral, 2013); and Cholecystectomy (06/20/2002). Her family history includes COPD in her brother and mother; Cancer in her father and mother; Colon cancer in her mother; Deep vein thrombosis in her father; Dementia in her mother; Diabetes in her father; Hepatitis in her mother; Hepatitis C in her brother; Hyperlipidemia in her mother; Hypertension in her father and mother; Liver disease in an other family member; Mental illness in her brother; Prostate cancer in her father; Pulmonary embolism in her father; Schizophrenia in her brother; Stroke in her  father and mother. She  reports that she has never smoked. She has never used smokeless tobacco. She reports that she does not drink alcohol and does not use drugs. She has a current medication list which includes the following prescription(s): amlodipine, aspirin, atorvastatin, bisoprolol-hydrochlorothiazide, cholecalciferol, coenzyme q10, hydrochlorothiazide, meclizine, and potassium chloride sa. Current Outpatient Medications on File Prior to Visit  Medication Sig Dispense Refill   amLODipine (NORVASC) 2.5 MG tablet TAKE 1 TABLET(2.5 MG) BY MOUTH DAILY 90 tablet 1   aspirin 81 MG tablet Take 81 mg by mouth daily.     atorvastatin (LIPITOR) 10 MG tablet TAKE 1 TABLET(10 MG) BY MOUTH DAILY 90 tablet 1   bisoprolol-hydrochlorothiazide (ZIAC) 10-6.25 MG tablet TAKE 1 TABLET BY MOUTH DAILY 90 tablet 1   cholecalciferol (VITAMIN D) 1000 units tablet Take 2,000 Units by mouth daily.     Coenzyme Q10 200 MG capsule Take 200 mg by mouth daily.     hydrochlorothiazide (HYDRODIURIL) 25 MG tablet TAKE 1 TABLET BY MOUTH EVERY DAY 90 tablet 1   meclizine (ANTIVERT) 12.5 MG tablet Take 1 tablet (12.5 mg total) by mouth 3 (three) times daily as needed for dizziness. 30 tablet 0   potassium chloride SA (KLOR-CON M) 20 MEQ tablet TAKE 1 TABLET BY MOUTH DAILY 90 tablet 1   No current facility-administered medications on file prior to visit.   She has No Known Allergies..  Review of Systems Review of Systems  Constitutional: Negative for activity change, appetite change and fatigue.  HENT: Negative for hearing loss, congestion, tinnitus and ear discharge.  dentist q64m Eyes: Negative for visual disturbance (see optho q1y --+ glaucoma   Respiratory: Negative for cough, chest tightness and shortness of breath.   Cardiovascular: Negative for chest pain, palpitations and leg swelling.  Gastrointestinal: Negative for abdominal pain, diarrhea, constipation and abdominal distention.  Genitourinary: Negative for  urgency, frequency, decreased urine volume and difficulty urinating.  Musculoskeletal: Negative for back pain, arthralgias and gait problem.  Skin: Negative for color change, pallor and rash.  Neurological: Negative for dizziness, light-headedness, numbness and headaches.  Hematological: Negative for adenopathy. Does not bruise/bleed easily.  Psychiatric/Behavioral: Negative for suicidal ideas, confusion, sleep disturbance, self-injury, dysphoric mood, decreased concentration and agitation.      Objective:    BP 112/70 (BP Location: Left Arm, Patient Position: Sitting, Cuff Size: Large)   Pulse 83   Temp 98.2 F (36.8 C) (Oral)   Resp 18   Ht 5\' 6"  (1.676 m)   Wt 188 lb 6.4 oz (85.5 kg)   SpO2 95%   BMI 30.41 kg/m  General appearance: alert, cooperative, appears stated age, and no distress Head: Normocephalic, without obvious abnormality, atraumatic Eyes: conjunctivae/corneas clear. PERRL, EOM's intact. Fundi benign. Ears: normal TM's and external ear canals both ears Nose:  Nares normal. Septum midline. Mucosa normal. No drainage or sinus tenderness. Throat: lips, mucosa, and tongue normal; teeth and gums normal Neck: no adenopathy, no carotid bruit, no JVD, supple, symmetrical, trachea midline, and thyroid not enlarged, symmetric, no tenderness/mass/nodules Back: symmetric, no curvature. ROM normal. No CVA tenderness. Lungs: clear to auscultation bilaterally Heart: regular rate and rhythm, S1, S2 normal, no murmur, click, rub or gallop Abdomen: soft, non-tender; bowel sounds normal; no masses,  no organomegaly Extremities: extremities normal, atraumatic, no cyanosis or edema Pulses: 2+ and symmetric Skin: Skin color, texture, turgor normal. No rashes or lesions Lymph nodes: Cervical, supraclavicular, and axillary nodes normal. Neurologic: Alert and oriented X 3, normal strength and tone. Normal symmetric reflexes. Normal coordination and gait    Assessment:    Healthy female  exam.      Plan:    Ghm utd Check labs  See After Visit Summary for Counseling Recommendations   1. Benign paroxysmal positional vertigo, unspecified laterality Stable  - meclizine (ANTIVERT) 12.5 MG tablet; Take 1 tablet (12.5 mg total) by mouth 3 (three) times daily as needed for dizziness.  Dispense: 30 tablet; Refill: 4 - meclizine (ANTIVERT) 25 MG tablet; Take 1 tablet (25 mg total) by mouth 3 (three) times daily as needed for dizziness.  Dispense: 30 tablet; Refill: 0  2. Colon cancer screening   - Ambulatory referral to Gastroenterology  3. Need for influenza vaccination   - Flu vaccine HIGH DOSE PF (Fluzone High dose)  4. Low back pain with radiation Worsening  Check xray  - DG Lumbar Spine Complete; Future - POCT Urinalysis Dipstick (Automated) - Microalbumin / creatinine urine ratio - cyclobenzaprine (FLEXERIL) 10 MG tablet; Take 1 tablet (10 mg total) by mouth 3 (three) times daily as needed for muscle spasms.  Dispense: 30 tablet; Refill: 0 - meloxicam (MOBIC) 7.5 MG tablet; Take 1 tablet (7.5 mg total) by mouth daily.  Dispense: 30 tablet; Refill: 0  5. Hyperlipidemia, unspecified hyperlipidemia type Encourage heart healthy diet such as MIND or DASH diet, increase exercise, avoid trans fats, simple carbohydrates and processed foods, consider a krill or fish or flaxseed oil cap daily.   - Comprehensive metabolic panel - Lipid panel  6. Essential hypertension Well controlled, no changes to meds. Encouraged heart healthy diet such as the DASH diet and exercise as tolerated.   - CBC with Differential/Platelet - Comprehensive metabolic panel - Lipid panel - TSH - Vitamin B12 - VITAMIN D 25 Hydroxy (Vit-D Deficiency, Fractures) - POCT Urinalysis Dipstick (Automated)  7. Vitamin D deficiency   - VITAMIN D 25 Hydroxy (Vit-D Deficiency, Fractures)  8. Preventative health care Ghm utd  Check labs  See avs   9. Hyperglycemia Check labs  - Microalbumin /  creatinine urine ratio  10. Morbid obesity due to excess calories (HCC) Check labs  Con't diet and exercise  - CBC with Differential/Platelet - Comprehensive metabolic panel - Lipid panel - TSH - Vitamin B12 - VITAMIN D 25 Hydroxy (Vit-D Deficiency, Fractures)  11. Left hip pain   - DG Hip Unilat W OR W/O Pelvis Min 4 Views Left; Future  12. Leukocytes in urine   - Urine Culture

## 2022-09-21 NOTE — Patient Instructions (Signed)
Preventive Care 65 Years and Older, Female Preventive care refers to lifestyle choices and visits with your health care provider that can promote health and wellness. Preventive care visits are also called wellness exams. What can I expect for my preventive care visit? Counseling Your health care provider may ask you questions about your: Medical history, including: Past medical problems. Family medical history. Pregnancy and menstrual history. History of falls. Current health, including: Memory and ability to understand (cognition). Emotional well-being. Home life and relationship well-being. Sexual activity and sexual health. Lifestyle, including: Alcohol, nicotine or tobacco, and drug use. Access to firearms. Diet, exercise, and sleep habits. Work and work environment. Sunscreen use. Safety issues such as seatbelt and bike helmet use. Physical exam Your health care provider will check your: Height and weight. These may be used to calculate your BMI (body mass index). BMI is a measurement that tells if you are at a healthy weight. Waist circumference. This measures the distance around your waistline. This measurement also tells if you are at a healthy weight and may help predict your risk of certain diseases, such as type 2 diabetes and high blood pressure. Heart rate and blood pressure. Body temperature. Skin for abnormal spots. What immunizations do I need?  Vaccines are usually given at various ages, according to a schedule. Your health care provider will recommend vaccines for you based on your age, medical history, and lifestyle or other factors, such as travel or where you work. What tests do I need? Screening Your health care provider may recommend screening tests for certain conditions. This may include: Lipid and cholesterol levels. Hepatitis C test. Hepatitis B test. HIV (human immunodeficiency virus) test. STI (sexually transmitted infection) testing, if you are at  risk. Lung cancer screening. Colorectal cancer screening. Diabetes screening. This is done by checking your blood sugar (glucose) after you have not eaten for a while (fasting). Mammogram. Talk with your health care provider about how often you should have regular mammograms. BRCA-related cancer screening. This may be done if you have a family history of breast, ovarian, tubal, or peritoneal cancers. Bone density scan. This is done to screen for osteoporosis. Talk with your health care provider about your test results, treatment options, and if necessary, the need for more tests. Follow these instructions at home: Eating and drinking  Eat a diet that includes fresh fruits and vegetables, whole grains, lean protein, and low-fat dairy products. Limit your intake of foods with high amounts of sugar, saturated fats, and salt. Take vitamin and mineral supplements as recommended by your health care provider. Do not drink alcohol if your health care provider tells you not to drink. If you drink alcohol: Limit how much you have to 0-1 drink a day. Know how much alcohol is in your drink. In the [place] one drink equals one 12 oz bottle of beer (355 mL), one 5 oz glass of wine (148 mL), or one 1 oz glass of hard liquor (44 mL). Lifestyle Brush your teeth every morning and night with fluoride toothpaste. Floss one time each day. Exercise for at least 30 minutes 5 or more days each week. Do not use any products that contain nicotine or tobacco. These products include cigarettes, chewing tobacco, and vaping devices, such as e-cigarettes. If you need help quitting, ask your health care provider. Do not use drugs. If you are sexually active, practice safe sex. Use a condom or other form of protection in order to prevent STIs. Take aspirin only as told by   your health care provider. Make sure that you understand how much to take and what form to take. Work with your health care provider to find out whether it  is safe and beneficial for you to take aspirin daily. Ask your health care provider if you need to take a cholesterol-lowering medicine (statin). Find healthy ways to manage stress, such as: Meditation, yoga, or listening to music. Journaling. Talking to a trusted person. Spending time with friends and family. Minimize exposure to UV radiation to reduce your risk of skin cancer. Safety Always wear your seat belt while driving or riding in a vehicle. Do not drive: If you have been drinking alcohol. Do not ride with someone who has been drinking. When you are tired or distracted. While texting. If you have been using any mind-altering substances or drugs. Wear a helmet and other protective equipment during sports activities. If you have firearms in your house, make sure you follow all gun safety procedures. What's next? Visit your health care provider once a year for an annual wellness visit. Ask your health care provider how often you should have your eyes and teeth checked. Stay up to date on all vaccines. This information is not intended to replace advice given to you by your health care provider. Make sure you discuss any questions you have with your health care provider. Document Revised: 06/10/2021 Document Reviewed: 06/10/2021 Elsevier Patient Education  2023 Elsevier Inc.  

## 2022-09-22 ENCOUNTER — Other Ambulatory Visit: Payer: Self-pay | Admitting: Family Medicine

## 2022-09-22 ENCOUNTER — Telehealth: Payer: Self-pay

## 2022-09-22 DIAGNOSIS — R748 Abnormal levels of other serum enzymes: Secondary | ICD-10-CM

## 2022-09-22 DIAGNOSIS — M25552 Pain in left hip: Secondary | ICD-10-CM

## 2022-09-22 DIAGNOSIS — R937 Abnormal findings on diagnostic imaging of other parts of musculoskeletal system: Secondary | ICD-10-CM

## 2022-09-22 NOTE — Telephone Encounter (Signed)
Okay to wait for PCP tomorrow

## 2022-09-22 NOTE — Telephone Encounter (Signed)
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 These may be chronic however no comparison is available.   Possible irregular lucency is seen involving the medial portion of the left acetabulum concerning for possible lytic or destructive process. Further evaluation with CT or MRI scan is recommended.   Will send to PCP and Dr. Larose Kells- DOD

## 2022-09-22 NOTE — Telephone Encounter (Signed)
Noted  

## 2022-09-23 ENCOUNTER — Ambulatory Visit
Admission: RE | Admit: 2022-09-23 | Discharge: 2022-09-23 | Disposition: A | Discharge status: Home / Self Care | Payer: Medicare PPO | Source: Ambulatory Visit | Attending: Family Medicine | Admitting: Family Medicine

## 2022-09-23 DIAGNOSIS — M25552 Pain in left hip: Secondary | ICD-10-CM | POA: Diagnosis not present

## 2022-09-23 LAB — URINE CULTURE
MICRO NUMBER:: 13969345
SPECIMEN QUALITY:: ADEQUATE

## 2022-09-24 ENCOUNTER — Encounter: Payer: Self-pay | Admitting: *Deleted

## 2022-09-24 ENCOUNTER — Other Ambulatory Visit: Payer: Self-pay | Admitting: Family Medicine

## 2022-09-24 ENCOUNTER — Telehealth: Payer: Self-pay

## 2022-09-24 DIAGNOSIS — Z853 Personal history of malignant neoplasm of breast: Secondary | ICD-10-CM

## 2022-09-24 NOTE — Telephone Encounter (Signed)
Nurse Assessment Nurse: Terence Lux, RN, Christine Date/Time (Eastern Time): 09/23/2022 7:53:09 PM Confirm and document reason for call. If symptomatic, describe symptoms. ---Radiology report Does the patient have any new or worsening symptoms? ---No Nurse: Terence Lux, RN, Christine Date/Time (Eastern Time): 09/23/2022 7:57:53 PM Is there an on-call provider listed? ---Yes Please list name of person reporting value (Lab Employee) and a contact number. ---Suanne Marker [phone] Please document the following items: Lab name Lab value (read back to lab to verify) Reference range for lab value Date and time blood was drawn Collect time of birth for bilirubin results ---MRI Left hip read by Dr. Ree Edman (order at 5:56PM) Widespread marrow replacing bone lesions throughout bony pelvis. B/L femur and imaged lower lumbar spine. Compatible with osseous metastatic diagnosis. Dominant lesion is large,slightly expansial lesion. Centered w/i Left Acetabulum w/cortical thinning & peri-ostia edema. No well developed pathological fracture within this lesion. Disp. Time Eilene Ghazi Time) Disposition Final User 09/23/2022 8:09:54 PM Called On-Call Provider Terence Lux, RN, Christine 09/23/2022 8:10:24 PM Clinical Call Yes Terence Lux, RN, Christine Final Disposition 09/23/2022 8:10:24 PM Clinical Call Yes Terence Lux, RN, Christine PLEASE NOTE: All timestamps contained within this report are represented as [place] Standard Time. CONFIDENTIALTY NOTICE: This fax transmission is intended only for the addressee. It contains information that is legally privileged, confidential or otherwise protected from use or disclosure. If you are not the intended recipient, you are strictly prohibited from reviewing, disclosing, copying using or disseminating any of this information or taking any action in reliance on or regarding this information. If you have received this fax in error, please notify us immediately by telephone so that we  can arrange for its return to [place]. Phone: [phone], Toll-Free: [phone], Fax: [phone] Page: 2 of 2 Call Id: [id number] Paging DoctorName Phone DateTime Result/ Outcome Message Type Notes McClean-Scocuzza, Olivia Mackie [phone] 09/23/2022 8:09:54 PM Called On Call Provider - Reached Doctor Paged McClean-Scocuzza, Olivia Mackie 09/23/2022 8:10:11 PM Spoke with On Call - General Message Result ocp will cc to pcp in am

## 2022-09-24 NOTE — Progress Notes (Signed)
Reached out to [employer] to introduce myself as the office RN Navigator and explain our new patient process. Reviewed the reason for their referral and scheduled their new patient appointment along with labs. Provided address and directions to the office including call back phone number. Reviewed with patient any concerns they may have or any possible barriers to attending their appointment.   Informed patient about my role as a navigator and that I will meet with them prior to their New Patient appointment and more fully discuss what services I can provide. At this time patient has no further questions or needs.    Oncology Nurse Navigator Documentation     09/24/2022    9:30 AM  Oncology Nurse Navigator Flowsheets  Abnormal Finding Date 09/23/2022  Diagnosis Status Additional Work Up  Navigator Follow Up Date: 09/29/2022  Navigator Follow Up Reason: New Patient Appointment  Navigator Location [facility]  Navigator Encounter Type Introductory Phone Call  Patient Visit Type MedOnc  Treatment Phase Abnormal Scans  Barriers/Navigation Needs Coordination of Care;Education  Education Other  Interventions Coordination of Care;Education  Acuity Level 2-Minimal Needs (1-2 Barriers Identified)  Coordination of Care Appts  Education Method Verbal;Teach-back  Time Spent with Patient 30

## 2022-09-26 ENCOUNTER — Inpatient Hospital Stay: Admission: RE | Admit: 2022-09-26 | Payer: Medicare PPO | Source: Ambulatory Visit

## 2022-09-26 ENCOUNTER — Other Ambulatory Visit: Payer: Medicare PPO

## 2022-09-27 ENCOUNTER — Ambulatory Visit
Admission: RE | Admit: 2022-09-27 | Discharge: 2022-09-27 | Disposition: A | Discharge status: Home / Self Care | Payer: Medicare PPO | Source: Ambulatory Visit | Attending: Family Medicine | Admitting: Family Medicine

## 2022-09-27 DIAGNOSIS — C7951 Secondary malignant neoplasm of bone: Secondary | ICD-10-CM | POA: Diagnosis not present

## 2022-09-27 DIAGNOSIS — M47816 Spondylosis without myelopathy or radiculopathy, lumbar region: Secondary | ICD-10-CM | POA: Diagnosis not present

## 2022-09-27 DIAGNOSIS — R937 Abnormal findings on diagnostic imaging of other parts of musculoskeletal system: Secondary | ICD-10-CM

## 2022-09-27 DIAGNOSIS — M545 Low back pain, unspecified: Secondary | ICD-10-CM | POA: Diagnosis not present

## 2022-09-27 DIAGNOSIS — M549 Dorsalgia, unspecified: Secondary | ICD-10-CM | POA: Diagnosis not present

## 2022-09-29 ENCOUNTER — Inpatient Hospital Stay: Payer: Medicare PPO | Admitting: Licensed Clinical Social Worker

## 2022-09-29 ENCOUNTER — Inpatient Hospital Stay: Payer: Medicare PPO | Admitting: Hematology & Oncology

## 2022-09-29 ENCOUNTER — Encounter: Payer: Self-pay | Admitting: *Deleted

## 2022-09-29 ENCOUNTER — Inpatient Hospital Stay: Payer: Medicare PPO | Attending: Hematology & Oncology

## 2022-09-29 ENCOUNTER — Other Ambulatory Visit: Payer: Self-pay

## 2022-09-29 ENCOUNTER — Encounter: Payer: Self-pay | Admitting: Hematology & Oncology

## 2022-09-29 VITALS — BP 117/58 | HR 85 | Temp 99.4°F | Resp 18 | Ht 66.0 in | Wt 187.0 lb

## 2022-09-29 DIAGNOSIS — C7951 Secondary malignant neoplasm of bone: Secondary | ICD-10-CM

## 2022-09-29 DIAGNOSIS — Z853 Personal history of malignant neoplasm of breast: Secondary | ICD-10-CM

## 2022-09-29 DIAGNOSIS — Z170 Estrogen receptor positive status [ER+]: Secondary | ICD-10-CM | POA: Diagnosis not present

## 2022-09-29 DIAGNOSIS — I10 Essential (primary) hypertension: Secondary | ICD-10-CM | POA: Diagnosis not present

## 2022-09-29 DIAGNOSIS — Z79810 Long term (current) use of selective estrogen receptor modulators (SERMs): Secondary | ICD-10-CM | POA: Diagnosis not present

## 2022-09-29 DIAGNOSIS — I890 Lymphedema, not elsewhere classified: Secondary | ICD-10-CM | POA: Diagnosis not present

## 2022-09-29 DIAGNOSIS — C50911 Malignant neoplasm of unspecified site of right female breast: Secondary | ICD-10-CM | POA: Diagnosis not present

## 2022-09-29 DIAGNOSIS — Z800 Family history of malignant neoplasm of digestive organs: Secondary | ICD-10-CM | POA: Diagnosis not present

## 2022-09-29 DIAGNOSIS — Z8052 Family history of malignant neoplasm of bladder: Secondary | ICD-10-CM | POA: Insufficient documentation

## 2022-09-29 DIAGNOSIS — Z8042 Family history of malignant neoplasm of prostate: Secondary | ICD-10-CM | POA: Diagnosis not present

## 2022-09-29 DIAGNOSIS — Z9221 Personal history of antineoplastic chemotherapy: Secondary | ICD-10-CM | POA: Diagnosis not present

## 2022-09-29 DIAGNOSIS — C50919 Malignant neoplasm of unspecified site of unspecified female breast: Secondary | ICD-10-CM

## 2022-09-29 DIAGNOSIS — Z923 Personal history of irradiation: Secondary | ICD-10-CM | POA: Insufficient documentation

## 2022-09-29 HISTORY — DX: Malignant neoplasm of unspecified site of unspecified female breast: C50.919

## 2022-09-29 LAB — CBC WITH DIFFERENTIAL (CANCER CENTER ONLY)
Abs Immature Granulocytes: 0.03 10*3/uL (ref 0.00–0.07)
Basophils Absolute: 0 10*3/uL (ref 0.0–0.1)
Basophils Relative: 1 %
Eosinophils Absolute: 0.1 10*3/uL (ref 0.0–0.5)
Eosinophils Relative: 2 %
HCT: 41.4 % (ref 36.0–46.0)
Hemoglobin: 13.6 g/dL (ref 12.0–15.0)
Immature Granulocytes: 0 %
Lymphocytes Relative: 23 %
Lymphs Abs: 1.7 10*3/uL (ref 0.7–4.0)
MCH: 29.1 pg (ref 26.0–34.0)
MCHC: 32.9 g/dL (ref 30.0–36.0)
MCV: 88.5 fL (ref 80.0–100.0)
Monocytes Absolute: 0.9 10*3/uL (ref 0.1–1.0)
Monocytes Relative: 12 %
Neutro Abs: 4.6 10*3/uL (ref 1.7–7.7)
Neutrophils Relative %: 62 %
Platelet Count: 348 10*3/uL (ref 150–400)
RBC: 4.68 MIL/uL (ref 3.87–5.11)
RDW: 14 % (ref 11.5–15.5)
WBC Count: 7.3 10*3/uL (ref 4.0–10.5)
nRBC: 0 % (ref 0.0–0.2)

## 2022-09-29 LAB — CMP (CANCER CENTER ONLY)
ALT: 73 U/L — ABNORMAL HIGH (ref 0–44)
AST: 69 U/L — ABNORMAL HIGH (ref 15–41)
Albumin: 4 g/dL (ref 3.5–5.0)
Alkaline Phosphatase: 205 U/L — ABNORMAL HIGH (ref 38–126)
Anion gap: 9 (ref 5–15)
BUN: 14 mg/dL (ref 8–23)
CO2: 32 mmol/L (ref 22–32)
Calcium: 9.9 mg/dL (ref 8.9–10.3)
Chloride: 94 mmol/L — ABNORMAL LOW (ref 98–111)
Creatinine: 0.99 mg/dL (ref 0.44–1.00)
GFR, Estimated: >60 mL/min (ref >60)
Glucose, Bld: 93 mg/dL (ref 70–99)
Potassium: 3.2 mmol/L — ABNORMAL LOW (ref 3.5–5.1)
Sodium: 135 mmol/L (ref 135–145)
Total Bilirubin: 0.5 mg/dL (ref 0.3–1.2)
Total Protein: 7.8 g/dL (ref 6.5–8.1)

## 2022-09-29 LAB — LACTATE DEHYDROGENASE: LDH: 305 U/L — ABNORMAL HIGH (ref 98–192)

## 2022-09-29 MED ORDER — TRAMADOL HCL 50 MG PO TABS: 50.0000 mg | ORAL_TABLET | Freq: Four times a day (QID) | ORAL | 0 refills | Status: DC | PRN

## 2022-09-29 NOTE — Progress Notes (Signed)
[facility]  Initial Assessment   Tracy Thompson is a 71 y.o. year old female presenting alone. Clinical Social Work was referred by nurse navigator for assessment of psychosocial needs.   SDOH (Social Determinants of Health) assessments performed: Yes SDOH Interventions    Flowsheet Row Clinical Support from 09/29/2022 in [place] Interventions   Utilities Interventions Intervention Not Indicated       [place]: No Food Insecurity (07/07/2020)  Housing: Low Risk  (07/20/2021)  Transportation Needs: No Transportation Needs (07/20/2021)  Utilities: Not At Risk (09/29/2022)  Alcohol Screen: Low Risk  (07/20/2021)  Depression (PHQ2-9): Low Risk  (09/21/2022)  Financial Resource Strain: Low Risk  (07/20/2021)  Physical Activity: Sufficiently Active (07/20/2021)  Social Connections: Moderately Isolated (07/20/2021)  Stress: No Stress Concern Present (07/20/2021)  Tobacco Use: Low Risk  (09/29/2022)     Distress Screen completed: No     No data to display            Family/Social Information:  Housing Arrangement: patient lives with her husband of 56 years. Family members/support persons in your life? Family Transportation concerns: no  Employment: Retired Pharmacist, hospital.  Income source: Paediatric nurse concerns: No Type of concern: None Food access concerns: no Religious or spiritual practice: Not known Services Currently in place:  [employer] Medicare  Coping/ Adjustment to diagnosis: Patient understands treatment plan and what happens next? yes Concerns about diagnosis and/or treatment: How I will care for other members of my family and Relationship with husband or partner Patient reported stressors: Partner.  Her husband is currently ill and they are attempting to determine the cause.  His current medication is causing challenging side-effects. Hopes and/or priorities: To determine what is  causing her spouse to be sick. Patient enjoys time with family/ friends Current coping skills/ strengths: Ability for insight , Average or above average intelligence , Communication skills , Scientist, research (life sciences) , General fund of knowledge , and Motivation for treatment/growth     SUMMARY: Current SDOH Barriers:  Limited social support  Clinical Social Work Clinical Goal(s):  Explore community resource options for unmet needs related to:  Social Connections  Interventions: Discussed common feeling and emotions when being diagnosed with cancer, and the importance of support during treatment Informed patient of the support team roles and support services at [facility] Provided [place] contact information and encouraged patient to call with any questions or concerns Provided patient with information about the [employer]   Follow Up Plan: CSW will follow-up with patient by phone  Patient verbalizes understanding of plan: Yes    Rodman Pickle Katee Wentland, LCSW

## 2022-09-29 NOTE — Progress Notes (Signed)
Initial RN Navigator Patient Visit  Name: Tracy Thompson Date of Referral : 09/27/22 Diagnosis: Bone Lesions, Hx Breast Cancer  Met with patient prior to their visit with MD. Hanley Seamen patient "Your Patient Navigator" handout which explains my role, areas in which I am able to help, and all the contact information for myself and the office. Also gave patient MD and Navigator business card. Reviewed with patient the general overview of expected course after initial diagnosis and time frame for all steps to be completed.  New patient packet given to patient which includes: orientation to office and staff; campus directory; education on My Chart and Advance Directives; and patient centered education on Cancer.   Patient has a history of breast cancer from 63. 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 She had lumpectomy, chemo and radiation. She was 71yo at diagnosis and genetics was not offered (not routine at the time). She now presents with multiple bone lesions, but CT scans have not been completed. She c/o chronic cough.  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 Lives with a supportive husband and has other people of support in her life.   She has many questions about her diagnosis. She also mentions reluctance to receive chemo again.   Patient completed visit with Dr. Marin Olp. Dr Marin Olp will discuss plans for staging and diagnosis. Will follow up tomorrow once note dictated and orders placed. Patient aware of plan.   Patient understands all follow up procedures and expectations. They have my number to reach out for any further clarification or additional needs.   Oncology Nurse Navigator Documentation     09/29/2022   11:15 AM  Oncology Nurse Navigator Flowsheets  Navigator Location [facility]  Navigator Encounter Type Initial MedOnc  Patient Visit Type MedOnc  Treatment Phase Abnormal Scans  Barriers/Navigation Needs Coordination of Care;Education  Education Newly Diagnosed Cancer Education;Pain/  Symptom Management  Interventions Education;Psycho-Social Support  Acuity Level 2-Minimal Needs (1-2 Barriers Identified)  Education Method Verbal;Written  Support Groups/Services Friends and Family  Time Spent with Patient 30

## 2022-09-29 NOTE — Progress Notes (Signed)
Referral MD  Reason for Referral: Metastatic breast cancer-bony metastasis  Chief Complaint  Patient presents with   New Patient (Initial Visit)  : I have cancer my spine now.  HPI: Ms. Chancy is a very charming 71 year old white female.  She is from [place].  She was a Radio producer.  As such, she has very thorough notes from when she had a breast cancer.  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  This was back in 1994.  At that time, she was ER/PR positive.  However, because of her young age at the time, she received chemotherapy.  She had 6 cycles of CMF.  She had radiation therapy afterwards.  She was then placed on tamoxifen for 5 years.  She has been doing well.  Over the past few months, she been having worsening lower back discomfort.  This is been more problematic.  She has been more stiff.  She has had no problems with weight loss or weight gain.  There is been no abdominal pain.  She has had no change in bowel or bladder habits.  She went to see Dr. Carollee Herter.  As always, [place] was very thorough with her evaluation.  She initially had some lumbar spine films done.  This was done in late September.  The x-rays of her lumbar spine show patchy sclerosis at T12-L3 and L5.  It was felt this was concerning for metastatic disease.  She had a hip x-ray also on 09/21/2022.  This showed possible irregular lucency involving the medial portion of the left acetabulum.  An MRI of the hip was then done.  This was done on 09/23/2022.  Shockingly, this showed widespread marrow replacing by cancer.  This involved her pelvis, bilateral femurs and lower lumbar spine.  There was a dominant lesion centered within the left acetabulum.  There was some cortical thinning and periosteal edema.  There is no pathologic fracture.  There were 2 adjacent lesions within the left femoral neck.  She had MRI of the lumbar spine on 09/27/2022.  Again, this showed widespread metastatic  disease throughout the thoracolumbar spine and upper bony pelvis.  There is no pathologic fractures.  There is no epidural tumor.  Based on this, she was, referred to the [place] for evaluation.  She has noted that she has had some elevated LFTs.  There is been no shortness of breath.  She had little bit of a dry cough.  Her appetite has been pretty good.  She has had no nausea or vomiting.  There is been no leg swelling.  She has little lymphedema of the right arm.  This is chronic.  She has had no fever.  There is been no sweats.  She does have discomfort in her back.  She is only taking Tylenol for this.  I think we will probably going to need some little bit stronger.  I will call in some tramadol for her.  Overall, I think she has a very good performance status.  I would say performance status is probably ECOG 1.    Past Medical History:  Diagnosis Date   Breast cancer ([place]) 1994   s/p chemo 8/94-1/95, radiation 8/94-11/94   Cataracts, bilateral    Factor 5 Leiden mutation, heterozygous [facility])    also has Factor 8 problems   Hypertension    Menopause    chemo induced  :   Past Surgical History:  Procedure Laterality Date  BREAST LUMPECTOMY Right 1994   w/ radiation and chemotherapy following lumpectomy   CHOLECYSTECTOMY  06/20/2002   Laparoscopic cholestectomy   varicose veins Bilateral 2013   laser surgery   Dr. Jones Skene  :   Current Outpatient Medications:    amLODipine (NORVASC) 2.5 MG tablet, TAKE 1 TABLET(2.5 MG) BY MOUTH DAILY, Disp: 90 tablet, Rfl: 1   aspirin 81 MG tablet, Take 81 mg by mouth daily., Disp: , Rfl:    atorvastatin (LIPITOR) 10 MG tablet, TAKE 1 TABLET(10 MG) BY MOUTH DAILY, Disp: 90 tablet, Rfl: 1   bisoprolol-hydrochlorothiazide (ZIAC) 10-6.25 MG tablet, TAKE 1 TABLET BY MOUTH DAILY, Disp: 90 tablet, Rfl: 1   cholecalciferol (VITAMIN D) 1000 units tablet, Take 2,000 Units by mouth daily., Disp: , Rfl:    Coenzyme Q10  200 MG capsule, Take 200 mg by mouth daily., Disp: , Rfl:    cyclobenzaprine (FLEXERIL) 10 MG tablet, Take 1 tablet (10 mg total) by mouth 3 (three) times daily as needed for muscle spasms., Disp: 30 tablet, Rfl: 0   hydrochlorothiazide (HYDRODIURIL) 25 MG tablet, TAKE 1 TABLET BY MOUTH EVERY DAY, Disp: 90 tablet, Rfl: 1   meclizine (ANTIVERT) 12.5 MG tablet, Take 1 tablet (12.5 mg total) by mouth 3 (three) times daily as needed for dizziness., Disp: 30 tablet, Rfl: 4   meloxicam (MOBIC) 7.5 MG tablet, Take 1 tablet (7.5 mg total) by mouth daily., Disp: 30 tablet, Rfl: 0   potassium chloride SA (KLOR-CON M) 20 MEQ tablet, TAKE 1 TABLET BY MOUTH DAILY, Disp: 90 tablet, Rfl: 1:  :  No Known Allergies:   Family History  Problem Relation Age of Onset   Cancer Mother        Bladder CA, colon   Colon cancer Mother    Stroke Mother    Hypertension Mother    Hyperlipidemia Mother    Hepatitis Mother        hep A   COPD Mother    Dementia Mother    Hypertension Father    Prostate cancer Father    Diabetes Father    Stroke Father    Pulmonary embolism Father    Deep vein thrombosis Father    Cancer Father        prostate cancer   Schizophrenia Brother    Mental illness Brother    COPD Brother    Hepatitis C Brother    Liver disease Other   :   Social History   Socioeconomic History   Marital status: Married    Spouse name: Not on file   Number of children: Not on file   Years of education: Not on file   Highest education level: Not on file  Occupational History   Occupation: retired Product manager: RETIRED  Tobacco Use   Smoking status: Never   Smokeless tobacco: Never  Substance and Sexual Activity   Alcohol use: No   Drug use: No   Sexual activity: Yes    Partners: Male  Other Topics Concern   Not on file  Social History Narrative   Exercise--- walking   Social Determinants of Health   Financial Resource Strain: Low Risk  (07/20/2021)   Overall Financial  Resource Strain (CARDIA)    Difficulty of Paying Living Expenses: Not hard at all  Food Insecurity: No Food Insecurity (07/07/2020)   Hunger Vital Sign    Worried About Running Out of Food in the Last Year: Never true    Ran Out  of Food in the Last Year: Never true  Transportation Needs: No Transportation Needs (07/20/2021)   PRAPARE - Hydrologist (Medical): No    Lack of Transportation (Non-Medical): No  Physical Activity: Sufficiently Active (07/20/2021)   Exercise Vital Sign    Days of Exercise per Week: 7 days    Minutes of Exercise per Session: 30 min  Stress: No Stress Concern Present (07/20/2021)   [place]    Feeling of Stress : Not at all  Social Connections: Moderately Isolated (07/20/2021)   Social Connection and Isolation Panel [NHANES]    Frequency of Communication with Friends and Family: More than three times a week    Frequency of Social Gatherings with Friends and Family: More than three times a week    Attends Religious Services: Never    Marine scientist or Organizations: No    Attends Archivist Meetings: Never    Marital Status: Married  Human resources officer Violence: Not At Risk (07/20/2021)   Humiliation, Afraid, Rape, and Kick questionnaire    Fear of Current or Ex-Partner: No    Emotionally Abused: No    Physically Abused: No    Sexually Abused: No  :  Review of Systems  Constitutional: Negative.   HENT: Negative.    Eyes: Negative.   Respiratory: Negative.    Cardiovascular: Negative.   Gastrointestinal: Negative.   Genitourinary: Negative.   Musculoskeletal:  Positive for back pain.  Skin: Negative.   Neurological: Negative.   Endo/Heme/Allergies: Negative.   Psychiatric/Behavioral: Negative.       Exam: Her vital signs show temperature of 99.4.  Pulse 85.  Blood pressure 117/58.  Weight is 187 pounds.   @IPVITALS @ Physical Exam Vitals  reviewed.  Constitutional:      Comments: Her breast exam shows left breast with no masses, edema or erythema.  There is no left axillary adenopathy.  Right breast significantly contracted from surgery and radiation therapy.  There is little bit of firmness noted on the medial aspect of the right breast.  There is no erythema.  There is no swelling.  There is no right axillary adenopathy.  HENT:     Head: Normocephalic and atraumatic.  Eyes:     Pupils: Pupils are equal, round, and reactive to light.  Cardiovascular:     Rate and Rhythm: Normal rate and regular rhythm.     Heart sounds: Normal heart sounds.  Pulmonary:     Effort: Pulmonary effort is normal.     Breath sounds: Normal breath sounds.  Abdominal:     General: Bowel sounds are normal.     Palpations: Abdomen is soft.  Musculoskeletal:        General: No tenderness or deformity. Normal range of motion.     Cervical back: Normal range of motion.     Comments: She has some tenderness to palpation in the lumbosacral region.  Lymphadenopathy:     Cervical: No cervical adenopathy.  Skin:    General: Skin is warm and dry.     Findings: No erythema or rash.  Neurological:     Mental Status: She is alert and oriented to person, place, and time.  Psychiatric:        Behavior: Behavior normal.        Thought Content: Thought content normal.        Judgment: Judgment normal.     Recent Labs  09/29/22 1102  WBC 7.3  HGB 13.6  HCT 41.4  PLT 348    Recent Labs    09/29/22 1102  NA 135  K 3.2*  CL 94*  CO2 32  GLUCOSE 93  BUN 14  CREATININE 0.99  CALCIUM 9.9    Blood smear review: None  Pathology: Pending    Assessment and Plan: Ms. Pallo is a very charming 71 year old white female.  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  She was treated in the adjuvant setting with chemotherapy and radiation therapy and tamoxifen.  I have to believe that what we are looking at is  recurrent disease.  We will have to see what her CA 27.29 looks like.  I believe that she is going need to have surgery for the left hip.  She has a impending pathologic fracture.  I would hate to see her have an actual fracture.  We will have to call orthopedic surgery and see if they cannot see her and set her up with a surgical repair.  It would be great to have pathology.  I think we get pathology from this pathologic fracture surgery.  This should give [place] enough material that we can send off for molecular analysis.  I do believe that her tumor is still ER/PR (+).  I think what can be critical is to know whether or not this is HER2 positive.  She is currently clearly need to have genetic testing.  Her initial cancer was found back when she was 71 years old.  I think would be helpful for [place] to have genetic testing.  I know she has no children but at least if she is BRCA positive, which I do not think so, then we can see about using some count targeted therapy.  She clearly is going to need to have a PET scan done.  We will have to see about getting a dedicated breast PET scan.  She will need to have Xgeva/Zometa.  She will need radiation therapy.  I will hold on giving her systemic therapy.  Again actively that this is going to be estrogen positive so we can utilize an aromatase inhibitor along with a CDK 4/6 agent.  I had a long talk with Ms. Ronan.  She is incredibly knowledgeable.  She knows that we can treat this but we cannot cure this.  I would like to hope that if we are just dealing with bone only disease, then we can hopefully give her several years of good quality of life.  We certainly have a lot of treatment options at our disposal.  This is somewhat complicated.  Again, the MRI certainly is suggestive of the femoral neck having tumor that would be at risk for fracture.  I will see about getting her back once we get all of our x-ray test done and hopefully after she has  surgery.

## 2022-09-30 ENCOUNTER — Encounter: Payer: Self-pay | Admitting: *Deleted

## 2022-09-30 ENCOUNTER — Telehealth: Payer: Self-pay | Admitting: Genetic Counselor

## 2022-09-30 ENCOUNTER — Other Ambulatory Visit: Payer: Self-pay | Admitting: Pharmacist

## 2022-09-30 DIAGNOSIS — C50919 Malignant neoplasm of unspecified site of unspecified female breast: Secondary | ICD-10-CM

## 2022-09-30 DIAGNOSIS — Z853 Personal history of malignant neoplasm of breast: Secondary | ICD-10-CM

## 2022-09-30 LAB — CANCER ANTIGEN 27.29: CA 27.29: 131.2 U/mL — ABNORMAL HIGH (ref 0.0–38.6)

## 2022-09-30 NOTE — Telephone Encounter (Signed)
Scheduled appt per 10/4 referral. Pt is aware of appt date and time. Pt is aware to arrive 15 mins prior to appt time and to bring and updated insurance card. Pt is aware of appt location.

## 2022-09-30 NOTE — Progress Notes (Signed)
Patient will need to following:  Genetics - appointment scheduled MRI Brain - scheduled for 10/05/22 PET - scheduled for 10/11/22 Referral to orthopedic surgery - Dr Marin Olp calling Dr Berenice Primas personally. Referral order placed.  Called patient and reviewed all appointments with her. She is aware of appointment date, times and locations. She confirmed teaching of PET prep with teach back. Radiology Information sheet also mailed to patient home.   Answered other patient questions to her satisfaction. She is aware that Dr Berenice Primas office will still reach out to her to schedule consultation.   Oncology Nurse Navigator Documentation     09/30/2022   10:45 AM  Oncology Nurse Navigator Flowsheets  Navigator Follow Up Date: 10/05/2022  Navigator Follow Up Reason: Scan Review  Navigator Location [facility]  Navigator Encounter Type Appt/Treatment Plan Review;Telephone  Telephone Education;Appt Confirmation/Clarification;Outgoing Call  Patient Visit Type MedOnc  Treatment Phase Abnormal Scans  Barriers/Navigation Needs Coordination of Care;Education  Education Pain/ Symptom Management;Preparing for Upcoming Surgery/ Treatment;Other  Interventions Coordination of Care;Education;Psycho-Social Support  Acuity Level 2-Minimal Needs (1-2 Barriers Identified)  Coordination of Care Radiology  Education Method Verbal;Written;Teach-back  Support Groups/Services Friends and Family  Time Spent with Patient 23

## 2022-10-04 ENCOUNTER — Inpatient Hospital Stay
Admission: RE | Admit: 2022-10-04 | Discharge: 2022-10-04 | Disposition: A | Discharge status: Home / Self Care | Payer: Self-pay | Source: Ambulatory Visit | Attending: Radiation Oncology | Admitting: Radiation Oncology

## 2022-10-04 ENCOUNTER — Other Ambulatory Visit: Payer: Self-pay | Admitting: Radiation Oncology

## 2022-10-04 DIAGNOSIS — C50919 Malignant neoplasm of unspecified site of unspecified female breast: Secondary | ICD-10-CM

## 2022-10-05 ENCOUNTER — Ambulatory Visit (HOSPITAL_COMMUNITY)
Admission: RE | Admit: 2022-10-05 | Discharge: 2022-10-05 | Disposition: A | Discharge status: Home / Self Care | Payer: Medicare PPO | Source: Ambulatory Visit | Attending: Hematology & Oncology | Admitting: Hematology & Oncology

## 2022-10-05 DIAGNOSIS — C50919 Malignant neoplasm of unspecified site of unspecified female breast: Secondary | ICD-10-CM | POA: Insufficient documentation

## 2022-10-05 DIAGNOSIS — C7951 Secondary malignant neoplasm of bone: Secondary | ICD-10-CM | POA: Diagnosis not present

## 2022-10-05 DIAGNOSIS — G9389 Other specified disorders of brain: Secondary | ICD-10-CM | POA: Diagnosis not present

## 2022-10-05 MED ORDER — GADOBUTROL 1 MMOL/ML IV SOLN
8.0000 mL | Freq: Once | INTRAVENOUS | Status: AC | PRN
  Administered 2022-10-05: 8 mL via INTRAVENOUS

## 2022-10-07 ENCOUNTER — Ambulatory Visit: Payer: Medicare PPO | Admitting: Dietician

## 2022-10-07 ENCOUNTER — Encounter: Payer: Self-pay | Admitting: *Deleted

## 2022-10-07 DIAGNOSIS — C50919 Malignant neoplasm of unspecified site of unspecified female breast: Secondary | ICD-10-CM

## 2022-10-07 DIAGNOSIS — Z853 Personal history of malignant neoplasm of breast: Secondary | ICD-10-CM

## 2022-10-07 NOTE — Addendum Note (Signed)
Addended by: Cordelia Poche on: 10/07/2022 08:38 AM   Modules accepted: Orders

## 2022-10-07 NOTE — Progress Notes (Addendum)
Tracy Napoleon, MD  P Onc Nurse Hp Call - the MRI of the brain looks ok!!!  Tracy Thompson   Called patient and notified her of results.   Spoke with patient regarding next steps and answered some of her questions. Will place a nutrition consult as patient feels like she's struggling with eating enough.   Oncology Nurse Navigator Documentation     10/07/2022    8:00 AM  Oncology Nurse Navigator Flowsheets  Navigator Follow Up Date: 10/12/2022  Navigator Follow Up Reason: Scan Review  Navigator Location [facility]  Navigator Encounter Type Scan Review;Telephone  Telephone Diagnostic Results;Outgoing Call  Patient Visit Type MedOnc  Treatment Phase Abnormal Scans  Barriers/Navigation Needs Coordination of Care;Education  Education Other  Interventions Education;Psycho-Social Support;Referrals  Acuity Level 2-Minimal Needs (1-2 Barriers Identified)  Referrals Nutrition/dietician  Education Method Verbal  Support Groups/Services Friends and Family  Time Spent with Patient 15

## 2022-10-07 NOTE — Progress Notes (Signed)
Nutrition Consult:  I called patient at home telephone number and spouse told me she was sick today and didn't feel like talking.  She allowed spouse to speak for her but then agreed to talk briefly. She states she feels lousy because she is constipated and has nausea.    We reviewed some general tips for helping with constipation.  She relayed that her usual PO fluid intake was Gold Tea 4-8 oz, Gingerale 12 oz, Water 8 oz, very little milk, no coffee    INTERVENTION:    Educated on importance of adequate fluids to aid stool softeners and laxatives to work more effectively.  Encouraged higher fiber foods for constipation.  Educated on role of soluble and insoluble fibers. Encouraged increasing fluids to goal 70-80 oz suggested hot herbal teas to aid with constipation.    Emailed Nutrition Tip sheet for  Nutrition During Cancer Treatments and Constipation Contact information provided   Next Visit: Remote Next Wednesday   Cyndi [place], RDN, LDN Registered Dietitian, [place] Part Time Remote (Usual office hours: Tuesday-Thursday) Mobile: [phone]

## 2022-10-11 ENCOUNTER — Encounter (HOSPITAL_COMMUNITY)
Admission: RE | Admit: 2022-10-11 | Discharge: 2022-10-11 | Disposition: A | Discharge status: Home / Self Care | Payer: Medicare PPO | Source: Ambulatory Visit | Attending: Hematology & Oncology | Admitting: Hematology & Oncology

## 2022-10-11 DIAGNOSIS — C7951 Secondary malignant neoplasm of bone: Secondary | ICD-10-CM | POA: Diagnosis not present

## 2022-10-11 DIAGNOSIS — Z853 Personal history of malignant neoplasm of breast: Secondary | ICD-10-CM | POA: Diagnosis not present

## 2022-10-11 DIAGNOSIS — C7981 Secondary malignant neoplasm of breast: Secondary | ICD-10-CM | POA: Diagnosis not present

## 2022-10-11 DIAGNOSIS — C50919 Malignant neoplasm of unspecified site of unspecified female breast: Secondary | ICD-10-CM | POA: Diagnosis not present

## 2022-10-11 DIAGNOSIS — C801 Malignant (primary) neoplasm, unspecified: Secondary | ICD-10-CM | POA: Diagnosis not present

## 2022-10-11 LAB — GLUCOSE, CAPILLARY: Glucose-Capillary: 117 mg/dL — ABNORMAL HIGH (ref 70–99)

## 2022-10-11 MED ORDER — FLUDEOXYGLUCOSE F - 18 (FDG) INJECTION
9.5000 | Freq: Once | INTRAVENOUS | Status: AC | PRN
  Administered 2022-10-11: 9.33 via INTRAVENOUS

## 2022-10-11 NOTE — Progress Notes (Incomplete)
Histology and Location of Primary Cancer: Metastatic breast cancer - right breast  Sites of Visceral and Bony Metastatic Disease: thoracolumbar spine, upper bony pelvis, bilateral femurs and lower lumbar spine.   Location(s) of Symptomatic Metastases: ***  Past/Anticipated chemotherapy by medical oncology, if any: Xgeva/Zometa   Pain on a scale of 0-10 is: {Number; 9-10  not applicable:20727}    If Spine Met(s), symptoms, if any, include: Bowel/Bladder retention or incontinence (please describe): *** Numbness or weakness in extremities (please describe): *** Current Decadron regimen, if applicable: none  Ambulatory status? Walker? Wheelchair?: {VQI Ambulatory Status:20974}  SAFETY ISSUES: Prior radiation? yes, right breast in 1994 Pacemaker/ICD? {:18581} Possible current pregnancy? no Is the patient on methotrexate? {:18581}  Current Complaints / other details:  ***

## 2022-10-12 ENCOUNTER — Other Ambulatory Visit: Payer: Self-pay | Admitting: Orthopedic Surgery

## 2022-10-12 ENCOUNTER — Ambulatory Visit: Payer: Medicare PPO

## 2022-10-12 ENCOUNTER — Ambulatory Visit
Admission: RE | Admit: 2022-10-12 | Discharge: 2022-10-12 | Disposition: A | Discharge status: Home / Self Care | Payer: Medicare PPO | Source: Ambulatory Visit | Attending: Orthopedic Surgery | Admitting: Orthopedic Surgery

## 2022-10-12 ENCOUNTER — Ambulatory Visit: Admission: RE | Admit: 2022-10-12 | Payer: Medicare PPO | Source: Ambulatory Visit

## 2022-10-12 DIAGNOSIS — M25551 Pain in right hip: Secondary | ICD-10-CM | POA: Diagnosis not present

## 2022-10-12 DIAGNOSIS — M25552 Pain in left hip: Secondary | ICD-10-CM | POA: Diagnosis not present

## 2022-10-12 DIAGNOSIS — M79605 Pain in left leg: Secondary | ICD-10-CM | POA: Diagnosis not present

## 2022-10-12 DIAGNOSIS — C7951 Secondary malignant neoplasm of bone: Secondary | ICD-10-CM | POA: Diagnosis not present

## 2022-10-13 ENCOUNTER — Ambulatory Visit: Payer: Medicare PPO | Admitting: Dietician

## 2022-10-13 ENCOUNTER — Encounter: Payer: Self-pay | Admitting: *Deleted

## 2022-10-13 NOTE — Progress Notes (Signed)
Reviewed PET scan with Dr Marin Olp. No change to treatment plan. Awaiting surgical planning and biopsy to determine follow up treatment strategy.  Patient saw orthopedics yesterday and surgical planning is in process. She sees Radiation Oncology tomorrow.   Oncology Nurse Navigator Documentation     10/13/2022    8:30 AM  Oncology Nurse Navigator Flowsheets  Navigator Follow Up Date: 10/15/2022  Navigator Follow Up Reason: Appointment Review  Navigator Location [facility]  Navigator Encounter Type Scan Review  Patient Visit Type MedOnc  Treatment Phase Abnormal Scans  Barriers/Navigation Needs Coordination of Care;Education  Interventions None Required  Acuity Level 2-Minimal Needs (1-2 Barriers Identified)  Support Groups/Services Friends and Family  Time Spent with Patient 15

## 2022-10-13 NOTE — Progress Notes (Signed)
Nutrition Follow Up: Called patient at home telephone #, she had spouse with her who is also sick and having bowel concerns so he was listening in.    ASSESSMENT: She reports she is using dulcolax now colace not working.  She will be meeting with radiation oncologist tomorrow.  Her sister in law is coming in to help with her and spouse " for as long as she's needed." However, she is 45 YOF.  Neighbors are also helping with food.   She has more fruit in home now, sweeties, Banana, pears  She has increased fluids and started using hot chocolate to help with constipation, Gingerale for nausea  Cooked apples, beef stew   HP Ensure at lunch now   Anthropometrics: 15# (7.3%) weight loss is six months, she reports weight loss started in summer months  Height: 66" Weight:   09/29/22  187# 09/20/22  188.4# 03/18/22  203 UBW: 200 BMI: 30.18  INTERVENTION:   Discussed ways to add calories/protein to foods (making hot chocolate with whole milk)   Encouraged small frequent feeds and trying to eat 6 small meals  Reviewed good sources of potassium from foods (likes pintos, black beans has in cupboard) Reviewed strategies for sick days and encouraged use of frozen meals on those days and cooking and stocking up when feeling better. Encouraged used of calendar in bathroom to stay on top of bowel regularity Emailed Nutrition Tip sheet  for  High Potassium foods Contact information provided   MONITORING, EVALUATION, GOAL: weight, PO intake, Nutrition Impact Symptoms, labs Goal is weight maintenance  Next Visit: November 1st, remote schedule around any other appointments   [name], RDN, LDN Registered Dietitian, [place] Part Time Remote (Usual office hours: Tuesday-Thursday) Cell: [phone]

## 2022-10-13 NOTE — Progress Notes (Signed)
Radiation Oncology         [phone] ________________________________  Initial Outpatient Consultation  Name: Tracy Thompson MRN: [id number]  Date: 10/14/2022  DOB: July 04, 1951  HB:ZJIRC Chase, Alferd Apa, DO  Ennever, Rudell Cobb, MD   REFERRING PHYSICIAN: Volanda Napoleon, MD  DIAGNOSIS: There were no encounter diagnoses.  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 Sites of osseous metastases include the thoracolumbar spine, upper bony pelvis, bilateral femurs, and lower lumbar spine   HISTORY OF PRESENT ILLNESS::Tracy Thompson is a 71 y.o. female who is accompanied by ***. she is seen as a courtesy of Dr. Marin Olp for an opinion concerning radiation therapy as part of management for her recently diagnosed osseous metastatic disease from right breast cancer primary. The patient was initially diagnosed with ER/PR positive right breast cancer in 1994 while living in [place]. She received 6 cycles of chemotherapy consisting of CMF followed by radiation and tamoxifen.     Recently, the patient presented to her PCP, Dr. Carollee Herter, on 09/21/22 with reports of recent worsening left hip pain which initially began several years ago, and radiating pain to her left leg with associated weakness. She also endorsed decreased energy (feeling tired all the time).   X-ray of the lumbar spine performed for evaluation on 09/21/22 showed patchy sclerosis involving T12, L3, and L5 concerning for age indeterminate metastatic disease. X-ray of the left pelvis also performed showed possible irregular lucency involving the medial portion of the left acetabulum concerning for possible lytic or destructive process.   MRI of the left hip without contrast on 09/23/22 showed widespread marrow replacing bone lesions throughout the bony pelvis, bilateral femurs, and visualized lower lumbar spine compatible with osseous metastatic disease. The largest  lesion appeared to be centered within the left acetabulum, causing cortical thinning and periosteal edema. MRI also showed: two adjacent lesions within the left femoral neck at risk for pathologic fracture; and a lesion occupying the majority of the L5 vertebral body with slight irregularity along the superior endplate, possibly reflecting a pathologic fracture.  MRI of the thoracic and lumbar spine without contrast on 09/27/22 showed widespread osseous metastatic disease throughout the thoracolumbar spine and visualized upper bony pelvis. MRI otherwise showed no definite pathologic fracture, osseous retropulsion or epidural tumor.   MRI of the brain with and without contrast on 10/05/22 showed no evidence of intracranial metastatic disease. However, extensive osseous metastatic disease of the calvarium, clivus and upper cervical spine was appreciated. Calvarial involvement appeared to be the most extensive in both frontal convexity regions, particularly on the right. Evidence of early dural extension of tumor was also appreciated.   Accordingly, the patient was referred to Dr. Marin Olp on 09/29/22 who recommended proceeding with a PET scan followed by zometa/xgeva and radiation therapy. In terms of surgical options, Dr. Marin Olp recommended surgery to the left hip in management of the pathologic fracture, and to provide sampling for molecular / prognostic studies.   PET scan performed on 10/13/22 showed: hypermetabolic right hilar in mediastinal lymphadenopathy consistent with metastatic disease, widespread osseous metastatic involvement, and a small pericardial effusion. PET otherwise showed no evidence of metastatic disease within the abdomen or pelvis.   Other pertinent imaging includes her most recent screening mammogram on 09/01/22 at [facility] which showed no mammographic evidence of malignancy in either breast. She also had a bone density study performed on that same date which showed a statistically  significant increase in bone  mineral density of the left hip and AP spine.    PREVIOUS RADIATION THERAPY: Yes, in 1994 for primary right breast cancer while living in [place].   PAST MEDICAL HISTORY:  Past Medical History:  Diagnosis Date   Breast cancer ([place]) 1994   s/p chemo 8/94-1/95, radiation 8/94-11/94   Breast cancer metastasized to bone ([place]) 09/29/2022   Cataracts, bilateral    Factor 5 Leiden mutation, heterozygous ([place])    also has Factor 8 problems   Hypertension    Menopause    chemo induced    PAST SURGICAL HISTORY: Past Surgical History:  Procedure Laterality Date   BREAST LUMPECTOMY Right 1994   w/ radiation and chemotherapy following lumpectomy   CHOLECYSTECTOMY  06/20/2002   Laparoscopic cholestectomy   varicose veins Bilateral 2013   laser surgery   Dr. Jones Skene    FAMILY HISTORY:  Family History  Problem Relation Age of Onset   Cancer Mother        Bladder CA, colon   Colon cancer Mother    Stroke Mother    Hypertension Mother    Hyperlipidemia Mother    Hepatitis Mother        hep A   COPD Mother    Dementia Mother    Hypertension Father    Prostate cancer Father    Diabetes Father    Stroke Father    Pulmonary embolism Father    Deep vein thrombosis Father    Cancer Father        prostate cancer   Schizophrenia Brother    Mental illness Brother    COPD Brother    Hepatitis C Brother    Liver disease Other     SOCIAL HISTORY:  Social History   Tobacco Use   Smoking status: Never   Smokeless tobacco: Never  Substance Use Topics   Alcohol use: No   Drug use: No    ALLERGIES: No Known Allergies  MEDICATIONS:  Current Outpatient Medications  Medication Sig Dispense Refill   amLODipine (NORVASC) 2.5 MG tablet TAKE 1 TABLET(2.5 MG) BY MOUTH DAILY 90 tablet 1   aspirin 81 MG tablet Take 81 mg by mouth daily.     atorvastatin (LIPITOR) 10 MG tablet TAKE 1 TABLET(10 MG) BY MOUTH DAILY 90 tablet 1   bisoprolol-hydrochlorothiazide  (ZIAC) 10-6.25 MG tablet TAKE 1 TABLET BY MOUTH DAILY 90 tablet 1   cholecalciferol (VITAMIN D) 1000 units tablet Take 2,000 Units by mouth daily.     Coenzyme Q10 200 MG capsule Take 200 mg by mouth daily.     cyclobenzaprine (FLEXERIL) 10 MG tablet Take 1 tablet (10 mg total) by mouth 3 (three) times daily as needed for muscle spasms. 30 tablet 0   hydrochlorothiazide (HYDRODIURIL) 25 MG tablet TAKE 1 TABLET BY MOUTH EVERY DAY 90 tablet 1   meclizine (ANTIVERT) 12.5 MG tablet Take 1 tablet (12.5 mg total) by mouth 3 (three) times daily as needed for dizziness. 30 tablet 4   meloxicam (MOBIC) 7.5 MG tablet Take 1 tablet (7.5 mg total) by mouth daily. 30 tablet 0   potassium chloride SA (KLOR-CON M) 20 MEQ tablet TAKE 1 TABLET BY MOUTH DAILY 90 tablet 1   traMADol (ULTRAM) 50 MG tablet Take 1 tablet (50 mg total) by mouth every 6 (six) hours as needed. 90 tablet 0   No current facility-administered medications for this encounter.    REVIEW OF SYSTEMS:  A 10+ POINT REVIEW OF SYSTEMS WAS OBTAINED including neurology, dermatology,  psychiatry, cardiac, respiratory, lymph, extremities, GI, GU, musculoskeletal, constitutional, reproductive, HEENT. ***   PHYSICAL EXAM:  vitals were not taken for this visit.   General: Alert and oriented, in no acute distress HEENT: Head is normocephalic. Extraocular movements are intact. Oropharynx is clear. Neck: Neck is supple, no palpable cervical or supraclavicular lymphadenopathy. Heart: Regular in rate and rhythm with no murmurs, rubs, or gallops. Chest: Clear to auscultation bilaterally, with no rhonchi, wheezes, or rales. Abdomen: Soft, nontender, nondistended, with no rigidity or guarding. Extremities: No cyanosis or edema. Lymphatics: see Neck Exam Skin: No concerning lesions. Musculoskeletal: symmetric strength and muscle tone throughout. Neurologic: Cranial nerves II through XII are grossly intact. No obvious focalities. Speech is fluent. Coordination  is intact. Psychiatric: Judgment and insight are intact. Affect is appropriate.  Left Breast: no palpable mass, nipple discharge or bleeding. Right Breast: no palpable mass, nipple discharge or bleeding.   ECOG = ***  0 - Asymptomatic (Fully active, able to carry on all predisease activities without restriction)  1 - Symptomatic but completely ambulatory (Restricted in physically strenuous activity but ambulatory and able to carry out work of a light or sedentary nature. For example, light housework, office work)  2 - Symptomatic, <50% in bed during the day (Ambulatory and capable of all self care but unable to carry out any work activities. Up and about more than 50% of waking hours)  3 - Symptomatic, >50% in bed, but not bedbound (Capable of only limited self-care, confined to bed or chair 50% or more of waking hours)  4 - Bedbound (Completely disabled. Cannot carry on any self-care. Totally confined to bed or chair)  5 - Death   Eustace Pen MM, Creech RH, Tormey DC, et al. [phone]). "Toxicity and response criteria of the [facility]". [place] Oncol. 5 (6): 649-55  LABORATORY DATA:  Lab Results  Component Value Date   WBC 7.3 09/29/2022   HGB 13.6 09/29/2022   HCT 41.4 09/29/2022   MCV 88.5 09/29/2022   PLT 348 09/29/2022   NEUTROABS 4.6 09/29/2022   Lab Results  Component Value Date   NA 135 09/29/2022   K 3.2 (L) 09/29/2022   CL 94 (L) 09/29/2022   CO2 32 09/29/2022   GLUCOSE 93 09/29/2022   BUN 14 09/29/2022   CREATININE 0.99 09/29/2022   CALCIUM 9.9 09/29/2022      RADIOGRAPHY: NM PET Image Initial (PI) Skull Base To Thigh  Result Date: 10/13/2022 CLINICAL DATA:  Initial treatment strategy for metastatic breast cancer. EXAM: NUCLEAR MEDICINE PET SKULL BASE TO THIGH TECHNIQUE: 9.3 mCi F-18 FDG was injected intravenously. Full-ring PET imaging was performed from the skull base to thigh after the radiotracer. CT data was obtained and used for  attenuation correction and anatomic localization. Fasting blood glucose: 117 mg/dl COMPARISON:  None Available. FINDINGS: Mediastinal blood pool activity: SUV max 3.2 Liver activity: SUV max NA NECK: No hypermetabolic lymph nodes in the neck. Incidental CT findings: None. CHEST: Hypermetabolic lymphadenopathy is identified in the right hilum, not well seen on non contrast CT imaging. Anterior hypermetabolic node demonstrates SUV max = 5.9. Central right hilar/subcarinal node demonstrates SUV max = 4.8. Anterior right axillary node demonstrates SUV max = 8.9. Incidental CT findings: Small pericardial effusion. Coronary artery calcification is evident. Mild atherosclerotic calcification is noted in the wall of the thoracic aorta. Scattered tiny (less than 4 mm) bilateral pulmonary nodules identified (see right upper lobe image 27/7, left lower lobe on 36/7, left lower  lobe on 38/7, and right lower lobe on 38/7). No focal airspace consolidation. No pleural effusion. ABDOMEN/PELVIS: No abnormal hypermetabolic activity within the liver, pancreas, adrenal glands, or spleen. No hypermetabolic lymph nodes in the abdomen or pelvis. Incidental CT findings: None. SKELETON: Diffuse hypermetabolic skeletal metastases identified involving skull, cervical spine, upper extremities, thoracolumbar spine, ribs, sternum, bony pelvis, and sacrum as well as lower extremities. Lesion in the left ischial tuberosity demonstrates SUV max = 11.7. Lesion in the right sacral a La demonstrates SUV max = 8.0. Incidental CT findings: None. IMPRESSION: 1. Hypermetabolic right hilar in mediastinal lymphadenopathy consistent with metastatic disease. 2. Widespread osseous metastatic involvement. 3. No evidence for hypermetabolic metastases in the abdomen or pelvis. 4.  Aortic Atherosclerois (ICD10-170.0) 5. Small pericardial effusion. Electronically Signed   By: Misty Stanley M.D.   On: 10/13/2022 06:51   MR Brain W Wo Contrast  Result Date:  10/06/2022 CLINICAL DATA:  Metastatic breast cancer.  Staging. EXAM: MRI HEAD WITHOUT AND WITH CONTRAST TECHNIQUE: Multiplanar, multiecho pulse sequences of the brain and surrounding structures were obtained without and with intravenous contrast. CONTRAST:  72mL GADAVIST GADOBUTROL 1 MMOL/ML IV SOLN COMPARISON:  Head CT 10/16/2008 FINDINGS: Brain: Diffusion imaging does not show any acute or subacute infarction. Minimal chronic small-vessel ischemic change affects the pons. No focal cerebellar finding. Few scattered nonenhancing foci of FLAIR and T2 signal within the hemispheric white matter likely indicates minimal chronic small vessel ischemic change. No evidence brain parenchymal or leptomeningeal disease. See below regarding osseous metastatic disease. No hydrocephalus. No extra-axial fluid collection. Vascular: Major vessels at the base of the brain show flow. Skull and upper cervical spine: Numerous metastatic lesions of the upper cervical spine, clivus and calvarium. Calvarial involvement is most extensive in both frontal convexity regions. Particularly on the right, there could be early dural extension of tumor. Sinuses/Orbits: Clear/normal Other: None IMPRESSION: 1. No evidence of brain parenchymal metastatic disease. No evidence of leptomeningeal tumor. 2. Extensive osseous metastatic disease of the calvarium, clivus and upper cervical spine. Calvarial involvement is most extensive in both frontal convexity regions. Particularly on the right, there could be early dural extension of tumor. 3. Minimal chronic small-vessel ischemic change of the pons and cerebral hemispheric white matter. Electronically Signed   By: Nelson Chimes M.D.   On: 10/06/2022 18:43   MR Lumbar Spine Wo Contrast  Result Date: 09/27/2022 CLINICAL DATA:  Back pain with sclerotic lesions on radiographs. History of metastatic breast cancer. EXAM: MRI THORACIC AND LUMBAR SPINE WITHOUT CONTRAST TECHNIQUE: Multiplanar and multiecho pulse  sequences of the thoracic and lumbar spine were obtained without intravenous contrast. COMPARISON:  Lumbar spine radiographs 09/21/2022. MRI of the left hip 09/23/2022. FINDINGS: MRI THORACIC SPINE FINDINGS Alignment: Convex right thoracic scoliosis. The lateral alignment is anatomic. Vertebrae: Widespread osseous metastatic disease throughout the cervicothoracic spine. Near complete replacement of the normal marrow signal within the T1, T4, T8 and T12 vertebral bodies. No definite pathologic fracture, osseous retropulsion or epidural tumor identified. Cord:  The thoracic cord appears normal in signal and caliber. Paraspinal and other soft tissues: No significant paraspinal abnormalities. Disc levels: Mild multilevel spondylosis. At C6-7, there is disc bulging contributing to moderate left foraminal narrowing, only imaged in the sagittal plane. Small thoracic disc protrusions are present on the right at T6-7 and on the left at T8-9. No cord deformity or high-grade foraminal narrowing. MRI LUMBAR SPINE FINDINGS Segmentation: Conventional anatomy assumed, with the last open disc space designated L5-S1. Alignment:  Mild  convex left scoliosis. Vertebrae: Widespread osseous metastatic disease throughout the lumbar spine and visualized upper bony pelvis. Near complete replacement of the normal marrow signal within the T12, L3 and L5 vertebral bodies. No definite pathologic fracture, osseous retropulsion or epidural tumor. Conus medullaris: Extends to the L1 level and appears normal. Paraspinal and other soft tissues: No significant paraspinal findings. Disc levels: L1-2: No significant findings. L2-3: Mild disc bulging and bilateral facet hypertrophy. Mild left foraminal narrowing. L3-4: Mild disc bulging and bilateral facet hypertrophy. Mild left foraminal narrowing. L4-5: Mild disc bulging with moderate bilateral facet hypertrophy. No significant spinal stenosis or nerve root encroachment. L5-S1: Preserved disc height.  Mild bilateral facet hypertrophy. No spinal stenosis or nerve root encroachment. IMPRESSION: 1. Widespread osseous metastatic disease throughout the thoracolumbar spine and visualized upper bony pelvis. No definite pathologic fracture, osseous retropulsion or epidural tumor identified. 2. Mild thoracic and lumbar spondylosis as described. No significant spinal stenosis or nerve root encroachment. Electronically Signed   By: Richardean Sale M.D.   On: 09/27/2022 15:16   MR Thoracic Spine Wo Contrast  Result Date: 09/27/2022 CLINICAL DATA:  Back pain with sclerotic lesions on radiographs. History of metastatic breast cancer. EXAM: MRI THORACIC AND LUMBAR SPINE WITHOUT CONTRAST TECHNIQUE: Multiplanar and multiecho pulse sequences of the thoracic and lumbar spine were obtained without intravenous contrast. COMPARISON:  Lumbar spine radiographs 09/21/2022. MRI of the left hip 09/23/2022. FINDINGS: MRI THORACIC SPINE FINDINGS Alignment: Convex right thoracic scoliosis. The lateral alignment is anatomic. Vertebrae: Widespread osseous metastatic disease throughout the cervicothoracic spine. Near complete replacement of the normal marrow signal within the T1, T4, T8 and T12 vertebral bodies. No definite pathologic fracture, osseous retropulsion or epidural tumor identified. Cord:  The thoracic cord appears normal in signal and caliber. Paraspinal and other soft tissues: No significant paraspinal abnormalities. Disc levels: Mild multilevel spondylosis. At C6-7, there is disc bulging contributing to moderate left foraminal narrowing, only imaged in the sagittal plane. Small thoracic disc protrusions are present on the right at T6-7 and on the left at T8-9. No cord deformity or high-grade foraminal narrowing. MRI LUMBAR SPINE FINDINGS Segmentation: Conventional anatomy assumed, with the last open disc space designated L5-S1. Alignment:  Mild convex left scoliosis. Vertebrae: Widespread osseous metastatic disease throughout  the lumbar spine and visualized upper bony pelvis. Near complete replacement of the normal marrow signal within the T12, L3 and L5 vertebral bodies. No definite pathologic fracture, osseous retropulsion or epidural tumor. Conus medullaris: Extends to the L1 level and appears normal. Paraspinal and other soft tissues: No significant paraspinal findings. Disc levels: L1-2: No significant findings. L2-3: Mild disc bulging and bilateral facet hypertrophy. Mild left foraminal narrowing. L3-4: Mild disc bulging and bilateral facet hypertrophy. Mild left foraminal narrowing. L4-5: Mild disc bulging with moderate bilateral facet hypertrophy. No significant spinal stenosis or nerve root encroachment. L5-S1: Preserved disc height. Mild bilateral facet hypertrophy. No spinal stenosis or nerve root encroachment. IMPRESSION: 1. Widespread osseous metastatic disease throughout the thoracolumbar spine and visualized upper bony pelvis. No definite pathologic fracture, osseous retropulsion or epidural tumor identified. 2. Mild thoracic and lumbar spondylosis as described. No significant spinal stenosis or nerve root encroachment. Electronically Signed   By: Richardean Sale M.D.   On: 09/27/2022 15:16   MR HIP LEFT WO CONTRAST  Result Date: 09/23/2022 CLINICAL DATA:  Left hip pain.  History of breast cancer EXAM: MR OF THE LEFT HIP WITHOUT CONTRAST TECHNIQUE: Multiplanar, multisequence MR imaging was performed. No intravenous contrast was administered.  COMPARISON:  X-ray 09/21/2022 FINDINGS: Bones/Joint/Cartilage Widespread marrow replacing bone lesions throughout the bony pelvis, bilateral femurs, and imaged lower lumbar spine. Dominant lesion visualized is a large slightly expansile lesion centered within the left acetabulum with cortical thinning and periosteal edema. No well-defined pathologic fracture is seen through this lesion. Lesion occupying the majority of the L5 vertebral body, seen only by coronal sequences with  slight irregularity along the superior endplate, which could reflect a pathologic fracture (series 13, image 16). Focal lesions within the bilateral femoral necks, left greater than right. Two adjacent lesions within the left femoral neck are at risk for site of pathologic fracture. There is periosteal edema associated with numerous lesions. Bony pelvis intact without diastasis. No hip joint dislocation. Small left hip joint effusion. Ligaments Grossly intact. Muscles and Tendons Tendinosis of the bilateral hamstring tendon origins. There is intramuscular edema within the left adductor magnus muscle, likely reactive or representing a muscle strain. A impending pathologic fracture of the left inferior pubic ramus is not excluded. Soft tissues No definite extraosseous soft tissue mass. No pelvic or inguinal lymphadenopathy is evident. Scattered colonic diverticulosis. IMPRESSION: 1. Widespread marrow replacing bone lesions throughout the bony pelvis, bilateral femurs, and imaged lower lumbar spine compatible with osseous metastatic disease. Dominant lesion is a large slightly expansile lesion centered within the left acetabulum with cortical thinning and periosteal edema. No well-defined pathologic fracture is seen through this lesion. 2. Two adjacent lesions within the left femoral neck, at risk for pathologic fracture. 3. Lesion occupying the majority of the L5 vertebral body with slight irregularity along the superior endplate, which could reflect a pathologic fracture. Consider dedicated MRI of the lumbar spine for further assessment. 4. Intramuscular edema within the left adductor magnus muscle, likely reactive or representing a muscle strain. A impending pathologic fracture of the left inferior pubic ramus is not excluded. These results will be called to the ordering clinician or representative by the Radiologist Assistant, and communication documented in the PACS or [employer]. Electronically Signed   By:  Davina Poke D.O.   On: 09/23/2022 19:22   DG Hip Unilat W OR W/O Pelvis Min 4 Views Left  Result Date: 09/22/2022 CLINICAL DATA:  Chronic left hip pain. EXAM: DG HIP (WITH OR WITHOUT PELVIS) 4+V LEFT COMPARISON:  None Available. FINDINGS: There is no evidence of hip fracture or dislocation. There is noted irregular lucency involving the medial portion of the left acetabulum concerning for possible destructive process. IMPRESSION: Possible irregular lucency is seen involving the medial portion of the left acetabulum concerning for possible lytic or destructive process. Further evaluation with CT or MRI scan is recommended. These results will be called to the ordering clinician or representative by the Radiologist Assistant, and communication documented in the PACS or [employer]. Electronically Signed   By: Marijo Conception M.D.   On: 09/22/2022 09:23   DG Lumbar Spine Complete  Result Date: 09/22/2022 CLINICAL DATA:  Lumbar back pain EXAM: LUMBAR SPINE - COMPLETE 4+ VIEW COMPARISON:  None Available. FINDINGS: No acute fracture or traumatic malalignment. Patchy sclerosis in T12 and L5 with near-complete sclerosis within L3 concerning for metastases. Mild multilevel spondylosis. Disc space height is maintained. Mild lower lumbar facet arthropathy. IMPRESSION: Patchy sclerosis in T12 L3, and L5 concerning for age indeterminate metastases. These may be chronic however no comparison is available. These results will be called to the ordering clinician or representative by the Radiologist Assistant, and communication documented in the PACS or [employer].  Electronically Signed   By: Placido Sou M.D.   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 Sites of osseous metastases include the thoracolumbar spine, upper bony pelvis, bilateral femurs, and lower lumbar spine   ***  Today, I talked  to the patient and family about the findings and work-up thus far.  We discussed the natural history of *** and general treatment, highlighting the role of radiotherapy in the management.  We discussed the available radiation techniques, and focused on the details of logistics and delivery.  We reviewed the anticipated acute and late sequelae associated with radiation in this setting.  The patient was encouraged to ask questions that I answered to the best of my ability. *** A patient consent form was discussed and signed.  We retained a copy for our records.  The patient would like to proceed with radiation and will be scheduled for CT simulation.  PLAN: ***    *** minutes of total time was spent for this patient encounter, including preparation, face-to-face counseling with the patient and coordination of care, physical exam, and documentation of the encounter.   ------------------------------------------------  Blair Promise, PhD, MD  This document serves as a record of services personally performed by Gery Pray, MD. It was created on his behalf by Roney Mans, a trained medical scribe. The creation of this record is based on the scribe's personal observations and the provider's statements to them. This document has been checked and approved by the attending provider.

## 2022-10-14 ENCOUNTER — Ambulatory Visit
Admission: RE | Admit: 2022-10-14 | Discharge: 2022-10-14 | Disposition: A | Discharge status: Home / Self Care | Payer: Medicare PPO | Source: Ambulatory Visit | Attending: Radiation Oncology | Admitting: Radiation Oncology

## 2022-10-14 ENCOUNTER — Encounter: Payer: Self-pay | Admitting: Radiation Oncology

## 2022-10-14 VITALS — BP 139/78 | HR 89 | Temp 98.4°F | Resp 18 | Ht 66.0 in | Wt 180.2 lb

## 2022-10-14 DIAGNOSIS — Z8042 Family history of malignant neoplasm of prostate: Secondary | ICD-10-CM | POA: Insufficient documentation

## 2022-10-14 DIAGNOSIS — Z79899 Other long term (current) drug therapy: Secondary | ICD-10-CM | POA: Insufficient documentation

## 2022-10-14 DIAGNOSIS — M5136 Other intervertebral disc degeneration, lumbar region: Secondary | ICD-10-CM | POA: Diagnosis not present

## 2022-10-14 DIAGNOSIS — C7951 Secondary malignant neoplasm of bone: Secondary | ICD-10-CM | POA: Diagnosis not present

## 2022-10-14 DIAGNOSIS — I3139 Other pericardial effusion (noninflammatory): Secondary | ICD-10-CM | POA: Diagnosis not present

## 2022-10-14 DIAGNOSIS — Z923 Personal history of irradiation: Secondary | ICD-10-CM | POA: Insufficient documentation

## 2022-10-14 DIAGNOSIS — D6851 Activated protein C resistance: Secondary | ICD-10-CM | POA: Insufficient documentation

## 2022-10-14 DIAGNOSIS — C50911 Malignant neoplasm of unspecified site of right female breast: Secondary | ICD-10-CM | POA: Diagnosis not present

## 2022-10-14 DIAGNOSIS — I10 Essential (primary) hypertension: Secondary | ICD-10-CM | POA: Diagnosis not present

## 2022-10-14 DIAGNOSIS — Z800 Family history of malignant neoplasm of digestive organs: Secondary | ICD-10-CM | POA: Diagnosis not present

## 2022-10-14 DIAGNOSIS — Z791 Long term (current) use of non-steroidal anti-inflammatories (NSAID): Secondary | ICD-10-CM | POA: Diagnosis not present

## 2022-10-14 DIAGNOSIS — M50223 Other cervical disc displacement at C6-C7 level: Secondary | ICD-10-CM | POA: Diagnosis not present

## 2022-10-14 DIAGNOSIS — M47816 Spondylosis without myelopathy or radiculopathy, lumbar region: Secondary | ICD-10-CM | POA: Diagnosis not present

## 2022-10-14 DIAGNOSIS — Z170 Estrogen receptor positive status [ER+]: Secondary | ICD-10-CM | POA: Insufficient documentation

## 2022-10-14 DIAGNOSIS — Z79810 Long term (current) use of selective estrogen receptor modulators (SERMs): Secondary | ICD-10-CM | POA: Diagnosis not present

## 2022-10-14 DIAGNOSIS — Z9221 Personal history of antineoplastic chemotherapy: Secondary | ICD-10-CM | POA: Diagnosis not present

## 2022-10-14 DIAGNOSIS — C50919 Malignant neoplasm of unspecified site of unspecified female breast: Secondary | ICD-10-CM

## 2022-10-14 DIAGNOSIS — Z7982 Long term (current) use of aspirin: Secondary | ICD-10-CM | POA: Insufficient documentation

## 2022-10-14 DIAGNOSIS — Z8052 Family history of malignant neoplasm of bladder: Secondary | ICD-10-CM | POA: Diagnosis not present

## 2022-10-21 ENCOUNTER — Telehealth: Payer: Self-pay | Admitting: Oncology

## 2022-10-21 NOTE — Telephone Encounter (Signed)
Called [name] about possible surgery.  She has an appointment scheduled with Dr. Berenice Primas on Monday, 10/25/22 to discuss whether surgery can be done.  She is going to call on Monday after the appointment to let us know the plan.

## 2022-10-25 DIAGNOSIS — M79662 Pain in left lower leg: Secondary | ICD-10-CM | POA: Diagnosis not present

## 2022-10-25 DIAGNOSIS — M25552 Pain in left hip: Secondary | ICD-10-CM | POA: Diagnosis not present

## 2022-10-25 NOTE — Telephone Encounter (Signed)
Tracy Thompson called back and said Dr. Berenice Primas is leaning towards not doing surgery on her left hip or femur.  He does want to consult with 2 hip specialists and also will call Dr. Sondra Come today or tomorrow regarding his final decision about surgery.  Tracy Thompson requested a call back on the final decision.

## 2022-10-26 ENCOUNTER — Other Ambulatory Visit: Payer: Self-pay | Admitting: Genetic Counselor

## 2022-10-26 ENCOUNTER — Encounter: Payer: Self-pay | Admitting: *Deleted

## 2022-10-26 DIAGNOSIS — Z853 Personal history of malignant neoplasm of breast: Secondary | ICD-10-CM

## 2022-10-26 DIAGNOSIS — C7951 Secondary malignant neoplasm of bone: Secondary | ICD-10-CM | POA: Diagnosis not present

## 2022-10-26 DIAGNOSIS — C50911 Malignant neoplasm of unspecified site of right female breast: Secondary | ICD-10-CM | POA: Diagnosis not present

## 2022-10-26 DIAGNOSIS — Z170 Estrogen receptor positive status [ER+]: Secondary | ICD-10-CM | POA: Diagnosis not present

## 2022-10-26 NOTE — Progress Notes (Signed)
Called and spoke to patient about her surgical plan. She stated the orthopedist called today and has decided against surgery at this time. Patient will proceed to radiation.   Reviewed with Dr Ennever. With no surgery, there will be no biopsy, which Dr Ennever was waiting on. He requests that she come in this week for labs, including a liquid biopsy, and discussion with him about next steps.   Spoke to genetics. She is coming in Thursday for lab work, and she has a genetics lab appointment Thursday afternoon. We will also draw the genetic kit for them to reduce patients venipuncture and appointments.   Called patient and spoke with her. She is aware of appointment date, time and location.   Oncology Nurse Navigator Documentation     10/26/2022    2:30 PM  Oncology Nurse Navigator Flowsheets  Navigator Follow Up Date: 10/28/2022  Navigator Follow Up Reason: Follow-up Appointment  Navigator Location [facility]  Navigator Encounter Type Appt/Treatment Plan Review;Telephone  Telephone Appt Confirmation/Clarification;Outgoing Call  Patient Visit Type MedOnc  Treatment Phase Abnormal Scans  Barriers/Navigation Needs Coordination of Care;Education  Education Other  Interventions Coordination of Care;Education  Acuity Level 2-Minimal Needs (1-2 Barriers Identified)  Coordination of Care Appts;Other  Education Method Verbal  Support Groups/Services Friends and Family  Time Spent with Patient 30    

## 2022-10-27 ENCOUNTER — Ambulatory Visit: Payer: Medicare PPO | Admitting: Dietician

## 2022-10-27 NOTE — Progress Notes (Signed)
Nutrition Follow Up: Called patient at home telephone #.    ASSESSMENT: Patient reports her sister in law Izora Gala has come to stay with her for next 3 weeks to help with care and food prep for her and her spouse.  Izora Gala is a cook so she's going to make sure they have healthy balanced meals.  She will be meeting with radiation oncologist tomorrow.  Patient has been keeping track of bowels on notepad in kitchen and seems to be moving more consistently with use of the Dulcolax.    She had questions about getting an appetite stimulant.  I suggested she see how her appetite and intake progress with having her SIL making foods.       Anthropometrics: 15# (7.3%) weight loss past six months, she reports weight loss started in summer months   Height: 66" Weight:  10/14/22  180.3# 09/29/22  187# 09/20/22  188.4# 03/18/22  203 UBW: 200 BMI: 29.09   INTERVENTION:    Encouraged taking 2 extra gratitude bites (thank Izora Gala for coming to help, thank SIL for preparing healthy meals)  Encouraged prioritizing protein to maintain LBM.  Encouraged her to have [place] reach out if any questions.     MONITORING, EVALUATION, GOAL: weight, PO intake, Nutrition Impact Symptoms, labs Goal is weight maintenance.   Next Visit: Remote follow up on PO intake with XRT      [name], RDN, LDN Registered Dietitian, [place] Part Time Remote (Usual office hours: Tuesday-Thursday) Cell: [phone]

## 2022-10-28 ENCOUNTER — Other Ambulatory Visit: Payer: Self-pay

## 2022-10-28 ENCOUNTER — Inpatient Hospital Stay: Payer: Medicare PPO | Admitting: Hematology & Oncology

## 2022-10-28 ENCOUNTER — Encounter: Payer: Self-pay | Admitting: Hematology & Oncology

## 2022-10-28 ENCOUNTER — Inpatient Hospital Stay: Payer: Medicare PPO

## 2022-10-28 ENCOUNTER — Ambulatory Visit
Admission: RE | Admit: 2022-10-28 | Discharge: 2022-10-28 | Disposition: A | Discharge status: Home / Self Care | Payer: Medicare PPO | Source: Ambulatory Visit | Attending: Radiation Oncology | Admitting: Radiation Oncology

## 2022-10-28 ENCOUNTER — Other Ambulatory Visit (HOSPITAL_COMMUNITY): Payer: Self-pay

## 2022-10-28 ENCOUNTER — Ambulatory Visit: Payer: Medicare PPO | Admitting: Radiation Oncology

## 2022-10-28 ENCOUNTER — Encounter: Payer: Self-pay | Admitting: *Deleted

## 2022-10-28 ENCOUNTER — Telehealth: Payer: Self-pay

## 2022-10-28 ENCOUNTER — Telehealth: Payer: Self-pay | Admitting: Pharmacist

## 2022-10-28 VITALS — BP 121/72 | HR 88 | Temp 98.1°F | Resp 20 | Ht 66.0 in | Wt 182.0 lb

## 2022-10-28 DIAGNOSIS — C50919 Malignant neoplasm of unspecified site of unspecified female breast: Secondary | ICD-10-CM

## 2022-10-28 DIAGNOSIS — C50911 Malignant neoplasm of unspecified site of right female breast: Secondary | ICD-10-CM | POA: Diagnosis not present

## 2022-10-28 DIAGNOSIS — Z170 Estrogen receptor positive status [ER+]: Secondary | ICD-10-CM | POA: Insufficient documentation

## 2022-10-28 DIAGNOSIS — C7951 Secondary malignant neoplasm of bone: Secondary | ICD-10-CM | POA: Diagnosis not present

## 2022-10-28 DIAGNOSIS — Z510 Encounter for antineoplastic radiation therapy: Secondary | ICD-10-CM | POA: Insufficient documentation

## 2022-10-28 DIAGNOSIS — Z853 Personal history of malignant neoplasm of breast: Secondary | ICD-10-CM

## 2022-10-28 LAB — CBC WITH DIFFERENTIAL (CANCER CENTER ONLY)
Abs Immature Granulocytes: 0.03 10*3/uL (ref 0.00–0.07)
Basophils Absolute: 0 10*3/uL (ref 0.0–0.1)
Basophils Relative: 1 %
Eosinophils Absolute: 0.1 10*3/uL (ref 0.0–0.5)
Eosinophils Relative: 1 %
HCT: 43.5 % (ref 36.0–46.0)
Hemoglobin: 14.3 g/dL (ref 12.0–15.0)
Immature Granulocytes: 1 %
Lymphocytes Relative: 22 %
Lymphs Abs: 1.3 10*3/uL (ref 0.7–4.0)
MCH: 29.5 pg (ref 26.0–34.0)
MCHC: 32.9 g/dL (ref 30.0–36.0)
MCV: 89.7 fL (ref 80.0–100.0)
Monocytes Absolute: 0.6 10*3/uL (ref 0.1–1.0)
Monocytes Relative: 9 %
Neutro Abs: 4 10*3/uL (ref 1.7–7.7)
Neutrophils Relative %: 66 %
Platelet Count: 374 10*3/uL (ref 150–400)
RBC: 4.85 MIL/uL (ref 3.87–5.11)
RDW: 14.6 % (ref 11.5–15.5)
WBC Count: 6 10*3/uL (ref 4.0–10.5)
nRBC: 0 % (ref 0.0–0.2)

## 2022-10-28 LAB — CMP (CANCER CENTER ONLY)
ALT: 104 U/L — ABNORMAL HIGH (ref 0–44)
AST: 107 U/L — ABNORMAL HIGH (ref 15–41)
Albumin: 4.2 g/dL (ref 3.5–5.0)
Alkaline Phosphatase: 249 U/L — ABNORMAL HIGH (ref 38–126)
Anion gap: 11 (ref 5–15)
BUN: 12 mg/dL (ref 8–23)
CO2: 31 mmol/L (ref 22–32)
Calcium: 10.3 mg/dL (ref 8.9–10.3)
Chloride: 94 mmol/L — ABNORMAL LOW (ref 98–111)
Creatinine: 1.06 mg/dL — ABNORMAL HIGH (ref 0.44–1.00)
GFR, Estimated: 57 mL/min — ABNORMAL LOW (ref >60)
Glucose, Bld: 108 mg/dL — ABNORMAL HIGH (ref 70–99)
Potassium: 3 mmol/L — ABNORMAL LOW (ref 3.5–5.1)
Sodium: 136 mmol/L (ref 135–145)
Total Bilirubin: 0.7 mg/dL (ref 0.3–1.2)
Total Protein: 8.3 g/dL — ABNORMAL HIGH (ref 6.5–8.1)

## 2022-10-28 LAB — LACTATE DEHYDROGENASE: LDH: 324 U/L — ABNORMAL HIGH (ref 98–192)

## 2022-10-28 MED ORDER — SODIUM CHLORIDE 0.9 % IV SOLN: Freq: Once | INTRAVENOUS | Status: AC

## 2022-10-28 MED ORDER — RIBOCICLIB SUCC (600 MG DOSE) 200 MG PO TBPK
600.0000 mg | ORAL_TABLET | Freq: Every day | ORAL | 6 refills | Status: DC
  Filled 2022-10-28 – 2022-11-23 (×2): qty 63, 21d supply, fill #0
  Filled 2022-12-14: qty 63, 21d supply, fill #1
  Filled 2023-01-04: qty 63, 28d supply, fill #2
  Filled 2023-02-02: qty 63, 28d supply, fill #3
  Filled 2023-03-08: qty 63, 28d supply, fill #4
  Filled 2023-04-05: qty 63, 28d supply, fill #5
  Filled 2023-05-04: qty 63, 28d supply, fill #6

## 2022-10-28 MED ORDER — ZOLEDRONIC ACID 4 MG/100ML IV SOLN
4.0000 mg | Freq: Once | INTRAVENOUS | Status: AC
  Administered 2022-10-28: 4 mg via INTRAVENOUS
  Filled 2022-10-28: qty 100

## 2022-10-28 NOTE — Progress Notes (Signed)
START ON PATHWAY REGIMEN - Breast     Cycle 1: A cycle is 28 days:     Ribociclib      Fulvestrant    Cycles 2 and beyond: A cycle is every 28 days:     Ribociclib      Fulvestrant   **Always confirm dose/schedule in your pharmacy ordering system**  Patient Characteristics: Distant Metastases or Locoregional Recurrent Disease - Unresected or Locally Advanced Unresectable Disease Progressing after Neoadjuvant and Local Therapies, ER Positive, Endocrine Therapy, Postmenopausal, First Line, Prior Adjuvant Endocrine Therapy  Completed > 12 Months Ago, Candidate for CDK4/6 Inhibitor Therapeutic Status: Distant Metastases HER2 Status: Unknown ER Status: Positive (+) PR Status: Positive (+) Therapy Approach Indicated: Standard Chemotherapy/Endocrine Therapy Menopausal Status: Postmenopausal Line of Therapy: First Line Previous Therapy: Prior Adjuvant Endocrine Therapy Completed > 12 Months Ago Intent of Therapy: Non-Curative / Palliative Intent, Discussed with Patient

## 2022-10-28 NOTE — Telephone Encounter (Signed)
Oral Oncology Pharmacist Encounter  Will await HER-2 status result from liquid biopsy result drawn 10/28/22 until proceeding further with prescription as insurance will only cover Kisqali if patient has confirmation of HER-2 negative disease. MD notified.   Leron Croak, PharmD, BCPS, BCOP Hematology/Oncology Clinical Pharmacist [facility] [phone] 10/28/2022 1:32 PM

## 2022-10-28 NOTE — Progress Notes (Signed)
[facility] [address] [place], [place].  [address] [phone] (fax)   Your Providers PCP: [name], [place],  [phone]) Referring Provider: Carollee Herter, Alferd Apa, [place],  [phone]) Care Team Provider: Elza Rafter, MD,  [phone]) Care Team Provider: Dermatology, [place],  [phone]) Care Team Provider: Jola Schmidt, MD,  [phone]) Care Team Provider: Volanda Napoleon, MD,  [phone]) Care Team Provider: Cordelia Poche, RN    DIAGNOSES: Metastatic breast cancer-ER+/PR/+ Problem List Items Addressed This Visit   None   CURRENT THERAPY: XRT to left hip                                       Faslodex 500 mg IM monthly --start on 11/09/2022                                       Ribociclib 600 mg p.o. daily (21/7) --start on 11/09/2022                                       Zometa 4 mg IV q. 3 months --next dose on 02/15/2023  INTERIM HISTORY: Ms. Swor is back for follow-up.  She has metastatic breast cancer in all likelihood.  Unfortunate, orthopedic surgery did not feel that that she needed to have surgery to repair the left hip.  I thought that with orthopedic surgery, we can get tissue for diagnosis.  Her initial CA 27. 29 when we saw her was 131.  We did do a PET scan on her.  This was done on 10/11/2022.  This did show hypermetabolic bone disease.  She had hypermetabolic right hilar adenopathy.  Surprising, there is no disease in her major organs.  We did do an MRI of the brain.  This was done on 10/05/2022.  This did not show any evidence of parenchymal brain disease.  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  She does not complain of any real pain except when she moves quickly.  Again, it be nice to have a tissue diagnosis.  However, says think we can do this right now, we are sending off her blood for molecular  analysis.  Given the fact that there might be a right hilar lymph node that we might biopsy, I will see if Pulmonary medicine might be able to see her and see if they feel that a EUS can be done.  She has had no problems with nausea or vomiting.  She has had no cough or shortness of breath.  She does feel little bit tight under the sternum.  There is no change in bowel or bladder habits.  Overall, I would say that her performance status is probably ECOG 1.  PHYSICAL EXAMINATION:    Vital signs:   Vitals:   10/28/22 0907  BP: 121/72  Pulse: 88  Resp: 20  Temp: 98.1 F (36.7 C)  TempSrc: Oral  SpO2: 94%  Weight: 182 lb 0.6 oz (82.6 kg)  Height: _0  (1.676 m)    Physical Exam Vitals reviewed.  HENT:     Head: Normocephalic and atraumatic.  Eyes:     Pupils:  Pupils are equal, round, and reactive to light.  Cardiovascular:     Rate and Rhythm: Normal rate and regular rhythm.     Heart sounds: Normal heart sounds.  Pulmonary:     Effort: Pulmonary effort is normal.     Breath sounds: Normal breath sounds.  Abdominal:     General: Bowel sounds are normal.     Palpations: Abdomen is soft.  Musculoskeletal:        General: No tenderness or deformity. Normal range of motion.     Cervical back: Normal range of motion.  Lymphadenopathy:     Cervical: No cervical adenopathy.  Skin:    General: Skin is warm and dry.     Findings: No erythema or rash.  Neurological:     Mental Status: She is alert and oriented to person, place, and time.  Psychiatric:        Behavior: Behavior normal.        Thought Content: Thought content normal.        Judgment: Judgment normal.      LABORATORY STUDIES:  CBC    Component Value Date/Time   WBC 6.0 10/28/2022 0838   WBC 6.7 09/21/2022 1031   RBC 4.85 10/28/2022 0838   HGB 14.3 10/28/2022 0838   HCT 43.5 10/28/2022 0838   PLT 374 10/28/2022 0838   MCV 89.7 10/28/2022 0838   MCH 29.5 10/28/2022 0838   MCHC 32.9 10/28/2022 0838    RDW 14.6 10/28/2022 0838   LYMPHSABS 1.3 10/28/2022 0838   MONOABS 0.6 10/28/2022 0838   EOSABS 0.1 10/28/2022 0838   BASOSABS 0.0 10/28/2022 7416     IMPRESSION: Ms. Jahr is a 71 y.o. 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  This was back in 1994.  Now, looks like she does have metastatic disease.  At this point, we still have to believe that she has ER positive disease.  I just do not know what the HER2 status is.  We are going to have to try to get a liquid biopsy to do check to the HER2 status.  I will see if pulmonary medicine might be able to do a bronchoscopy and get a biopsy for [place].  We are going to have to get her on antiestrogen therapy I think.  I will see about putting her on Faslodex along with ribociclib.  I think this would be an excellent choice for her.  I think we will have a good response with this protocol.  She will have Zometa today.  She did have radiation therapy to the left hip starting next week.  I think she will get 15 treatments.  Hopefully, since her disease is pretty much confined to her bones, hopefully we can get a good response with continued antiestrogen therapy.  Again, we really need to try to get tissue for HER2 studies.  I will hopefully be able to start the Faslodex in a week or so.  I will send in the ribociclib prescription to our pharmacy.  They will help manage the cost of this and make sure that Ms. Musa receives this.  I would like to probably see Ms. Hernan back in another month, or sooner..   ______________________________  Volanda Napoleon, M.D.  10/28/22 10:41 AM

## 2022-10-28 NOTE — Telephone Encounter (Signed)
Oral Oncology Patient Advocate Encounter   Received notification that prior authorization for Tracy Thompson is required.   PA submitted on 11.02.23  Key  [id number]  Status is pending     Tracy Thompson, [place] Patient [place]  [phone] (phone) [phone] (fax) 10/28/2022 1:29 PM

## 2022-10-28 NOTE — Progress Notes (Signed)
Visited with patient prior to her MD visit. She is here to discuss treatment. She is already scheduled later today for radiation sim.   [facility] Liquid Biopsy was drawn and will be shipped today. Also genetic sample, via [facility] Kit, drawn and shipped. Genetics notified.   Patient will start Kisqali and Faslodex. Prescription sent to specialty pharmacy. She will initiate Zometa today.   Oncology Nurse Navigator Documentation     10/28/2022    9:15 AM  Oncology Nurse Navigator Flowsheets  Phase of Treatment Radiation  Radiation Actual Start Date: 11/03/2022  Radiation Expected End Date: 11/24/2022  Navigator Follow Up Date: 12/02/2022  Navigator Follow Up Reason: Follow-up Appointment  Navigator Location [facility]  Navigator Encounter Type Follow-up Appt;Molecular Studies  Patient Visit Type MedOnc  Treatment Phase Abnormal Scans  Barriers/Navigation Needs Coordination of Care;Education  Education Other  Interventions Coordination of Care;Education  Acuity Level 2-Minimal Needs (1-2 Barriers Identified)  Coordination of Care Other  Education Method Verbal  Support Groups/Services Friends and Family  Time Spent with Patient 45    

## 2022-10-28 NOTE — Patient Instructions (Signed)
[facility]  Discharge Instructions: Thank you for choosing [facility] to provide your oncology and hematology care.   If you have a lab appointment with the [facility], please go directly to the [facility] and check in at the registration area.  Wear comfortable clothing and clothing appropriate for easy access to any Portacath or PICC line.   We strive to give you quality time with your provider. You may need to reschedule your appointment if you arrive late (15 or more minutes).  Arriving late affects you and other patients whose appointments are after yours.  Also, if you miss three or more appointments without notifying the office, you may be dismissed from the clinic at the provider's discretion.      For prescription refill requests, have your pharmacy contact our office and allow 72 hours for refills to be completed.    Today you received the following chemotherapy and/or immunotherapy agents Zometa      To help prevent nausea and vomiting after your treatment, we encourage you to take your nausea medication as directed.  BELOW ARE SYMPTOMS THAT SHOULD BE REPORTED IMMEDIATELY: *FEVER GREATER THAN 100.4 F (38 C) OR HIGHER *CHILLS OR SWEATING *NAUSEA AND VOMITING THAT IS NOT CONTROLLED WITH YOUR NAUSEA MEDICATION *UNUSUAL SHORTNESS OF BREATH *UNUSUAL BRUISING OR BLEEDING *URINARY PROBLEMS (pain or burning when urinating, or frequent urination) *BOWEL PROBLEMS (unusual diarrhea, constipation, pain near the anus) TENDERNESS IN MOUTH AND THROAT WITH OR WITHOUT PRESENCE OF ULCERS (sore throat, sores in mouth, or a toothache) UNUSUAL RASH, SWELLING OR PAIN  UNUSUAL VAGINAL DISCHARGE OR ITCHING   Items with * indicate a potential emergency and should be followed up as soon as possible or go to the Emergency Department if any problems should occur.  Please show the CHEMOTHERAPY ALERT CARD or IMMUNOTHERAPY ALERT CARD at check-in to the  Emergency Department and triage nurse. Should you have questions after your visit or need to cancel or reschedule your appointment, please contact [facility]  [phone] and follow the prompts.  Office hours are 8:00 a.m. to 4:30 p.m. Monday - Friday. Please note that voicemails left after 4:00 p.m. may not be returned until the following business day.  We are closed weekends and major holidays. You have access to a nurse at all times for urgent questions. Please call the main number to the clinic [phone] and follow the prompts.  For any non-urgent questions, you may also contact your provider using MyChart. We now offer e-Visits for anyone 25 and older to request care online for non-urgent symptoms. For details visit [email or web address].   Also download the MyChart app! Go to the app store, search "MyChart", open the app, select [facility], and log in with your MyChart username and password.  Masks are optional in the cancer centers. If you would like for your care team to wear a mask while they are taking care of you, please let them know. You may have one support person who is at least 70 years old accompany you for your appointments.

## 2022-10-29 ENCOUNTER — Telehealth: Payer: Self-pay | Admitting: Genetic Counselor

## 2022-10-29 ENCOUNTER — Encounter: Payer: Self-pay | Admitting: Hematology & Oncology

## 2022-10-29 ENCOUNTER — Other Ambulatory Visit: Payer: Self-pay

## 2022-10-29 ENCOUNTER — Encounter: Payer: Self-pay | Admitting: *Deleted

## 2022-10-29 ENCOUNTER — Other Ambulatory Visit: Payer: Self-pay | Admitting: *Deleted

## 2022-10-29 ENCOUNTER — Other Ambulatory Visit: Payer: Self-pay | Admitting: Pharmacist

## 2022-10-29 LAB — CANCER ANTIGEN 27.29: CA 27.29: 154.4 U/mL — ABNORMAL HIGH (ref 0.0–38.6)

## 2022-10-29 MED ORDER — PROCHLORPERAZINE MALEATE 10 MG PO TABS: 10.0000 mg | ORAL_TABLET | Freq: Four times a day (QID) | ORAL | 0 refills | Status: DC | PRN

## 2022-10-29 MED ORDER — ONDANSETRON HCL 8 MG PO TABS: 8.0000 mg | ORAL_TABLET | Freq: Three times a day (TID) | ORAL | 0 refills | Status: DC | PRN

## 2022-10-29 NOTE — Telephone Encounter (Signed)
Genetics drawn 11/2 with other labs.  TRF submitted for [facility] Common Hereditary Cancers +RNA.  Patient's genetics appt scheduled 11/30.

## 2022-10-29 NOTE — Progress Notes (Signed)
Patient received her first dose of zometa in the office yesterday. Last night she got extremely nauseous. She had "aching" to her chest and back which she rated 8. This morning, she continues to have severe nausea. She is still having pain but rating it a 5. She also has no energy and is very weak.   Spoke to Dr Marin Olp and Theone [facility]. Recommendations given:  Ibuprofen, Compazine, fluids. Basically treat the flu-like symptoms and give it slowly if she gets it again. It should resolve in 24-48 hours.   There will also be a note made on her profile to administer Zometa over 60 minutes going forward.   Called and relayed suggestion to patient. Medications for nausea sent to pharmacy. Patient is aware and pharmacy confirmed.   While talking to the patient's sister, Izora Gala, she mentioned that the patient also felt hot. Instructed her to take the patient's temp. It was 101.4. This is shared with Dr Marin Olp who suggests the patient take COVID test.   Patient's sister called to notify office that covid test was negative.   Oncology Nurse Navigator Documentation     10/29/2022    9:45 AM  Oncology Nurse Navigator Flowsheets  Navigator Follow Up Date: 12/02/2022  Navigator Follow Up Reason: Follow-up Appointment  Navigator Location [facility]  Navigator Encounter Type Telephone  Telephone Symptom Mgt;Incoming Call  Patient Visit Type MedOnc  Treatment Phase Pre-Tx/Tx Discussion  Barriers/Navigation Needs Coordination of Care;Education  Education Pain/ Symptom Management  Interventions Education;Psycho-Social Support;Medication Assistance  Acuity Level 2-Minimal Needs (1-2 Barriers Identified)  Education Method Verbal  Support Groups/Services Friends and Family  Time Spent with Patient 30

## 2022-11-01 ENCOUNTER — Other Ambulatory Visit: Payer: Self-pay | Admitting: Family Medicine

## 2022-11-01 DIAGNOSIS — I10 Essential (primary) hypertension: Secondary | ICD-10-CM

## 2022-11-02 DIAGNOSIS — Z170 Estrogen receptor positive status [ER+]: Secondary | ICD-10-CM | POA: Diagnosis not present

## 2022-11-02 DIAGNOSIS — Z510 Encounter for antineoplastic radiation therapy: Secondary | ICD-10-CM | POA: Diagnosis not present

## 2022-11-02 DIAGNOSIS — C50911 Malignant neoplasm of unspecified site of right female breast: Secondary | ICD-10-CM | POA: Diagnosis not present

## 2022-11-02 DIAGNOSIS — C7951 Secondary malignant neoplasm of bone: Secondary | ICD-10-CM | POA: Diagnosis not present

## 2022-11-03 ENCOUNTER — Ambulatory Visit
Admission: RE | Admit: 2022-11-03 | Discharge: 2022-11-03 | Disposition: A | Discharge status: Home / Self Care | Payer: Medicare PPO | Source: Ambulatory Visit | Attending: Radiation Oncology | Admitting: Radiation Oncology

## 2022-11-03 ENCOUNTER — Other Ambulatory Visit: Payer: Self-pay

## 2022-11-03 ENCOUNTER — Other Ambulatory Visit: Payer: Self-pay | Admitting: Hematology & Oncology

## 2022-11-03 ENCOUNTER — Telehealth: Payer: Self-pay | Admitting: Internal Medicine

## 2022-11-03 DIAGNOSIS — Z170 Estrogen receptor positive status [ER+]: Secondary | ICD-10-CM | POA: Diagnosis not present

## 2022-11-03 DIAGNOSIS — C50919 Malignant neoplasm of unspecified site of unspecified female breast: Secondary | ICD-10-CM

## 2022-11-03 DIAGNOSIS — Z510 Encounter for antineoplastic radiation therapy: Secondary | ICD-10-CM | POA: Diagnosis not present

## 2022-11-03 DIAGNOSIS — C50911 Malignant neoplasm of unspecified site of right female breast: Secondary | ICD-10-CM | POA: Diagnosis not present

## 2022-11-03 DIAGNOSIS — C7951 Secondary malignant neoplasm of bone: Secondary | ICD-10-CM | POA: Diagnosis not present

## 2022-11-03 LAB — RAD ONC ARIA SESSION SUMMARY

## 2022-11-03 NOTE — Telephone Encounter (Signed)
-----  Message from Volanda Napoleon, MD sent at 10/28/2022 10:55 AM EDT ----- Leroy Sea: I was wondering if you might be doing a huge, huge favor.  Ms. Danielsen has likely recurrent metastatic breast cancer.  I really cannot get a biopsy on her although on her PET scan there is activity in a right hilar lymph node.  I do not know if this might be accessible by bronchoscopy and EBUS??  If so, would you be able to see her and try to get me a biopsy.  I really need tissue to confirm a diagnosis per more so to make sure that we have estrogen and HER2 studies.  Thanks so much for all of your help.  I know this might be challenging.  Laurey Arrow

## 2022-11-03 NOTE — Telephone Encounter (Signed)
Please schedule the following:  Provider performing procedure:Dr. Carlis Abbott Diagnosis: Adenopathy Which side if for nodule / mass? Right Procedure: Bronchoscopy with EBUS  Has patient been spoken to by Provider and given informed consent? Yes: Dr. Tamala Julian called and informed patient Anesthesia: general Do you need Fluro? no Duration of procedure: 2 hours Date: 11/13 OR 11/15 OR 11/16 Alternate Date: see above,   Time: MUST BE EARLY AM, patient has radiation therapy at [facility] every afternoon next week Location: [facility] Does patient have OSA? no DM? no Or Latex allergy? no Medication Restriction/ Anticoagulate/Antiplatelet: baby aspirin: fine to take Pre-op Labs Ordered:determined by Anesthesia Imaging request: None  (If, SuperDimension CT Chest, please have STAT courier sent to ENDO)  Please coordinate Pre-op COVID Testing

## 2022-11-04 ENCOUNTER — Ambulatory Visit
Admission: RE | Admit: 2022-11-04 | Discharge: 2022-11-04 | Disposition: A | Discharge status: Home / Self Care | Payer: Medicare PPO | Source: Ambulatory Visit | Attending: Radiation Oncology | Admitting: Radiation Oncology

## 2022-11-04 ENCOUNTER — Encounter: Payer: Self-pay | Admitting: Pulmonary Disease

## 2022-11-04 ENCOUNTER — Other Ambulatory Visit: Payer: Self-pay

## 2022-11-04 ENCOUNTER — Inpatient Hospital Stay: Payer: Medicare PPO

## 2022-11-04 VITALS — BP 102/62 | HR 88 | Resp 18 | Wt 175.0 lb

## 2022-11-04 DIAGNOSIS — Z170 Estrogen receptor positive status [ER+]: Secondary | ICD-10-CM | POA: Diagnosis not present

## 2022-11-04 DIAGNOSIS — Z01812 Encounter for preprocedural laboratory examination: Secondary | ICD-10-CM

## 2022-11-04 DIAGNOSIS — C7951 Secondary malignant neoplasm of bone: Secondary | ICD-10-CM | POA: Diagnosis not present

## 2022-11-04 DIAGNOSIS — Z510 Encounter for antineoplastic radiation therapy: Secondary | ICD-10-CM | POA: Diagnosis not present

## 2022-11-04 DIAGNOSIS — C50911 Malignant neoplasm of unspecified site of right female breast: Secondary | ICD-10-CM | POA: Diagnosis not present

## 2022-11-04 DIAGNOSIS — C50919 Malignant neoplasm of unspecified site of unspecified female breast: Secondary | ICD-10-CM

## 2022-11-04 LAB — RAD ONC ARIA SESSION SUMMARY

## 2022-11-04 MED ORDER — FULVESTRANT 250 MG/5ML IM SOSY
500.0000 mg | PREFILLED_SYRINGE | Freq: Once | INTRAMUSCULAR | Status: AC
  Administered 2022-11-04: 500 mg via INTRAMUSCULAR
  Filled 2022-11-04: qty 10

## 2022-11-04 NOTE — Telephone Encounter (Signed)
Tracy Thompson has spoken to Dr's Carlis Abbott, Tamala Julian & McQuaid - pt is scheduled for 11/15 at 8:30 at [facility].  Pt will need to get covid test in office before 11:45 on 11/13.  Called pt to give her appt info & person answered stated she is at an appt but will have her to call me back.  Once I speak to her I will send her letter.  Nothing further needed on this message.

## 2022-11-04 NOTE — Patient Instructions (Signed)

## 2022-11-04 NOTE — Telephone Encounter (Signed)
I called [facility] & spoke to [place].  She states the only thing they could possibly do next week in the mornings would be 11/16.  Dr Henrene Pastor has a case at 8:00 that morning and if you want to contact him and see if he will be willing to move his patient's back they could do that morning.

## 2022-11-05 ENCOUNTER — Ambulatory Visit: Payer: Medicare PPO

## 2022-11-05 NOTE — Telephone Encounter (Signed)
No PA Req:

## 2022-11-06 DIAGNOSIS — C50919 Malignant neoplasm of unspecified site of unspecified female breast: Secondary | ICD-10-CM | POA: Diagnosis not present

## 2022-11-08 ENCOUNTER — Ambulatory Visit
Admission: RE | Admit: 2022-11-08 | Discharge: 2022-11-08 | Disposition: A | Discharge status: Home / Self Care | Payer: Medicare PPO | Source: Ambulatory Visit | Attending: Radiation Oncology | Admitting: Radiation Oncology

## 2022-11-08 ENCOUNTER — Other Ambulatory Visit: Payer: Self-pay

## 2022-11-08 ENCOUNTER — Other Ambulatory Visit: Payer: Medicare PPO

## 2022-11-08 DIAGNOSIS — C7951 Secondary malignant neoplasm of bone: Secondary | ICD-10-CM | POA: Diagnosis not present

## 2022-11-08 DIAGNOSIS — Z01812 Encounter for preprocedural laboratory examination: Secondary | ICD-10-CM | POA: Diagnosis not present

## 2022-11-08 DIAGNOSIS — Z510 Encounter for antineoplastic radiation therapy: Secondary | ICD-10-CM | POA: Diagnosis not present

## 2022-11-08 DIAGNOSIS — C50911 Malignant neoplasm of unspecified site of right female breast: Secondary | ICD-10-CM | POA: Diagnosis not present

## 2022-11-08 DIAGNOSIS — Z170 Estrogen receptor positive status [ER+]: Secondary | ICD-10-CM | POA: Diagnosis not present

## 2022-11-08 LAB — RAD ONC ARIA SESSION SUMMARY
Course Elapsed Days: 5
Plan Fractions Treated to Date: 3
Plan Fractions Treated to Date: 3
Plan Prescribed Dose Per Fraction: 3 Gy
Plan Prescribed Dose Per Fraction: 3 Gy
Plan Total Fractions Prescribed: 10
Plan Total Fractions Prescribed: 10
Plan Total Prescribed Dose: 30 Gy
Plan Total Prescribed Dose: 30 Gy
Reference Point Dosage Given to Date: 9 Gy
Reference Point Dosage Given to Date: 9 Gy
Reference Point Session Dosage Given: 3 Gy
Reference Point Session Dosage Given: 3 Gy
Session Number: 3

## 2022-11-09 ENCOUNTER — Ambulatory Visit
Admission: RE | Admit: 2022-11-09 | Discharge: 2022-11-09 | Disposition: A | Discharge status: Home / Self Care | Payer: Medicare PPO | Source: Ambulatory Visit | Attending: Radiation Oncology | Admitting: Radiation Oncology

## 2022-11-09 ENCOUNTER — Other Ambulatory Visit: Payer: Self-pay | Admitting: Hematology & Oncology

## 2022-11-09 ENCOUNTER — Encounter (HOSPITAL_COMMUNITY): Payer: Self-pay | Admitting: Pulmonary Disease

## 2022-11-09 ENCOUNTER — Telehealth: Payer: Self-pay

## 2022-11-09 ENCOUNTER — Encounter: Payer: Self-pay | Admitting: *Deleted

## 2022-11-09 ENCOUNTER — Other Ambulatory Visit: Payer: Self-pay | Admitting: Pulmonary Disease

## 2022-11-09 ENCOUNTER — Other Ambulatory Visit: Payer: Self-pay

## 2022-11-09 DIAGNOSIS — C50919 Malignant neoplasm of unspecified site of unspecified female breast: Secondary | ICD-10-CM

## 2022-11-09 DIAGNOSIS — C50911 Malignant neoplasm of unspecified site of right female breast: Secondary | ICD-10-CM | POA: Diagnosis not present

## 2022-11-09 DIAGNOSIS — Z510 Encounter for antineoplastic radiation therapy: Secondary | ICD-10-CM | POA: Diagnosis not present

## 2022-11-09 DIAGNOSIS — C7951 Secondary malignant neoplasm of bone: Secondary | ICD-10-CM | POA: Diagnosis not present

## 2022-11-09 DIAGNOSIS — Z170 Estrogen receptor positive status [ER+]: Secondary | ICD-10-CM | POA: Diagnosis not present

## 2022-11-09 LAB — RAD ONC ARIA SESSION SUMMARY
Course Elapsed Days: 6
Plan Fractions Treated to Date: 4
Plan Fractions Treated to Date: 4
Plan Prescribed Dose Per Fraction: 3 Gy
Plan Prescribed Dose Per Fraction: 3 Gy
Plan Total Fractions Prescribed: 10
Plan Total Fractions Prescribed: 10
Plan Total Prescribed Dose: 30 Gy
Plan Total Prescribed Dose: 30 Gy
Reference Point Dosage Given to Date: 12 Gy
Reference Point Dosage Given to Date: 12 Gy
Reference Point Session Dosage Given: 3 Gy
Reference Point Session Dosage Given: 3 Gy
Session Number: 4

## 2022-11-09 MED ORDER — HYDROCODONE-ACETAMINOPHEN 5-325 MG PO TABS: 1.0000 | ORAL_TABLET | Freq: Four times a day (QID) | ORAL | 0 refills | Status: DC | PRN

## 2022-11-09 NOTE — [facility] Notes (Signed)
Received notification from Radiation Oncology that patient is c/o pain not improved with tramadol. Spoke with Dr Marin Olp and he will send in a new prescription for additional pain control medication.   Marylene Land LPN will notify patient of new prescription.   Oncology Nurse Navigator Documentation     11/09/2022    1:00 PM  Oncology Nurse Navigator Flowsheets  Navigator Follow Up Date: 12/02/2022  Navigator Follow Up Reason: Follow-up Appointment  Navigator Location [facility]  Navigator Encounter Type Other:  Patient Visit Type MedOnc  Treatment Phase Active Tx  Barriers/Navigation Needs Coordination of Care;Education  Interventions Medication Assistance;Coordination of Care  Acuity Level 2-Minimal Needs (1-2 Barriers Identified)  Coordination of Care Radiology  Support Groups/Services Friends and Family  Time Spent with Patient 15

## 2022-11-09 NOTE — [facility] Notes (Signed)
Mrs Tracy Thompson denies chest pain or shortness of breath.hospital Patient denies having any s/s of Covid in her household, also denies any known exposure to Covid.   Mrs Tracy Thompson's PCP is Dr Magdalen Spatz.

## 2022-11-09 NOTE — Telephone Encounter (Signed)
Called to make patient aware that Dr. Marin Olp sent in new prescription for hydrocodone-apap. Patient voiced understanding.

## 2022-11-10 ENCOUNTER — Other Ambulatory Visit: Payer: Self-pay

## 2022-11-10 ENCOUNTER — Ambulatory Visit: Payer: Medicare PPO

## 2022-11-10 ENCOUNTER — Ambulatory Visit (HOSPITAL_COMMUNITY): Payer: Medicare PPO | Admitting: Physician Assistant

## 2022-11-10 ENCOUNTER — Other Ambulatory Visit: Payer: Self-pay | Admitting: *Deleted

## 2022-11-10 ENCOUNTER — Ambulatory Visit (HOSPITAL_COMMUNITY)
Admission: RE | Admit: 2022-11-10 | Discharge: 2022-11-10 | Disposition: A | Discharge status: Home / Self Care | Payer: Medicare PPO | Attending: Pulmonary Disease | Admitting: Pulmonary Disease

## 2022-11-10 ENCOUNTER — Encounter (HOSPITAL_COMMUNITY)
Admission: RE | Disposition: A | Discharge status: Home / Self Care | Payer: Self-pay | Source: Home / Self Care | Attending: Pulmonary Disease

## 2022-11-10 ENCOUNTER — Ambulatory Visit (HOSPITAL_BASED_OUTPATIENT_CLINIC_OR_DEPARTMENT_OTHER): Payer: Medicare PPO | Admitting: Physician Assistant

## 2022-11-10 ENCOUNTER — Encounter: Payer: Self-pay | Admitting: *Deleted

## 2022-11-10 ENCOUNTER — Encounter (HOSPITAL_COMMUNITY): Payer: Self-pay | Admitting: Pulmonary Disease

## 2022-11-10 DIAGNOSIS — C801 Malignant (primary) neoplasm, unspecified: Secondary | ICD-10-CM | POA: Diagnosis not present

## 2022-11-10 DIAGNOSIS — C7800 Secondary malignant neoplasm of unspecified lung: Secondary | ICD-10-CM | POA: Diagnosis not present

## 2022-11-10 DIAGNOSIS — C50919 Malignant neoplasm of unspecified site of unspecified female breast: Secondary | ICD-10-CM | POA: Diagnosis not present

## 2022-11-10 DIAGNOSIS — J9809 Other diseases of bronchus, not elsewhere classified: Secondary | ICD-10-CM | POA: Insufficient documentation

## 2022-11-10 DIAGNOSIS — Z853 Personal history of malignant neoplasm of breast: Secondary | ICD-10-CM | POA: Insufficient documentation

## 2022-11-10 DIAGNOSIS — J984 Other disorders of lung: Secondary | ICD-10-CM | POA: Diagnosis not present

## 2022-11-10 DIAGNOSIS — C7801 Secondary malignant neoplasm of right lung: Secondary | ICD-10-CM | POA: Diagnosis not present

## 2022-11-10 DIAGNOSIS — C50011 Malignant neoplasm of nipple and areola, right female breast: Secondary | ICD-10-CM

## 2022-11-10 DIAGNOSIS — R590 Localized enlarged lymph nodes: Secondary | ICD-10-CM | POA: Insufficient documentation

## 2022-11-10 DIAGNOSIS — I10 Essential (primary) hypertension: Secondary | ICD-10-CM | POA: Diagnosis not present

## 2022-11-10 DIAGNOSIS — C3431 Malignant neoplasm of lower lobe, right bronchus or lung: Secondary | ICD-10-CM | POA: Insufficient documentation

## 2022-11-10 DIAGNOSIS — R591 Generalized enlarged lymph nodes: Secondary | ICD-10-CM | POA: Diagnosis not present

## 2022-11-10 DIAGNOSIS — Z170 Estrogen receptor positive status [ER+]: Secondary | ICD-10-CM | POA: Diagnosis not present

## 2022-11-10 HISTORY — PX: BRONCHIAL BRUSHINGS: SHX5108

## 2022-11-10 HISTORY — PX: VIDEO BRONCHOSCOPY WITH ENDOBRONCHIAL ULTRASOUND: SHX6177

## 2022-11-10 HISTORY — PX: BRONCHIAL WASHINGS: SHX5105

## 2022-11-10 HISTORY — DX: Dyspnea, unspecified: R06.00

## 2022-11-10 HISTORY — DX: Headache, unspecified: R51.9

## 2022-11-10 HISTORY — DX: Lymphedema, not elsewhere classified: I89.0

## 2022-11-10 HISTORY — PX: BRONCHIAL BIOPSY: SHX5109

## 2022-11-10 LAB — NOVEL CORONAVIRUS, NAA: SARS-CoV-2, NAA: NOT DETECTED

## 2022-11-10 SURGERY — BRONCHOSCOPY, WITH EBUS
Anesthesia: General

## 2022-11-10 MED ORDER — CHLORHEXIDINE GLUCONATE 0.12 % MT SOLN: 15.0000 mL | Freq: Once | OROMUCOSAL | Status: AC

## 2022-11-10 MED ORDER — FENTANYL CITRATE (PF) 100 MCG/2ML IJ SOLN
INTRAMUSCULAR | Status: DC | PRN
  Administered 2022-11-10 (×2): 50 ug via INTRAVENOUS

## 2022-11-10 MED ORDER — PROPOFOL 10 MG/ML IV BOLUS
INTRAVENOUS | Status: DC | PRN
  Administered 2022-11-10: 100 mg via INTRAVENOUS

## 2022-11-10 MED ORDER — PHENYLEPHRINE HCL-NACL 20-0.9 MG/250ML-% IV SOLN
INTRAVENOUS | Status: DC | PRN
  Administered 2022-11-10: 25 ug/min via INTRAVENOUS

## 2022-11-10 MED ORDER — DRONABINOL 2.5 MG PO CAPS: 2.5000 mg | ORAL_CAPSULE | Freq: Two times a day (BID) | ORAL | 0 refills | Status: DC

## 2022-11-10 MED ORDER — ROCURONIUM BROMIDE 10 MG/ML (PF) SYRINGE
PREFILLED_SYRINGE | INTRAVENOUS | Status: DC | PRN
  Administered 2022-11-10: 50 mg via INTRAVENOUS

## 2022-11-10 MED ORDER — PHENYLEPHRINE 80 MCG/ML (10ML) SYRINGE FOR IV PUSH (FOR BLOOD PRESSURE SUPPORT)
PREFILLED_SYRINGE | INTRAVENOUS | Status: DC | PRN
  Administered 2022-11-10 (×4): 80 ug via INTRAVENOUS

## 2022-11-10 MED ORDER — PROPOFOL 500 MG/50ML IV EMUL
INTRAVENOUS | Status: DC | PRN
  Administered 2022-11-10: 150 ug/kg/min via INTRAVENOUS

## 2022-11-10 MED ORDER — CHLORHEXIDINE GLUCONATE 0.12 % MT SOLN
OROMUCOSAL | Status: AC
  Administered 2022-11-10: 15 mL via OROMUCOSAL
  Filled 2022-11-10: qty 15

## 2022-11-10 MED ORDER — LIDOCAINE 2% (20 MG/ML) 5 ML SYRINGE
INTRAMUSCULAR | Status: DC | PRN
  Administered 2022-11-10: 60 mg via INTRAVENOUS

## 2022-11-10 MED ORDER — DEXAMETHASONE SODIUM PHOSPHATE 10 MG/ML IJ SOLN
INTRAMUSCULAR | Status: DC | PRN
  Administered 2022-11-10: 10 mg via INTRAVENOUS

## 2022-11-10 MED ORDER — FENTANYL CITRATE (PF) 100 MCG/2ML IJ SOLN: 25.0000 ug | INTRAMUSCULAR | Status: DC | PRN

## 2022-11-10 MED ORDER — MIDAZOLAM HCL 5 MG/5ML IJ SOLN
INTRAMUSCULAR | Status: DC | PRN
  Administered 2022-11-10: 2 mg via INTRAVENOUS

## 2022-11-10 MED ORDER — LACTATED RINGERS IV SOLN: INTRAVENOUS | Status: DC

## 2022-11-10 MED ORDER — OLANZAPINE 5 MG PO TABS: 5.0000 mg | ORAL_TABLET | Freq: Every day | ORAL | 3 refills | Status: DC

## 2022-11-10 MED ORDER — SUGAMMADEX SODIUM 200 MG/2ML IV SOLN
INTRAVENOUS | Status: DC | PRN
  Administered 2022-11-10: 200 mg via INTRAVENOUS

## 2022-11-10 MED ORDER — SUCCINYLCHOLINE CHLORIDE 200 MG/10ML IV SOSY
PREFILLED_SYRINGE | INTRAVENOUS | Status: DC | PRN
  Administered 2022-11-10: 100 mg via INTRAVENOUS

## 2022-11-10 NOTE — H&P (Signed)
[facility] P[facility]  CC: here for bronchoscopy HPI: 71 y/o female with a distant history of breast cancer who recently had a PET CT that showed hypermetabolic lymph nodes in the right hilum.  She has not had recent pneumonia or other infections.  Her oncologist Dr. Marin Olp has asked that we biopsy those lymph nodes.  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   Headache    younger 43   Hypertension    Lymphedema    right arm   Menopause    chemo induced     Family History  Problem Relation Age of Onset   Cancer Mother        Bladder CA, colon   Colon cancer Mother    Stroke Mother    Hypertension Mother    Hyperlipidemia Mother    Hepatitis Mother        hep A   COPD Mother    Dementia Mother    Hypertension Father    Prostate cancer Father    Diabetes Father    Stroke Father    Pulmonary embolism Father    Deep vein thrombosis Father        PE   Cancer Father        prostate cancer   Schizophrenia Brother    Mental illness Brother    COPD Brother    Hepatitis C Brother    Liver disease Other      Social History   Socioeconomic History   Marital status: Married    Spouse name: Not on file   Number of children: Not on file   Years of education: Not on file   Highest education level: Not on file  Occupational History   Occupation: retired Product manager: RETIRED  Tobacco Use   Smoking status: Never   Smokeless tobacco: Never  Vaping Use   Vaping Use: Never used  Substance and Sexual Activity   Alcohol use: No   Drug use: No   Sexual activity: Yes    Partners: Male  Other Topics Concern   Not on file  Social History Narrative   Exercise--- walking   Social Determinants of Health   Financial Resource Strain: Low Risk   (07/20/2021)   Overall Financial Resource Strain (CARDIA)    Difficulty of Paying Living Expenses: Not hard at all  Food Insecurity: No Food Insecurity (07/07/2020)   Hunger Vital Sign    Worried About Running Out of Food in the Last Year: Never true    [place] in the Last Year: Never true  Transportation Needs: No Transportation Needs (07/20/2021)   PRAPARE - Hydrologist (Medical): No    Lack of Transportation (Non-Medical): No  Physical Activity: Sufficiently Active (07/20/2021)   Exercise Vital Sign    Days of Exercise per Week: 7 days    Minutes of Exercise per Session: 30 min  Stress: No Stress Concern Present (07/20/2021)   [place]    Feeling of Stress : Not at all  Social Connections: Moderately Isolated (07/20/2021)   Social Connection and Isolation Panel [NHANES]    Frequency of Communication with Friends and Family:  More than three times a week    Frequency of Social Gatherings with Friends and Family: More than three times a week    Attends Religious Services: Never    Marine scientist or Organizations: No    Attends Archivist Meetings: Never    Marital Status: Married  Human resources officer Violence: Not At Risk (07/20/2021)   Humiliation, Afraid, Rape, and Kick questionnaire    Fear of Current or Ex-Partner: No    Emotionally Abused: No    Physically Abused: No    Sexually Abused: No     No Known Allergies   @encmedstart @  Vitals:   11/10/22 0723  BP: (!) 145/73  Pulse: 91  Resp: 18  Temp: 98.1 F (36.7 C)  TempSrc: Oral  SpO2: 98%  Weight: 78 kg  Height: 5\' 6"  (1.676 m)   General:  Resting comfortably in bed HENT: NCAT OP clear PULM: CTA B, normal effort CV: RRR, no mgr GI: BS+, soft, nontender MSK: normal bulk and tone Neuro: awake, alert, no distress, MAEW  CBC    Component Value Date/Time   WBC 6.0 10/28/2022 0838   WBC 6.7  09/21/2022 1031   RBC 4.85 10/28/2022 0838   HGB 14.3 10/28/2022 0838   HCT 43.5 10/28/2022 0838   PLT 374 10/28/2022 0838   MCV 89.7 10/28/2022 0838   MCH 29.5 10/28/2022 0838   MCHC 32.9 10/28/2022 0838   RDW 14.6 10/28/2022 0838   LYMPHSABS 1.3 10/28/2022 0838   MONOABS 0.6 10/28/2022 0838   EOSABS 0.1 10/28/2022 0838   BASOSABS 0.0 10/28/2022 0838   PET CT: hypermetabolic cluster of lymph nodes R hilum  Impression: Breast cancer Metabolically active lymph nodes R hilum in chest  Plan: Bronchoscopy with EBUS, FNA of R hilar lymph nodes  Roselie Awkward, MD [place] P[facility]M Pager: [phone] Cell: [phone] After 7:00 pm call Elink  [phone]

## 2022-11-10 NOTE — Progress Notes (Signed)
Patient completed bronch with EBUS and biopsy. Will follow for pathology.  Received a call from the patient's husband. He is concerned because Tracy Thompson isn't eating. She c/o decreased appetite with minimal po intake. Her husband is encouraging intake with meals and supplements like ensure, but she is not interested. Her appetite is minimal, but she's also having issues with nausea.   She has a follow up with nutrition tomorrow. We discussed taking her antiemetics on a schedule. I also reviewed starting a stool softener or Miralax daily to help prevent the possible constipation side effect.   Spoke with Dr Marin Olp. He would like patient to start zyprexa daily and marinol TID to treat her nausea and stimulate her appetite.   Oncology Nurse Navigator Documentation     11/10/2022    9:00 AM  Oncology Nurse Navigator Flowsheets  Navigator Follow Up Date: 11/12/2022  Navigator Follow Up Reason: Pathology  Navigator Location [facility]  Navigator Encounter Type Telephone  Telephone Symptom Mgt;Medication Assistance;Incoming Call  Patient Visit Type MedOnc  Treatment Phase Active Tx  Barriers/Navigation Needs Coordination of Care;Education  Education Pain/ Symptom Management  Interventions Education;Psycho-Social Support  Acuity Level 2-Minimal Needs (1-2 Barriers Identified)  Education Method Verbal  Support Groups/Services Friends and Family  Time Spent with Patient 30

## 2022-11-10 NOTE — Anesthesia Postprocedure Evaluation (Signed)
Anesthesia Post Note  Patient: Tracy Thompson  Procedure(s) Performed: VIDEO BRONCHOSCOPY WITH ENDOBRONCHIAL ULTRASOUND BRONCHIAL BIOPSIES BRONCHIAL WASHINGS BRONCHIAL BRUSHINGS     Patient location during evaluation: PACU Anesthesia Type: General Level of consciousness: awake and alert Pain management: pain level controlled Vital Signs Assessment: post-procedure vital signs reviewed and stable Respiratory status: spontaneous breathing, nonlabored ventilation and respiratory function stable Cardiovascular status: blood pressure returned to baseline and stable Postop Assessment: no apparent nausea or vomiting Anesthetic complications: no  No notable events documented.  Last Vitals:  Vitals:   11/10/22 0945 11/10/22 1000  BP: (!) 150/68 (!) 155/80  Pulse: 81 83  Resp: 16 17  Temp:  36.9 C  SpO2: 94% 95%    Last Pain:  Vitals:   11/10/22 1000  TempSrc:   PainSc: 0-No pain                 Karenna Romanoff,W. EDMOND

## 2022-11-10 NOTE — Anesthesia Procedure Notes (Signed)
Procedure Name: Intubation Date/Time: 11/10/2022 8:26 AM  Performed by: Colin Benton, CRNAPre-anesthesia Checklist: Patient identified, Emergency Drugs available, Suction available and Patient being monitored Patient Re-evaluated:Patient Re-evaluated prior to induction Oxygen Delivery Method: Circle system utilized Preoxygenation: Pre-oxygenation with 100% oxygen Induction Type: IV induction and Rapid sequence Ventilation: Mask ventilation without difficulty Laryngoscope Size: Miller and 2 Grade View: Grade I Tube type: Oral Tube size: 8.5 mm Number of attempts: 1 Airway Equipment and Method: Stylet Placement Confirmation: ETT inserted through vocal cords under direct vision, positive ETCO2 and breath sounds checked- equal and bilateral Secured at: 22 cm Tube secured with: Tape Dental Injury: Teeth and Oropharynx as per pre-operative assessment

## 2022-11-10 NOTE — Op Note (Addendum)
[facility] Cardiopulmonary Patient Name: Tracy Thompson Date: 11/10/2022 MRN: [id number] Attending MD: Juanito Doom , MD,  Date of Birth: 1951/09/04 CSN: Supervisor Override Age: 71 Admit Type: Inpatient Gender: Female Procedure:             Bronchoscopy Indications:           Hilar lymphadenopathy of the right side Providers:             Nathaneil Canary B. Lake Bells, MD, Glori Bickers, RN, William Zumbro, Technician Referring MD:          Ronie Spies. Jocelyne Reinertsen, MD Medicines:             General Anesthesia Complications:         No immediate complications Estimated Blood Loss:  Estimated blood loss was minimal. Procedure:             Pre-Anesthesia Assessment:                        - A History and Physical has been performed. Patient                         meds and allergies have been reviewed. The risks and                         benefits of the procedure and the sedation options and                         risks were discussed with the patient. All questions                         were answered and informed consent was obtained.                         Patient identification and proposed procedure were                         verified prior to the procedure by the physician and                         the nurse in the pre-procedure area. Mental Status                         Examination: normal. Airway Examination: normal                         oropharyngeal airway. Respiratory Examination: clear                         to auscultation. CV Examination: normal. ASA Grade                         Assessment: II - A patient with mild systemic disease.                         After reviewing the risks and benefits, the patient  was deemed in satisfactory condition to undergo the                         procedure. The anesthesia plan was to use general                         anesthesia. Immediately prior to  administration of                         medications, the patient was re-assessed for adequacy                         to receive sedatives. The heart rate, respiratory                         rate, oxygen saturations, blood pressure, adequacy of                         pulmonary ventilation, and response to care were                         monitored throughout the procedure. The physical                         status of the patient was re-assessed after the                         procedure.                        After obtaining informed consent, the bronchoscope was                         passed under direct vision. Throughout the procedure,                         the patient's blood pressure, pulse, and oxygen                         saturations were monitored continuously. the [id number]                         )[id number]) Olympus broncoscope was introduced through                         the mouth, via the endotracheal tube and advanced to                         the tracheobronchial tree. the Bronchoscope was                         introduced through the and advanced to the. The                         procedure was accomplished without difficulty. The                         patient tolerated the procedure well. The total  duration of the procedure was 34 minutes. Scope In: Scope Out: Findings:      The nasopharynx/oropharynx appears normal. The larynx appears normal.       The vocal cords appear normal. The subglottic space is normal. The       trachea is of normal caliber. The carina is sharp. The tracheobronchial       tree of the left lung was examined to at least the first subsegmental       level. Bronchial mucosa and anatomy in the left lung are normal; there       are no endobronchial lesions, and no secretions.      Right Lung Abnormalities: Extrinsic compression was found in the       bronchus intermedius. The airway lumen is nearly occluded.  The lesion       was not traversed. The bronchoscope was advanced until wedged at the       desired location for bronchoalveolar lavage. BAL was performed in the       right lower lobe of the lung and sent for routine cytology and       bacterial, AFB and fungal analysis. 80 mL of fluid were instilled. 24 mL       were returned. The return was blood-tinged. There were no mucoid plugs       in the return fluid. Guided brushings were obtained in the right lower       lobe with a cytology brush. Two samples were obtained.      An endobronchial ultrasound endoscope was utilized in order to assist       with fine needle aspiration of enlarged lymph nodes. Lymph nodes were       inspected at the 4R, 4L, 7, and left hilar locations and no enlarged       lymph nodes were identified. Attention was then turned to the right       hilar area. Because of the airway narrowing the ultrasound probe could       not be positioned further than proximal bronchus intermedius. Multiple       attempts were made to position the scope in such a manner that the lymph       nodes could be visualized well enough to safely take a needle       aspiration. I was able to visualize a cluster of lymph nodes in the 11 R       position but the right main pulmonary artery was overlying the image at       the entry point of any potential needle aspirations. Full 360 degree       rotation was performed, balloon inflated and deflated several times but       unfortunately no position could be established without pulmonary artery       in direct path of any potential needle passes. At this point the EBUS       scope was removed and the conventional scope was passed again.      Guided endobronchial biopsies were obtained in the right lower lobe and       sent for histopathology examination. 7 were obtained. Impression:            - Hilar lymphadenopathy of the right side                        - The airway examination of the left  lung was  normal.                        - Extrinsic compression was found in the bronchus                         intermedius likely secondary to posterior mass effect.                        - Bronchoalveolar lavage was performed.                        - Brushings were obtained.                        - Brushings were obtained.                        - Endobronchial ultrasound was performed. Moderate Sedation:      General Anesthesia Recommendation:        - Await BAL, biopsy and brushing results. Procedure Code(s):     --- Professional ---                        [phone], Bronchoscopy, rigid or flexible, including                         fluoroscopic guidance, when performed; with bronchial                         alveolar lavage                        [phone], Bronchoscopy, rigid or flexible, including                         fluoroscopic guidance, when performed; with brushing                         or protected brushings                        [id number], Bronchoscopy, rigid or flexible, including                         fluoroscopic guidance, when performed; with                         transendoscopic endobronchial ultrasound (EBUS) during                         bronchoscopic diagnostic or therapeutic                         intervention(s) for peripheral lesion(s) (List                         separately in addition to code for primary                         procedure[s]) Diagnosis Code(s):     --- Professional ---  R59.0, Localized enlarged lymph nodes                        J98.09, Other diseases of bronchus, not elsewhere                         classified                        J98.4, Other disorders of lung CPT copyright 2022 American Medical Association. All rights reserved. The codes documented in this report are preliminary and upon coder review may  be revised to meet current compliance requirements. Norlene Campbell, MD Juanito Doom,  MD 11/10/2022 9:33:14 AM This report has been signed electronically. Number of Addenda: 0

## 2022-11-10 NOTE — Anesthesia Preprocedure Evaluation (Addendum)
Anesthesia Evaluation  Patient identified by MRN, date of birth, ID band Patient awake    Reviewed: Allergy & Precautions, H&P , NPO status , Patient's Chart, lab work & pertinent test results, reviewed documented beta blocker date and time   Airway Mallampati: II  TM Distance: >3 FB Neck ROM: Full    Dental no notable dental hx. (+) Teeth Intact, Dental Advisory Given   Pulmonary neg pulmonary ROS   Pulmonary exam normal breath sounds clear to auscultation       Cardiovascular hypertension, Pt. on medications and Pt. on home beta blockers  Rhythm:Regular Rate:Normal     Neuro/Psych  Headaches  negative psych ROS   GI/Hepatic Neg liver ROS,GERD  ,,  Endo/Other  negative endocrine ROS    Renal/GU negative Renal ROS  negative genitourinary   Musculoskeletal   Abdominal   Peds  Hematology negative hematology ROS (+)   Anesthesia Other Findings   Reproductive/Obstetrics negative OB ROS                             Anesthesia Physical Anesthesia Plan  ASA: 3  Anesthesia Plan: General   Post-op Pain Management: Minimal or no pain anticipated   Induction: Intravenous  PONV Risk Score and Plan: 4 or greater and Ondansetron, Dexamethasone, Propofol infusion and TIVA  Airway Management Planned: Oral ETT  Additional Equipment:   Intra-op Plan:   Post-operative Plan: Extubation in OR  Informed Consent: I have reviewed the patients History and Physical, chart, labs and discussed the procedure including the risks, benefits and alternatives for the proposed anesthesia with the patient or authorized representative who has indicated his/her understanding and acceptance.     Dental advisory given  Plan Discussed with: CRNA and Surgeon  Anesthesia Plan Comments:        Anesthesia Quick Evaluation

## 2022-11-10 NOTE — Transfer of Care (Signed)
Immediate Anesthesia Transfer of Care Note  Patient: Tracy Thompson  Procedure(s) Performed: VIDEO BRONCHOSCOPY WITH ENDOBRONCHIAL ULTRASOUND BRONCHIAL BIOPSIES BRONCHIAL WASHINGS BRONCHIAL BRUSHINGS  Patient Location: PACU  Anesthesia Type:General  Level of Consciousness: drowsy and patient cooperative  Airway & Oxygen Therapy: Patient Spontanous Breathing and Patient connected to face mask oxygen  Post-op Assessment: Report given to RN, Post -op Vital signs reviewed and stable, and Patient moving all extremities X 4  Post vital signs: Reviewed and stable  Last Vitals:  Vitals Value Taken Time  BP 162/62 11/10/22 0936  Temp    Pulse 83 11/10/22 0940  Resp 20 11/10/22 0940  SpO2 100 % 11/10/22 0940  Vitals shown include unvalidated device data.  Last Pain:  Vitals:   11/10/22 0733  TempSrc:   PainSc: 3       Patients Stated Pain Goal: 0 (57/32/20 2542)  Complications: No notable events documented.

## 2022-11-11 ENCOUNTER — Other Ambulatory Visit: Payer: Self-pay

## 2022-11-11 ENCOUNTER — Ambulatory Visit
Admission: RE | Admit: 2022-11-11 | Discharge: 2022-11-11 | Disposition: A | Discharge status: Home / Self Care | Payer: Medicare PPO | Source: Ambulatory Visit | Attending: Radiation Oncology | Admitting: Radiation Oncology

## 2022-11-11 ENCOUNTER — Inpatient Hospital Stay: Payer: Medicare PPO | Admitting: Dietician

## 2022-11-11 DIAGNOSIS — Z170 Estrogen receptor positive status [ER+]: Secondary | ICD-10-CM | POA: Diagnosis not present

## 2022-11-11 DIAGNOSIS — C7951 Secondary malignant neoplasm of bone: Secondary | ICD-10-CM | POA: Diagnosis not present

## 2022-11-11 DIAGNOSIS — Z510 Encounter for antineoplastic radiation therapy: Secondary | ICD-10-CM | POA: Diagnosis not present

## 2022-11-11 DIAGNOSIS — C50911 Malignant neoplasm of unspecified site of right female breast: Secondary | ICD-10-CM | POA: Diagnosis not present

## 2022-11-11 LAB — RAD ONC ARIA SESSION SUMMARY
Course Elapsed Days: 8
Plan Fractions Treated to Date: 5
Plan Fractions Treated to Date: 5
Plan Prescribed Dose Per Fraction: 3 Gy
Plan Prescribed Dose Per Fraction: 3 Gy
Plan Total Fractions Prescribed: 10
Plan Total Fractions Prescribed: 10
Plan Total Prescribed Dose: 30 Gy
Plan Total Prescribed Dose: 30 Gy
Reference Point Dosage Given to Date: 15 Gy
Reference Point Dosage Given to Date: 15 Gy
Reference Point Session Dosage Given: 3 Gy
Reference Point Session Dosage Given: 3 Gy
Session Number: 5

## 2022-11-11 LAB — ACID FAST SMEAR (AFB, MYCOBACTERIA): Acid Fast Smear: NEGATIVE

## 2022-11-11 NOTE — Progress Notes (Signed)
Nutrition Follow Up: Called patient at home telephone #.    ASSESSMENT:  Patient reports she thinks 182# was not reflective of actual weight (heavy coat). Hasn't picked up the Marinol yet.  Still eating but just a little bits (2 slices apples with peanut butter).  Throat still sore from procedure yesterday.  Having nausea when foods cooking. Last BM 4 days ago. Started Miralax last night, took it again this morning. She doesn't tolerate the Ensure products.  She agreed to trial chocolate milk that she says she enjoys. She does report increased fatigue with radiation treatments. She is due to finish radiation 11/16/21.    Medications Included: Marinol ordered, KCL (admits she isn't taking regularly, was taking at night and tough to swallow)  Labs:  10/28/22  K+ 3.0  Anthropometrics: weight loss continues down  Height: 66" Weight:  11/10/21  172# 10/14/22  180.3# 09/29/22  187# 09/20/22  188.4# 03/18/22  203 UBW: 200 BMI: 27.76   INTERVENTION:   Encouraged taking 2 cups chocolate milk (whole milk) or hot chocolate Encouraged taking KCl in morning  Encouraged prioritizing protein to maintain LBM, and small frequent feeds Relayed that Marinol' appetite stimulation may take a few days for full effect.  Encouraged high potassium food choices.  Spoke with Tracy Thompson her sister law and repeated plan.     MONITORING, EVALUATION, GOAL: weight, PO intake, Nutrition Impact Symptoms, labs Goal is weight maintenance.   Next Visit: Remote follow up on PO intake after MD follow up 12/02/22    Tracy Thompson, RDN, LDN Registered Dietitian, [place] Part Time Remote (Usual office hours: Tuesday-Thursday) Cell: [phone]

## 2022-11-12 ENCOUNTER — Ambulatory Visit
Admission: RE | Admit: 2022-11-12 | Discharge: 2022-11-12 | Disposition: A | Discharge status: Home / Self Care | Payer: Medicare PPO | Source: Ambulatory Visit | Attending: Radiation Oncology | Admitting: Radiation Oncology

## 2022-11-12 ENCOUNTER — Telehealth: Payer: Self-pay | Admitting: Pulmonary Disease

## 2022-11-12 ENCOUNTER — Other Ambulatory Visit: Payer: Self-pay

## 2022-11-12 DIAGNOSIS — C50911 Malignant neoplasm of unspecified site of right female breast: Secondary | ICD-10-CM | POA: Diagnosis not present

## 2022-11-12 DIAGNOSIS — Z170 Estrogen receptor positive status [ER+]: Secondary | ICD-10-CM | POA: Diagnosis not present

## 2022-11-12 DIAGNOSIS — Z510 Encounter for antineoplastic radiation therapy: Secondary | ICD-10-CM | POA: Diagnosis not present

## 2022-11-12 DIAGNOSIS — C7951 Secondary malignant neoplasm of bone: Secondary | ICD-10-CM | POA: Diagnosis not present

## 2022-11-12 LAB — RAD ONC ARIA SESSION SUMMARY
Course Elapsed Days: 9
Plan Fractions Treated to Date: 6
Plan Fractions Treated to Date: 6
Plan Prescribed Dose Per Fraction: 3 Gy
Plan Prescribed Dose Per Fraction: 3 Gy
Plan Total Fractions Prescribed: 10
Plan Total Fractions Prescribed: 10
Plan Total Prescribed Dose: 30 Gy
Plan Total Prescribed Dose: 30 Gy
Reference Point Dosage Given to Date: 18 Gy
Reference Point Dosage Given to Date: 18 Gy
Reference Point Session Dosage Given: 3 Gy
Reference Point Session Dosage Given: 3 Gy
Session Number: 6

## 2022-11-12 LAB — CULTURE, BAL-QUANTITATIVE W GRAM STAIN
Culture: NO GROWTH
Gram Stain: NONE SEEN

## 2022-11-12 NOTE — Telephone Encounter (Signed)
Spoke with Dr. Chauncey Cruel from pathology with [facility] about pt's biopsies.  Biopsies are positive for tumor Getting some rushed immuno stains to see what the primary is  This is just prelim info. Routing this info to Dr. Lake Bells as an Juluis Rainier.

## 2022-11-14 ENCOUNTER — Encounter (HOSPITAL_COMMUNITY): Payer: Self-pay | Admitting: Pulmonary Disease

## 2022-11-14 ENCOUNTER — Other Ambulatory Visit: Payer: Self-pay

## 2022-11-14 ENCOUNTER — Ambulatory Visit
Admission: RE | Admit: 2022-11-14 | Discharge: 2022-11-14 | Disposition: A | Discharge status: Home / Self Care | Payer: Medicare PPO | Source: Ambulatory Visit | Attending: Radiation Oncology | Admitting: Radiation Oncology

## 2022-11-14 DIAGNOSIS — Z510 Encounter for antineoplastic radiation therapy: Secondary | ICD-10-CM | POA: Diagnosis not present

## 2022-11-14 DIAGNOSIS — Z170 Estrogen receptor positive status [ER+]: Secondary | ICD-10-CM | POA: Diagnosis not present

## 2022-11-14 DIAGNOSIS — C7951 Secondary malignant neoplasm of bone: Secondary | ICD-10-CM | POA: Diagnosis not present

## 2022-11-14 DIAGNOSIS — C50911 Malignant neoplasm of unspecified site of right female breast: Secondary | ICD-10-CM | POA: Diagnosis not present

## 2022-11-14 LAB — RAD ONC ARIA SESSION SUMMARY
Course Elapsed Days: 11
Plan Fractions Treated to Date: 7
Plan Fractions Treated to Date: 7
Plan Prescribed Dose Per Fraction: 3 Gy
Plan Prescribed Dose Per Fraction: 3 Gy
Plan Total Fractions Prescribed: 10
Plan Total Fractions Prescribed: 10
Plan Total Prescribed Dose: 30 Gy
Plan Total Prescribed Dose: 30 Gy
Reference Point Dosage Given to Date: 21 Gy
Reference Point Dosage Given to Date: 21 Gy
Reference Point Session Dosage Given: 3 Gy
Reference Point Session Dosage Given: 3 Gy
Session Number: 7

## 2022-11-15 ENCOUNTER — Other Ambulatory Visit: Payer: Self-pay

## 2022-11-15 ENCOUNTER — Encounter: Payer: Self-pay | Admitting: *Deleted

## 2022-11-15 ENCOUNTER — Ambulatory Visit
Admission: RE | Admit: 2022-11-15 | Discharge: 2022-11-15 | Disposition: A | Discharge status: Home / Self Care | Payer: Medicare PPO | Source: Ambulatory Visit | Attending: Radiation Oncology | Admitting: Radiation Oncology

## 2022-11-15 DIAGNOSIS — Z170 Estrogen receptor positive status [ER+]: Secondary | ICD-10-CM | POA: Diagnosis not present

## 2022-11-15 DIAGNOSIS — C7951 Secondary malignant neoplasm of bone: Secondary | ICD-10-CM | POA: Diagnosis not present

## 2022-11-15 DIAGNOSIS — C50911 Malignant neoplasm of unspecified site of right female breast: Secondary | ICD-10-CM | POA: Diagnosis not present

## 2022-11-15 DIAGNOSIS — Z510 Encounter for antineoplastic radiation therapy: Secondary | ICD-10-CM | POA: Diagnosis not present

## 2022-11-15 LAB — RAD ONC ARIA SESSION SUMMARY
Course Elapsed Days: 12
Plan Fractions Treated to Date: 8
Plan Fractions Treated to Date: 8
Plan Prescribed Dose Per Fraction: 3 Gy
Plan Prescribed Dose Per Fraction: 3 Gy
Plan Total Fractions Prescribed: 10
Plan Total Fractions Prescribed: 10
Plan Total Prescribed Dose: 30 Gy
Plan Total Prescribed Dose: 30 Gy
Reference Point Dosage Given to Date: 24 Gy
Reference Point Dosage Given to Date: 24 Gy
Reference Point Session Dosage Given: 3 Gy
Reference Point Session Dosage Given: 3 Gy
Session Number: 8

## 2022-11-15 LAB — AEROBIC/ANAEROBIC CULTURE W GRAM STAIN (SURGICAL/DEEP WOUND): Culture: NO GROWTH

## 2022-11-15 LAB — CYTOLOGY - NON PAP

## 2022-11-15 NOTE — Progress Notes (Signed)
Pathology reviewed with Dr Marin Olp. He would like specimen sent for ER/PR and HER2 testing. Message sent to [facility] pathology via email requesting this testing.   Oncology Nurse Navigator Documentation     11/15/2022   11:15 AM  Oncology Nurse Navigator Flowsheets  Navigator Follow Up Date: 12/02/2022  Navigator Follow Up Reason: Follow-up Appointment  Navigator Location [facility]  Navigator Encounter Type Pathology Review  Patient Visit Type MedOnc  Treatment Phase Active Tx  Barriers/Navigation Needs Coordination of Care;Education  Interventions Coordination of Care  Acuity Level 2-Minimal Needs (1-2 Barriers Identified)  Coordination of Care Pathology  Support Groups/Services Friends and Family  Time Spent with Patient 30

## 2022-11-16 ENCOUNTER — Ambulatory Visit
Admission: RE | Admit: 2022-11-16 | Discharge: 2022-11-16 | Disposition: A | Discharge status: Home / Self Care | Payer: Medicare PPO | Source: Ambulatory Visit | Attending: Radiation Oncology | Admitting: Radiation Oncology

## 2022-11-16 ENCOUNTER — Ambulatory Visit: Payer: Medicare PPO

## 2022-11-16 ENCOUNTER — Other Ambulatory Visit: Payer: Self-pay

## 2022-11-16 DIAGNOSIS — Z170 Estrogen receptor positive status [ER+]: Secondary | ICD-10-CM | POA: Diagnosis not present

## 2022-11-16 DIAGNOSIS — C50911 Malignant neoplasm of unspecified site of right female breast: Secondary | ICD-10-CM | POA: Diagnosis not present

## 2022-11-16 DIAGNOSIS — C7951 Secondary malignant neoplasm of bone: Secondary | ICD-10-CM | POA: Diagnosis not present

## 2022-11-16 DIAGNOSIS — Z510 Encounter for antineoplastic radiation therapy: Secondary | ICD-10-CM | POA: Diagnosis not present

## 2022-11-16 LAB — RAD ONC ARIA SESSION SUMMARY
Course Elapsed Days: 13
Plan Fractions Treated to Date: 9
Plan Fractions Treated to Date: 9
Plan Prescribed Dose Per Fraction: 3 Gy
Plan Prescribed Dose Per Fraction: 3 Gy
Plan Total Fractions Prescribed: 10
Plan Total Fractions Prescribed: 10
Plan Total Prescribed Dose: 30 Gy
Plan Total Prescribed Dose: 30 Gy
Reference Point Dosage Given to Date: 27 Gy
Reference Point Dosage Given to Date: 27 Gy
Reference Point Session Dosage Given: 3 Gy
Reference Point Session Dosage Given: 3 Gy
Session Number: 9

## 2022-11-17 ENCOUNTER — Ambulatory Visit: Payer: Medicare PPO

## 2022-11-17 ENCOUNTER — Other Ambulatory Visit: Payer: Self-pay

## 2022-11-17 ENCOUNTER — Encounter: Payer: Self-pay | Admitting: Radiation Oncology

## 2022-11-17 ENCOUNTER — Ambulatory Visit
Admission: RE | Admit: 2022-11-17 | Discharge: 2022-11-17 | Disposition: A | Discharge status: Home / Self Care | Payer: Medicare PPO | Source: Ambulatory Visit | Attending: Radiation Oncology | Admitting: Radiation Oncology

## 2022-11-17 DIAGNOSIS — C50911 Malignant neoplasm of unspecified site of right female breast: Secondary | ICD-10-CM | POA: Diagnosis not present

## 2022-11-17 DIAGNOSIS — Z510 Encounter for antineoplastic radiation therapy: Secondary | ICD-10-CM | POA: Diagnosis not present

## 2022-11-17 DIAGNOSIS — Z170 Estrogen receptor positive status [ER+]: Secondary | ICD-10-CM | POA: Diagnosis not present

## 2022-11-17 DIAGNOSIS — C7951 Secondary malignant neoplasm of bone: Secondary | ICD-10-CM | POA: Diagnosis not present

## 2022-11-17 LAB — RAD ONC ARIA SESSION SUMMARY
Course Elapsed Days: 14
Plan Fractions Treated to Date: 10
Plan Fractions Treated to Date: 10
Plan Prescribed Dose Per Fraction: 3 Gy
Plan Prescribed Dose Per Fraction: 3 Gy
Plan Total Fractions Prescribed: 10
Plan Total Fractions Prescribed: 10
Plan Total Prescribed Dose: 30 Gy
Plan Total Prescribed Dose: 30 Gy
Reference Point Dosage Given to Date: 30 Gy
Reference Point Dosage Given to Date: 30 Gy
Reference Point Session Dosage Given: 3 Gy
Reference Point Session Dosage Given: 3 Gy
Session Number: 10

## 2022-11-18 ENCOUNTER — Ambulatory Visit: Payer: Medicare PPO

## 2022-11-22 ENCOUNTER — Ambulatory Visit: Payer: Medicare PPO

## 2022-11-22 LAB — SURGICAL PATHOLOGY

## 2022-11-23 ENCOUNTER — Other Ambulatory Visit (HOSPITAL_COMMUNITY): Payer: Self-pay

## 2022-11-23 ENCOUNTER — Ambulatory Visit: Payer: Medicare PPO

## 2022-11-23 ENCOUNTER — Other Ambulatory Visit: Payer: Self-pay | Admitting: Hematology & Oncology

## 2022-11-23 NOTE — Telephone Encounter (Signed)
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  CMP and CBC w/ Diff from 10/28/22 assessed, noted patient with AST 107 U/L, ALT 104 U/L and alk phos 249 U/L, T. Bili WNL at 0.7 mg/dL. No baseline dose adjustments required at this time, however if patient has worsening of LFTs while on therapy, dose may need to be adjusted at that time. Scr of 1.06 mg/dL (CrCl ~60.8 mL/min). Baseline EKG from 11/10/22 with Qtc 477 ms. Prescription dose and frequency assessed for appropriateness. Appropriate for therapy initiation.   Current medication list in Epic reviewed, DDIs with Kisqali identified: Category C drug-drug interaction between [place] and ondansetron as well as olanzapine due to risk of Qtc prolongation while on concomitant agents. Patient baseline EKG obtained, and will have EKG obtained on day 14 after therapy initiation and at start of cycle 2. Category C drug-drug interaction between Kisqali and Amlodipine - Kisqali, a moderate CYP3A4 inhibitor may increase serum concentrations of amlodipine, leading to increased ADEs from amlodipine, such as hypotension. Patient last BP obtained on 10/28/22 was 121/72 mmHg. No dose adjustments required at this time.  Category C drug-drug interaction between [place] and Atorvastatin - Kisqali, a moderate CYP3A4 inhibitor may increase serum concentrations of atorvastatin, leading to increased ADEs such as myalgias or LFT elevations. No changes in therapy warranted at this time.  Category C drug-drug interaction between Kisqali and Dronabinol - Kisqali may increase effects of dronabinol via CYP3A4 inhibition. Recommend monitoring for increased sedation, dizziness, etc. No changes in therapy required at this time.  Category C drug-drug interaction  between [place] and Hydrocodone - Kisqali may increase effects of hydrocodone via CYP3A4 inhibition. Recommend monitoring for increased sedation, dizziness, etc. No changes in therapy required at this time.   Evaluated chart and no patient barriers to medication adherence noted.   Prescription has been e-scribed to the [facility] for benefits analysis and approval.  Oral Oncology Clinic will continue to follow for insurance authorization, copayment issues, initial counseling and start date.  Leron Croak, PharmD, BCPS, [facility] Hematology/Oncology Clinical Pharmacist [facility] and [place] [phone] 11/23/2022 10:49 AM

## 2022-11-23 NOTE — Telephone Encounter (Signed)
Oral Oncology Patient Advocate Encounter   Received notification that prior authorization for Thelma Comp is required.   PA submitted on 11.28.23  Key [id number]  Status is pending     [name], [place] Patient [place]  [phone] (phone) [phone] (fax) 11/23/2022 11:43 AM

## 2022-11-23 NOTE — Telephone Encounter (Signed)
Oral Oncology Patient Advocate Encounter  Prior Authorization for Tracy Thompson has been approved.    PA# [id number]  Effective dates: 11.28.23 through 05.26.24  Patients co-pay is $100.    [name], [name] Patient [place]  [phone] (phone) [phone] (fax) 11/23/2022 11:44 AM

## 2022-11-23 NOTE — Telephone Encounter (Signed)
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   Counseled patient on administration, dosing, side effects, monitoring, drug-food interactions, safe handling, storage, and disposal.  Patient will take Kisqali 274m tablets, 3 tablets (6023m by mouth once daily without regard to food.  Patient will take Kisqali for 3 weeks on, 1 week off, repeat every 4 weeks.  Patient knows to avoid grapefruit and grapefruit juice as well as pomegranate juice.   Kisqali start date: 11/25/22  Patient will need EKG on 12/08/22 (~C1D14) and then repeat EKG on 12/23/22 (~C2D1) per manufacturer recomendations. MD office notified.  Electrolytes will also be closely monitored at treatment initiation. Patient has OV on 12/02/22.   Adverse effects include but are not limited to: nausea, vomiting, diarrhea, constipation, fatigue, decreased blood counts, and altered cardiac conduction. Severe, life-threatening, and/or fatal interstitial lung disease (ILD) and/or pneumonitis may occur with CDK 4/6 inhibitors.  Patient has anti-emetic on hand and knows to take it if nausea develops.   Patient will obtain anti diarrheal and alert the office of 4 or more loose stools above baseline.  Reviewed with patient importance of keeping a medication schedule and plan for any missed doses. No barriers to medication adherence identified.  Medication reconciliation performed and medication/allergy list updated.  All questions answered.  Ms. DaMctigueoiced understanding and appreciation.   Medication education handout placed in mail for patient. Patient knows to call the office with questions or concerns. Oral Chemotherapy Clinic phone number provided to patient.   ReLeron CroakPharmD, BCPS, BCC[facility]al[facility]matology/Oncology Clinical Pharmacist [id number] [phone] 12961/28/2023 1:47 PM

## 2022-11-23 NOTE — Telephone Encounter (Signed)
We are still awaiting results of test before proceeding with PA. Will submit once results are back.  Tracy Thompson, [facility] Patient [place]  [phone] (phone) [phone] (fax) 11/23/2022 8:43 AM

## 2022-11-24 ENCOUNTER — Encounter: Payer: Self-pay | Admitting: Genetic Counselor

## 2022-11-24 ENCOUNTER — Ambulatory Visit: Payer: Medicare PPO

## 2022-11-24 DIAGNOSIS — Z1379 Encounter for other screening for genetic and chromosomal anomalies: Secondary | ICD-10-CM | POA: Insufficient documentation

## 2022-11-24 DIAGNOSIS — Z1501 Genetic susceptibility to malignant neoplasm of breast: Secondary | ICD-10-CM | POA: Insufficient documentation

## 2022-11-24 HISTORY — DX: Genetic susceptibility to malignant neoplasm of breast: Z15.01

## 2022-11-25 ENCOUNTER — Other Ambulatory Visit: Payer: Medicare PPO

## 2022-11-25 ENCOUNTER — Other Ambulatory Visit: Payer: Self-pay

## 2022-11-25 ENCOUNTER — Encounter: Payer: Self-pay | Admitting: Genetic Counselor

## 2022-11-25 ENCOUNTER — Inpatient Hospital Stay (HOSPITAL_BASED_OUTPATIENT_CLINIC_OR_DEPARTMENT_OTHER): Payer: Medicare PPO | Admitting: Genetic Counselor

## 2022-11-25 DIAGNOSIS — Z1502 Genetic susceptibility to malignant neoplasm of ovary: Secondary | ICD-10-CM

## 2022-11-25 DIAGNOSIS — Z1589 Genetic susceptibility to other disease: Secondary | ICD-10-CM

## 2022-11-25 DIAGNOSIS — Z1509 Genetic susceptibility to other malignant neoplasm: Secondary | ICD-10-CM

## 2022-11-25 DIAGNOSIS — Z1501 Genetic susceptibility to malignant neoplasm of breast: Secondary | ICD-10-CM

## 2022-11-25 NOTE — Progress Notes (Addendum)
REFERRING PROVIDER: Volanda Napoleon, MD [address] [place],  [place] [address]  PRIMARY PROVIDER:  Carollee Herter, Alferd Apa, DO  PRIMARY REASON FOR VISIT:  Encounter Diagnosis  Name Primary?   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 Ketchem, a 71 y.o. female, was seen for a [place] cancer genetics consultation at the request of Dr. Marin Olp due to a personal history of cancer.  Ms. Zundel presents to clinic today to discuss her genetic test results.   Ms. Auble was diagnosed with metastatic breast cancer at age 31. She also has a history of breast cancer at age 8-41.   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  Report date is 11/23/2022.    The Common Hereditary Cancers + RNA Panel offered by [facility] includes sequencing, deletion/duplication, and RNA testing of the following 47 genes: APC, ATM, AXIN2, BARD1, BMPR1A, BRCA1, BRCA2, BRIP1, CDH1, CDK4*, CDKN2A (p14ARF)*, CDKN2A (p16INK4a)*, CHEK2, CTNNA1, DICER1, EPCAM (Deletion/duplication testing only), GREM1 (promoter region deletion/duplication testing only), KIT, MEN1, MLH1, MSH2, MSH3, MSH6, MUTYH, NBN, NF1, NHTL1, PALB2, PDGFRA*, PMS2, POLD1, POLE, PTEN, RAD50, RAD51C, RAD51D, SDHB, SDHC, SDHD, SMAD4, SMARCA4. STK11, TP53, TSC1, TSC2, and VHL.  The following genes were evaluated for sequence changes only: SDHA and HOXB13 c.251G>A variant only.  RNA analysis is not performed for the * genes.       Past Medical History:  Diagnosis Date   Breast cancer ([facility]) 1994   s/p chemo 8/94-1/95, radiation 8/94-11/94   Breast cancer metastasized to bone ([place]) 09/29/2022    Cataracts, bilateral    Dyspnea    Factor 5 Leiden mutation, heterozygous [facility])    also has Factor 8 problems   Headache    younger 53   Hypertension    Lymphedema    right arm   Menopause    chemo induced   Monoallelic mutation of CHEK2 gene in female patient 11/24/2022   Low penetrance CHEK2 c.470T>C (p.Ile157Thr)     Past Surgical History:  Procedure Laterality Date   BREAST LUMPECTOMY Right 1994   w/ radiation and chemotherapy following lumpectomy   BRONCHIAL BIOPSY  11/10/2022   Procedure: BRONCHIAL BIOPSIES;  Surgeon: Juanito Doom, MD;  Location: [facility] ENDOSCOPY;  Service: Cardiopulmonary;;   BRONCHIAL BRUSHINGS  11/10/2022   Procedure: BRONCHIAL BRUSHINGS;  Surgeon: Juanito Doom, MD;  Location[facility]COPY;  Service: Cardiopulmonary;;   BRONCHIAL WASHINGS  11/10/2022   Procedure: BRONCHIAL WASHINGS;  Surgeon: Juanito Doom, MD;  Location: [facility] ENDOSCOPY;  Service: Cardiopulmonary;;   CHOLECYSTECTOMY  06/20/2002   Laparoscopic cholestectomy   varicose veins Bilateral 2013   laser surgery   Dr. Jones Skene   VIDEO BRONCHOSCOPY WITH ENDOBRONCHIAL ULTRASOUND N/A 11/10/2022   Procedure: VIDEO BRONCHOSCOPY WITH ENDOBRONCHIAL ULTRASOUND;  Surgeon: Juanito Doom, MD;  Location: [facility] ENDOSCOPY;  Service: Cardiopulmonary;  Laterality: N/A;    Social History   Socioeconomic History   Marital status: Married    Spouse name: Not on file   Number of children: Not on file   Years of education: Not on file   Highest education level: Not on file  Occupational History   Occupation: retired Product manager: RETIRED  Tobacco Use   Smoking status: Never   Smokeless tobacco: Never  Vaping Use   Vaping Use: Never used  Substance and Sexual Activity   Alcohol use: No   Drug use: No   Sexual activity: Yes    Partners: Male  Other Topics Concern   Not on file  Social History Narrative   Exercise--- walking   Social Determinants of Health   Financial  Resource Strain: Low Risk  (07/20/2021)   Overall Financial Resource Strain (CARDIA)    Difficulty of Paying Living Expenses: Not hard at all  Food Insecurity: No Food Insecurity (07/07/2020)   Hunger Vital Sign    Worried About Running Out of Food in the Last Year: Never true    Ran Out of Food in the Last Year: Never true  Transportation Needs: No Transportation Needs (07/20/2021)   PRAPARE - Hydrologist (Medical): No    Lack of Transportation (Non-Medical): No  Physical Activity: Sufficiently Active (07/20/2021)   Exercise Vital Sign    Days of Exercise per Week: 7 days    Minutes of Exercise per Session: 30 min  Stress: No Stress Concern Present (07/20/2021)   [place]    Feeling of Stress : Not at all  Social Connections: Moderately Isolated (07/20/2021)   Social Connection and Isolation Panel [NHANES]    Frequency of Communication with Friends and Family: More than three times a week    Frequency of Social Gatherings with Friends and Family: More than three times a week    Attends Religious Services: Never    Marine scientist or Organizations: No    Attends Music therapist: Never    Marital Status: Married     FAMILY HISTORY:  We obtained a detailed, 4-generation family history.  Significant diagnoses are listed below: Family History  Problem Relation Age of Onset   Colon cancer Mother 83 - 40   Bladder Cancer Mother 45   Prostate cancer Father    Ms. Hotard's mother was diagnosed with colon cancer in her 8s and bladder cancer at age 51, she died at age 25. Her father died at age 38 and his autopsy noted that he had prostate cancer. Ms. Baka is unaware of previous family history of genetic testing for hereditary cancer risks. There is no reported Ashkenazi Jewish ancestry.      DISCUSSION:  At today's genetic counseling session, we reviewed the results of  her genetic testing.   Ms. Mitchner tested positive for one pathogenic (low penetrance) variant in the CHEK2 gene. Specifically, this variant is p.I157T.  The test report has been scanned into EPIC and is located under the Molecular Pathology section of the Results Review tab.  A portion of the result report is included below for reference. Genetic testing reported out on 11/23/2022.      Cancer Risks for CHEK2 I157T pathogenic variant: Ms. Lewis specific mutation (p.I157T) has been shown to confer a risk for breast cancer that is less than the breast cancer risk for other mutations in the CHEK2 gene. Thus, her mutation is classified as a low penetrance mutation. Studies in [place] of this low penetrance mutation estimated the lifetime risk for breast cancer among carriers to be increased by about 50% over the general population risk.  For those of average breast cancer risk, this low penetrance mutation  in isolation is not expected to increase one's breast cancer risk >20%.  Additionally, both men and women have a 5-10% lifetime risk of colorectal cancer. Men are thought to be at an increased risk of prostate cancer but the exact risk figure is unknown at this time. Data suggests a possible increased risk for ovarian, endometrial, thyroid, and other types of cancer.    Cancer risks in families with the same CHEK2 mutation can vary widely, suggesting that there are likely to be other factors involved that we don't understand yet that also contribute to the cancer risks associated with CHEK2 mutations. An individual's cancer risk and medical management are not determined by genetic test results alone. Overall cancer risk assessment incorporates additional factors, including personal medical history, family history, and any available genetic information that may result in a personalized plan for cancer prevention and surveillance.   Management Recommendations:  Breast Cancer Screening/Risk  Reduction: Low penetrance variants in CHEK2 are not clinically actionable in isolation for breast cancer risk.  Breast surveillance should be based on personalized assessment including family history as a minimum  Colon Cancer Screening: Colonoscopy screening every 5 years beginning at age 86 If an individual has a first-degree relative with colorectal cancer, screening should begin 10 years prior to the relative's age at diagnosis if before 74. If an individual has a personal history of colorectal cancer, screening recommendations should be based on recommendations for post-colorectal cancer resection.  Per ACMG, colon cancer screening should be based on family history and not a CHEK2 p.I157T mutation in isolation. Thus, it is reasonable for some individuals with CHEK2 low penetrance variants to opt for less aggressive screening based on shared decision making with their gastroenterologist.   Prostate Cancer Screening: Consider beginning annual PSA blood test and digital rectal exams at age 55.   This information is based on current understanding of the gene and may change in the future.   Implications for Family Members: Hereditary predisposition to cancer due to pathogenic variants in the CHEK2 gene has autosomal dominant inheritance. This means that an individual with a pathogenic variant has a 50% chance of passing the condition on to his/her offspring. Identification of a pathogenic variant allows for the recognition of at-risk relatives who can pursue testing for the familial variant.   Family members are encouraged to consider genetic testing for this familial pathogenic variant. As there are generally no childhood cancer risks associated with pathogenic variants in the CHEK2 gene, individuals in the family are not recommended to have testing until they reach at least 70 years of age. Complimentary testing for the familial variant is available for 150 days from her report date. They may  contact our office at [phone] for more information or to schedule an appointment. Family members who live outside of the area are encouraged to find a genetic counselor in their area by visiting: [email or web address].   Resources: FORCE (Facing Our Risk of Cancer Empowered) is a resource for those with a hereditary predisposition to develop cancer.  FORCE provides information about risk reduction, advocacy, legislation, and clinical trials.  Additionally, FORCE provides a platform for collaboration and support; which includes: peer navigation, message boards, local support groups, a toll-free helpline, research registry and recruitment, advocate training, published medical research, webinars, brochures, mastectomy photos, and more.  For more information, visit www.facingourrisk.org  Ms. Milling's questions were answered to her satisfaction today. Our contact information was provided should additional questions or concerns arise. Thank you for the referral and allowing  [place] to share in the care of your patient.   Lucille Passy, MS, [facility] Genetic Counselor [email or web address] (P) [phone]  The patient was seen for a total of 35 minutes in face-to-face genetic counseling. The patient was seen alone.  Drs. Lindi Adie and/or Burr Medico were available to discuss this case as needed.   _______________________________________________________________________ For Office Staff:  Number of people involved in session: 1 Was an Intern/ student involved with case: no

## 2022-12-02 ENCOUNTER — Ambulatory Visit: Payer: Medicare PPO | Admitting: Dietician

## 2022-12-02 ENCOUNTER — Inpatient Hospital Stay (HOSPITAL_BASED_OUTPATIENT_CLINIC_OR_DEPARTMENT_OTHER): Payer: Medicare PPO | Admitting: Hematology & Oncology

## 2022-12-02 ENCOUNTER — Encounter: Payer: Self-pay | Admitting: *Deleted

## 2022-12-02 ENCOUNTER — Other Ambulatory Visit: Payer: Self-pay

## 2022-12-02 ENCOUNTER — Inpatient Hospital Stay: Payer: Medicare PPO | Attending: Hematology & Oncology

## 2022-12-02 ENCOUNTER — Inpatient Hospital Stay: Payer: Medicare PPO

## 2022-12-02 ENCOUNTER — Encounter: Payer: Self-pay | Admitting: Hematology & Oncology

## 2022-12-02 ENCOUNTER — Encounter: Payer: Self-pay | Admitting: Genetic Counselor

## 2022-12-02 VITALS — BP 92/54 | HR 86 | Temp 98.2°F | Resp 19 | Ht 66.0 in | Wt 172.0 lb

## 2022-12-02 DIAGNOSIS — C771 Secondary and unspecified malignant neoplasm of intrathoracic lymph nodes: Secondary | ICD-10-CM | POA: Diagnosis not present

## 2022-12-02 DIAGNOSIS — Z923 Personal history of irradiation: Secondary | ICD-10-CM | POA: Insufficient documentation

## 2022-12-02 DIAGNOSIS — C7951 Secondary malignant neoplasm of bone: Secondary | ICD-10-CM | POA: Diagnosis not present

## 2022-12-02 DIAGNOSIS — Z7982 Long term (current) use of aspirin: Secondary | ICD-10-CM | POA: Diagnosis not present

## 2022-12-02 DIAGNOSIS — Z5111 Encounter for antineoplastic chemotherapy: Secondary | ICD-10-CM | POA: Insufficient documentation

## 2022-12-02 DIAGNOSIS — K5900 Constipation, unspecified: Secondary | ICD-10-CM | POA: Diagnosis not present

## 2022-12-02 DIAGNOSIS — R5383 Other fatigue: Secondary | ICD-10-CM | POA: Diagnosis not present

## 2022-12-02 DIAGNOSIS — C50911 Malignant neoplasm of unspecified site of right female breast: Secondary | ICD-10-CM | POA: Diagnosis not present

## 2022-12-02 DIAGNOSIS — C50919 Malignant neoplasm of unspecified site of unspecified female breast: Secondary | ICD-10-CM

## 2022-12-02 DIAGNOSIS — Z79899 Other long term (current) drug therapy: Secondary | ICD-10-CM | POA: Diagnosis not present

## 2022-12-02 LAB — CBC WITH DIFFERENTIAL (CANCER CENTER ONLY)
Abs Immature Granulocytes: 0.01 10*3/uL (ref 0.00–0.07)
Basophils Absolute: 0 10*3/uL (ref 0.0–0.1)
Basophils Relative: 1 %
Eosinophils Absolute: 0.1 10*3/uL (ref 0.0–0.5)
Eosinophils Relative: 4 %
HCT: 38.8 % (ref 36.0–46.0)
Hemoglobin: 12.9 g/dL (ref 12.0–15.0)
Immature Granulocytes: 0 %
Lymphocytes Relative: 7 %
Lymphs Abs: 0.3 10*3/uL — ABNORMAL LOW (ref 0.7–4.0)
MCH: 29.9 pg (ref 26.0–34.0)
MCHC: 33.2 g/dL (ref 30.0–36.0)
MCV: 90 fL (ref 80.0–100.0)
Monocytes Absolute: 0.2 10*3/uL (ref 0.1–1.0)
Monocytes Relative: 5 %
Neutro Abs: 3 10*3/uL (ref 1.7–7.7)
Neutrophils Relative %: 83 %
Platelet Count: 301 10*3/uL (ref 150–400)
RBC: 4.31 MIL/uL (ref 3.87–5.11)
RDW: 15.3 % (ref 11.5–15.5)
WBC Count: 3.6 10*3/uL — ABNORMAL LOW (ref 4.0–10.5)
nRBC: 0 % (ref 0.0–0.2)

## 2022-12-02 LAB — CMP (CANCER CENTER ONLY)
ALT: 36 U/L (ref 0–44)
AST: 34 U/L (ref 15–41)
Albumin: 3.7 g/dL (ref 3.5–5.0)
Alkaline Phosphatase: 263 U/L — ABNORMAL HIGH (ref 38–126)
Anion gap: 9 (ref 5–15)
BUN: 12 mg/dL (ref 8–23)
CO2: 31 mmol/L (ref 22–32)
Calcium: 8.9 mg/dL (ref 8.9–10.3)
Chloride: 97 mmol/L — ABNORMAL LOW (ref 98–111)
Creatinine: 0.97 mg/dL (ref 0.44–1.00)
GFR, Estimated: >60 mL/min (ref >60)
Glucose, Bld: 130 mg/dL — ABNORMAL HIGH (ref 70–99)
Potassium: 3.1 mmol/L — ABNORMAL LOW (ref 3.5–5.1)
Sodium: 137 mmol/L (ref 135–145)
Total Bilirubin: 0.7 mg/dL (ref 0.3–1.2)
Total Protein: 6.5 g/dL (ref 6.5–8.1)

## 2022-12-02 MED ORDER — FULVESTRANT 250 MG/5ML IM SOSY
500.0000 mg | PREFILLED_SYRINGE | Freq: Once | INTRAMUSCULAR | Status: AC
  Administered 2022-12-02: 500 mg via INTRAMUSCULAR
  Filled 2022-12-02: qty 10

## 2022-12-02 NOTE — Progress Notes (Signed)
Nutrition Follow Up: Called patient at home telephone #.    ASSESSMENT:  Patient reports that other than fatigue she is doing well. She said her blood pressure very low today and her meds have been adjusted.  Her sister-in-law had to return home but she left them with plenty of frozen meal in fridge.  She has been trying to do 5 small meals throughout the day.   She and her spouse have been able to get out and shop for each other.  Taking anti-emetic in morning and having no concerns with nausea after that.      Labs:  no new labs   Anthropometrics: Weigh stable  Height: 66" Weight:  12/02/22  172# 11/10/21  172# 10/14/22  180.3# 09/29/22  187# 09/20/22  188.4# 03/18/22  203 UBW: 200 BMI: 27.76   INTERVENTION:   Encouraged continuing with current pattern. Emphasized need to insure she is having protein at most meals. Declined need for any resources, all questions answered.    MONITORING, EVALUATION, GOAL: weight, PO intake, Nutrition Impact Symptoms, labs Goal is weight maintenance.   Next Visit: PRN at patient or provider request    [name], RDN, LDN Registered Dietitian, [place] Part Time Remote (Usual office hours: Tuesday-Thursday) Cell: [phone]

## 2022-12-02 NOTE — Patient Instructions (Signed)

## 2022-12-02 NOTE — Progress Notes (Signed)
Hematology and Oncology Follow Up Visit  Tracy Thompson [id number] 1951/01/23 71 y.o. 12/02/2022   Principle Diagnosis:  Metastatic breast cancer-bone metastasis/hilar lymph node metastasis- ER+?PR+/HER-2 (2+)  Current Therapy:   Faslodex 500 mg IM monthly -start on 11/09/2022 Ribociclib 600 mg p.o. daily (21/7) --start on 11/09/2022 Zometa 4 mg IV every 3 months -next dose on 02/15/2023 Radiation therapy to left hip     Interim History:  Tracy Thompson is back for follow-up.  She does have metastatic breast cancer.  She did have a bronchoscopy and hilar lymph node biopsy.  This confirmed that she had metastatic breast cancer.  The pathology report ([id number]) showed metastatic adenocarcinoma consistent with breast cancer.  She was not felt to be a candidate for surgery for the left hip.  She had radiation therapy to the left hip.  She tolerated this well.  She is now on Faslodex and ribociclib.  She has done okay with this for right now.  She does feel quite fatigued and tired.  I think part of her problem is that she really has a low blood pressure.  I think she is on too many medications.  We are cutting back on her blood pressure medications.  Hopefully, this will make her feel better.  We did do an EKG on her today.  EKG was done because of her being on ribociclib.  The EKG looked unremarkable.  Her last CA 27.29 was 154.  She has had no problems with nausea or vomiting.  There has been some pain issues.  She has had no change in bowel or bladder habits.  She does have some constipation.  There has been no cough or shortness of breath.  Overall, I would say performance status is probably ECOG 1.  Medications:  Current Outpatient Medications:    amLODipine (NORVASC) 2.5 MG tablet, TAKE 1 TABLET(2.5 MG) BY MOUTH DAILY, Disp: 90 tablet, Rfl: 1   aspirin 81 MG tablet, Take 81 mg by mouth daily., Disp: , Rfl:    bisoprolol-hydrochlorothiazide (ZIAC) 10-6.25 MG tablet, TAKE 1  TABLET BY MOUTH DAILY, Disp: 90 tablet, Rfl: 1   cholecalciferol (VITAMIN D) 1000 units tablet, Take 1,000 Units by mouth daily., Disp: , Rfl:    dronabinol (MARINOL) 2.5 MG capsule, Take 1 capsule (2.5 mg total) by mouth 2 (two) times daily before lunch and supper., Disp: 60 capsule, Rfl: 0   hydrochlorothiazide (HYDRODIURIL) 25 MG tablet, Take 1 tablet (25 mg total) by mouth daily., Disp: 90 tablet, Rfl: 1   HYDROcodone-acetaminophen (NORCO/VICODIN) 5-325 MG tablet, Take 1-2 tablets by mouth every 6 (six) hours as needed for moderate pain., Disp: 60 tablet, Rfl: 0   meclizine (ANTIVERT) 12.5 MG tablet, Take 1 tablet (12.5 mg total) by mouth 3 (three) times daily as needed for dizziness., Disp: 30 tablet, Rfl: 4   OLANZapine (ZYPREXA) 5 MG tablet, Take 1 tablet (5 mg total) by mouth at bedtime., Disp: 30 tablet, Rfl: 3   ondansetron (ZOFRAN) 8 MG tablet, Take 1 tablet (8 mg total) by mouth every 8 (eight) hours as needed for nausea or vomiting., Disp: 30 tablet, Rfl: 0   potassium chloride SA (KLOR-CON M) 20 MEQ tablet, TAKE 1 TABLET BY MOUTH DAILY, Disp: 90 tablet, Rfl: 1   prochlorperazine (COMPAZINE) 10 MG tablet, TAKE 1 TABLET(10 MG) BY MOUTH EVERY 6 HOURS AS NEEDED FOR NAUSEA OR VOMITING, Disp: 90 tablet, Rfl: 0   atorvastatin (LIPITOR) 10 MG tablet, TAKE 1 TABLET(10 MG) BY MOUTH DAILY (Patient  not taking: Reported on 12/02/2022), Disp: 90 tablet, Rfl: 1   Coenzyme Q10 200 MG capsule, Take 200 mg by mouth daily. (Patient not taking: Reported on 12/02/2022), Disp: , Rfl:    ribociclib succ (KISQALI 600MG DAILY DOSE) 200 MG Therapy Pack, Take 3 tablets (600 mg total) by mouth daily. Take for 21 days on, 7 days off, repeat every 28 days. (Patient not taking: Reported on 12/02/2022), Disp: 63 tablet, Rfl: 6  Allergies: No Known Allergies  Past Medical History, Surgical history, Social history, and Family History were reviewed and updated.  Review of Systems: Review of Systems  Constitutional:   Positive for fatigue.  HENT:  Negative.    Eyes: Negative.   Respiratory: Negative.    Cardiovascular: Negative.   Gastrointestinal:  Positive for constipation and nausea.  Endocrine: Negative.   Genitourinary: Negative.    Musculoskeletal:  Positive for arthralgias and myalgias.  Skin: Negative.   Neurological: Negative.   Hematological: Negative.   Psychiatric/Behavioral: Negative.      Physical Exam:  height is _0  (1.676 m) and weight is 172 lb (78 kg). Her oral temperature is 98.2 F (36.8 C). Her blood pressure is 92/54 (abnormal) and her pulse is 86. Her respiration is 19 and oxygen saturation is 98%.   Wt Readings from Last 3 Encounters:  12/02/22 172 lb (78 kg)  11/10/22 172 lb (78 kg)  11/04/22 175 lb (79.4 kg)    Physical Exam Vitals reviewed.  HENT:     Head: Normocephalic and atraumatic.  Eyes:     Pupils: Pupils are equal, round, and reactive to light.  Cardiovascular:     Rate and Rhythm: Normal rate and regular rhythm.     Heart sounds: Normal heart sounds.  Pulmonary:     Effort: Pulmonary effort is normal.     Breath sounds: Normal breath sounds.  Abdominal:     General: Bowel sounds are normal.     Palpations: Abdomen is soft.  Musculoskeletal:        General: No tenderness or deformity. Normal range of motion.     Cervical back: Normal range of motion.  Lymphadenopathy:     Cervical: No cervical adenopathy.  Skin:    General: Skin is warm and dry.     Findings: No erythema or rash.  Neurological:     Mental Status: She is alert and oriented to person, place, and time.  Psychiatric:        Behavior: Behavior normal.        Thought Content: Thought content normal.        Judgment: Judgment normal.      Lab Results  Component Value Date   WBC 3.6 (L) 12/02/2022   HGB 12.9 12/02/2022   HCT 38.8 12/02/2022   MCV 90.0 12/02/2022   PLT 301 12/02/2022     Chemistry      Component Value Date/Time   NA 137 12/02/2022 0916   K 3.1 (L)  12/02/2022 0916   CL 97 (L) 12/02/2022 0916   CO2 31 12/02/2022 0916   BUN 12 12/02/2022 0916   CREATININE 0.97 12/02/2022 0916      Component Value Date/Time   CALCIUM 8.9 12/02/2022 0916   ALKPHOS 263 (H) 12/02/2022 0916   AST 34 12/02/2022 0916   ALT 36 12/02/2022 0916   BILITOT 0.7 12/02/2022 0916       Impression and Plan: Tracy Thompson is a very nice 71 year old white female.  She has metastatic breast  cancer.  We have her on antiestrogen therapy.  I had a long talk with her.  I told her that hopefully we will continue her on antiestrogen therapy for several years.  She really has disease confined to her bones.  She had radiation therapy to the left hip.  Hopefully this will help with healing.  Again, her blood pressure medications were too much for her.  We are holding these for right now.  Hopefully this will make her feel better.  I really like to hope that we will get a lot of "mileage" out of antiestrogen therapy.  I would like to see her back in another month.  I told her that we would see her monthly and follow her lab work monthly.   Volanda Napoleon, MD 12/7/20234:39 PM

## 2022-12-03 ENCOUNTER — Encounter: Payer: Self-pay | Admitting: Hematology & Oncology

## 2022-12-03 ENCOUNTER — Encounter: Payer: Self-pay | Admitting: *Deleted

## 2022-12-03 LAB — CANCER ANTIGEN 27.29: CA 27.29: 93.8 U/mL — ABNORMAL HIGH (ref 0.0–38.6)

## 2022-12-03 NOTE — Progress Notes (Signed)
Oncology Nurse Navigator Documentation     12/02/2022   11:00 AM  Oncology Nurse Navigator Flowsheets  Navigator Location [facility]  Navigator Encounter Type Follow-up Appt;Appt/Treatment Plan Review  Patient Visit Type MedOnc  Treatment Phase Active Tx  Barriers/Navigation Needs Coordination of Care;Education  Interventions None Required  Acuity Level 2-Minimal Needs (1-2 Barriers Identified)  Support Groups/Services Friends and Family  Time Spent with Patient 15

## 2022-12-03 NOTE — Progress Notes (Unsigned)
Volanda Napoleon, MD  P Onc Nurse Hp Call and let him know that the cancer marker is already come down by 60 points.  It is now 75.  This is a good sign that the treatment is already working. Thanks.  Laurey Arrow  Called patient an notified her of result. Explained the significance of this marker and frequency of draw.   Patient also had questions about a bill she's received from [employer]. She states she has a bill for $3800. I advised her to call the number provided and speak to them about financial aid. I also explained that [employer] should reach out to patient's once it's determined that insurance will not pay so that they can discuss financial responsibility prior to testing. She will call.   Oncology Nurse Navigator Documentation     12/03/2022   10:30 AM  Oncology Nurse Navigator Flowsheets  Navigator Follow Up Date: 12/30/2022  Navigator Follow Up Reason: Follow-up Appointment;Chemotherapy  Production assistant, radio Encounter Type Telephone  Telephone Diagnostic Results;Outgoing Call  Patient Visit Type MedOnc  Treatment Phase Active Tx  Barriers/Navigation Needs Coordination of Care;Education  Interventions Education;Psycho-Social Support  Acuity Level 2-Minimal Needs (1-2 Barriers Identified)  Education Method Verbal  Support Groups/Services Friends and Family  Time Spent with Patient 15

## 2022-12-07 ENCOUNTER — Other Ambulatory Visit (HOSPITAL_COMMUNITY): Payer: Self-pay

## 2022-12-08 ENCOUNTER — Other Ambulatory Visit: Payer: Self-pay | Admitting: Hematology & Oncology

## 2022-12-08 ENCOUNTER — Encounter: Payer: Self-pay | Admitting: Hematology & Oncology

## 2022-12-09 ENCOUNTER — Encounter: Payer: Self-pay | Admitting: Hematology & Oncology

## 2022-12-09 LAB — FUNGUS CULTURE WITH STAIN

## 2022-12-09 LAB — FUNGUS CULTURE RESULT

## 2022-12-09 LAB — FUNGAL ORGANISM REFLEX

## 2022-12-14 ENCOUNTER — Other Ambulatory Visit: Payer: Self-pay

## 2022-12-16 ENCOUNTER — Other Ambulatory Visit: Payer: Self-pay

## 2022-12-21 ENCOUNTER — Encounter: Payer: Self-pay | Admitting: Radiation Oncology

## 2022-12-21 NOTE — Progress Notes (Signed)
Tracy Thompson presents today for follow up after completing radiation on 11-17-22 for metastatic breast cancer to bone.   Reports intermittent mid-back discomfort and states that she's not able to stay on her feet for extended periods of time. Reports improved energy and appetite. Denies any numbness or weakness to her extremities. Reports constipation has resolved and she is now able to have a small BM daily.   Other issues of note: Overall reports she's feeling much better and in good spirits. Scheduled to see her medical oncologist Dr. Marin Olp next week prior to her Faslodex injection

## 2022-12-22 NOTE — Progress Notes (Signed)
Radiation Oncology         [phone] ________________________________  Name: Tracy Thompson MRN: [id number]  Date: 12/23/2022  DOB: [birth year]  Follow-Up Visit Note  CC: [name], DO  [name], MD    ICD-10-CM   1. Carcinoma of breast metastatic to bone, unspecified laterality ([place])  C50.919    C79.51       Diagnosis: The encounter diagnosis was Carcinoma of breast metastatic to bone, unspecified laterality ([place]).   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 Sites of osseous metastases include the thoracolumbar spine, upper bony pelvis, bilateral femurs, and lower lumbar spine   Interval Since Last Radiation: 1 month and 6 days   Intent: Palliative  Radiation Treatment Dates: 11/03/2022 through 11/17/2022 Site Technique Total Dose (Gy) Dose per Fx (Gy) Completed Fx Beam Energies  Lumbar Spine: Spine 3D 30/30 3 10/10 10X  Hip, Left: Pelvis_L Complex 30/30 3 10/10 15X   Narrative:  The patient returns today for routine follow-up. The patient tolerated radiation therapy relatively well. During her final weekly treatment check on 11/16/22, the patient endorsed back pain, fatigue, nausea (relieved with Compazine), and poor appetite.     In the interval, the patient began Faslodex and ribociclib on 11/09/22 under the care of Dr. Marin Olp. She has tolerated treatment relatively well thus far other than fatigue, tiredness, and hypotension (related to BP medication which have been held accordingly).   She also opted to proceed with bronchoscopy for biopsies of the RLL on 11/10/22. Endobrachial biopsy, RLL brushings, and RLL lavage revealed metastatic adenocarcinoma consistent with breast primary.   On evaluation today the patient reports improvement in her pain in the left pelvis area.  She is able to ambulate much better.  She continues to have some discomfort in the lower back region but overall  improved.  She reports feeling the best she has in over 6 months.  Her energy level is much improved and her appetite is also improved.  Allergies:  has No Known Allergies.  Meds: Current Outpatient Medications  Medication Sig Dispense Refill   amLODipine (NORVASC) 2.5 MG tablet TAKE 1 TABLET(2.5 MG) BY MOUTH DAILY 90 tablet 1   aspirin 81 MG tablet Take 81 mg by mouth daily.     atorvastatin (LIPITOR) 10 MG tablet TAKE 1 TABLET(10 MG) BY MOUTH DAILY (Patient not taking: Reported on 12/02/2022) 90 tablet 1   bisoprolol-hydrochlorothiazide (ZIAC) 10-6.25 MG tablet TAKE 1 TABLET BY MOUTH DAILY 90 tablet 1   cholecalciferol (VITAMIN D) 1000 units tablet Take 1,000 Units by mouth daily.     Coenzyme Q10 200 MG capsule Take 200 mg by mouth daily. (Patient not taking: Reported on 12/02/2022)     dronabinol (MARINOL) 2.5 MG capsule Take 1 capsule (2.5 mg total) by mouth 2 (two) times daily before lunch and supper. 60 capsule 0   hydrochlorothiazide (HYDRODIURIL) 25 MG tablet Take 1 tablet (25 mg total) by mouth daily. 90 tablet 1   HYDROcodone-acetaminophen (NORCO/VICODIN) 5-325 MG tablet Take 1-2 tablets by mouth every 6 (six) hours as needed for moderate pain. 60 tablet 0   meclizine (ANTIVERT) 12.5 MG tablet Take 1 tablet (12.5 mg total) by mouth 3 (three) times daily as needed for dizziness. 30 tablet 4   OLANZapine (ZYPREXA) 5 MG tablet Take 1 tablet (5 mg total) by mouth at bedtime. 30 tablet 3   ondansetron (ZOFRAN) 8 MG tablet TAKE 1 TABLET(8  MG) BY MOUTH EVERY 8 HOURS AS NEEDED FOR NAUSEA OR VOMITING 30 tablet 0   potassium chloride SA (KLOR-CON M) 20 MEQ tablet TAKE 1 TABLET BY MOUTH DAILY 90 tablet 1   prochlorperazine (COMPAZINE) 10 MG tablet TAKE 1 TABLET(10 MG) BY MOUTH EVERY 6 HOURS AS NEEDED FOR NAUSEA OR VOMITING 90 tablet 0   ribociclib succ (KISQALI 600MG  DAILY DOSE) 200 MG Therapy Pack Take 3 tablets (600 mg total) by mouth daily. Take for 21 days on, 7 days off, repeat every 28 days.  (Patient not taking: Reported on 12/02/2022) 63 tablet 6   No current facility-administered medications for this encounter.    Physical Findings: The patient is in no acute distress. Patient is alert and oriented.  height is 5\' 6"  (1.676 m) and weight is 177 lb 9.6 oz (80.6 kg). Her temperature is 96.8 F (36 C) (abnormal). Her blood pressure is 125/69 and her pulse is 84. Her respiration is 18 and oxygen saturation is 97%. .  Lungs are clear to auscultation bilaterally. Heart has regular rate and rhythm. No palpable cervical, supraclavicular, or axillary adenopathy. Abdomen soft, non-tender, normal bowel sounds.  Ambulates with the assistance of a 4 pronged cane   Lab Findings: Lab Results  Component Value Date   WBC 3.6 (L) 12/02/2022   HGB 12.9 12/02/2022   HCT 38.8 12/02/2022   MCV 90.0 12/02/2022   PLT 301 12/02/2022    Radiographic Findings: No results found.  Impression:  The encounter diagnosis was Carcinoma of breast metastatic to bone, unspecified laterality ([place]).   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 Sites of osseous metastases include the thoracolumbar spine, upper bony pelvis, bilateral femurs, and lower lumbar spine   The patient is recovering from the effects of radiation.  Overall she has had good improvement in her pain in the left pelvis and lower back from her palliative radiation therapy.  Plan: As needed follow-up in radiation oncology.  The patient will continue close follow-up in medical oncology and continue on Faslodex and ribociclib.    ____________________________________  Blair Promise, PhD, MD  This document serves as a record of services personally performed by Gery Pray, MD. It was created on his behalf by Roney Mans, a trained medical scribe. The creation of this record is based on the scribe's personal observations and the provider's statements to them. This  document has been checked and approved by the attending provider.

## 2022-12-22 NOTE — Progress Notes (Incomplete)
  Radiation Oncology         [phone] ________________________________  Patient Name: Tracy Thompson MRN: [id number] DOB: 02-19-1951 Referring Physician: Burney Gauze (Profile Not Attached) Date of Service: 11/17/2022 [facility], [place]                                                        End Of Treatment Note  Diagnoses: C79.51-Secondary malignant neoplasm of bone  Cancer Staging: The encounter diagnosis was Carcinoma of breast metastatic to bone, unspecified laterality ([place]).   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 Sites of osseous metastases include the thoracolumbar spine, upper bony pelvis, bilateral femurs, and lower lumbar spine   Intent: Palliative  Radiation Treatment Dates: 11/03/2022 through 11/17/2022 Site Technique Total Dose (Gy) Dose per Fx (Gy) Completed Fx Beam Energies  Lumbar Spine: Spine 3D 30/30 3 10/10 10X  Hip, Left: Pelvis_L Complex 30/30 3 10/10 15X   Narrative: The patient tolerated radiation therapy relatively well. During her final weekly treatment check on 11/16/22, the patient endorsed back pain, fatigue, nausea (relieved with Compazine), and poor appetite.   She reports some improvement in her pain at this point.  Did undergo evaluation of the mediastinal adenopathy as well as right hilar adenopathy.  Biopsy from this area revealed ER positive breast cancer.  She will complete her treatments tomorrow.  Routine follow-up in 1 month.  Her nausea is controlled well with Compazine.  Plan: The patient will follow-up with radiation oncology in one month .  ________________________________________________ -----------------------------------  Blair Promise, PhD, MD  This document serves as a record of services personally performed by Gery Pray, MD. It was created on his behalf by Roney Mans, a trained medical scribe. The creation of this record is  based on the scribe's personal observations and the provider's statements to them. This document has been checked and approved by the attending provider.

## 2022-12-23 ENCOUNTER — Ambulatory Visit
Admission: RE | Admit: 2022-12-23 | Discharge: 2022-12-23 | Disposition: A | Discharge status: Home / Self Care | Payer: Medicare PPO | Source: Ambulatory Visit | Attending: Radiation Oncology | Admitting: Radiation Oncology

## 2022-12-23 VITALS — BP 125/69 | HR 84 | Temp 96.8°F | Resp 18 | Ht 66.0 in | Wt 177.6 lb

## 2022-12-23 DIAGNOSIS — Z7982 Long term (current) use of aspirin: Secondary | ICD-10-CM | POA: Diagnosis not present

## 2022-12-23 DIAGNOSIS — Z923 Personal history of irradiation: Secondary | ICD-10-CM | POA: Insufficient documentation

## 2022-12-23 DIAGNOSIS — C7951 Secondary malignant neoplasm of bone: Secondary | ICD-10-CM | POA: Diagnosis not present

## 2022-12-23 DIAGNOSIS — Z170 Estrogen receptor positive status [ER+]: Secondary | ICD-10-CM | POA: Diagnosis not present

## 2022-12-23 DIAGNOSIS — Z79899 Other long term (current) drug therapy: Secondary | ICD-10-CM | POA: Diagnosis not present

## 2022-12-23 DIAGNOSIS — C50911 Malignant neoplasm of unspecified site of right female breast: Secondary | ICD-10-CM | POA: Insufficient documentation

## 2022-12-23 DIAGNOSIS — C50919 Malignant neoplasm of unspecified site of unspecified female breast: Secondary | ICD-10-CM

## 2022-12-23 HISTORY — DX: Personal history of irradiation: Z92.3

## 2022-12-24 LAB — ACID FAST CULTURE WITH REFLEXED SENSITIVITIES (MYCOBACTERIA): Acid Fast Culture: NEGATIVE

## 2022-12-30 ENCOUNTER — Encounter: Payer: Self-pay | Admitting: Hematology & Oncology

## 2022-12-30 ENCOUNTER — Inpatient Hospital Stay: Payer: Medicare PPO | Attending: Hematology & Oncology

## 2022-12-30 ENCOUNTER — Inpatient Hospital Stay: Payer: Medicare PPO

## 2022-12-30 ENCOUNTER — Inpatient Hospital Stay: Payer: Medicare PPO | Admitting: Hematology & Oncology

## 2022-12-30 VITALS — BP 119/60 | HR 77 | Temp 98.5°F | Resp 18 | Wt 174.8 lb

## 2022-12-30 DIAGNOSIS — Z5111 Encounter for antineoplastic chemotherapy: Secondary | ICD-10-CM | POA: Diagnosis not present

## 2022-12-30 DIAGNOSIS — Z923 Personal history of irradiation: Secondary | ICD-10-CM | POA: Diagnosis not present

## 2022-12-30 DIAGNOSIS — C50919 Malignant neoplasm of unspecified site of unspecified female breast: Secondary | ICD-10-CM | POA: Diagnosis not present

## 2022-12-30 DIAGNOSIS — C7951 Secondary malignant neoplasm of bone: Secondary | ICD-10-CM | POA: Diagnosis not present

## 2022-12-30 DIAGNOSIS — Z79899 Other long term (current) drug therapy: Secondary | ICD-10-CM | POA: Insufficient documentation

## 2022-12-30 DIAGNOSIS — C50911 Malignant neoplasm of unspecified site of right female breast: Secondary | ICD-10-CM | POA: Diagnosis not present

## 2022-12-30 DIAGNOSIS — Z170 Estrogen receptor positive status [ER+]: Secondary | ICD-10-CM | POA: Diagnosis not present

## 2022-12-30 LAB — CMP (CANCER CENTER ONLY)
ALT: 15 U/L (ref 0–44)
AST: 23 U/L (ref 15–41)
Albumin: 3.8 g/dL (ref 3.5–5.0)
Alkaline Phosphatase: 174 U/L — ABNORMAL HIGH (ref 38–126)
Anion gap: 8 (ref 5–15)
BUN: 12 mg/dL (ref 8–23)
CO2: 31 mmol/L (ref 22–32)
Calcium: 8.7 mg/dL — ABNORMAL LOW (ref 8.9–10.3)
Chloride: 101 mmol/L (ref 98–111)
Creatinine: 1.15 mg/dL — ABNORMAL HIGH (ref 0.44–1.00)
GFR, Estimated: 51 mL/min — ABNORMAL LOW (ref >60)
Glucose, Bld: 105 mg/dL — ABNORMAL HIGH (ref 70–99)
Potassium: 3.6 mmol/L (ref 3.5–5.1)
Sodium: 140 mmol/L (ref 135–145)
Total Bilirubin: 0.6 mg/dL (ref 0.3–1.2)
Total Protein: 6.9 g/dL (ref 6.5–8.1)

## 2022-12-30 LAB — CBC WITH DIFFERENTIAL (CANCER CENTER ONLY)
Abs Immature Granulocytes: 0.02 10*3/uL (ref 0.00–0.07)
Basophils Absolute: 0 10*3/uL (ref 0.0–0.1)
Basophils Relative: 2 %
Eosinophils Absolute: 0 10*3/uL (ref 0.0–0.5)
Eosinophils Relative: 2 %
HCT: 34.4 % — ABNORMAL LOW (ref 36.0–46.0)
Hemoglobin: 11.5 g/dL — ABNORMAL LOW (ref 12.0–15.0)
Immature Granulocytes: 1 %
Lymphocytes Relative: 12 %
Lymphs Abs: 0.3 10*3/uL — ABNORMAL LOW (ref 0.7–4.0)
MCH: 32.3 pg (ref 26.0–34.0)
MCHC: 33.4 g/dL (ref 30.0–36.0)
MCV: 96.6 fL (ref 80.0–100.0)
Monocytes Absolute: 0.2 10*3/uL (ref 0.1–1.0)
Monocytes Relative: 9 %
Neutro Abs: 1.7 10*3/uL (ref 1.7–7.7)
Neutrophils Relative %: 74 %
Platelet Count: 311 10*3/uL (ref 150–400)
RBC: 3.56 MIL/uL — ABNORMAL LOW (ref 3.87–5.11)
RDW: 19.9 % — ABNORMAL HIGH (ref 11.5–15.5)
WBC Count: 2.3 10*3/uL — ABNORMAL LOW (ref 4.0–10.5)
nRBC: 0 % (ref 0.0–0.2)

## 2022-12-30 LAB — LACTATE DEHYDROGENASE: LDH: 216 U/L — ABNORMAL HIGH (ref 98–192)

## 2022-12-30 LAB — RETICULOCYTES
Immature Retic Fract: 13.1 % (ref 2.3–15.9)
RBC.: 3.55 MIL/uL — ABNORMAL LOW (ref 3.87–5.11)
Retic Count, Absolute: 74.2 10*3/uL (ref 19.0–186.0)
Retic Ct Pct: 2.1 % (ref 0.4–3.1)

## 2022-12-30 LAB — TSH: TSH: 2.672 u[IU]/mL (ref 0.350–4.500)

## 2022-12-30 LAB — FERRITIN: Ferritin: 299 ng/mL (ref 11–307)

## 2022-12-30 MED ORDER — FULVESTRANT 250 MG/5ML IM SOSY
500.0000 mg | PREFILLED_SYRINGE | Freq: Once | INTRAMUSCULAR | Status: AC
  Administered 2022-12-30: 500 mg via INTRAMUSCULAR

## 2022-12-30 NOTE — Progress Notes (Signed)
Hematology and Oncology Follow Up Visit  BUELAH RENNIE [id number] Sep 02, 1951 72 y.o. 12/30/2022   Principle Diagnosis:  Metastatic breast cancer-bone metastasis/hilar lymph node metastasis- ER+/PR+/HER-2 (2+) -- PIK3CA (+)  Current Therapy:   Faslodex 500 mg IM monthly -start on 11/09/2022 Ribociclib 600 mg p.o. daily (21/7) --start on 11/09/2022 Zometa 4 mg IV every 3 months -next dose on 02/15/2023 Radiation therapy to left hip -completed in 11/23/2022     Interim History:  Ms. Vacca is back for follow-up.  She really looks quite good.  She feels good.  She is not having any pain which is nice to see.  She had a nice Christmas and New Year's.  She was with family and friends.  Her last CA 27.29 had come down to 94.  Will refer saw her, it was 154.  She has had no problems with the ribociclib.  When she for started taking it, she has some nausea.  She was able to take some antinausea medicine before hand and this helped.  She has had no problems with hot flashes or sweats.  There is been no change in bowel or bladder habits.  She has had no cough or shortness of breath.  There has been no bleeding.  She has had no leg swelling.  We probably are due for another PET scan.  We will get 1 the end of January.  Currently, I would have to say that her performance status is probably ECOG 1.  Medications:  Current Outpatient Medications:    amLODipine (NORVASC) 2.5 MG tablet, TAKE 1 TABLET(2.5 MG) BY MOUTH DAILY, Disp: 90 tablet, Rfl: 1   aspirin 81 MG tablet, Take 81 mg by mouth daily., Disp: , Rfl:    bisoprolol-hydrochlorothiazide (ZIAC) 10-6.25 MG tablet, TAKE 1 TABLET BY MOUTH DAILY, Disp: 90 tablet, Rfl: 1   cholecalciferol (VITAMIN D) 1000 units tablet, Take 1,000 Units by mouth daily., Disp: , Rfl:    dronabinol (MARINOL) 2.5 MG capsule, Take 1 capsule (2.5 mg total) by mouth 2 (two) times daily before lunch and supper., Disp: 60 capsule, Rfl: 0   hydrochlorothiazide  (HYDRODIURIL) 25 MG tablet, Take 1 tablet (25 mg total) by mouth daily., Disp: 90 tablet, Rfl: 1   HYDROcodone-acetaminophen (NORCO/VICODIN) 5-325 MG tablet, Take 1-2 tablets by mouth every 6 (six) hours as needed for moderate pain., Disp: 60 tablet, Rfl: 0   meclizine (ANTIVERT) 12.5 MG tablet, Take 1 tablet (12.5 mg total) by mouth 3 (three) times daily as needed for dizziness., Disp: 30 tablet, Rfl: 4   OLANZapine (ZYPREXA) 5 MG tablet, Take 1 tablet (5 mg total) by mouth at bedtime., Disp: 30 tablet, Rfl: 3   ondansetron (ZOFRAN) 8 MG tablet, TAKE 1 TABLET(8 MG) BY MOUTH EVERY 8 HOURS AS NEEDED FOR NAUSEA OR VOMITING, Disp: 30 tablet, Rfl: 0   potassium chloride SA (KLOR-CON M) 20 MEQ tablet, TAKE 1 TABLET BY MOUTH DAILY, Disp: 90 tablet, Rfl: 1   prochlorperazine (COMPAZINE) 10 MG tablet, TAKE 1 TABLET(10 MG) BY MOUTH EVERY 6 HOURS AS NEEDED FOR NAUSEA OR VOMITING, Disp: 90 tablet, Rfl: 0   ribociclib succ (KISQALI 600MG DAILY DOSE) 200 MG Therapy Pack, Take 3 tablets (600 mg total) by mouth daily. Take for 21 days on, 7 days off, repeat every 28 days., Disp: 63 tablet, Rfl: 6   atorvastatin (LIPITOR) 10 MG tablet, TAKE 1 TABLET(10 MG) BY MOUTH DAILY (Patient not taking: Reported on 12/02/2022), Disp: 90 tablet, Rfl: 1   Coenzyme Q10  200 MG capsule, Take 200 mg by mouth daily. (Patient not taking: Reported on 12/02/2022), Disp: , Rfl:   Current Facility-Administered Medications:    fulvestrant (FASLODEX) injection 500 mg, 500 mg, Intramuscular, Once, Mallarie Voorhies, Rudell Cobb, MD  Allergies: No Known Allergies  Past Medical History, Surgical history, Social history, and Family History were reviewed and updated.  Review of Systems: Review of Systems  Constitutional:  Positive for fatigue.  HENT:  Negative.    Eyes: Negative.   Respiratory: Negative.    Cardiovascular: Negative.   Gastrointestinal:  Positive for constipation and nausea.  Endocrine: Negative.   Genitourinary: Negative.     Musculoskeletal:  Positive for arthralgias and myalgias.  Skin: Negative.   Neurological: Negative.   Hematological: Negative.   Psychiatric/Behavioral: Negative.      Physical Exam:  weight is 174 lb 12.8 oz (79.3 kg). Her oral temperature is 98.5 F (36.9 C). Her blood pressure is 119/60 and her pulse is 77. Her respiration is 18 and oxygen saturation is 96%.   Wt Readings from Last 3 Encounters:  12/30/22 174 lb 12.8 oz (79.3 kg)  12/23/22 177 lb 9.6 oz (80.6 kg)  12/02/22 172 lb (78 kg)    Physical Exam Vitals reviewed.  HENT:     Head: Normocephalic and atraumatic.  Eyes:     Pupils: Pupils are equal, round, and reactive to light.  Cardiovascular:     Rate and Rhythm: Normal rate and regular rhythm.     Heart sounds: Normal heart sounds.  Pulmonary:     Effort: Pulmonary effort is normal.     Breath sounds: Normal breath sounds.  Abdominal:     General: Bowel sounds are normal.     Palpations: Abdomen is soft.  Musculoskeletal:        General: No tenderness or deformity. Normal range of motion.     Cervical back: Normal range of motion.  Lymphadenopathy:     Cervical: No cervical adenopathy.  Skin:    General: Skin is warm and dry.     Findings: No erythema or rash.  Neurological:     Mental Status: She is alert and oriented to person, place, and time.  Psychiatric:        Behavior: Behavior normal.        Thought Content: Thought content normal.        Judgment: Judgment normal.     Lab Results  Component Value Date   WBC 2.3 (L) 12/30/2022   HGB 11.5 (L) 12/30/2022   HCT 34.4 (L) 12/30/2022   MCV 96.6 12/30/2022   PLT 311 12/30/2022     Chemistry      Component Value Date/Time   NA 137 12/02/2022 0916   K 3.1 (L) 12/02/2022 0916   CL 97 (L) 12/02/2022 0916   CO2 31 12/02/2022 0916   BUN 12 12/02/2022 0916   CREATININE 0.97 12/02/2022 0916      Component Value Date/Time   CALCIUM 8.9 12/02/2022 0916   ALKPHOS 263 (H) 12/02/2022 0916   AST  34 12/02/2022 0916   ALT 36 12/02/2022 0916   BILITOT 0.7 12/02/2022 0916       Impression and Plan: Ms. Bilger is a very nice 72 year old white female.  She has metastatic breast cancer.  We have her on antiestrogen therapy.  I had a long talk with her.  I told her that hopefully we will continue her on antiestrogen therapy for several years.  She really has disease confined to  her bones.  She had radiation therapy to the left hip.  Hopefully this has helped  with healing.  I noted that her alkaline phosphatase was little bit less today.  Hopefully, this is a sign that her bones are improving.  We will go ahead and plan for a PET scan before see her back.  We will see what her CA 27.29 is.  I will plan to get her back in 1 month.   Volanda Napoleon, MD 1/4/20244:04 PM

## 2022-12-30 NOTE — Patient Instructions (Signed)

## 2022-12-31 ENCOUNTER — Encounter: Payer: Self-pay | Admitting: *Deleted

## 2022-12-31 LAB — IRON AND IRON BINDING CAPACITY (CC-WL,HP ONLY)
Iron: 92 ug/dL (ref 28–170)
Saturation Ratios: 31 % (ref 10.4–31.8)
TIBC: 294 ug/dL (ref 250–450)
UIBC: 202 ug/dL (ref 148–442)

## 2022-12-31 NOTE — Progress Notes (Signed)
Patient here yesterday for follow up. EKG not done, however Dr Marin Olp does not want the patient to come in at this time for EKG.   She does need a PET prior to her next appointment. Scheduled for 01/24/23.  Patient is aware of PET appointment including date, time, and location. The following prep is reviewed with patient and confirmed with teachback: - arrive 30 minutes before appointment time - NPO except water for 6h before scan. No candy, no gum - hold any diabetic medication the morning of the scan - have a low carb dinner the night prior Radiology Information sheet also mailed to patient's home for reinforcement of education.  Oncology Nurse Navigator Documentation     12/31/2022    9:30 AM  Oncology Nurse Navigator Flowsheets  Navigator Follow Up Date: 01/24/2023  Navigator Follow Up Reason: Scan Review  Navigator Location [facility]  Navigator Encounter Type Appt/Treatment Plan Review;Telephone  Telephone Appt Confirmation/Clarification;Education;Outgoing Call  Patient Visit Type MedOnc  Treatment Phase Active Tx  Barriers/Navigation Needs Coordination of Care;Education  Education Other  Interventions Coordination of Care;Education  Acuity Level 2-Minimal Needs (1-2 Barriers Identified)  Coordination of Care Radiology  Education Method Verbal;Written  Support Groups/Services Friends and Family  Time Spent with Patient 30

## 2023-01-01 LAB — CANCER ANTIGEN 27.29: CA 27.29: 83 U/mL — ABNORMAL HIGH (ref 0.0–38.6)

## 2023-01-03 ENCOUNTER — Encounter: Payer: Self-pay | Admitting: *Deleted

## 2023-01-04 ENCOUNTER — Other Ambulatory Visit: Payer: Self-pay

## 2023-01-04 ENCOUNTER — Other Ambulatory Visit (HOSPITAL_COMMUNITY): Payer: Self-pay

## 2023-01-14 ENCOUNTER — Other Ambulatory Visit (HOSPITAL_COMMUNITY): Payer: Self-pay

## 2023-01-16 ENCOUNTER — Other Ambulatory Visit: Payer: Self-pay | Admitting: Family Medicine

## 2023-01-16 DIAGNOSIS — I10 Essential (primary) hypertension: Secondary | ICD-10-CM

## 2023-01-24 ENCOUNTER — Other Ambulatory Visit: Payer: Medicare PPO

## 2023-01-24 ENCOUNTER — Ambulatory Visit (HOSPITAL_COMMUNITY)
Admission: RE | Admit: 2023-01-24 | Discharge: 2023-01-24 | Disposition: A | Discharge status: Home / Self Care | Payer: Medicare PPO | Source: Ambulatory Visit | Attending: Hematology & Oncology | Admitting: Hematology & Oncology

## 2023-01-24 DIAGNOSIS — J90 Pleural effusion, not elsewhere classified: Secondary | ICD-10-CM | POA: Diagnosis not present

## 2023-01-24 DIAGNOSIS — C50919 Malignant neoplasm of unspecified site of unspecified female breast: Secondary | ICD-10-CM | POA: Insufficient documentation

## 2023-01-24 DIAGNOSIS — C7951 Secondary malignant neoplasm of bone: Secondary | ICD-10-CM | POA: Diagnosis not present

## 2023-01-24 DIAGNOSIS — J9811 Atelectasis: Secondary | ICD-10-CM | POA: Diagnosis not present

## 2023-01-24 DIAGNOSIS — S2249XA Multiple fractures of ribs, unspecified side, initial encounter for closed fracture: Secondary | ICD-10-CM | POA: Diagnosis not present

## 2023-01-24 LAB — GLUCOSE, CAPILLARY: Glucose-Capillary: 110 mg/dL — ABNORMAL HIGH (ref 70–99)

## 2023-01-24 MED ORDER — FLUDEOXYGLUCOSE F - 18 (FDG) INJECTION
8.7000 | Freq: Once | INTRAVENOUS | Status: AC | PRN
  Administered 2023-01-24: 8.53 via INTRAVENOUS

## 2023-01-25 ENCOUNTER — Other Ambulatory Visit: Payer: Self-pay | Admitting: Hematology & Oncology

## 2023-01-25 ENCOUNTER — Encounter: Payer: Self-pay | Admitting: *Deleted

## 2023-01-25 NOTE — Progress Notes (Signed)
Reviewed patient's PET which shows a mixed treatment response. She has not yet been scheduled for follow up. Message sent to scheduling.   Oncology Nurse Navigator Documentation     01/25/2023    9:15 AM  Oncology Nurse Navigator Flowsheets  Navigator Follow Up Date: 01/27/2023  Navigator Follow Up Reason: Follow-up Appointment  Navigator Location [facility]  Navigator Encounter Type Scan Review  Patient Visit Type MedOnc  Treatment Phase Active Tx  Barriers/Navigation Needs Coordination of Care;Education  Interventions Coordination of Care  Acuity Level 2-Minimal Needs (1-2 Barriers Identified)  Coordination of Care Appts  Support Groups/Services Friends and Family  Time Spent with Patient 15

## 2023-01-26 ENCOUNTER — Other Ambulatory Visit: Payer: Self-pay

## 2023-01-26 DIAGNOSIS — C50919 Malignant neoplasm of unspecified site of unspecified female breast: Secondary | ICD-10-CM

## 2023-01-27 ENCOUNTER — Encounter: Payer: Self-pay | Admitting: *Deleted

## 2023-01-27 ENCOUNTER — Encounter: Payer: Self-pay | Admitting: Hematology & Oncology

## 2023-01-27 ENCOUNTER — Other Ambulatory Visit: Payer: Self-pay

## 2023-01-27 ENCOUNTER — Inpatient Hospital Stay: Payer: Medicare PPO | Attending: Hematology & Oncology

## 2023-01-27 ENCOUNTER — Other Ambulatory Visit: Payer: Self-pay | Admitting: *Deleted

## 2023-01-27 ENCOUNTER — Inpatient Hospital Stay (HOSPITAL_BASED_OUTPATIENT_CLINIC_OR_DEPARTMENT_OTHER): Payer: Medicare PPO | Admitting: Hematology & Oncology

## 2023-01-27 ENCOUNTER — Inpatient Hospital Stay: Payer: Medicare PPO

## 2023-01-27 VITALS — BP 122/64 | HR 75 | Temp 98.2°F | Resp 18 | Ht 66.0 in | Wt 177.1 lb

## 2023-01-27 DIAGNOSIS — C50911 Malignant neoplasm of unspecified site of right female breast: Secondary | ICD-10-CM | POA: Insufficient documentation

## 2023-01-27 DIAGNOSIS — Z5111 Encounter for antineoplastic chemotherapy: Secondary | ICD-10-CM | POA: Insufficient documentation

## 2023-01-27 DIAGNOSIS — C50919 Malignant neoplasm of unspecified site of unspecified female breast: Secondary | ICD-10-CM

## 2023-01-27 DIAGNOSIS — C7951 Secondary malignant neoplasm of bone: Secondary | ICD-10-CM | POA: Diagnosis not present

## 2023-01-27 DIAGNOSIS — Z1501 Genetic susceptibility to malignant neoplasm of breast: Secondary | ICD-10-CM

## 2023-01-27 DIAGNOSIS — Z170 Estrogen receptor positive status [ER+]: Secondary | ICD-10-CM | POA: Insufficient documentation

## 2023-01-27 DIAGNOSIS — C771 Secondary and unspecified malignant neoplasm of intrathoracic lymph nodes: Secondary | ICD-10-CM | POA: Insufficient documentation

## 2023-01-27 DIAGNOSIS — Z923 Personal history of irradiation: Secondary | ICD-10-CM | POA: Diagnosis not present

## 2023-01-27 DIAGNOSIS — Z79899 Other long term (current) drug therapy: Secondary | ICD-10-CM | POA: Diagnosis not present

## 2023-01-27 DIAGNOSIS — Z7982 Long term (current) use of aspirin: Secondary | ICD-10-CM | POA: Diagnosis not present

## 2023-01-27 LAB — CBC WITH DIFFERENTIAL (CANCER CENTER ONLY)
Abs Immature Granulocytes: 0.01 10*3/uL (ref 0.00–0.07)
Basophils Absolute: 0.1 10*3/uL (ref 0.0–0.1)
Basophils Relative: 2 %
Eosinophils Absolute: 0 10*3/uL (ref 0.0–0.5)
Eosinophils Relative: 2 %
HCT: 32.1 % — ABNORMAL LOW (ref 36.0–46.0)
Hemoglobin: 11.4 g/dL — ABNORMAL LOW (ref 12.0–15.0)
Immature Granulocytes: 0 %
Lymphocytes Relative: 11 %
Lymphs Abs: 0.3 10*3/uL — ABNORMAL LOW (ref 0.7–4.0)
MCH: 35.3 pg — ABNORMAL HIGH (ref 26.0–34.0)
MCHC: 35.5 g/dL (ref 30.0–36.0)
MCV: 99.4 fL (ref 80.0–100.0)
Monocytes Absolute: 0.2 10*3/uL (ref 0.1–1.0)
Monocytes Relative: 9 %
Neutro Abs: 2 10*3/uL (ref 1.7–7.7)
Neutrophils Relative %: 76 %
Platelet Count: 323 10*3/uL (ref 150–400)
RBC: 3.23 MIL/uL — ABNORMAL LOW (ref 3.87–5.11)
RDW: 20.4 % — ABNORMAL HIGH (ref 11.5–15.5)
WBC Count: 2.6 10*3/uL — ABNORMAL LOW (ref 4.0–10.5)
nRBC: 0 % (ref 0.0–0.2)

## 2023-01-27 LAB — RETICULOCYTES
Immature Retic Fract: 17 % — ABNORMAL HIGH (ref 2.3–15.9)
RBC.: 3.36 MIL/uL — ABNORMAL LOW (ref 3.87–5.11)
Retic Count, Absolute: 95.4 10*3/uL (ref 19.0–186.0)
Retic Ct Pct: 2.8 % (ref 0.4–3.1)

## 2023-01-27 LAB — CMP (CANCER CENTER ONLY)
ALT: 16 U/L (ref 0–44)
AST: 21 U/L (ref 15–41)
Albumin: 4 g/dL (ref 3.5–5.0)
Alkaline Phosphatase: 119 U/L (ref 38–126)
Anion gap: 10 (ref 5–15)
BUN: 16 mg/dL (ref 8–23)
CO2: 29 mmol/L (ref 22–32)
Calcium: 9.3 mg/dL (ref 8.9–10.3)
Chloride: 98 mmol/L (ref 98–111)
Creatinine: 1.17 mg/dL — ABNORMAL HIGH (ref 0.44–1.00)
GFR, Estimated: 50 mL/min — ABNORMAL LOW (ref >60)
Glucose, Bld: 116 mg/dL — ABNORMAL HIGH (ref 70–99)
Potassium: 3.1 mmol/L — ABNORMAL LOW (ref 3.5–5.1)
Sodium: 137 mmol/L (ref 135–145)
Total Bilirubin: 0.6 mg/dL (ref 0.3–1.2)
Total Protein: 7.2 g/dL (ref 6.5–8.1)

## 2023-01-27 LAB — FERRITIN: Ferritin: 271 ng/mL (ref 11–307)

## 2023-01-27 LAB — LACTATE DEHYDROGENASE: LDH: 216 U/L — ABNORMAL HIGH (ref 98–192)

## 2023-01-27 LAB — TSH: TSH: 3.623 u[IU]/mL (ref 0.350–4.500)

## 2023-01-27 MED ORDER — FULVESTRANT 250 MG/5ML IM SOSY
500.0000 mg | PREFILLED_SYRINGE | Freq: Once | INTRAMUSCULAR | Status: AC
  Administered 2023-01-27: 500 mg via INTRAMUSCULAR
  Filled 2023-01-27: qty 10

## 2023-01-27 NOTE — Progress Notes (Signed)
Hematology and Oncology Follow Up Visit  Tracy Thompson [id number] May 03, 1951 72 y.o. 01/27/2023   Principle Diagnosis:  Metastatic breast cancer-bone metastasis/hilar lymph node metastasis- ER+/PR+/HER-2 (2+) -- PIK3CA (+)  Current Therapy:   Faslodex 500 mg IM monthly -start on 11/09/2022 Ribociclib 600 mg p.o. daily (21/7) --start on 11/09/2022 Zometa 4 mg IV every 3 months -next dose on 02/15/2023 Radiation therapy to left hip -completed in 11/23/2022     Interim History:  Tracy Thompson is back for follow-up.  Thankfully, she is doing quite well.  However, she did have a little bit of a fall since we last saw her.  We last saw her back in early January.  She apparently was reaching for something and fell.  She hurt her left side.  When she had her PET scan done, there is, that she has subacute fractures on the left some of the left ribs.  However, there is no evidence of active cancer on the PET scan.  As such, she has had a very nice response to treatment.  Her last CA 27.29 was down to 83.  She is doing well with the ribociclib.  She has had no problems with nausea or vomiting.  She has had no issues with cough or shortness of breath.  She has had no bleeding.  There is been no change in bowel or bladder habits..  She has little bit of swelling about the ankle.  This has been somewhat chronic.  Overall, I would say her performance status is probably ECOG 1.   Medications:  Current Outpatient Medications:    amLODipine (NORVASC) 2.5 MG tablet, TAKE 1 TABLET(2.5 MG) BY MOUTH DAILY, Disp: 90 tablet, Rfl: 1   aspirin 81 MG tablet, Take 81 mg by mouth daily., Disp: , Rfl:    bisoprolol-hydrochlorothiazide (ZIAC) 10-6.25 MG tablet, TAKE 1 TABLET BY MOUTH DAILY, Disp: 90 tablet, Rfl: 1   cholecalciferol (VITAMIN D) 1000 units tablet, Take 1,000 Units by mouth daily., Disp: , Rfl:    dronabinol (MARINOL) 2.5 MG capsule, Take 1 capsule (2.5 mg total) by mouth 2 (two) times daily before lunch  and supper., Disp: 60 capsule, Rfl: 0   hydrochlorothiazide (HYDRODIURIL) 25 MG tablet, Take 1 tablet (25 mg total) by mouth daily., Disp: 90 tablet, Rfl: 1   HYDROcodone-acetaminophen (NORCO/VICODIN) 5-325 MG tablet, Take 1-2 tablets by mouth every 6 (six) hours as needed for moderate pain., Disp: 60 tablet, Rfl: 0   meclizine (ANTIVERT) 12.5 MG tablet, Take 1 tablet (12.5 mg total) by mouth 3 (three) times daily as needed for dizziness., Disp: 30 tablet, Rfl: 4   OLANZapine (ZYPREXA) 5 MG tablet, Take 1 tablet (5 mg total) by mouth at bedtime., Disp: 30 tablet, Rfl: 3   ondansetron (ZOFRAN) 8 MG tablet, TAKE 1 TABLET(8 MG) BY MOUTH EVERY 8 HOURS AS NEEDED FOR NAUSEA OR VOMITING, Disp: 30 tablet, Rfl: 0   potassium chloride SA (KLOR-CON M) 20 MEQ tablet, TAKE 1 TABLET BY MOUTH DAILY, Disp: 90 tablet, Rfl: 1   prochlorperazine (COMPAZINE) 10 MG tablet, TAKE 1 TABLET(10 MG) BY MOUTH EVERY 6 HOURS AS NEEDED FOR NAUSEA OR VOMITING, Disp: 90 tablet, Rfl: 0   ribociclib succ (KISQALI 600MG  DAILY DOSE) 200 MG Therapy Pack, Take 3 tablets (600 mg total) by mouth daily. Take for 21 days on, 7 days off, repeat every 28 days., Disp: 63 tablet, Rfl: 6   atorvastatin (LIPITOR) 10 MG tablet, TAKE 1 TABLET(10 MG) BY MOUTH DAILY (Patient not taking:  Reported on 12/02/2022), Disp: 90 tablet, Rfl: 1   Coenzyme Q10 200 MG capsule, Take 200 mg by mouth daily. (Patient not taking: Reported on 12/02/2022), Disp: , Rfl:   Allergies: No Known Allergies  Past Medical History, Surgical history, Social history, and Family History were reviewed and updated.  Review of Systems: Review of Systems  Constitutional:  Positive for fatigue.  HENT:  Negative.    Eyes: Negative.   Respiratory: Negative.    Cardiovascular: Negative.   Gastrointestinal:  Positive for constipation and nausea.  Endocrine: Negative.   Genitourinary: Negative.    Musculoskeletal:  Positive for arthralgias and myalgias.  Skin: Negative.    Neurological: Negative.   Hematological: Negative.   Psychiatric/Behavioral: Negative.      Physical Exam:  height is 5\' 6"  (1.676 m) and weight is 177 lb 1.3 oz (80.3 kg). Her oral temperature is 98.2 F (36.8 C). Her blood pressure is 122/64 and her pulse is 75. Her respiration is 18 and oxygen saturation is 98%.   Wt Readings from Last 3 Encounters:  01/27/23 177 lb 1.3 oz (80.3 kg)  01/24/23 171 lb (77.6 kg)  12/30/22 174 lb 12.8 oz (79.3 kg)    Physical Exam Vitals reviewed.  HENT:     Head: Normocephalic and atraumatic.  Eyes:     Pupils: Pupils are equal, round, and reactive to light.  Cardiovascular:     Rate and Rhythm: Normal rate and regular rhythm.     Heart sounds: Normal heart sounds.  Pulmonary:     Effort: Pulmonary effort is normal.     Breath sounds: Normal breath sounds.  Abdominal:     General: Bowel sounds are normal.     Palpations: Abdomen is soft.  Musculoskeletal:        General: No tenderness or deformity. Normal range of motion.     Cervical back: Normal range of motion.  Lymphadenopathy:     Cervical: No cervical adenopathy.  Skin:    General: Skin is warm and dry.     Findings: No erythema or rash.  Neurological:     Mental Status: She is alert and oriented to person, place, and time.  Psychiatric:        Behavior: Behavior normal.        Thought Content: Thought content normal.        Judgment: Judgment normal.     Lab Results  Component Value Date   WBC 2.6 (L) 01/27/2023   HGB 11.4 (L) 01/27/2023   HCT 32.1 (L) 01/27/2023   MCV 99.4 01/27/2023   PLT 323 01/27/2023     Chemistry      Component Value Date/Time   NA 137 01/27/2023 1210   K 3.1 (L) 01/27/2023 1210   CL 98 01/27/2023 1210   CO2 29 01/27/2023 1210   BUN 16 01/27/2023 1210   CREATININE 1.17 (H) 01/27/2023 1210      Component Value Date/Time   CALCIUM 9.3 01/27/2023 1210   ALKPHOS 119 01/27/2023 1210   AST 21 01/27/2023 1210   ALT 16 01/27/2023 1210    BILITOT 0.6 01/27/2023 1210       Impression and Plan: Tracy Thompson is a very nice 72 year old white female.  She has metastatic breast cancer.  We have her on antiestrogen therapy.  I had a long talk with her.  I told her that hopefully we will continue her on antiestrogen therapy for several years.  She really has disease confined to her bones.  She had radiation therapy to the left hip.    I am happy that the PET scan showed that she had a good response.  I do not think we have to do another PET scan probably until the Spring.  I would think probably April or May would be a good time for another PET scan.  We will follow her CA 27.29.  We will have to see what that shows.  I will plan to get her back in another month.     Volanda Napoleon, MD 2/1/20241:11 PM

## 2023-01-27 NOTE — Progress Notes (Signed)
Patient doing well. Dr Marin Olp states PET scan shows good treatment response. Patient will continue current treatment. No scans at this time.  Oncology Nurse Navigator Documentation     01/27/2023    1:00 PM  Oncology Nurse Navigator Flowsheets  Navigator Follow Up Date: 02/24/2023  Navigator Follow Up Reason: Follow-up Appointment  Navigator Location [facility]  Navigator Encounter Type Appt/Treatment Plan Review  Patient Visit Type MedOnc  Treatment Phase Active Tx  Barriers/Navigation Needs No Barriers At This Time  Interventions None Required  Acuity Level 1-No Barriers  Support Groups/Services Friends and Family  Time Spent with Patient 15

## 2023-01-27 NOTE — Patient Instructions (Signed)

## 2023-01-28 ENCOUNTER — Telehealth: Payer: Self-pay

## 2023-01-28 ENCOUNTER — Encounter: Payer: Self-pay | Admitting: Hematology & Oncology

## 2023-01-28 LAB — CANCER ANTIGEN 27.29: CA 27.29: 79.2 U/mL — ABNORMAL HIGH (ref 0.0–38.6)

## 2023-01-28 NOTE — Telephone Encounter (Signed)
-----   Message from Volanda Napoleon, MD sent at 01/28/2023 11:24 AM EST ----- Call and let her know that the cancer level has come down to 79.  We are still in good shape.  Thanks.  Laurey Arrow

## 2023-02-02 ENCOUNTER — Other Ambulatory Visit (HOSPITAL_COMMUNITY): Payer: Self-pay

## 2023-02-07 ENCOUNTER — Other Ambulatory Visit: Payer: Self-pay

## 2023-02-15 ENCOUNTER — Other Ambulatory Visit: Payer: Self-pay | Admitting: Hematology & Oncology

## 2023-02-24 ENCOUNTER — Encounter: Payer: Self-pay | Admitting: Hematology & Oncology

## 2023-02-24 ENCOUNTER — Encounter: Payer: Self-pay | Admitting: *Deleted

## 2023-02-24 ENCOUNTER — Inpatient Hospital Stay: Payer: Medicare PPO

## 2023-02-24 ENCOUNTER — Inpatient Hospital Stay: Payer: Medicare PPO | Admitting: Hematology & Oncology

## 2023-02-24 VITALS — BP 112/70 | HR 68 | Temp 98.2°F | Resp 20

## 2023-02-24 VITALS — BP 113/66 | HR 77 | Temp 98.1°F | Resp 20 | Ht 66.0 in | Wt 176.8 lb

## 2023-02-24 DIAGNOSIS — C50919 Malignant neoplasm of unspecified site of unspecified female breast: Secondary | ICD-10-CM

## 2023-02-24 DIAGNOSIS — Z923 Personal history of irradiation: Secondary | ICD-10-CM | POA: Diagnosis not present

## 2023-02-24 DIAGNOSIS — C50912 Malignant neoplasm of unspecified site of left female breast: Secondary | ICD-10-CM

## 2023-02-24 DIAGNOSIS — Z7982 Long term (current) use of aspirin: Secondary | ICD-10-CM | POA: Diagnosis not present

## 2023-02-24 DIAGNOSIS — C50911 Malignant neoplasm of unspecified site of right female breast: Secondary | ICD-10-CM | POA: Diagnosis not present

## 2023-02-24 DIAGNOSIS — Z5111 Encounter for antineoplastic chemotherapy: Secondary | ICD-10-CM | POA: Diagnosis not present

## 2023-02-24 DIAGNOSIS — C771 Secondary and unspecified malignant neoplasm of intrathoracic lymph nodes: Secondary | ICD-10-CM | POA: Diagnosis not present

## 2023-02-24 DIAGNOSIS — C7951 Secondary malignant neoplasm of bone: Secondary | ICD-10-CM | POA: Diagnosis not present

## 2023-02-24 DIAGNOSIS — Z170 Estrogen receptor positive status [ER+]: Secondary | ICD-10-CM | POA: Diagnosis not present

## 2023-02-24 DIAGNOSIS — Z79899 Other long term (current) drug therapy: Secondary | ICD-10-CM | POA: Diagnosis not present

## 2023-02-24 LAB — CMP (CANCER CENTER ONLY)
ALT: 12 U/L (ref 0–44)
AST: 18 U/L (ref 15–41)
Albumin: 4 g/dL (ref 3.5–5.0)
Alkaline Phosphatase: 114 U/L (ref 38–126)
Anion gap: 10 (ref 5–15)
BUN: 13 mg/dL (ref 8–23)
CO2: 30 mmol/L (ref 22–32)
Calcium: 9.7 mg/dL (ref 8.9–10.3)
Chloride: 99 mmol/L (ref 98–111)
Creatinine: 1.17 mg/dL — ABNORMAL HIGH (ref 0.44–1.00)
GFR, Estimated: 50 mL/min — ABNORMAL LOW (ref >60)
Glucose, Bld: 103 mg/dL — ABNORMAL HIGH (ref 70–99)
Potassium: 3.2 mmol/L — ABNORMAL LOW (ref 3.5–5.1)
Sodium: 139 mmol/L (ref 135–145)
Total Bilirubin: 0.6 mg/dL (ref 0.3–1.2)
Total Protein: 6.7 g/dL (ref 6.5–8.1)

## 2023-02-24 LAB — CBC WITH DIFFERENTIAL (CANCER CENTER ONLY)
Abs Immature Granulocytes: 0.01 10*3/uL (ref 0.00–0.07)
Basophils Absolute: 0 10*3/uL (ref 0.0–0.1)
Basophils Relative: 1 %
Eosinophils Absolute: 0 10*3/uL (ref 0.0–0.5)
Eosinophils Relative: 1 %
HCT: 32.8 % — ABNORMAL LOW (ref 36.0–46.0)
Hemoglobin: 11.6 g/dL — ABNORMAL LOW (ref 12.0–15.0)
Immature Granulocytes: 0 %
Lymphocytes Relative: 9 %
Lymphs Abs: 0.2 10*3/uL — ABNORMAL LOW (ref 0.7–4.0)
MCH: 36.9 pg — ABNORMAL HIGH (ref 26.0–34.0)
MCHC: 35.4 g/dL (ref 30.0–36.0)
MCV: 104.5 fL — ABNORMAL HIGH (ref 80.0–100.0)
Monocytes Absolute: 0.2 10*3/uL (ref 0.1–1.0)
Monocytes Relative: 9 %
Neutro Abs: 2 10*3/uL (ref 1.7–7.7)
Neutrophils Relative %: 80 %
Platelet Count: 282 10*3/uL (ref 150–400)
RBC: 3.14 MIL/uL — ABNORMAL LOW (ref 3.87–5.11)
RDW: 18.1 % — ABNORMAL HIGH (ref 11.5–15.5)
WBC Count: 2.5 10*3/uL — ABNORMAL LOW (ref 4.0–10.5)
nRBC: 0 % (ref 0.0–0.2)

## 2023-02-24 MED ORDER — SODIUM CHLORIDE 0.9 % IV SOLN: Freq: Once | INTRAVENOUS | Status: AC

## 2023-02-24 MED ORDER — ZOLEDRONIC ACID 4 MG/100ML IV SOLN
4.0000 mg | Freq: Once | INTRAVENOUS | Status: AC
  Administered 2023-02-24: 4 mg via INTRAVENOUS
  Filled 2023-02-24: qty 100

## 2023-02-24 MED ORDER — ONDANSETRON HCL 8 MG PO TABS: ORAL_TABLET | ORAL | 2 refills | Status: DC

## 2023-02-24 MED ORDER — FULVESTRANT 250 MG/5ML IM SOSY
500.0000 mg | PREFILLED_SYRINGE | Freq: Once | INTRAMUSCULAR | Status: AC
  Administered 2023-02-24: 500 mg via INTRAMUSCULAR
  Filled 2023-02-24: qty 10

## 2023-02-24 NOTE — Progress Notes (Signed)
Hematology and Oncology Follow Up Visit  Tracy Thompson [id number] 04/15/1951 72 y.o. 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 daily (21/7) --start on 11/09/2022 Zometa 4 mg IV every 3 months -next dose on 04/2023 Radiation therapy to left hip -completed in 11/23/2022     Interim History:  Tracy Thompson is back for follow-up.  So far, things seem to be going pretty well for her.  She still has little bit of pain in the lower back.  She really does not take anything for this.  She manages pretty well overall.  Her last CA 27.29 was down to 79.  As such, she continues to have a nice decline in the tumor marker.  She is done well with the ribociclib.  She has had a little bit of nausea with this.  She takes Zofran.  She has had no change in bowel or bladder habits.  There is no cough or shortness of breath.  She has had little bit of leg swelling, but  this is chronic.  She has had no bleeding.  There is no headache.  Overall, I would say that her performance status is probably ECOG 1.    Medications:  Current Outpatient Medications:    amLODipine (NORVASC) 2.5 MG tablet, TAKE 1 TABLET(2.5 MG) BY MOUTH DAILY, Disp: 90 tablet, Rfl: 1   aspirin 81 MG tablet, Take 81 mg by mouth daily., Disp: , Rfl:    bisoprolol-hydrochlorothiazide (ZIAC) 10-6.25 MG tablet, TAKE 1 TABLET BY MOUTH DAILY, Disp: 90 tablet, Rfl: 1   cholecalciferol (VITAMIN D) 1000 units tablet, Take 1,000 Units by mouth daily., Disp: , Rfl:    hydrochlorothiazide (HYDRODIURIL) 25 MG tablet, Take 1 tablet (25 mg total) by mouth daily., Disp: 90 tablet, Rfl: 1   meclizine (ANTIVERT) 12.5 MG tablet, Take 1 tablet (12.5 mg total) by mouth 3 (three) times daily as needed for dizziness., Disp: 30 tablet, Rfl: 4   OLANZapine (ZYPREXA) 5 MG tablet,  TAKE 1 TABLET(5 MG) BY MOUTH AT BEDTIME, Disp: 30 tablet, Rfl: 3   potassium chloride SA (KLOR-CON M) 20 MEQ tablet, TAKE 1 TABLET BY MOUTH DAILY, Disp: 90 tablet, Rfl: 1   ribociclib succ (KISQALI '600MG'$  DAILY DOSE) 200 MG Therapy Pack, Take 3 tablets (600 mg total) by mouth daily. Take for 21 days on, 7 days off, repeat every 28 days., Disp: 63 tablet, Rfl: 6   Coenzyme Q10 200 MG capsule, Take 200 mg by mouth daily. (Patient not taking: Reported on 12/02/2022), Disp: , Rfl:    dronabinol (MARINOL) 2.5 MG capsule, Take 1 capsule (2.5 mg total) by mouth 2 (two) times daily before lunch and supper. (Patient not taking: Reported on 02/24/2023), Disp: 60 capsule, Rfl: 0   HYDROcodone-acetaminophen (NORCO/VICODIN) 5-325 MG tablet, Take 1-2 tablets by mouth every 6 (six) hours as needed for moderate pain. (Patient not taking: Reported on 02/24/2023), Disp: 60 tablet, Rfl: 0   ondansetron (ZOFRAN) 8 MG tablet, TAKE 1 TABLET(8 MG) BY MOUTH EVERY 8 HOURS AS NEEDED FOR NAUSEA OR VOMITING, Disp: 30 tablet, Rfl: 2   prochlorperazine (COMPAZINE) 10 MG tablet, TAKE 1 TABLET(10 MG) BY MOUTH EVERY 6 HOURS AS NEEDED FOR NAUSEA OR VOMITING (Patient not taking: Reported on 02/24/2023), Disp: 90 tablet, Rfl: 0  Allergies: No Known Allergies  Past Medical History, Surgical history, Social history,  and Family History were reviewed and updated.  Review of Systems: Review of Systems  Constitutional:  Positive for fatigue.  HENT:  Negative.    Eyes: Negative.   Respiratory: Negative.    Cardiovascular: Negative.   Gastrointestinal:  Positive for constipation and nausea.  Endocrine: Negative.   Genitourinary: Negative.    Musculoskeletal:  Positive for arthralgias and myalgias.  Skin: Negative.   Neurological: Negative.   Hematological: Negative.   Psychiatric/Behavioral: Negative.      Physical Exam:  height is '5\' 6"'$  (1.676 m) and weight is 176 lb 12.8 oz (80.2 kg). Her oral temperature is 98.1 F (36.7 C). Her  blood pressure is 113/66 and her pulse is 77. Her respiration is 20 and oxygen saturation is 99%.   Wt Readings from Last 3 Encounters:  02/24/23 176 lb 12.8 oz (80.2 kg)  01/27/23 177 lb 1.3 oz (80.3 kg)  01/24/23 171 lb (77.6 kg)    Physical Exam Vitals reviewed.  HENT:     Head: Normocephalic and atraumatic.  Eyes:     Pupils: Pupils are equal, round, and reactive to light.  Cardiovascular:     Rate and Rhythm: Normal rate and regular rhythm.     Heart sounds: Normal heart sounds.  Pulmonary:     Effort: Pulmonary effort is normal.     Breath sounds: Normal breath sounds.  Abdominal:     General: Bowel sounds are normal.     Palpations: Abdomen is soft.  Musculoskeletal:        General: No tenderness or deformity. Normal range of motion.     Cervical back: Normal range of motion.  Lymphadenopathy:     Cervical: No cervical adenopathy.  Skin:    General: Skin is warm and dry.     Findings: No erythema or rash.  Neurological:     Mental Status: She is alert and oriented to person, place, and time.  Psychiatric:        Behavior: Behavior normal.        Thought Content: Thought content normal.        Judgment: Judgment normal.      Lab Results  Component Value Date   WBC 2.5 (L) 02/24/2023   HGB 11.6 (L) 02/24/2023   HCT 32.8 (L) 02/24/2023   MCV 104.5 (H) 02/24/2023   PLT 282 02/24/2023     Chemistry      Component Value Date/Time   NA 139 02/24/2023 0930   K 3.2 (L) 02/24/2023 0930   CL 99 02/24/2023 0930   CO2 30 02/24/2023 0930   BUN 13 02/24/2023 0930   CREATININE 1.17 (H) 02/24/2023 0930      Component Value Date/Time   CALCIUM 9.7 02/24/2023 0930   ALKPHOS 114 02/24/2023 0930   AST 18 02/24/2023 0930   ALT 12 02/24/2023 0930   BILITOT 0.6 02/24/2023 0930       Impression and Plan: Tracy Thompson is a very nice 72 year old white female.  She has metastatic breast cancer.  We have her on antiestrogen therapy.  So far, Abdoulaye that everything is  still going okay.  We will have to see with the CA 27.29 is.  Will will go ahead and give her Zometa today.  Will have her come back in 1 month.  If she has any issues with respect to her lower back, we can certainly do some x-rays if necessary.    Volanda Napoleon, MD 2/29/202410:25 AM

## 2023-02-24 NOTE — Progress Notes (Signed)
Patient doing well on her current treatment. No changes to plan at this time.   Oncology Nurse Navigator Documentation     02/24/2023   10:30 AM  Oncology Nurse Navigator Flowsheets  Navigator Follow Up Date: 03/23/2023  Navigator Follow Up Reason: Follow-up Appointment  Navigator Location [facility]  Navigator Encounter Type Appt/Treatment Plan Review  Patient Visit Type MedOnc  Treatment Phase Active Tx  Barriers/Navigation Needs No Barriers At This Time  Interventions None Required  Acuity Level 1-No Barriers  Support Groups/Services Friends and Family  Time Spent with Patient 15

## 2023-02-24 NOTE — Patient Instructions (Addendum)
Fulvestrant Injection What is this medication? FULVESTRANT (ful VES trant) treats breast cancer. It works by blocking the hormone estrogen in breast tissue, which prevents breast cancer cells from spreading or growing. This medicine may be used for other purposes; ask your health care provider or pharmacist if you have questions. COMMON BRAND NAME(S): FASLODEX What should I tell my care team before I take this medication? They need to know if you have any of these conditions:    Bleeding disorder Liver disease Low blood cell levels, such as low white cells, red cells, and platelets An unusual or allergic reaction to fulvestrant, other medications, foods, dyes, or preservatives Pregnant or trying to get pregnant Breast-feeding How should I use this medication? This medication is injected into a muscle. It is given by your care team in a hospital or clinic setting. Talk to your care team about the use of this medication in children. Special care may be needed. Overdosage: If you think you have taken too much of this medicine contact a poison control center or emergency room at once. NOTE: This medicine is only for you. Do not share this medicine with others. What if I miss a dose? Keep appointments for follow-up doses. It is important not to miss your dose. Call your care team if you are unable to keep an appointment. What may interact with this medication? Certain medications that prevent or treat blood clots, such as warfarin, enoxaparin, dalteparin, apixaban, dabigatran, rivaroxaban This list may not describe all possible interactions. Give your health care provider a list of all the medicines, herbs, non-prescription drugs, or dietary supplements you use. Also tell them if you smoke, drink alcohol, or use illegal drugs. Some items may interact with your medicine. What should I watch for while using this medication? Your condition will be monitored carefully while you are receiving this  medication. You may need blood work while taking this medication. Talk to your care team if you may be pregnant. Serious birth defects can occur if you take this medication during pregnancy and for 1 year after the last dose. You will need a negative pregnancy test before starting this medication. Contraception is recommended while taking this medication and for 1 year after the last dose. Your care team can help you find the option that works for you. Do not breastfeed while taking this medication and for 1 year after the last dose. This medication may cause infertility. Talk to your care team if you are concerned about your fertility. What side effects may I notice from receiving this medication? Side effects that you should report to your care team as soon as possible: Allergic reactions or angioedema--skin rash, itching or hives, swelling of the face, eyes, lips, tongue, arms, or legs, trouble swallowing or breathing Pain, tingling, or numbness in the hands or feet Side effects that usually do not require medical attention (report to your care team if they continue or are bothersome): Bone, joint, or muscle pain Constipation Headache Hot flashes Nausea Pain, redness, or irritation at injection site Unusual weakness or fatigue This list may not describe all possible side effects. Call your doctor for medical advice about side effects. You may report side effects to FDA at 1-800-FDA-1088. Where should I keep my medication? This medication is given in a hospital or clinic. It will not be stored at home. NOTE: This sheet is a summary. It may not cover all possible information. If you have questions about this medicine, talk to your doctor, pharmacist, or  health care provider.  2023 Elsevier/Gold Standard (2008-02-03 00:00:00) Zoledronic Acid Injection (Cancer) What is this medication? ZOLEDRONIC ACID (ZOE le dron ik AS id) treats high calcium levels in the blood caused by cancer. It may also  be used with chemotherapy to treat weakened bones caused by cancer. It works by slowing down the release of calcium from bones. This lowers calcium levels in your blood. It also makes your bones stronger and less likely to break (fracture). It belongs to a group of medications called bisphosphonates. This medicine may be used for other purposes; ask your health care provider or pharmacist if you have questions. COMMON BRAND NAME(S): Zometa, Zometa Powder What should I tell my care team before I take this medication? They need to know if you have any of these conditions: Dehydration Dental disease Kidney disease Liver disease Low levels of calcium in the blood Lung or breathing disease, such as asthma Receiving steroids, such as dexamethasone or prednisone An unusual or allergic reaction to zoledronic acid, other medications, foods, dyes, or preservatives Pregnant or trying to get pregnant Breast-feeding How should I use this medication? This medication is injected into a vein. It is given by your care team in a hospital or clinic setting. Talk to your care team about the use of this medication in children. Special care may be needed. Overdosage: If you think you have taken too much of this medicine contact a poison control center or emergency room at once. NOTE: This medicine is only for you. Do not share this medicine with others. What if I miss a dose? Keep appointments for follow-up doses. It is important not to miss your dose. Call your care team if you are unable to keep an appointment. What may interact with this medication? Certain antibiotics given by injection Diuretics, such as bumetanide, furosemide NSAIDs, medications for pain and inflammation, such as ibuprofen or naproxen Teriparatide Thalidomide This list may not describe all possible interactions. Give your health care provider a list of all the medicines, herbs, non-prescription drugs, or dietary supplements you use. Also  tell them if you smoke, drink alcohol, or use illegal drugs. Some items may interact with your medicine. What should I watch for while using this medication? Visit your care team for regular checks on your progress. It may be some time before you see the benefit from this medication. Some people who take this medication have severe bone, joint, or muscle pain. This medication may also increase your risk for jaw problems or a broken thigh bone. Tell your care team right away if you have severe pain in your jaw, bones, joints, or muscles. Tell you care team if you have any pain that does not go away or that gets worse. Tell your dentist and dental surgeon that you are taking this medication. You should not have major dental surgery while on this medication. See your dentist to have a dental exam and fix any dental problems before starting this medication. Take good care of your teeth while on this medication. Make sure you see your dentist for regular follow-up appointments. You should make sure you get enough calcium and vitamin D while you are taking this medication. Discuss the foods you eat and the vitamins you take with your care team. Check with your care team if you have severe diarrhea, nausea, and vomiting, or if you sweat a lot. The loss of too much body fluid may make it dangerous for you to take this medication. You may need bloodwork  while taking this medication. Talk to your care team if you wish to become pregnant or think you might be pregnant. This medication can cause serious birth defects. What side effects may I notice from receiving this medication? Side effects that you should report to your care team as soon as possible: Allergic reactions--skin rash, itching, hives, swelling of the face, lips, tongue, or throat Kidney injury--decrease in the amount of urine, swelling of the ankles, hands, or feet Low calcium level--muscle pain or cramps, confusion, tingling, or numbness in the  hands or feet Osteonecrosis of the jaw--pain, swelling, or redness in the mouth, numbness of the jaw, poor healing after dental work, unusual discharge from the mouth, visible bones in the mouth Severe bone, joint, or muscle pain Side effects that usually do not require medical attention (report to your care team if they continue or are bothersome): Constipation Fatigue Fever Loss of appetite Nausea Stomach pain This list may not describe all possible side effects. Call your doctor for medical advice about side effects. You may report side effects to FDA at 1-800-FDA-1088. Where should I keep my medication? This medication is given in a hospital or clinic. It will not be stored at home. NOTE: This sheet is a summary. It may not cover all possible information. If you have questions about this medicine, talk to your doctor, pharmacist, or health care provider.  2023 Elsevier/Gold Standard (2008-02-03 00:00:00)

## 2023-02-25 ENCOUNTER — Encounter: Payer: Self-pay | Admitting: Hematology & Oncology

## 2023-02-25 LAB — CANCER ANTIGEN 27.29: CA 27.29: 82.6 U/mL — ABNORMAL HIGH (ref 0.0–38.6)

## 2023-03-08 ENCOUNTER — Other Ambulatory Visit (HOSPITAL_COMMUNITY): Payer: Self-pay

## 2023-03-11 ENCOUNTER — Ambulatory Visit: Payer: Medicare PPO | Admitting: Family Medicine

## 2023-03-11 ENCOUNTER — Telehealth: Payer: Self-pay | Admitting: Family Medicine

## 2023-03-11 ENCOUNTER — Other Ambulatory Visit: Payer: Self-pay

## 2023-03-11 ENCOUNTER — Encounter: Payer: Self-pay | Admitting: Family Medicine

## 2023-03-11 VITALS — BP 114/78 | HR 75 | Temp 97.8°F | Resp 18 | Ht 66.0 in | Wt 172.8 lb

## 2023-03-11 DIAGNOSIS — N611 Abscess of the breast and nipple: Secondary | ICD-10-CM | POA: Diagnosis not present

## 2023-03-11 DIAGNOSIS — N644 Mastodynia: Secondary | ICD-10-CM

## 2023-03-11 DIAGNOSIS — E785 Hyperlipidemia, unspecified: Secondary | ICD-10-CM | POA: Diagnosis not present

## 2023-03-11 DIAGNOSIS — I10 Essential (primary) hypertension: Secondary | ICD-10-CM

## 2023-03-11 LAB — LIPID PANEL
Cholesterol: 207 mg/dL — ABNORMAL HIGH (ref 0–200)
HDL: 64.8 mg/dL (ref >39.00)
LDL Cholesterol: 123 mg/dL — ABNORMAL HIGH (ref 0–99)
NonHDL: 142.41
Total CHOL/HDL Ratio: 3
Triglycerides: 96 mg/dL (ref 0.0–149.0)
VLDL: 19.2 mg/dL (ref 0.0–40.0)

## 2023-03-11 LAB — COMPREHENSIVE METABOLIC PANEL
ALT: 14 U/L (ref 0–35)
AST: 20 U/L (ref 0–37)
Albumin: 3.7 g/dL (ref 3.5–5.2)
Alkaline Phosphatase: 111 U/L (ref 39–117)
BUN: 18 mg/dL (ref 6–23)
CO2: 29 mEq/L (ref 19–32)
Calcium: 8.5 mg/dL (ref 8.4–10.5)
Chloride: 98 mEq/L (ref 96–112)
Creatinine, Ser: 1.19 mg/dL (ref 0.40–1.20)
GFR: 46.33 mL/min — ABNORMAL LOW (ref >60.00)
Glucose, Bld: 99 mg/dL (ref 70–99)
Potassium: 3.3 mEq/L — ABNORMAL LOW (ref 3.5–5.1)
Sodium: 138 mEq/L (ref 135–145)
Total Bilirubin: 0.9 mg/dL (ref 0.2–1.2)
Total Protein: 6.7 g/dL (ref 6.0–8.3)

## 2023-03-11 LAB — CBC WITH DIFFERENTIAL/PLATELET
Basophils Absolute: 0 10*3/uL (ref 0.0–0.1)
Basophils Relative: 1.1 % (ref 0.0–3.0)
Eosinophils Absolute: 0 10*3/uL (ref 0.0–0.7)
Eosinophils Relative: 0.7 % (ref 0.0–5.0)
HCT: 35.6 % — ABNORMAL LOW (ref 36.0–46.0)
Hemoglobin: 12.8 g/dL (ref 12.0–15.0)
Lymphocytes Relative: 10.7 % — ABNORMAL LOW (ref 12.0–46.0)
Lymphs Abs: 0.2 10*3/uL — ABNORMAL LOW (ref 0.7–4.0)
MCHC: 35.9 g/dL (ref 30.0–36.0)
MCV: 110.8 fl — ABNORMAL HIGH (ref 78.0–100.0)
Monocytes Absolute: 0.2 10*3/uL (ref 0.1–1.0)
Monocytes Relative: 8.4 % (ref 3.0–12.0)
Neutro Abs: 1.4 10*3/uL (ref 1.4–7.7)
Neutrophils Relative %: 79.1 % — ABNORMAL HIGH (ref 43.0–77.0)
Platelets: 222 10*3/uL (ref 150.0–400.0)
RBC: 3.21 Mil/uL — ABNORMAL LOW (ref 3.87–5.11)
RDW: 19.6 % — ABNORMAL HIGH (ref 11.5–15.5)
WBC: 1.8 10*3/uL — CL (ref 4.0–10.5)

## 2023-03-11 MED ORDER — DOXYCYCLINE HYCLATE 100 MG PO TABS: 100.0000 mg | ORAL_TABLET | Freq: Two times a day (BID) | ORAL | 0 refills | Status: DC

## 2023-03-11 NOTE — Telephone Encounter (Signed)
Critical WBC 1.8 Will forward message to PCP

## 2023-03-11 NOTE — Progress Notes (Addendum)
Subjective:   By signing my name below, I, Tracy Thompson, attest that this documentation has been prepared under the direction and in the presence of [name], DO 03/11/23   Patient ID: Tracy Thompson, female    DOB: [birth year], 72 y.o.   MRN: [id number]  Chief Complaint  Patient presents with   Hypertension   Follow-up   Breast Problem    Right breast, scar tissue that is coming through the skin    Hypertension Pertinent negatives include no blurred vision, chest pain, headaches, malaise/fatigue, palpitations or shortness of breath.   Patient is in today for a 6 month follow up.   She complains of scar tissue protruding the skin on her right breast. She endorses pain and soreness for the past couple of years but the protrusion of the skin began about 6 months ago. She reports having a lumpectomy 30 years ago in 1994 by Dr. Lucia Gaskins. Her first prosthesis was a level 1 or 2. Her third and last prosthesis was a level 9. She denies any knowledge of drainage.   Her last mammogram was 08/2022. She has had a mammogram every year since her lumpectomy.   She reports having had a couple of falls. She has been using a cane to help her walk.   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   Low penetrance CHEK2 c.470T>C (p.[id number])     Past Surgical History:  Procedure Laterality Date   BREAST LUMPECTOMY Right 1994   w/ radiation and chemotherapy following lumpectomy   BRONCHIAL BIOPSY   11/10/2022   Procedure: BRONCHIAL BIOPSIES;  Surgeon: Juanito Doom, MD;  Location: [place] ENDOSCOPY;  Service: Cardiopulmonary;;   BRONCHIAL BRUSHINGS  11/10/2022   Procedure: BRONCHIAL BRUSHINGS;  Surgeon: Juanito Doom, MD;  Location: [place];  Service: Cardiopulmonary;;   BRONCHIAL WASHINGS  11/10/2022   Procedure: BRONCHIAL WASHINGS;  Surgeon: Juanito Doom, MD;  Location[facility] ENDOSCOPY;  Service: Cardiopulmonary;;   CHOLECYSTECTOMY  06/20/2002   Laparoscopic cholestectomy   varicose veins Bilateral 2013   laser surgery   Dr. Jones Skene   VIDEO BRONCHOSCOPY WITH ENDOBRONCHIAL ULTRASOUND N/A 11/10/2022   Procedure: VIDEO BRONCHOSCOPY WITH ENDOBRONCHIAL ULTRASOUND;  Surgeon: Juanito Doom, MD;  Location: [facility] ENDOSCOPY;  Service: Cardiopulmonary;  Laterality: N/A;    Family History  Problem Relation Age of Onset   Colon cancer Mother 72 - 81   Stroke Mother    Hypertension Mother    Hyperlipidemia Mother    Hepatitis Mother        hep A   COPD Mother    Dementia Mother    Bladder Cancer Mother 15   Hypertension Father    Prostate cancer Father    Diabetes Father    Stroke Father    Pulmonary embolism Father    Deep vein thrombosis Father        PE  Schizophrenia Brother    Mental illness Brother    COPD Brother    Hepatitis C Brother    Liver disease Other     Social History   Socioeconomic History   Marital status: Married    Spouse name: Not on file   Number of children: Not on file   Years of education: Not on file   Highest education level: Not on file  Occupational History   Occupation: retired Product manager: RETIRED  Tobacco Use   Smoking status: Never   Smokeless tobacco: Never  Vaping Use   Vaping Use: Never used  Substance and Sexual Activity   Alcohol use: No   Drug use: No   Sexual activity: Yes    Partners: Male  Other Topics Concern   Not on file  Social History Narrative   Exercise--- walking   Social  Determinants of Health   Financial Resource Strain: Low Risk  (07/20/2021)   Overall Financial Resource Strain (CARDIA)    Difficulty of Paying Living Expenses: Not hard at all  Food Insecurity: No Food Insecurity (07/07/2020)   Hunger Vital Sign    Worried About Running Out of Food in the Last Year: Never true    [place] in the Last Year: Never true  Transportation Needs: No Transportation Needs (07/20/2021)   PRAPARE - Hydrologist (Medical): No    Lack of Transportation (Non-Medical): No  Physical Activity: Sufficiently Active (07/20/2021)   Exercise Vital Sign    Days of Exercise per Week: 7 days    Minutes of Exercise per Session: 30 min  Stress: No Stress Concern Present (07/20/2021)   [place]    Feeling of Stress : Not at all  Social Connections: Moderately Isolated (07/20/2021)   Social Connection and Isolation Panel [NHANES]    Frequency of Communication with Friends and Family: More than three times a week    Frequency of Social Gatherings with Friends and Family: More than three times a week    Attends Religious Services: Never    Marine scientist or Organizations: No    Attends Archivist Meetings: Never    Marital Status: Married  Human resources officer Violence: Not At Risk (07/20/2021)   Humiliation, Afraid, Rape, and Kick questionnaire    Fear of Current or Ex-Partner: No    Emotionally Abused: No    Physically Abused: No    Sexually Abused: No    Outpatient Medications Prior to Visit  Medication Sig Dispense Refill   amLODipine (NORVASC) 2.5 MG tablet TAKE 1 TABLET(2.5 MG) BY MOUTH DAILY 90 tablet 1   aspirin 81 MG tablet Take 81 mg by mouth daily.     bisoprolol-hydrochlorothiazide (ZIAC) 10-6.25 MG tablet TAKE 1 TABLET BY MOUTH DAILY 90 tablet 1   cholecalciferol (VITAMIN D) 1000 units tablet Take 1,000 Units by mouth daily.     fulvestrant  (FASLODEX) 250 MG/5ML injection Inject into the muscle every 30 (thirty) days. One injection each buttock over 1-2 minutes. Warm prior to use.     hydrochlorothiazide (HYDRODIURIL) 25 MG tablet Take 1 tablet (25 mg total) by mouth daily. 90 tablet 1   meclizine (ANTIVERT) 12.5 MG tablet Take 1 tablet (12.5 mg total) by mouth 3 (three) times daily as needed for dizziness. 30 tablet 4   OLANZapine (ZYPREXA) 5 MG tablet TAKE 1 TABLET(5 MG) BY MOUTH AT BEDTIME 30 tablet  3   ondansetron (ZOFRAN) 8 MG tablet TAKE 1 TABLET(8 MG) BY MOUTH EVERY 8 HOURS AS NEEDED FOR NAUSEA OR VOMITING 30 tablet 2   potassium chloride SA (KLOR-CON M) 20 MEQ tablet TAKE 1 TABLET BY MOUTH DAILY 90 tablet 1   ribociclib succ (KISQALI 600MG  DAILY DOSE) 200 MG Therapy Pack Take 3 tablets (600 mg total) by mouth daily. Take for 21 days on, 7 days off, repeat every 28 days. 63 tablet 6   zoledronic acid (ZOMETA) 4 MG/5ML injection Inject 4 mg into the vein every 3 (three) months.     dronabinol (MARINOL) 2.5 MG capsule Take 1 capsule (2.5 mg total) by mouth 2 (two) times daily before lunch and supper. (Patient not taking: Reported on 02/24/2023) 60 capsule 0   HYDROcodone-acetaminophen (NORCO/VICODIN) 5-325 MG tablet Take 1-2 tablets by mouth every 6 (six) hours as needed for moderate pain. (Patient not taking: Reported on 02/24/2023) 60 tablet 0   prochlorperazine (COMPAZINE) 10 MG tablet TAKE 1 TABLET(10 MG) BY MOUTH EVERY 6 HOURS AS NEEDED FOR NAUSEA OR VOMITING (Patient not taking: Reported on 02/24/2023) 90 tablet 0   Coenzyme Q10 200 MG capsule Take 200 mg by mouth daily. (Patient not taking: Reported on 12/02/2022)     No facility-administered medications prior to visit.    No Known Allergies  Review of Systems  Constitutional:  Negative for fever and malaise/fatigue.  HENT:  Negative for congestion.   Eyes:  Negative for blurred vision.  Respiratory:  Negative for shortness of breath.   Cardiovascular:  Negative for chest  pain, palpitations and leg swelling.  Gastrointestinal:  Negative for abdominal pain, blood in stool and nausea.  Genitourinary:  Negative for dysuria and frequency.  Musculoskeletal:  Negative for falls.  Skin:  Negative for rash.       (+) Scar tissue protruding on right breast  Neurological:  Negative for dizziness, loss of consciousness and headaches.  Endo/Heme/Allergies:  Negative for environmental allergies.  Psychiatric/Behavioral:  Negative for depression. The patient is not nervous/anxious.        Objective:    Physical Exam Vitals and nursing note reviewed.  Constitutional:      Appearance: She is well-developed.  HENT:     Head: Normocephalic and atraumatic.  Eyes:     Conjunctiva/sclera: Conjunctivae normal.  Neck:     Thyroid: No thyromegaly.     Vascular: No carotid bruit or JVD.  Cardiovascular:     Rate and Rhythm: Normal rate and regular rhythm.     Heart sounds: Normal heart sounds. No murmur heard. Pulmonary:     Effort: Pulmonary effort is normal. No respiratory distress.     Breath sounds: Normal breath sounds. No wheezing or rales.  Chest:     Chest wall: No tenderness.  Breasts:    Right: Tenderness present.       Comments: Right breast very tender to touch. Hard ulceration at 3 o'clock. No drainage. Positive erythema.  Musculoskeletal:     Cervical back: Normal range of motion and neck supple.  Neurological:     Mental Status: She is alert and oriented to person, place, and time.     BP 114/78 (BP Location: Left Arm, Patient Position: Sitting, Cuff Size: Normal)   Pulse 75   Temp 97.8 F (36.6 C) (Oral)   Resp 18   Ht 5\' 6"  (1.676 m)   Wt 172 lb 12.8 oz (78.4 kg)   SpO2 95%   BMI 27.89 kg/m  Wt Readings from Last 3 Encounters:  03/11/23 172 lb 12.8 oz (78.4 kg)  02/24/23 176 lb 12.8 oz (80.2 kg)  01/27/23 177 lb 1.3 oz (80.3 kg)       Assessment & Plan:  Essential hypertension -     CBC with Differential/Platelet -      Comprehensive metabolic panel -     Lipid panel  Breast pain -     Ambulatory referral to General Surgery -     US BREAST COMPLETE UNI RIGHT INC AXILLA; Future  Hyperlipidemia, unspecified hyperlipidemia type -     CBC with Differential/Platelet -     Comprehensive metabolic panel -     Lipid panel  Abscess of right breast -     Doxycycline Hyclate; Take 1 tablet (100 mg total) by mouth 2 (two) times daily.  Dispense: 20 tablet; Refill: 0     I,Rachel Rivera,acting as a scribe for [name], DO.,have documented all relevant documentation on the behalf of [name], DO,as directed by  [name], DO while in the presence of [name], DO.   I, [name], DO, personally preformed the services described in this documentation.  All medical record entries made by the scribe were at my direction and in my presence.  I have reviewed the chart and discharge instructions (if applicable) and agree that the record reflects my personal performance and is accurate and complete. 03/11/23   [name], DO

## 2023-03-11 NOTE — Patient Instructions (Signed)

## 2023-03-13 ENCOUNTER — Other Ambulatory Visit: Payer: Self-pay | Admitting: Family Medicine

## 2023-03-14 ENCOUNTER — Other Ambulatory Visit: Payer: Self-pay

## 2023-03-14 DIAGNOSIS — N644 Mastodynia: Secondary | ICD-10-CM

## 2023-03-15 ENCOUNTER — Inpatient Hospital Stay: Payer: Medicare PPO | Attending: Hematology & Oncology

## 2023-03-15 DIAGNOSIS — Z170 Estrogen receptor positive status [ER+]: Secondary | ICD-10-CM | POA: Insufficient documentation

## 2023-03-15 DIAGNOSIS — R921 Mammographic calcification found on diagnostic imaging of breast: Secondary | ICD-10-CM | POA: Diagnosis not present

## 2023-03-15 DIAGNOSIS — Z5111 Encounter for antineoplastic chemotherapy: Secondary | ICD-10-CM | POA: Insufficient documentation

## 2023-03-15 DIAGNOSIS — C7951 Secondary malignant neoplasm of bone: Secondary | ICD-10-CM | POA: Diagnosis not present

## 2023-03-15 DIAGNOSIS — Z853 Personal history of malignant neoplasm of breast: Secondary | ICD-10-CM | POA: Diagnosis not present

## 2023-03-15 DIAGNOSIS — C771 Secondary and unspecified malignant neoplasm of intrathoracic lymph nodes: Secondary | ICD-10-CM | POA: Diagnosis not present

## 2023-03-15 DIAGNOSIS — N6489 Other specified disorders of breast: Secondary | ICD-10-CM | POA: Diagnosis not present

## 2023-03-15 DIAGNOSIS — C50911 Malignant neoplasm of unspecified site of right female breast: Secondary | ICD-10-CM | POA: Diagnosis not present

## 2023-03-15 DIAGNOSIS — Z7982 Long term (current) use of aspirin: Secondary | ICD-10-CM | POA: Diagnosis not present

## 2023-03-15 DIAGNOSIS — Z923 Personal history of irradiation: Secondary | ICD-10-CM | POA: Insufficient documentation

## 2023-03-15 DIAGNOSIS — Z79899 Other long term (current) drug therapy: Secondary | ICD-10-CM | POA: Insufficient documentation

## 2023-03-15 DIAGNOSIS — C50912 Malignant neoplasm of unspecified site of left female breast: Secondary | ICD-10-CM

## 2023-03-15 LAB — CBC WITH DIFFERENTIAL (CANCER CENTER ONLY)
Abs Immature Granulocytes: 0.01 10*3/uL (ref 0.00–0.07)
Basophils Absolute: 0 10*3/uL (ref 0.0–0.1)
Basophils Relative: 2 %
Eosinophils Absolute: 0 10*3/uL (ref 0.0–0.5)
Eosinophils Relative: 1 %
HCT: 29.4 % — ABNORMAL LOW (ref 36.0–46.0)
Hemoglobin: 10.6 g/dL — ABNORMAL LOW (ref 12.0–15.0)
Immature Granulocytes: 1 %
Lymphocytes Relative: 25 %
Lymphs Abs: 0.4 10*3/uL — ABNORMAL LOW (ref 0.7–4.0)
MCH: 39 pg — ABNORMAL HIGH (ref 26.0–34.0)
MCHC: 36.1 g/dL — ABNORMAL HIGH (ref 30.0–36.0)
MCV: 108.1 fL — ABNORMAL HIGH (ref 80.0–100.0)
Monocytes Absolute: 0.2 10*3/uL (ref 0.1–1.0)
Monocytes Relative: 16 %
Neutro Abs: 0.8 10*3/uL — ABNORMAL LOW (ref 1.7–7.7)
Neutrophils Relative %: 55 %
Platelet Count: 205 10*3/uL (ref 150–400)
RBC: 2.72 MIL/uL — ABNORMAL LOW (ref 3.87–5.11)
RDW: 16 % — ABNORMAL HIGH (ref 11.5–15.5)
WBC Count: 1.5 10*3/uL — ABNORMAL LOW (ref 4.0–10.5)
nRBC: 0 % (ref 0.0–0.2)

## 2023-03-15 LAB — CMP (CANCER CENTER ONLY)
ALT: 8 U/L (ref 0–44)
AST: 16 U/L (ref 15–41)
Albumin: 3.9 g/dL (ref 3.5–5.0)
Alkaline Phosphatase: 89 U/L (ref 38–126)
Anion gap: 10 (ref 5–15)
BUN: 22 mg/dL (ref 8–23)
CO2: 27 mmol/L (ref 22–32)
Calcium: 9.5 mg/dL (ref 8.9–10.3)
Chloride: 101 mmol/L (ref 98–111)
Creatinine: 1.24 mg/dL — ABNORMAL HIGH (ref 0.44–1.00)
GFR, Estimated: 47 mL/min — ABNORMAL LOW (ref >60)
Glucose, Bld: 107 mg/dL — ABNORMAL HIGH (ref 70–99)
Potassium: 3.5 mmol/L (ref 3.5–5.1)
Sodium: 138 mmol/L (ref 135–145)
Total Bilirubin: 0.6 mg/dL (ref 0.3–1.2)
Total Protein: 6.7 g/dL (ref 6.5–8.1)

## 2023-03-15 LAB — LACTATE DEHYDROGENASE: LDH: 242 U/L — ABNORMAL HIGH (ref 98–192)

## 2023-03-16 ENCOUNTER — Encounter: Payer: Self-pay | Admitting: *Deleted

## 2023-03-16 ENCOUNTER — Other Ambulatory Visit: Payer: Self-pay | Admitting: *Deleted

## 2023-03-16 DIAGNOSIS — I10 Essential (primary) hypertension: Secondary | ICD-10-CM

## 2023-03-16 LAB — CANCER ANTIGEN 27.29: CA 27.29: 65.5 U/mL — ABNORMAL HIGH (ref 0.0–38.6)

## 2023-03-16 MED ORDER — POTASSIUM CHLORIDE CRYS ER 20 MEQ PO TBCR: 20.0000 meq | EXTENDED_RELEASE_TABLET | Freq: Two times a day (BID) | ORAL | 2 refills | Status: DC

## 2023-03-22 ENCOUNTER — Other Ambulatory Visit: Payer: Self-pay | Admitting: *Deleted

## 2023-03-22 DIAGNOSIS — C50912 Malignant neoplasm of unspecified site of left female breast: Secondary | ICD-10-CM

## 2023-03-23 ENCOUNTER — Encounter: Payer: Self-pay | Admitting: *Deleted

## 2023-03-23 ENCOUNTER — Inpatient Hospital Stay: Payer: Medicare PPO

## 2023-03-23 ENCOUNTER — Inpatient Hospital Stay: Payer: Medicare PPO | Admitting: Hematology & Oncology

## 2023-03-23 VITALS — BP 95/73 | HR 77 | Temp 98.1°F | Resp 16 | Ht 66.0 in | Wt 172.0 lb

## 2023-03-23 DIAGNOSIS — C50912 Malignant neoplasm of unspecified site of left female breast: Secondary | ICD-10-CM

## 2023-03-23 DIAGNOSIS — Z923 Personal history of irradiation: Secondary | ICD-10-CM | POA: Diagnosis not present

## 2023-03-23 DIAGNOSIS — Z170 Estrogen receptor positive status [ER+]: Secondary | ICD-10-CM | POA: Diagnosis not present

## 2023-03-23 DIAGNOSIS — C50911 Malignant neoplasm of unspecified site of right female breast: Secondary | ICD-10-CM | POA: Diagnosis not present

## 2023-03-23 DIAGNOSIS — Z7982 Long term (current) use of aspirin: Secondary | ICD-10-CM | POA: Diagnosis not present

## 2023-03-23 DIAGNOSIS — C50919 Malignant neoplasm of unspecified site of unspecified female breast: Secondary | ICD-10-CM

## 2023-03-23 DIAGNOSIS — C7951 Secondary malignant neoplasm of bone: Secondary | ICD-10-CM | POA: Diagnosis not present

## 2023-03-23 DIAGNOSIS — C771 Secondary and unspecified malignant neoplasm of intrathoracic lymph nodes: Secondary | ICD-10-CM | POA: Diagnosis not present

## 2023-03-23 DIAGNOSIS — Z5111 Encounter for antineoplastic chemotherapy: Secondary | ICD-10-CM | POA: Diagnosis not present

## 2023-03-23 DIAGNOSIS — Z79899 Other long term (current) drug therapy: Secondary | ICD-10-CM | POA: Diagnosis not present

## 2023-03-23 LAB — CBC WITH DIFFERENTIAL (CANCER CENTER ONLY)
Abs Immature Granulocytes: 0.01 10*3/uL (ref 0.00–0.07)
Basophils Absolute: 0 10*3/uL (ref 0.0–0.1)
Basophils Relative: 1 %
Eosinophils Absolute: 0 10*3/uL (ref 0.0–0.5)
Eosinophils Relative: 1 %
HCT: 34.5 % — ABNORMAL LOW (ref 36.0–46.0)
Hemoglobin: 12.4 g/dL (ref 12.0–15.0)
Immature Granulocytes: 0 %
Lymphocytes Relative: 16 %
Lymphs Abs: 0.5 10*3/uL — ABNORMAL LOW (ref 0.7–4.0)
MCH: 39.2 pg — ABNORMAL HIGH (ref 26.0–34.0)
MCHC: 35.9 g/dL (ref 30.0–36.0)
MCV: 109.2 fL — ABNORMAL HIGH (ref 80.0–100.0)
Monocytes Absolute: 0.2 10*3/uL (ref 0.1–1.0)
Monocytes Relative: 7 %
Neutro Abs: 2.3 10*3/uL (ref 1.7–7.7)
Neutrophils Relative %: 75 %
Platelet Count: 272 10*3/uL (ref 150–400)
RBC: 3.16 MIL/uL — ABNORMAL LOW (ref 3.87–5.11)
RDW: 14.9 % (ref 11.5–15.5)
WBC Count: 3.1 10*3/uL — ABNORMAL LOW (ref 4.0–10.5)
nRBC: 0 % (ref 0.0–0.2)

## 2023-03-23 LAB — COMPREHENSIVE METABOLIC PANEL
ALT: 10 U/L (ref 0–44)
AST: 18 U/L (ref 15–41)
Albumin: 3.9 g/dL (ref 3.5–5.0)
Alkaline Phosphatase: 118 U/L (ref 38–126)
Anion gap: 10 (ref 5–15)
BUN: 27 mg/dL — ABNORMAL HIGH (ref 8–23)
CO2: 27 mmol/L (ref 22–32)
Calcium: 9 mg/dL (ref 8.9–10.3)
Chloride: 100 mmol/L (ref 98–111)
Creatinine, Ser: 1.27 mg/dL — ABNORMAL HIGH (ref 0.44–1.00)
GFR, Estimated: 45 mL/min — ABNORMAL LOW (ref >60)
Glucose, Bld: 111 mg/dL — ABNORMAL HIGH (ref 70–99)
Potassium: 3.7 mmol/L (ref 3.5–5.1)
Sodium: 137 mmol/L (ref 135–145)
Total Bilirubin: 0.7 mg/dL (ref 0.3–1.2)
Total Protein: 6.5 g/dL (ref 6.5–8.1)

## 2023-03-23 MED ORDER — FULVESTRANT 250 MG/5ML IM SOSY
500.0000 mg | PREFILLED_SYRINGE | Freq: Once | INTRAMUSCULAR | Status: AC
  Administered 2023-03-23: 500 mg via INTRAMUSCULAR
  Filled 2023-03-23: qty 10

## 2023-03-23 NOTE — Progress Notes (Signed)
Hematology and Oncology Follow Up Visit  Tracy Thompson [id number] [birth year] 72 y.o. 03/23/2023   Principle Diagnosis:  Metastatic breast cancer-bone metastasis/hilar lymph node metastasis- ER+/PR+/HER-2 (2+) -- PIK3CA (+)  Current Therapy:   Faslodex 500 mg IM monthly -start on 11/09/2022 Ribociclib 600 mg p.o. daily (21/7) --start on 11/09/2022 Zometa 4 mg IV every 3 months -next dose on 04/2023 Radiation therapy to left hip -completed in 11/23/2022     Interim History:  Tracy Thompson is back for follow-up.  Unfortunately, we do have a problem now.  She developed this firm nodule in the right breast.  This is the breast that she had surgery on 30 years ago.  She had a lumpectomy.  This is at the lumpectomy site.  This is probably about the 3 o'clock position.  She saw her family doctor.  She felt that this may be an infection.  She was placed on some antibiotics.  Have a mammogram was subsequently done.  The mammogram showed that there was a nodule that measured 2 x 3 x 1 cm.  This was firm and highly calcified.  It is felt that this was a malignant recurrence.  The radiologist did not think that this could be biopsied.  I am incredibly distressed over this.  I have to believe that there must be something different about this tumor that allow this to grow despite her being on antiestrogen therapy.  I suspect that she is probably going need some kind of surgery in order to remove this.  I would think a mastectomy if that could be done.  I know she is going to see Dr. Donne Hazel of General Surgery next week.  I will have to talk to them before this.  She otherwise is doing okay.  Her CA 27.29 has been coming down.  The last CA 27.29 was down to 65.  She feels okay.  She has had no cough or shortness of breath.  She has had no nausea or vomiting.  She has had no rashes.  There is been no leg swelling.  Is been no change in bowel or bladder habits.  She has had no headache.  I suspect that we are  probably going to need a no other  PET scan on her.  The last PET scan that she had done back in January showed that she had been responding.  Currently, I would say performance status is probably ECOG 1.     Medications:  Current Outpatient Medications:    amLODipine (NORVASC) 2.5 MG tablet, TAKE 1 TABLET(2.5 MG) BY MOUTH DAILY, Disp: 90 tablet, Rfl: 1   aspirin 81 MG tablet, Take 81 mg by mouth daily., Disp: , Rfl:    bisoprolol-hydrochlorothiazide (ZIAC) 10-6.25 MG tablet, TAKE 1 TABLET BY MOUTH DAILY, Disp: 90 tablet, Rfl: 1   cholecalciferol (VITAMIN D) 1000 units tablet, Take 1,000 Units by mouth daily., Disp: , Rfl:    fulvestrant (FASLODEX) 250 MG/5ML injection, Inject into the muscle every 30 (thirty) days. One injection each buttock over 1-2 minutes. Warm prior to use., Disp: , Rfl:    hydrochlorothiazide (HYDRODIURIL) 25 MG tablet, Take 1 tablet (25 mg total) by mouth daily., Disp: 90 tablet, Rfl: 1   meclizine (ANTIVERT) 12.5 MG tablet, Take 1 tablet (12.5 mg total) by mouth 3 (three) times daily as needed for dizziness., Disp: 30 tablet, Rfl: 4   OLANZapine (ZYPREXA) 5 MG tablet, TAKE 1 TABLET(5 MG) BY MOUTH AT BEDTIME, Disp: 30 tablet, Rfl: 3  ondansetron (ZOFRAN) 8 MG tablet, TAKE 1 TABLET(8 MG) BY MOUTH EVERY 8 HOURS AS NEEDED FOR NAUSEA OR VOMITING, Disp: 30 tablet, Rfl: 2   potassium chloride SA (KLOR-CON M) 20 MEQ tablet, Take 1 tablet (20 mEq total) by mouth 2 (two) times daily., Disp: 60 tablet, Rfl: 2   ribociclib succ (KISQALI 600MG  DAILY DOSE) 200 MG Therapy Pack, Take 3 tablets (600 mg total) by mouth daily. Take for 21 days on, 7 days off, repeat every 28 days., Disp: 63 tablet, Rfl: 6   zoledronic acid (ZOMETA) 4 MG/5ML injection, Inject 4 mg into the vein every 3 (three) months., Disp: , Rfl:   Allergies: No Known Allergies  Past Medical History, Surgical history, Social history, and Family History were reviewed and updated.  Review of Systems: Review of Systems   Constitutional:  Positive for fatigue.  HENT:  Negative.    Eyes: Negative.   Respiratory: Negative.    Cardiovascular: Negative.   Gastrointestinal:  Positive for constipation and nausea.  Endocrine: Negative.   Genitourinary: Negative.    Musculoskeletal:  Positive for arthralgias and myalgias.  Skin: Negative.   Neurological: Negative.   Hematological: Negative.   Psychiatric/Behavioral: Negative.      Physical Exam:  height is 5\' 6"  (1.676 m) and weight is 172 lb (78 kg). Her oral temperature is 98.1 F (36.7 C). Her blood pressure is 95/73 and her pulse is 77. Her respiration is 16 and oxygen saturation is 98%.   Wt Readings from Last 3 Encounters:  03/23/23 172 lb (78 kg)  03/11/23 172 lb 12.8 oz (78.4 kg)  02/24/23 176 lb 12.8 oz (80.2 kg)    Physical Exam Vitals reviewed.  Constitutional:      Comments: In the right breast, at about the 2-3 o'clock position, there is a firm mass.  There is an area of erythema associate with this mass.  This is right where she had a lumpectomy.  This probably measures 3 x 3 cm.  It is firm.  It is slightly tender to palpation.  There is no right axillary adenopathy.  There is no right nipple discharge.  Left breast is unremarkable.  HENT:     Head: Normocephalic and atraumatic.  Eyes:     Pupils: Pupils are equal, round, and reactive to light.  Cardiovascular:     Rate and Rhythm: Normal rate and regular rhythm.     Heart sounds: Normal heart sounds.  Pulmonary:     Effort: Pulmonary effort is normal.     Breath sounds: Normal breath sounds.  Abdominal:     General: Bowel sounds are normal.     Palpations: Abdomen is soft.  Musculoskeletal:        General: No tenderness or deformity. Normal range of motion.     Cervical back: Normal range of motion.  Lymphadenopathy:     Cervical: No cervical adenopathy.  Skin:    General: Skin is warm and dry.     Findings: No erythema or rash.  Neurological:     Mental Status: She is  alert and oriented to person, place, and time.  Psychiatric:        Behavior: Behavior normal.        Thought Content: Thought content normal.        Judgment: Judgment normal.      Lab Results  Component Value Date   WBC 3.1 (L) 03/23/2023   HGB 12.4 03/23/2023   HCT 34.5 (L) 03/23/2023   MCV  109.2 (H) 03/23/2023   PLT 272 03/23/2023     Chemistry      Component Value Date/Time   NA 137 03/23/2023 1225   K 3.7 03/23/2023 1225   CL 100 03/23/2023 1225   CO2 27 03/23/2023 1225   BUN 27 (H) 03/23/2023 1225   CREATININE 1.27 (H) 03/23/2023 1225   CREATININE 1.24 (H) 03/15/2023 1302      Component Value Date/Time   CALCIUM 9.0 03/23/2023 1225   ALKPHOS 118 03/23/2023 1225   AST 18 03/23/2023 1225   AST 16 03/15/2023 1302   ALT 10 03/23/2023 1225   ALT 8 03/15/2023 1302   BILITOT 0.7 03/23/2023 1225   BILITOT 0.6 03/15/2023 1302       Impression and Plan: Tracy Thompson is a very nice 72 year old white female.  She has metastatic breast cancer.  We have her on antiestrogen therapy.  Her CA 27-29 was coming down.  However, we now have this mass in the right breast.  This is troublesome to me.  Again I would think that given that she has responded to the antiestrogen therapy that she would not develop a mass.  As such, I wonder if this is some kind of possible new breast cancer that is estrogen deficient.  Again I suspect that she may need a mastectomy if that can be done.  Will get have to get tissue to know what we are dealing with.  She got her Faslodex today.  She is on ribociclib and doing okay with this.  We could certainly adjust her ribociclib schedule depending on any upcoming surgery.  This is totally unexpected.  This is incredibly complex.  I spent about 40 minutes or so with her today.  We will order the PET scan.  Will still see her back in a month, or sooner if I need to.   Volanda Napoleon, MD 3/27/20241:58 PM

## 2023-03-23 NOTE — Progress Notes (Signed)
Patient needs a PET prior to her next provider visit. Scheduled for 04/14/2023.  Patient is aware of PET appointment including date, time, and location. The following prep is reviewed with patient and confirmed with teachback: - arrive 30 minutes before appointment time - NPO except water for 6h before scan. No candy, no gum - hold any diabetic medication the morning of the scan - have a low carb dinner the night prior Radiology Information sheet also mailed to patient's home for reinforcement of education.   Patient is scheduled with [facility] on 03/31/23 to discuss biopsy/surgery to address new breast mass.  Oncology Nurse Navigator Documentation     03/23/2023    1:30 PM  Oncology Nurse Navigator Flowsheets  Navigator Follow Up Date: 03/31/2023  Navigator Follow Up Reason: Review Note  Navigator Location [facility]  Navigator Encounter Type Appt/Treatment Plan Review;MyChart  Patient Visit Type MedOnc  Treatment Phase Active Tx  Barriers/Navigation Needs Coordination of Care  Education Other  Interventions Coordination of Care  Acuity Level 1-No Barriers  Coordination of Care Radiology  Education Method Written  Support Groups/Services Friends and Family  Time Spent with Patient 30

## 2023-03-23 NOTE — Patient Instructions (Signed)

## 2023-03-29 ENCOUNTER — Other Ambulatory Visit: Payer: Self-pay

## 2023-03-31 ENCOUNTER — Other Ambulatory Visit: Payer: Self-pay | Admitting: General Surgery

## 2023-03-31 DIAGNOSIS — C50911 Malignant neoplasm of unspecified site of right female breast: Secondary | ICD-10-CM | POA: Diagnosis not present

## 2023-03-31 DIAGNOSIS — C7989 Secondary malignant neoplasm of other specified sites: Secondary | ICD-10-CM | POA: Diagnosis not present

## 2023-04-01 ENCOUNTER — Encounter: Payer: Self-pay | Admitting: *Deleted

## 2023-04-01 NOTE — Progress Notes (Signed)
Patient was seen by [facility] and is scheduled for mastectomy on 04/12/2023. Her PET was originally scheduled for 04/14/2023 and will need to be rescheduled.   PET rescheduled for 04/25/2023. Information sent to patient via MyChart.  Oncology Nurse Navigator Documentation     04/01/2023   11:00 AM  Oncology Nurse Navigator Flowsheets  Navigator Follow Up Date: 04/12/2023  Navigator Follow Up Reason: Surgery  Navigator Location [facility]  Navigator Encounter Type Appt/Treatment Plan Review;MyChart  Patient Visit Type MedOnc  Treatment Phase Active Tx  Barriers/Navigation Needs Coordination of Care;Education  Education Other  Interventions Coordination of Care;Education  Acuity Level 2-Minimal Needs (1-2 Barriers Identified)  Coordination of Care Radiology  Education Method Written  Support Groups/Services Friends and Family  Time Spent with Patient 30

## 2023-04-05 ENCOUNTER — Other Ambulatory Visit (HOSPITAL_COMMUNITY): Payer: Self-pay

## 2023-04-06 NOTE — Pre-Procedure Instructions (Signed)
Surgical Instructions    Your procedure is scheduled on Tuesday, April 16th.  [facility] at 12:30 P.M., then check in with the Admitting office.  Call this number if you have problems the morning of surgery:  [phone]  If you have any questions prior to your surgery date call [phone]: Open Monday-Friday 8am-4pm If you experience any cold or flu symptoms such as cough, fever, chills, shortness of breath, etc. between now and your scheduled surgery, please notify us at the above number.     Remember:  Do not eat after midnight the night before your surgery  You may drink clear liquids until 11:30 AM the morning of your surgery.   Clear liquids allowed are: Water, Non-Citrus Juices (without pulp), Carbonated Beverages, Clear Tea, Black Coffee Only (NO MILK, CREAM OR POWDERED CREAMER of any kind), and Gatorade.  Patient Instructions  The night before surgery:  No food after midnight. ONLY clear liquids after midnight  The day of surgery (if you do NOT have diabetes):  Drink ONE (1) Pre-Surgery Clear Ensure by 11:30 AM the morning of surgery. Drink in one sitting. Do not sip.  This drink was given to you during your hospital  pre-op appointment visit.  Nothing else to drink after completing the  Pre-Surgery Clear Ensure.          If you have questions, please contact your surgeon's office.     Take these medicines the morning of surgery with A SIP OF WATER  amLODipine (NORVASC)     If needed: meclizine (ANTIVERT)  ondansetron (ZOFRAN)    Follow your surgeon's instructions on when to stop Aspirin.  If no instructions were given by your surgeon then you will need to call the office to get those instructions.     As of today, STOP taking any Aleve, Naproxen, Ibuprofen, Motrin, Advil, Goody's, BC's, all herbal medications, fish oil, and all vitamins.                     Do NOT Smoke (Tobacco/Vaping) for 24 hours prior to your  procedure.  If you use a CPAP at night, you may bring your mask/headgear for your overnight stay.   Contacts, glasses, piercing's, hearing aid's, dentures or partials may not be worn into surgery, please bring cases for these belongings.    For patients admitted to the hospital, discharge time will be determined by your treatment team.   Patients discharged the day of surgery will not be allowed to drive home, and someone needs to stay with them for 24 hours.  SURGICAL WAITING ROOM VISITATION Patients having surgery or a procedure may have no more than 2 support people in the waiting area - these visitors may rotate.   Children under the age of 23 must have an adult with them who is not the patient. If the patient needs to stay at the hospital during part of their recovery, the visitor guidelines for inpatient rooms apply. Pre-op nurse will coordinate an appropriate time for 1 support person to accompany patient in pre-op.  This support person may not rotate.   Please refer to the [facility] website for the visitor guidelines for Inpatients (after your surgery is over and you are in a regular room).    Special instructions:   [facility]- Preparing For Surgery  Before surgery, you can play an important role. Because skin is not sterile, your skin needs to be as free of germs as possible.  You can reduce the number of germs on your skin by washing with CHG (chlorahexidine gluconate) Soap before surgery.  CHG is an antiseptic cleaner which kills germs and bonds with the skin to continue killing germs even after washing.    Oral Hygiene is also important to reduce your risk of infection.  Remember - BRUSH YOUR TEETH THE MORNING OF SURGERY WITH YOUR REGULAR TOOTHPASTE  Please do not use if you have an allergy to CHG or antibacterial soaps. If your skin becomes reddened/irritated stop using the CHG.  Do not shave (including legs and underarms) for at least 48 hours prior to first CHG shower. It  is OK to shave your face.  Please follow these instructions carefully.   Shower the NIGHT BEFORE SURGERY and the MORNING OF SURGERY  If you chose to wash your hair, wash your hair first as usual with your normal shampoo.  After you shampoo, rinse your hair and body thoroughly to remove the shampoo.  Use CHG Soap as you would any other liquid soap. You can apply CHG directly to the skin and wash gently with a scrungie or a clean washcloth.   Apply the CHG Soap to your body ONLY FROM THE NECK DOWN.  Do not use on open wounds or open sores. Avoid contact with your eyes, ears, mouth and genitals (private parts). Wash Face and genitals (private parts)  with your normal soap.   Wash thoroughly, paying special attention to the area where your surgery will be performed.  Thoroughly rinse your body with warm water from the neck down.  DO NOT shower/wash with your normal soap after using and rinsing off the CHG Soap.  Pat yourself dry with a CLEAN TOWEL.  Wear CLEAN PAJAMAS to bed the night before surgery  Place CLEAN SHEETS on your bed the night before your surgery  DO NOT SLEEP WITH PETS.   Day of Surgery: Take a shower with CHG soap. Do not wear jewelry or makeup Do not wear lotions, powders, perfume, or deodorant. Do not shave 48 hours prior to surgery.   Do not bring valuables to the hospital. [facility] is not responsible for any belongings or valuables. Do not wear nail polish, gel polish, artificial nails, or any other type of covering on natural nails (fingers and toes) If you have artificial nails or gel coating that need to be removed by a nail salon, please have this removed prior to surgery. Artificial nails or gel coating may interfere with anesthesia's ability to adequately monitor your vital signs. Wear Clean/Comfortable clothing the morning of surgery Remember to brush your teeth WITH YOUR REGULAR TOOTHPASTE.   Please read over the following fact sheets that you were  given.    If you received a COVID test during your pre-op visit  it is requested that you wear a mask when out in public, stay away from anyone that may not be feeling well and notify your surgeon if you develop symptoms. If you have been in contact with anyone that has tested positive in the last 10 days please notify you surgeon.

## 2023-04-07 ENCOUNTER — Encounter (HOSPITAL_COMMUNITY)
Admission: RE | Admit: 2023-04-07 | Discharge: 2023-04-07 | Disposition: A | Discharge status: Home / Self Care | Payer: Medicare PPO | Source: Ambulatory Visit | Attending: General Surgery | Admitting: General Surgery

## 2023-04-07 ENCOUNTER — Other Ambulatory Visit: Payer: Self-pay

## 2023-04-07 ENCOUNTER — Encounter (HOSPITAL_COMMUNITY): Payer: Self-pay

## 2023-04-07 VITALS — BP 123/57 | HR 80 | Temp 97.8°F | Resp 18 | Ht 66.0 in | Wt 170.3 lb

## 2023-04-07 DIAGNOSIS — C7951 Secondary malignant neoplasm of bone: Secondary | ICD-10-CM | POA: Insufficient documentation

## 2023-04-07 DIAGNOSIS — Z01812 Encounter for preprocedural laboratory examination: Secondary | ICD-10-CM | POA: Diagnosis not present

## 2023-04-07 DIAGNOSIS — C50911 Malignant neoplasm of unspecified site of right female breast: Secondary | ICD-10-CM | POA: Insufficient documentation

## 2023-04-07 DIAGNOSIS — I10 Essential (primary) hypertension: Secondary | ICD-10-CM | POA: Insufficient documentation

## 2023-04-07 DIAGNOSIS — Z1501 Genetic susceptibility to malignant neoplasm of breast: Secondary | ICD-10-CM | POA: Diagnosis not present

## 2023-04-07 LAB — CBC
HCT: 34.2 % — ABNORMAL LOW (ref 36.0–46.0)
Hemoglobin: 12.5 g/dL (ref 12.0–15.0)
MCH: 41 pg — ABNORMAL HIGH (ref 26.0–34.0)
MCHC: 36.5 g/dL — ABNORMAL HIGH (ref 30.0–36.0)
MCV: 112.1 fL — ABNORMAL HIGH (ref 80.0–100.0)
Platelets: 192 10*3/uL (ref 150–400)
RBC: 3.05 MIL/uL — ABNORMAL LOW (ref 3.87–5.11)
RDW: 14.6 % (ref 11.5–15.5)
WBC: 2.3 10*3/uL — ABNORMAL LOW (ref 4.0–10.5)
nRBC: 0 % (ref 0.0–0.2)

## 2023-04-07 LAB — BASIC METABOLIC PANEL
Anion gap: 12 (ref 5–15)
BUN: 16 mg/dL (ref 8–23)
CO2: 28 mmol/L (ref 22–32)
Calcium: 9.2 mg/dL (ref 8.9–10.3)
Chloride: 97 mmol/L — ABNORMAL LOW (ref 98–111)
Creatinine, Ser: 1.26 mg/dL — ABNORMAL HIGH (ref 0.44–1.00)
GFR, Estimated: 46 mL/min — ABNORMAL LOW (ref >60)
Glucose, Bld: 110 mg/dL — ABNORMAL HIGH (ref 70–99)
Potassium: 3.2 mmol/L — ABNORMAL LOW (ref 3.5–5.1)
Sodium: 137 mmol/L (ref 135–145)

## 2023-04-07 NOTE — Progress Notes (Addendum)
PCP - Loreen Freud [place] Cardiologist - Denies  PPM/ICD - Denies  Chest x-ray - NI EKG - 12/02/22 Stress Test - Yes probably 10 years ago no f/u necessary ECHO - 06/09/2016 Cardiac Cath - Denies  Sleep Study - Denies  DM - Denies  Aspirin Instructions: Requested patient to call surgeon and PCP for instruction on when to stop.  Has Leiden factor 5  ERAS Protcol - Yes  PRE-SURGERY Ensure    COVID TEST- N/A  NO PNA, RSV, flu or virus in the past two months.   Anesthesia review: yes abnormal labs   Patient denies shortness of breath, fever, cough and chest pain at PAT appointment   All instructions explained to the patient, with a verbal understanding of the material. Patient agrees to go over the instructions while at home for a better understanding. The opportunity to ask questions was provided.

## 2023-04-08 ENCOUNTER — Other Ambulatory Visit: Payer: Self-pay

## 2023-04-08 NOTE — Anesthesia Preprocedure Evaluation (Signed)
Anesthesia Evaluation  Patient identified by MRN, date of birth, ID band Patient awake    Reviewed: Allergy & Precautions, NPO status , Patient's Chart, lab work & pertinent test results  History of Anesthesia Complications Negative for: history of anesthetic complications  Airway Mallampati: III  TM Distance: >3 FB Neck ROM: Full    Dental  (+) Dental Advisory Given   Pulmonary neg pulmonary ROS   Pulmonary exam normal breath sounds clear to auscultation       Cardiovascular hypertension, (-) angina (-) Past MI, (-) Cardiac Stents and (-) CABG (-) dysrhythmias  Rhythm:Regular Rate:Normal     Neuro/Psych  Headaches, neg Seizures    GI/Hepatic Neg liver ROS,GERD  ,,  Endo/Other  negative endocrine ROS    Renal/GU negative Renal ROS     Musculoskeletal   Abdominal   Peds  Hematology negative hematology ROS (+)   Anesthesia Other Findings   Reproductive/Obstetrics                             Anesthesia Physical Anesthesia Plan  ASA: 2  Anesthesia Plan: General and Regional   Post-op Pain Management: Tylenol PO (pre-op)*   Induction: Intravenous  PONV Risk Score and Plan: 3 and Ondansetron, Dexamethasone and Treatment may vary due to age or medical condition  Airway Management Planned: LMA  Additional Equipment:   Intra-op Plan:   Post-operative Plan: Extubation in OR  Informed Consent:      Dental advisory given  Plan Discussed with: CRNA and Anesthesiologist  Anesthesia Plan Comments: (See APP note by Joslyn Hy, FNP   Risks of general anesthesia discussed including, but not limited to, sore throat, hoarse voice, chipped/damaged teeth, injury to vocal cords, nausea and vomiting, allergic reactions, lung infection, heart attack, stroke, and death. All questions answered. )       Anesthesia Quick Evaluation

## 2023-04-08 NOTE — Progress Notes (Signed)
Anesthesia Chart Review:   Case: [id number] Date/Time: 04/12/23 1415   Procedure: RIGHT TOTAL MASTECTOMY (Right: Breast)   Anesthesia type: General   Pre-op diagnosis: RIGHT BREAST CANCER   [facility] RO[facility] / [facility]   Surgeons: Emelia Loron, MD       DISCUSSION: Pt is 72 years old with hx HTN, Factor 5 Leiden, metastatic breast cancer (mets to bone)  VS: BP (!) 123/57   Pulse 80   Temp 36.6 C   Resp 18   Ht 5\' 6"  (1.676 m)   Wt 77.2 kg   SpO2 100%   BMI 27.49 kg/m   PROVIDERS: - PCP is Donato Schultz, DO - Oncologist is Arlan Organ, MD.   LABS: Labs reviewed: Acceptable for surgery. - WBC 2.3; this is consistent with prior results  (all labs ordered are listed, but only abnormal results are displayed)  Labs Reviewed  BASIC METABOLIC PANEL - Abnormal; Notable for the following components:      Result Value   Potassium 3.2 (*)    Chloride 97 (*)    Glucose, Bld 110 (*)    Creatinine, Ser 1.26 (*)    GFR, Estimated 46 (*)    All other components within normal limits  CBC - Abnormal; Notable for the following components:   WBC 2.3 (*)    RBC 3.05 (*)    HCT 34.2 (*)    MCV 112.1 (*)    MCH 41.0 (*)    MCHC 36.5 (*)    All other components within normal limits    EKG 12/02/22: NSR. Nonspecific T wave abnormality    CV: Echo 06/09/16:  - Left ventricle: The cavity size was normal. Wall thickness was normal. Systolic function was normal. The estimated ejection fraction was in the range of 60% to 65%. Features are consistent with a pseudonormal left ventricular filling pattern, with concomitant abnormal relaxation and increased filling pressure (grade 2 diastolic dysfunction).  - Aortic valve: Transvalvular velocity was within the normal range. There was no stenosis. There was no regurgitation.  - Mitral valve: There was trivial regurgitation.  - Right ventricle: The cavity size was normal. Wall thickness was normal. Systolic function was normal.   - Atrial septum: No defect or patent foramen ovale was identified.  - Tricuspid valve: There was no regurgitation.  - Inferior vena cava: The vessel was normal in size. The respirophasic diameter changes were in the normal range (>= 50%), consistent with normal central venous pressure.   Past Medical History:  Diagnosis Date   Breast cancer 1994   s/p chemo 8/94-1/95, radiation 8/94-11/94   Breast cancer metastasized to bone 09/29/2022   Cataracts, bilateral    Dyspnea    Factor 5 Leiden mutation, heterozygous    also has Factor 8 problems   Headache    younger 87   History of radiation therapy    Lumbar Spine, Left hip- 11/03/22-11/17/22- Dr. 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)     Past Surgical History:  Procedure Laterality Date   BREAST LUMPECTOMY Right 1994   w/ radiation and chemotherapy following lumpectomy   BRONCHIAL BIOPSY  11/10/2022   Procedure: BRONCHIAL BIOPSIES;  Surgeon: Lupita Leash, MD;  Location: [facility] ENDOSCOPY;  Service: Cardiopulmonary;;   BRONCHIAL BRUSHINGS  11/10/2022  Procedure: BRONCHIAL BRUSHINGS;  Surgeon: Lupita Leash, MD;  Location: [facility] ENDOSCOPY;  Service: Cardiopulmonary;;   BRONCHIAL WASHINGS  11/10/2022   Procedure: BRONCHIAL WASHINGS;  Surgeon: Lupita Leash, MD;  Location: [facility] ENDOSCOPY;  Service: Cardiopulmonary;;   CHOLECYSTECTOMY  06/20/2002   Laparoscopic cholestectomy   varicose veins Bilateral 2013   laser surgery   Dr. Jimmie Molly   VIDEO BRONCHOSCOPY WITH ENDOBRONCHIAL ULTRASOUND N/A 11/10/2022   Procedure: VIDEO BRONCHOSCOPY WITH ENDOBRONCHIAL ULTRASOUND;  Surgeon: Lupita Leash, MD;  Location: [facility] ENDOSCOPY;  Service: Cardiopulmonary;  Laterality: N/A;    MEDICATIONS:  amLODipine (NORVASC) 2.5 MG tablet   aspirin 81 MG tablet    bisoprolol-hydrochlorothiazide (ZIAC) 10-6.25 MG tablet   cholecalciferol (VITAMIN D) 1000 units tablet   fulvestrant (FASLODEX) 250 MG/5ML injection   hydrochlorothiazide (HYDRODIURIL) 25 MG tablet   meclizine (ANTIVERT) 12.5 MG tablet   OLANZapine (ZYPREXA) 5 MG tablet   ondansetron (ZOFRAN) 8 MG tablet   potassium chloride SA (KLOR-CON M) 20 MEQ tablet   ribociclib succ (KISQALI 600MG  DAILY DOSE) 200 MG Therapy Pack   zoledronic acid (ZOMETA) 4 MG/5ML injection   No current facility-administered medications for this encounter.    If no changes, I anticipate pt can proceed with surgery as scheduled.   Rica Mast, PhD, FNP-BC [facility]/Anesthesiology Phone: [phone] 04/08/2023 10:34 AM

## 2023-04-10 ENCOUNTER — Other Ambulatory Visit: Payer: Self-pay | Admitting: Family Medicine

## 2023-04-10 DIAGNOSIS — E785 Hyperlipidemia, unspecified: Secondary | ICD-10-CM

## 2023-04-12 ENCOUNTER — Ambulatory Visit (HOSPITAL_COMMUNITY): Payer: Medicare PPO | Admitting: Emergency Medicine

## 2023-04-12 ENCOUNTER — Encounter (HOSPITAL_COMMUNITY)
Admission: RE | Disposition: A | Discharge status: Home / Self Care | Payer: Self-pay | Source: Home / Self Care | Attending: General Surgery

## 2023-04-12 ENCOUNTER — Other Ambulatory Visit: Payer: Self-pay

## 2023-04-12 ENCOUNTER — Ambulatory Visit (HOSPITAL_BASED_OUTPATIENT_CLINIC_OR_DEPARTMENT_OTHER): Payer: Medicare PPO | Admitting: Anesthesiology

## 2023-04-12 ENCOUNTER — Encounter (HOSPITAL_COMMUNITY): Payer: Self-pay | Admitting: General Surgery

## 2023-04-12 ENCOUNTER — Observation Stay (HOSPITAL_COMMUNITY)
Admission: RE | Admit: 2023-04-12 | Discharge: 2023-04-13 | Disposition: A | Discharge status: Home / Self Care | Payer: Medicare PPO | Attending: General Surgery | Admitting: General Surgery

## 2023-04-12 DIAGNOSIS — Z170 Estrogen receptor positive status [ER+]: Secondary | ICD-10-CM | POA: Insufficient documentation

## 2023-04-12 DIAGNOSIS — N6031 Fibrosclerosis of right breast: Secondary | ICD-10-CM | POA: Diagnosis not present

## 2023-04-12 DIAGNOSIS — Z7982 Long term (current) use of aspirin: Secondary | ICD-10-CM | POA: Diagnosis not present

## 2023-04-12 DIAGNOSIS — I10 Essential (primary) hypertension: Secondary | ICD-10-CM | POA: Insufficient documentation

## 2023-04-12 DIAGNOSIS — C50911 Malignant neoplasm of unspecified site of right female breast: Secondary | ICD-10-CM | POA: Diagnosis not present

## 2023-04-12 DIAGNOSIS — Z79899 Other long term (current) drug therapy: Secondary | ICD-10-CM | POA: Insufficient documentation

## 2023-04-12 DIAGNOSIS — Z9011 Acquired absence of right breast and nipple: Secondary | ICD-10-CM

## 2023-04-12 HISTORY — PX: TOTAL MASTECTOMY: SHX6129

## 2023-04-12 SURGERY — MASTECTOMY, SIMPLE
Anesthesia: Regional | Site: Breast | Laterality: Right

## 2023-04-12 MED ORDER — TRAMADOL HCL 50 MG PO TABS: 50.0000 mg | ORAL_TABLET | Freq: Four times a day (QID) | ORAL | Status: DC | PRN

## 2023-04-12 MED ORDER — ONDANSETRON HCL 4 MG/2ML IJ SOLN
INTRAMUSCULAR | Status: DC | PRN
  Administered 2023-04-12: 4 mg via INTRAVENOUS

## 2023-04-12 MED ORDER — BUPIVACAINE HCL (PF) 0.25 % IJ SOLN
INTRAMUSCULAR | Status: DC | PRN
  Administered 2023-04-12: 30 mL via PERINEURAL

## 2023-04-12 MED ORDER — PHENYLEPHRINE 80 MCG/ML (10ML) SYRINGE FOR IV PUSH (FOR BLOOD PRESSURE SUPPORT)
PREFILLED_SYRINGE | INTRAVENOUS | Status: AC
  Filled 2023-04-12: qty 20

## 2023-04-12 MED ORDER — LIDOCAINE 2% (20 MG/ML) 5 ML SYRINGE
INTRAMUSCULAR | Status: AC
  Filled 2023-04-12: qty 10

## 2023-04-12 MED ORDER — ACETAMINOPHEN 500 MG PO TABS
1000.0000 mg | ORAL_TABLET | Freq: Four times a day (QID) | ORAL | Status: DC
  Administered 2023-04-12 – 2023-04-13 (×3): 1000 mg via ORAL
  Filled 2023-04-12 (×3): qty 2

## 2023-04-12 MED ORDER — LACTATED RINGERS IV SOLN: INTRAVENOUS | Status: DC

## 2023-04-12 MED ORDER — POTASSIUM CHLORIDE CRYS ER 20 MEQ PO TBCR
20.0000 meq | EXTENDED_RELEASE_TABLET | Freq: Two times a day (BID) | ORAL | Status: DC
  Administered 2023-04-13: 20 meq via ORAL
  Filled 2023-04-12: qty 1

## 2023-04-12 MED ORDER — HYDROCHLOROTHIAZIDE 12.5 MG PO TABS
6.2500 mg | ORAL_TABLET | Freq: Every day | ORAL | Status: DC
  Administered 2023-04-13: 6.25 mg via ORAL
  Filled 2023-04-12: qty 1

## 2023-04-12 MED ORDER — FENTANYL CITRATE (PF) 100 MCG/2ML IJ SOLN: 50.0000 ug | Freq: Once | INTRAMUSCULAR | Status: AC

## 2023-04-12 MED ORDER — LIDOCAINE 2% (20 MG/ML) 5 ML SYRINGE
INTRAMUSCULAR | Status: DC | PRN
  Administered 2023-04-12: 100 mg via INTRAVENOUS

## 2023-04-12 MED ORDER — SODIUM CHLORIDE 0.9 % IV SOLN: INTRAVENOUS | Status: DC

## 2023-04-12 MED ORDER — OXYCODONE HCL 5 MG PO TABS: 5.0000 mg | ORAL_TABLET | Freq: Once | ORAL | Status: DC | PRN

## 2023-04-12 MED ORDER — SUCCINYLCHOLINE CHLORIDE 200 MG/10ML IV SOSY
PREFILLED_SYRINGE | INTRAVENOUS | Status: AC
  Filled 2023-04-12: qty 10

## 2023-04-12 MED ORDER — ORAL CARE MOUTH RINSE: 15.0000 mL | Freq: Once | OROMUCOSAL | Status: AC

## 2023-04-12 MED ORDER — 0.9 % SODIUM CHLORIDE (POUR BTL) OPTIME
TOPICAL | Status: DC | PRN
  Administered 2023-04-12: 1000 mL

## 2023-04-12 MED ORDER — OXYCODONE HCL 5 MG/5ML PO SOLN: 5.0000 mg | Freq: Once | ORAL | Status: DC | PRN

## 2023-04-12 MED ORDER — ONDANSETRON HCL 4 MG/2ML IJ SOLN: 4.0000 mg | Freq: Four times a day (QID) | INTRAMUSCULAR | Status: DC | PRN

## 2023-04-12 MED ORDER — CEFAZOLIN SODIUM-DEXTROSE 2-4 GM/100ML-% IV SOLN
2.0000 g | INTRAVENOUS | Status: AC
  Administered 2023-04-12: 2 g via INTRAVENOUS
  Filled 2023-04-12: qty 100

## 2023-04-12 MED ORDER — CHLORHEXIDINE GLUCONATE CLOTH 2 % EX PADS: 6.0000 | MEDICATED_PAD | Freq: Once | CUTANEOUS | Status: DC

## 2023-04-12 MED ORDER — AMLODIPINE BESYLATE 2.5 MG PO TABS
2.5000 mg | ORAL_TABLET | Freq: Every day | ORAL | Status: DC
  Administered 2023-04-13: 2.5 mg via ORAL
  Filled 2023-04-12: qty 1

## 2023-04-12 MED ORDER — CHLORHEXIDINE GLUCONATE 0.12 % MT SOLN
15.0000 mL | Freq: Once | OROMUCOSAL | Status: AC
  Administered 2023-04-12: 15 mL via OROMUCOSAL
  Filled 2023-04-12: qty 15

## 2023-04-12 MED ORDER — PROPOFOL 10 MG/ML IV BOLUS
INTRAVENOUS | Status: AC
  Filled 2023-04-12: qty 20

## 2023-04-12 MED ORDER — MIDAZOLAM HCL 2 MG/2ML IJ SOLN
INTRAMUSCULAR | Status: AC
  Administered 2023-04-12: 1 mg via INTRAVENOUS
  Filled 2023-04-12: qty 2

## 2023-04-12 MED ORDER — FENTANYL CITRATE (PF) 100 MCG/2ML IJ SOLN: 25.0000 ug | INTRAMUSCULAR | Status: DC | PRN

## 2023-04-12 MED ORDER — ENSURE PRE-SURGERY PO LIQD: 296.0000 mL | Freq: Once | ORAL | Status: DC

## 2023-04-12 MED ORDER — BISOPROLOL FUMARATE 10 MG PO TABS
10.0000 mg | ORAL_TABLET | Freq: Every day | ORAL | Status: DC
  Administered 2023-04-13: 10 mg via ORAL
  Filled 2023-04-12: qty 1

## 2023-04-12 MED ORDER — BISOPROLOL-HYDROCHLOROTHIAZIDE 10-6.25 MG PO TABS: 1.0000 | ORAL_TABLET | Freq: Every day | ORAL | Status: DC

## 2023-04-12 MED ORDER — OLANZAPINE 5 MG PO TABS
5.0000 mg | ORAL_TABLET | Freq: Every day | ORAL | Status: DC
  Administered 2023-04-12: 5 mg via ORAL
  Filled 2023-04-12: qty 1

## 2023-04-12 MED ORDER — ONDANSETRON HCL 4 MG/2ML IJ SOLN
INTRAMUSCULAR | Status: AC
  Filled 2023-04-12: qty 4

## 2023-04-12 MED ORDER — AMISULPRIDE (ANTIEMETIC) 5 MG/2ML IV SOLN: 10.0000 mg | Freq: Once | INTRAVENOUS | Status: DC | PRN

## 2023-04-12 MED ORDER — FENTANYL CITRATE (PF) 100 MCG/2ML IJ SOLN
INTRAMUSCULAR | Status: AC
  Administered 2023-04-12: 50 ug via INTRAVENOUS
  Filled 2023-04-12: qty 2

## 2023-04-12 MED ORDER — ACETAMINOPHEN 500 MG PO TABS
1000.0000 mg | ORAL_TABLET | ORAL | Status: AC
  Administered 2023-04-12: 1000 mg via ORAL
  Filled 2023-04-12: qty 2

## 2023-04-12 MED ORDER — PROPOFOL 10 MG/ML IV BOLUS
INTRAVENOUS | Status: DC | PRN
  Administered 2023-04-12: 30 mg via INTRAVENOUS
  Administered 2023-04-12: 150 mg via INTRAVENOUS

## 2023-04-12 MED ORDER — MIDAZOLAM HCL 2 MG/2ML IJ SOLN: 1.0000 mg | Freq: Once | INTRAMUSCULAR | Status: AC

## 2023-04-12 MED ORDER — FENTANYL CITRATE (PF) 250 MCG/5ML IJ SOLN
INTRAMUSCULAR | Status: AC
  Filled 2023-04-12: qty 5

## 2023-04-12 MED ORDER — PHENYLEPHRINE HCL-NACL 20-0.9 MG/250ML-% IV SOLN
INTRAVENOUS | Status: AC
  Filled 2023-04-12: qty 250

## 2023-04-12 MED ORDER — METHOCARBAMOL 500 MG PO TABS: 500.0000 mg | ORAL_TABLET | Freq: Three times a day (TID) | ORAL | Status: DC | PRN

## 2023-04-12 MED ORDER — MECLIZINE HCL 12.5 MG PO TABS: 12.5000 mg | ORAL_TABLET | Freq: Three times a day (TID) | ORAL | Status: DC | PRN

## 2023-04-12 MED ORDER — SIMETHICONE 80 MG PO CHEW: 40.0000 mg | CHEWABLE_TABLET | Freq: Four times a day (QID) | ORAL | Status: DC | PRN

## 2023-04-12 MED ORDER — METHOCARBAMOL 1000 MG/10ML IJ SOLN: 500.0000 mg | Freq: Three times a day (TID) | INTRAVENOUS | Status: DC | PRN

## 2023-04-12 MED ORDER — FENTANYL CITRATE (PF) 250 MCG/5ML IJ SOLN
INTRAMUSCULAR | Status: DC | PRN
  Administered 2023-04-12 (×2): 25 ug via INTRAVENOUS

## 2023-04-12 MED ORDER — ONDANSETRON 4 MG PO TBDP: 4.0000 mg | ORAL_TABLET | Freq: Four times a day (QID) | ORAL | Status: DC | PRN

## 2023-04-12 MED ORDER — DEXAMETHASONE SODIUM PHOSPHATE 10 MG/ML IJ SOLN
INTRAMUSCULAR | Status: AC
  Filled 2023-04-12: qty 4

## 2023-04-12 SURGICAL SUPPLY — 47 items
ADH SKN CLS APL DERMABOND .7 (GAUZE/BANDAGES/DRESSINGS) ×1
ADH SKN CLS LQ APL DERMABOND (GAUZE/BANDAGES/DRESSINGS) ×1
APL PRP STRL LF DISP 70% ISPRP (MISCELLANEOUS) ×1
APPLIER CLIP 9.375 MED OPEN (MISCELLANEOUS) ×1
APR CLP MED 9.3 20 MLT OPN (MISCELLANEOUS) ×1
BAG COUNTER SPONGE SURGICOUNT (BAG) ×1 IMPLANT
BAG SPNG CNTER NS LX DISP (BAG) ×1
BINDER BREAST LRG (GAUZE/BANDAGES/DRESSINGS) IMPLANT
BINDER BREAST XLRG (GAUZE/BANDAGES/DRESSINGS) IMPLANT
BIOPATCH RED 1 DISK 7.0 (GAUZE/BANDAGES/DRESSINGS) ×1 IMPLANT
CHLORAPREP W/TINT 26 (MISCELLANEOUS) ×1 IMPLANT
CLIP APPLIE 9.375 MED OPEN (MISCELLANEOUS) ×1 IMPLANT
COVER SURGICAL LIGHT HANDLE (MISCELLANEOUS) ×1 IMPLANT
DERMABOND ADVANCED .7 DNX12 (GAUZE/BANDAGES/DRESSINGS) ×1 IMPLANT
DERMABOND ADVANCED .7 DNX6 (GAUZE/BANDAGES/DRESSINGS) IMPLANT
DRAIN CHANNEL 19F RND (DRAIN) ×1 IMPLANT
DRAPE TOP ARMCOVERS (MISCELLANEOUS) ×1 IMPLANT
DRAPE U-SHAPE 76X120 STRL (DRAPES) ×1 IMPLANT
DRSG TEGADERM 4X4.75 (GAUZE/BANDAGES/DRESSINGS) ×1 IMPLANT
ELECT BLADE 4.0 EZ CLEAN MEGAD (MISCELLANEOUS) ×1
ELECT CAUTERY BLADE 6.4 (BLADE) ×1 IMPLANT
ELECT REM PT RETURN 9FT ADLT (ELECTROSURGICAL) ×1
ELECTRODE BLDE 4.0 EZ CLN MEGD (MISCELLANEOUS) ×1 IMPLANT
ELECTRODE REM PT RTRN 9FT ADLT (ELECTROSURGICAL) ×1 IMPLANT
EVACUATOR SILICONE 100CC (DRAIN) ×1 IMPLANT
GAUZE PAD ABD 8X10 STRL (GAUZE/BANDAGES/DRESSINGS) ×2 IMPLANT
GLOVE BIO SURGEON STRL SZ7 (GLOVE) ×1 IMPLANT
GLOVE BIOGEL PI IND STRL 7.5 (GLOVE) ×1 IMPLANT
GOWN STRL REUS W/ TWL LRG LVL3 (GOWN DISPOSABLE) ×3 IMPLANT
GOWN STRL REUS W/TWL LRG LVL3 (GOWN DISPOSABLE) ×3
HEMOSTAT ARISTA ABSORB 3G PWDR (HEMOSTASIS) IMPLANT
KIT BASIN OR (CUSTOM PROCEDURE TRAY) ×1 IMPLANT
KIT TURNOVER KIT B (KITS) ×1 IMPLANT
NS IRRIG 1000ML POUR BTL (IV SOLUTION) ×1 IMPLANT
PACK GENERAL/GYN (CUSTOM PROCEDURE TRAY) ×1 IMPLANT
PAD ARMBOARD 7.5X6 YLW CONV (MISCELLANEOUS) ×1 IMPLANT
PENCIL SMOKE EVACUATOR (MISCELLANEOUS) ×1 IMPLANT
STRIP CLOSURE SKIN 1/2X4 (GAUZE/BANDAGES/DRESSINGS) ×1 IMPLANT
SUT ETHILON 2 0 FS 18 (SUTURE) ×1 IMPLANT
SUT ETHILON 3 0 FSL (SUTURE) IMPLANT
SUT MNCRL AB 4-0 PS2 18 (SUTURE) ×1 IMPLANT
SUT SILK 2 0 SH (SUTURE) ×1 IMPLANT
SUT VIC AB 2-0 SH 18 (SUTURE) ×1 IMPLANT
SUT VIC AB 3-0 SH 18 (SUTURE) ×1 IMPLANT
SUT VIC AB 3-0 SH 8-18 (SUTURE) IMPLANT
TOWEL GREEN STERILE (TOWEL DISPOSABLE) ×1 IMPLANT
TOWEL GREEN STERILE FF (TOWEL DISPOSABLE) ×1 IMPLANT

## 2023-04-12 NOTE — Transfer of Care (Signed)
Immediate Anesthesia Transfer of Care Note  Patient: Tracy Thompson  Procedure(s) Performed: RIGHT TOTAL MASTECTOMY (Right: Breast)  Patient Location: PACU  Anesthesia Type:GA combined with regional for post-op pain  Level of Consciousness: drowsy and patient cooperative  Airway & Oxygen Therapy: Patient Spontanous Breathing and Patient connected to nasal cannula oxygen  Post-op Assessment: Report given to RN, Post -op Vital signs reviewed and stable, Patient moving all extremities X 4, and Patient able to stick tongue midline  Post vital signs: Reviewed and stable  Last Vitals:  Vitals Value Taken Time  BP 182/75   Temp    Pulse 92 04/12/23 1415  Resp 13 04/12/23 1415  SpO2 100 % 04/12/23 1415  Vitals shown include unvalidated device data.  Last Pain:  Vitals:   04/12/23 1240  TempSrc:   PainSc: 0-No pain      Patients Stated Pain Goal: 0 (04/12/23 1204)  Complications:  Encounter Notable Events  Notable Event Outcome Phase Comment  Difficult to intubate - unexpected  Intraprocedure Filed from anesthesia note documentation.

## 2023-04-12 NOTE — Op Note (Signed)
Preoperative diagnosis: Stage IV breast cancer with right breast mass Postoperative diagnosis: Same as above Procedure: Right mastectomy Surgeon: Dr. Harden Mo Anesthesia: General with a pectoral block Estimated blood loss: 30 cc Specimens: Right breast tissue marked short stitch superior, long stitch lateral Complications: None Drains: 19 [place] Blake drain placed in the mastectomy site Sponge needle count correct Disposition to recovery stable addition  Indications: This is a 72 year old female who had lumpectomy, chemotherapy, radiotherapy 1994.  She did well until she developed bony metastatic disease.  She also has hilar disease that was biopsied and is ER/PR positive at 20%.  She is undergoing treatment.  She has had progression of a breast lesion on this therapy.  This is now coming through the skin and causing her pain.  PET scan shows a response to therapy in the chest as well as her bony disease but the area in the breast is getting larger.  She would like to consider mastectomy due to her significant radiation fibrosis and her symptoms.  We discussed proceeding.  Procedure: After informed consent was obtained she first underwent a pectoral block.  She was given antibiotics.  SCDs were placed.  She was placed under general anesthesia without complication.  She was prepped and draped in standard sterile surgical fashion.  Surgical timeout was then performed.  I made an elliptical incision to encompass the mass in the medial portion of the breast.  I then created flaps to the parasternal region and the superior portion of the breast as well as to the anterior axillary line.  The inferior portion of the incision was at the inframammary fold.  I then was some difficulty due to her prior radiation remove the breast and the fascia.  I did have to remove some of the pectoralis muscle in the region of this mass.  I then marked this as above and passed off the table.  Hemostasis was obtained.   I did place some Arista in the cavity.  I placed a 70 [place] Blake drain and secured this with a 2-0 nylon suture.  I then closed this with 3-0 Vicryl sutures.  The skin was closed with 4 Monocryl and glue.  I did place three 2-0 nylon sutures in the skin to take some of the tension off as well.  She tolerated this well was extubated and transferred recovery stable.

## 2023-04-12 NOTE — Plan of Care (Signed)

## 2023-04-12 NOTE — Anesthesia Procedure Notes (Signed)
Anesthesia Regional Block: Pectoralis block   Pre-Anesthetic Checklist: , timeout performed,  Correct Patient, Correct Site, Correct Laterality,  Correct Procedure, Correct Position, site marked,  Risks and benefits discussed,  Surgical consent,  Pre-op evaluation,  At surgeon's request and post-op pain management  Laterality: Right  Prep: chloraprep       Needles:  Injection technique: Single-shot  Needle Type: Echogenic Stimulator Needle     Needle Length: 9cm  Needle Gauge: 21     Additional Needles:   Procedures:,,,, ultrasound used (permanent image in chart),,    Narrative:  Start time: 04/12/2023 12:27 PM End time: 04/12/2023 12:30 PM Injection made incrementally with aspirations every 5 mL.  Performed by: Personally  Anesthesiologist: Linton Rump, MD  Additional Notes: Discussed risks and benefits of nerve block including, but not limited to, prolonged and/or permanent nerve injury involving sensory and/or motor function. Monitors were applied and a time-out was performed. The nerve and associated structures were visualized under ultrasound guidance. After negative aspiration, local anesthetic was slowly injected around the nerve. There was no evidence of high pressure during the procedure. There were no paresthesias. VSS remained stable and the patient tolerated the procedure well.

## 2023-04-12 NOTE — H&P (Signed)
  71 yof who had lumpectomy/chemo/xrt in 1994. She did well until she developed bony metastatic disease. She also has hilar disease that was biopsied and is er/pr pos at 20%. She is being treated with faslodex, ribociclib. She has had progression of the breast lesion on this therapy. No biopsy done as it is coming through skin. This is causing her pain as well. PET recently shows response to therapy in chest and bony disease. She would like to consider mastectomy due to symptoms and would also be able to send this area for pathology to see if tumor has changed. She is here with her husband today. She has undergone imaging at [facility] that shows a 1.8 cm area by [place].  Review of Systems: A complete review of systems was obtained from the patient. I have reviewed this information and discussed as appropriate with the patient. See HPI as well for other ROS.  Review of Systems Cardiovascular: Positive for leg swelling. All other systems reviewed and are negative.   Medical History: Past Medical History: Diagnosis Date History of cancer Hypertension   Past Surgical History: Procedure Laterality Date LAPAROSCOPIC CHOLECYSTECTOMY MASTECTOMY PARTIAL / LUMPECTOMY  No Known Allergies  Current Outpatient Medications on File Prior to Visit Medication Sig Dispense Refill amLODIPine (NORVASC) 2.5 MG tablet TAKE 1 TABLET(2.5 MG) BY MOUTH DAILY aspirin 81 MG EC tablet Take 81 mg by mouth once daily bisoproloL-hydroCHLOROthiazide (ZIAC) 10-6.25 mg tablet Take 1 tablet by mouth once daily cholecalciferol (VITAMIN D3) 1000 unit tablet Take by mouth meclizine (ANTIVERT) 12.5 mg tablet Take 12.5 mg by mouth 3 (three) times daily as needed potassium chloride (KLOR-CON M20) 20 MEQ ER tablet Take 20 mEq by mouth 2 (two) times daily   Family History Problem Relation Age of Onset High blood pressure (Hypertension) Mother Colon cancer Mother Skin cancer Father Obesity Father High blood pressure  (Hypertension) Father Diabetes Father Deep vein thrombosis (DVT or abnormal blood clot formation) Father   Social History  Tobacco Use Smoking Status Never Smokeless Tobacco Never Marital status: Married Tobacco Use Smoking status: Never Smokeless tobacco: Never Substance and Sexual Activity Alcohol use: Not Currently Drug use: Never  Objective:  Vitals: 03/31/23 1530 BP: 101/66 Pulse: 81 Temp: 36.1 C (97 F) SpO2: 99% Weight: 76.2 kg (168 lb) Height: 167.6 cm (5\' 6" )  Body mass index is 27.12 kg/m.  Physical Exam Vitals reviewed. Constitutional: Appearance: Normal appearance. Chest: Breasts: Right: No nipple discharge. Comments: 2x3 cm mass at medial aspect of the scar on right breast Lymphadenopathy: Upper Body: Right upper body: No axillary adenopathy. Neurological: Mental Status: She is alert.  Assessment and Plan:  Chest wall recurrence of breast cancer, right (CMS/HHS-HCC)  Right mastectomy  I think this is reasonable to consider, we discussed possible punch biopsy but she really would like this area excised that is causing her a fair amount of symptoms. I think that is reasonable. I think she would tolerate this as well. We discussed a simple mastectomy on this side. Just to remove this lesion is almost already going to be a mastectomy due to the shrunken nature of that breast after radiation previously as well. We discussed surgery, overnight hospital stay, risks, as well as drain placement. I will plan on scheduling her soon.

## 2023-04-12 NOTE — Interval H&P Note (Signed)
History and Physical Interval Note:  04/12/2023 12:18 PM  Tracy Thompson  has presented today for surgery, with the diagnosis of RIGHT BREAST CANCER.  The various methods of treatment have been discussed with the patient and family. After consideration of risks, benefits and other options for treatment, the patient has consented to  Procedure(s): RIGHT TOTAL MASTECTOMY (Right) as a surgical intervention.  The patient's history has been reviewed, patient examined, no change in status, stable for surgery.  I have reviewed the patient's chart and labs.  Questions were answered to the patient's satisfaction.     Emelia Loron

## 2023-04-12 NOTE — Discharge Instructions (Signed)
[facility] [phone]  MASTECTOMY: POST OP INSTRUCTIONS Take 400 mg of ibuprofen every 8 hours or 650 mg tylenol every 6 hours for next 72 hours then as needed. Use ice several times daily also. Always review your discharge instruction sheet given to you by the facility where your surgery was performed.  A prescription for pain medication may be given to you upon discharge.  Take your pain medication as prescribed, if needed.  If narcotic pain medicine is not needed, then you may take acetaminophen (Tylenol), naprosyn (Alleve) or ibuprofen (Advil) as needed. Take your usually prescribed medications unless otherwise directed. If you need a refill on your pain medication, please contact your pharmacy.  They will contact our office to request authorization.  Prescriptions will not be filled after 5pm or on week-ends. You should follow a light diet the first 24 hours after surgery.  Resume your normal diet the day after surgery. Most patients will experience some swelling and bruising on the chest and underarm.  Ice packs will help.  Swelling and bruising can take several days to resolve. Wear the binder or Prairie bra for 72 hours day and night. After night please wear during the day.  It is common to experience some constipation if taking pain medication after surgery.  Increasing fluid intake and taking a stool softener (such as Colace) will usually help or prevent this problem from occurring.  A mild laxative (Milk of Magnesia or Miralax) should be taken according to package instructions if there are no bowel movements after 48 hours. There is glue and steristrips on your incision. They will come off in the next few weeks.  You may take a shower 48 hours after surgery.  Any sutures will be removed at an office visit DRAINS:  If you have drains in place, it is important to keep a list of the amount of drainage produced each day in your drains.  Before leaving the hospital, you  should be instructed on drain care.  Call our office if you have any questions about your drains. I will remove your drains when they put out less than 30 cc or ml for 2 consecutive days. ACTIVITIES:  You may resume regular (light) daily activities beginning the next day--such as daily self-care, walking, climbing stairs--gradually increasing activities as tolerated.  You may have sexual intercourse when it is comfortable.  Refrain from any heavy lifting or straining until approved by your doctor. You may drive when you are no longer taking prescription pain medication, you can comfortably wear a seatbelt, and you can safely maneuver your car and apply brakes. RETURN TO WORK:  __________________________________________________________ You should see your doctor in the office for a follow-up appointment approximately 3-5 days after your surgery.  Your doctor's nurse will typically make your follow-up appointment when she calls you with your pathology report.  Expect your pathology report 3-4business days after surgery. OTHER INSTRUCTIONS: ______________________________________________________________________________________________ ____________________________________________________________________________________________ WHEN TO CALL YOUR DR Daneen Volcy: Fever over 101.0 Nausea and/or vomiting Extreme swelling or bruising Continued bleeding from incision. Increased pain, redness, or drainage from the incision. The clinic staff is available to answer your questions during regular business hours.  Please don't hesitate to call and ask to speak to one of the nurses for clinical concerns.  If you have a medical emergency, go to the nearest emergency room or call 911.  A surgeon from [facility] is always on call at the hospital. [address], [address], [place], [place]    [address] ? [address] [address], [place], [place]   [address] [phone] ? [phone] ? FAX [phone] Web site:  [email or web address]    About my Jackson-Pratt Bulb Drain  What is a Jackson-Pratt bulb? A Jackson-Pratt is a soft, round device used to collect drainage. It is connected to a long, thin drainage catheter, which is held in place by one or two small stiches near your surgical incision site. When the bulb is squeezed, it forms a vacuum, forcing the drainage to empty into the bulb.  Emptying the Jackson-Pratt bulb- To empty the bulb: 1. Release the plug on the top of the bulb. 2. Pour the bulb's contents into a measuring container which your nurse will provide. 3. Record the time emptied and amount of drainage. Empty the drain(s) as often as your     doctor or nurse recommends.  Date                  Time                    Amount (Drain 1)                 Amount (Drain 2)  _____________________________________________________________________  _____________________________________________________________________  _____________________________________________________________________  _____________________________________________________________________  _____________________________________________________________________  _____________________________________________________________________  _____________________________________________________________________  _____________________________________________________________________  Squeezing the Jackson-Pratt Bulb- To squeeze the bulb: 1. Make sure the plug at the top of the bulb is open. 2. Squeeze the bulb tightly in your fist. You will hear air squeezing from the bulb. 3. Replace the plug while the bulb is squeezed. 4. Use a safety pin to attach the bulb to your clothing. This will keep the catheter from     pulling at the bulb insertion site.  When to call your doctor- Call your doctor if: Drain site becomes red, swollen or hot. You have a fever greater than 101 degrees F. There is oozing at the drain  site. Drain falls out (apply a guaze bandage over the drain hole and secure it with tape). Drainage increases daily not related to activity patterns. (You will usually have more drainage when you are active than when you are resting.) Drainage has a bad odor.   

## 2023-04-12 NOTE — Anesthesia Procedure Notes (Signed)
Procedure Name: LMA Insertion Date/Time: 04/12/2023 1:06 PM  Performed by: Linton Rump, MDPre-anesthesia Checklist: Patient identified, Emergency Drugs available, Suction available and Patient being monitored Patient Re-evaluated:Patient Re-evaluated prior to induction Oxygen Delivery Method: Circle system utilized Preoxygenation: Pre-oxygenation with 100% oxygen Induction Type: IV induction Ventilation: Mask ventilation without difficulty LMA: LMA inserted LMA Size: 4.0 Number of attempts: 1 Placement Confirmation: ETT inserted through vocal cords under direct vision, positive ETCO2 and breath sounds checked- equal and bilateral Tube secured with: Tape Dental Injury: Teeth and Oropharynx as per pre-operative assessment  Difficulty Due To: Difficulty was unanticipated Comments: LMA placement by Natividad Brood SRNA

## 2023-04-13 ENCOUNTER — Encounter (HOSPITAL_COMMUNITY): Payer: Self-pay | Admitting: General Surgery

## 2023-04-13 ENCOUNTER — Encounter: Payer: Self-pay | Admitting: *Deleted

## 2023-04-13 ENCOUNTER — Other Ambulatory Visit: Payer: Self-pay

## 2023-04-13 DIAGNOSIS — C50911 Malignant neoplasm of unspecified site of right female breast: Secondary | ICD-10-CM | POA: Diagnosis not present

## 2023-04-13 MED ORDER — TRAMADOL HCL 50 MG PO TABS: 50.0000 mg | ORAL_TABLET | Freq: Four times a day (QID) | ORAL | 0 refills | Status: DC | PRN

## 2023-04-13 NOTE — Progress Notes (Signed)
Pt has DC order, pt had breakfast and education on removing the JP was done by Night shift, no further questions were asked by pt, pt was informed to monitor the output. Pt was encouraged to do incentive spirometer. AVS was given to pt. Pending pick-up by husband.

## 2023-04-13 NOTE — Discharge Summary (Signed)
Physician Discharge Summary  Patient ID: Tracy Thompson MRN: [id number] DOB/AGE: 1951-05-04 72 y.o.  Admit date: 04/12/2023 Discharge date: 04/13/2023  Admission Diagnoses: Stage IV breast cancer  Discharge Diagnoses:  Principal Problem:   S/P mastectomy, right   Discharged Condition: good  Hospital Course: 59 yof being treated for stage IV breast cancer with right breast mass that has been progressive on therapy.  Due to symptoms and progression we discussed mastectomy. This was uneventful and she is well the following morning. Drain as expected.   Consults: None  Significant Diagnostic Studies: none  Treatments: surgery: right mastectomy  Discharge Exam: Blood pressure 120/76, pulse 87, temperature (!) 97.4 F (36.3 C), temperature source Oral, resp. rate 16, height 5\' 6"  (1.676 m), weight 77.1 kg, SpO2 98 %. No hematoma, drain serosang as expected  Disposition: Discharge disposition: 01-Home or Self Care        Allergies as of 04/13/2023   No Known Allergies      Medication List     TAKE these medications    amLODipine 2.5 MG tablet Commonly known as: NORVASC TAKE 1 TABLET(2.5 MG) BY MOUTH DAILY   aspirin 81 MG tablet Take 81 mg by mouth daily.   bisoprolol-hydrochlorothiazide 10-6.25 MG tablet Commonly known as: ZIAC TAKE 1 TABLET BY MOUTH DAILY   cholecalciferol 1000 units tablet Commonly known as: VITAMIN D Take 1,000 Units by mouth daily.   fulvestrant 250 MG/5ML injection Commonly known as: FASLODEX Inject into the muscle every 30 (thirty) days. One injection each buttock over 1-2 minutes. Warm prior to use.   hydrochlorothiazide 25 MG tablet Commonly known as: HYDRODIURIL Take 1 tablet (25 mg total) by mouth daily.   Kisqali (600 MG Dose) 200 MG Therapy Pack Generic drug: ribociclib succ Take 3 tablets (600 mg total) by mouth daily. Take for 21 days on, 7 days off, repeat every 28 days.   meclizine 12.5 MG tablet Commonly known as:  ANTIVERT Take 1 tablet (12.5 mg total) by mouth 3 (three) times daily as needed for dizziness.   OLANZapine 5 MG tablet Commonly known as: ZYPREXA TAKE 1 TABLET(5 MG) BY MOUTH AT BEDTIME   ondansetron 8 MG tablet Commonly known as: ZOFRAN TAKE 1 TABLET(8 MG) BY MOUTH EVERY 8 HOURS AS NEEDED FOR NAUSEA OR VOMITING   potassium chloride SA 20 MEQ tablet Commonly known as: KLOR-CON M Take 1 tablet (20 mEq total) by mouth 2 (two) times daily.   traMADol 50 MG tablet Commonly known as: ULTRAM Take 1 tablet (50 mg total) by mouth every 6 (six) hours as needed (mild pain).   zoledronic acid 4 MG/5ML injection Commonly known as: ZOMETA Inject 4 mg into the vein every 3 (three) months.        Follow-up Information     Emelia Loron, MD Follow up in 3 week(s).   Specialty: General Surgery Contact information: [address] [place] [address] [phone]                 Signed: Emelia Loron 04/13/2023, 5:10 AM

## 2023-04-13 NOTE — Progress Notes (Signed)
Patient had her R mastectomy yesterday. She will be discharged from the hospital today. Will follow for path.   Oncology Nurse Navigator Documentation     04/13/2023    8:45 AM  Oncology Nurse Navigator Flowsheets  Navigator Follow Up Date: 04/15/2023  Navigator Follow Up Reason: Pathology  Navigator Location [facility]  Navigator Encounter Type Appt/Treatment Plan Review  Patient Visit Type MedOnc  Treatment Phase Active Tx  Barriers/Navigation Needs Coordination of Care;Education  Interventions None Required  Acuity Level 2-Minimal Needs (1-2 Barriers Identified)  Support Groups/Services Friends and Family  Time Spent with Patient 15

## 2023-04-14 ENCOUNTER — Other Ambulatory Visit (HOSPITAL_COMMUNITY): Payer: Medicare PPO

## 2023-04-14 NOTE — Anesthesia Postprocedure Evaluation (Signed)
Anesthesia Post Note  Patient: Tracy Thompson  Procedure(s) Performed: RIGHT TOTAL MASTECTOMY (Right: Breast)     Patient location during evaluation: PACU Anesthesia Type: Regional and General Level of consciousness: awake and alert Pain management: pain level controlled Vital Signs Assessment: post-procedure vital signs reviewed and stable Respiratory status: spontaneous breathing, nonlabored ventilation and respiratory function stable Cardiovascular status: blood pressure returned to baseline and stable Postop Assessment: no apparent nausea or vomiting Anesthetic complications: yes   Encounter Notable Events  Notable Event Outcome Phase Comment  Difficult to intubate - unexpected  Intraprocedure Filed from anesthesia note documentation.    Last Vitals:  Vitals:   04/13/23 0406 04/13/23 0742  BP: 120/76 131/76  Pulse: 87 91  Resp: 16 16  Temp: (!) 36.3 C 36.8 C  SpO2: 98% 100%    Last Pain:  Vitals:   04/13/23 0742  TempSrc: Oral  PainSc: 0-No pain                 Gianmarco Roye

## 2023-04-18 ENCOUNTER — Encounter: Payer: Self-pay | Admitting: *Deleted

## 2023-04-18 LAB — SURGICAL PATHOLOGY

## 2023-04-18 NOTE — Progress Notes (Signed)
Reviewed pathology with Dr Myna Hidalgo. Negative or any malignant findings. No further testing requests. Patient will follow up with Dr Myna Hidalgo in 4-6 weeks. Message sent to scheduling.   Oncology Nurse Navigator Documentation     04/18/2023   12:00 PM  Oncology Nurse Navigator Flowsheets  Navigator Follow Up Date: 04/25/2023  Navigator Follow Up Reason: Scan Review  Navigator Location [facility]  Navigator Encounter Type Pathology Review  Patient Visit Type MedOnc  Treatment Phase Active Tx  Barriers/Navigation Needs Coordination of Care;Education  Interventions Coordination of Care  Acuity Level 2-Minimal Needs (1-2 Barriers Identified)  Coordination of Care Pathology  Support Groups/Services Friends and Family  Time Spent with Patient 15

## 2023-04-25 ENCOUNTER — Telehealth: Payer: Self-pay

## 2023-04-25 ENCOUNTER — Other Ambulatory Visit (HOSPITAL_COMMUNITY): Payer: Self-pay

## 2023-04-25 ENCOUNTER — Encounter (HOSPITAL_COMMUNITY)
Admission: RE | Admit: 2023-04-25 | Discharge: 2023-04-25 | Disposition: A | Discharge status: Home / Self Care | Payer: Medicare PPO | Source: Ambulatory Visit | Attending: Hematology & Oncology | Admitting: Hematology & Oncology

## 2023-04-25 DIAGNOSIS — C50911 Malignant neoplasm of unspecified site of right female breast: Secondary | ICD-10-CM | POA: Insufficient documentation

## 2023-04-25 DIAGNOSIS — C7951 Secondary malignant neoplasm of bone: Secondary | ICD-10-CM | POA: Insufficient documentation

## 2023-04-25 LAB — GLUCOSE, CAPILLARY: Glucose-Capillary: 99 mg/dL (ref 70–99)

## 2023-04-25 MED ORDER — FLUDEOXYGLUCOSE F - 18 (FDG) INJECTION
10.0000 | Freq: Once | INTRAVENOUS | Status: AC | PRN
  Administered 2023-04-25: 8.16 via INTRAVENOUS

## 2023-04-25 NOTE — Telephone Encounter (Signed)
Oral Oncology Patient Advocate Encounter  Re-authorization   Received notification that prior authorization for Idelle Jo is required.   PA submitted on 04/25/23  Key [id number]  Status is pending     Ardeen Fillers, CPhT Oncology Pharmacy Patient Advocate  [facility]  [phone] (phone) [phone] (fax) 04/25/2023 7:56 AM

## 2023-04-25 NOTE — Telephone Encounter (Signed)
Oral Oncology Patient Advocate Encounter  Prior Authorization for Tracy Thompson has been approved.    PA# [id number]  Effective dates: 04/25/23 through 12/27/23.  Patient may continue to fill with [facility].     Ardeen Fillers, CPhT Oncology Pharmacy Patient Advocate  [facility]  [phone] (phone) [phone] (fax) 04/25/2023 8:43 AM

## 2023-04-27 ENCOUNTER — Telehealth: Payer: Self-pay

## 2023-04-27 ENCOUNTER — Encounter: Payer: Self-pay | Admitting: *Deleted

## 2023-04-27 NOTE — Telephone Encounter (Signed)
Advised via MyChart.

## 2023-04-27 NOTE — Progress Notes (Signed)
Reviewed PET scan. Continues to show good disease control.  Oncology Nurse Navigator Documentation     04/27/2023   12:30 PM  Oncology Nurse Navigator Flowsheets  Navigator Follow Up Date: 05/26/2023  Navigator Follow Up Reason: Follow-up Appointment  Navigator Location [facility]  Navigator Encounter Type Scan Review  Patient Visit Type MedOnc  Treatment Phase Active Tx  Barriers/Navigation Needs Coordination of Care;Education  Interventions None Required  Acuity Level 2-Minimal Needs (1-2 Barriers Identified)  Support Groups/Services Friends and Family  Time Spent with Patient 15

## 2023-04-27 NOTE — Telephone Encounter (Signed)
-----   Message from Josph Macho, MD sent at 04/27/2023  3:39 PM EDT ----- Call and let him know that the PET scan does not show any active breast cancer.  This is fantastic news.  Cindee Lame

## 2023-05-04 ENCOUNTER — Other Ambulatory Visit (HOSPITAL_COMMUNITY): Payer: Self-pay

## 2023-05-06 ENCOUNTER — Other Ambulatory Visit: Payer: Self-pay

## 2023-05-26 ENCOUNTER — Encounter: Payer: Self-pay | Admitting: *Deleted

## 2023-05-26 ENCOUNTER — Inpatient Hospital Stay: Payer: Medicare PPO | Attending: Hematology & Oncology

## 2023-05-26 ENCOUNTER — Inpatient Hospital Stay: Payer: Medicare PPO

## 2023-05-26 ENCOUNTER — Other Ambulatory Visit: Payer: Self-pay

## 2023-05-26 ENCOUNTER — Encounter: Payer: Self-pay | Admitting: Hematology & Oncology

## 2023-05-26 ENCOUNTER — Inpatient Hospital Stay: Payer: Medicare PPO | Admitting: Hematology & Oncology

## 2023-05-26 VITALS — BP 156/69 | HR 108 | Temp 98.4°F | Resp 19 | Ht 66.0 in | Wt 167.0 lb

## 2023-05-26 DIAGNOSIS — C50919 Malignant neoplasm of unspecified site of unspecified female breast: Secondary | ICD-10-CM

## 2023-05-26 DIAGNOSIS — I10 Essential (primary) hypertension: Secondary | ICD-10-CM

## 2023-05-26 DIAGNOSIS — Z79899 Other long term (current) drug therapy: Secondary | ICD-10-CM | POA: Diagnosis not present

## 2023-05-26 DIAGNOSIS — Z923 Personal history of irradiation: Secondary | ICD-10-CM | POA: Insufficient documentation

## 2023-05-26 DIAGNOSIS — Z5111 Encounter for antineoplastic chemotherapy: Secondary | ICD-10-CM | POA: Diagnosis not present

## 2023-05-26 DIAGNOSIS — Z170 Estrogen receptor positive status [ER+]: Secondary | ICD-10-CM | POA: Insufficient documentation

## 2023-05-26 DIAGNOSIS — Z7982 Long term (current) use of aspirin: Secondary | ICD-10-CM | POA: Diagnosis not present

## 2023-05-26 DIAGNOSIS — C50911 Malignant neoplasm of unspecified site of right female breast: Secondary | ICD-10-CM | POA: Diagnosis not present

## 2023-05-26 DIAGNOSIS — C7951 Secondary malignant neoplasm of bone: Secondary | ICD-10-CM | POA: Insufficient documentation

## 2023-05-26 DIAGNOSIS — C771 Secondary and unspecified malignant neoplasm of intrathoracic lymph nodes: Secondary | ICD-10-CM | POA: Diagnosis not present

## 2023-05-26 DIAGNOSIS — R42 Dizziness and giddiness: Secondary | ICD-10-CM | POA: Insufficient documentation

## 2023-05-26 DIAGNOSIS — Z9011 Acquired absence of right breast and nipple: Secondary | ICD-10-CM | POA: Insufficient documentation

## 2023-05-26 LAB — CBC WITH DIFFERENTIAL (CANCER CENTER ONLY)
Abs Immature Granulocytes: 0.03 10*3/uL (ref 0.00–0.07)
Basophils Absolute: 0 10*3/uL (ref 0.0–0.1)
Basophils Relative: 1 %
Eosinophils Absolute: 0 10*3/uL (ref 0.0–0.5)
Eosinophils Relative: 1 %
HCT: 30.9 % — ABNORMAL LOW (ref 36.0–46.0)
Hemoglobin: 10.9 g/dL — ABNORMAL LOW (ref 12.0–15.0)
Immature Granulocytes: 1 %
Lymphocytes Relative: 12 %
Lymphs Abs: 0.3 10*3/uL — ABNORMAL LOW (ref 0.7–4.0)
MCH: 40.2 pg — ABNORMAL HIGH (ref 26.0–34.0)
MCHC: 35.3 g/dL (ref 30.0–36.0)
MCV: 114 fL — ABNORMAL HIGH (ref 80.0–100.0)
Monocytes Absolute: 0.2 10*3/uL (ref 0.1–1.0)
Monocytes Relative: 10 %
Neutro Abs: 1.6 10*3/uL — ABNORMAL LOW (ref 1.7–7.7)
Neutrophils Relative %: 75 %
Platelet Count: 335 10*3/uL (ref 150–400)
RBC: 2.71 MIL/uL — ABNORMAL LOW (ref 3.87–5.11)
RDW: 14.2 % (ref 11.5–15.5)
Smear Review: NORMAL
WBC Count: 2.2 10*3/uL — ABNORMAL LOW (ref 4.0–10.5)
nRBC: 0 % (ref 0.0–0.2)

## 2023-05-26 LAB — CMP (CANCER CENTER ONLY)
ALT: 15 U/L (ref 0–44)
AST: 22 U/L (ref 15–41)
Albumin: 3.8 g/dL (ref 3.5–5.0)
Alkaline Phosphatase: 111 U/L (ref 38–126)
Anion gap: 10 (ref 5–15)
BUN: 18 mg/dL (ref 8–23)
CO2: 32 mmol/L (ref 22–32)
Calcium: 9.4 mg/dL (ref 8.9–10.3)
Chloride: 92 mmol/L — ABNORMAL LOW (ref 98–111)
Creatinine: 1.28 mg/dL — ABNORMAL HIGH (ref 0.44–1.00)
GFR, Estimated: 45 mL/min — ABNORMAL LOW (ref >60)
Glucose, Bld: 148 mg/dL — ABNORMAL HIGH (ref 70–99)
Potassium: 3.8 mmol/L (ref 3.5–5.1)
Sodium: 134 mmol/L — ABNORMAL LOW (ref 135–145)
Total Bilirubin: 0.7 mg/dL (ref 0.3–1.2)
Total Protein: 7.1 g/dL (ref 6.5–8.1)

## 2023-05-26 LAB — LACTATE DEHYDROGENASE: LDH: 249 U/L — ABNORMAL HIGH (ref 98–192)

## 2023-05-26 MED ORDER — BISOPROLOL-HYDROCHLOROTHIAZIDE 10-6.25 MG PO TABS
1.0000 | ORAL_TABLET | Freq: Every day | ORAL | 1 refills | Status: DC
Start: 2023-05-26 — End: 2024-02-23

## 2023-05-26 MED ORDER — SODIUM CHLORIDE 0.9 % IV SOLN: Freq: Once | INTRAVENOUS | Status: AC

## 2023-05-26 MED ORDER — ZOLEDRONIC ACID 4 MG/100ML IV SOLN
4.0000 mg | Freq: Once | INTRAVENOUS | Status: AC
  Administered 2023-05-26: 4 mg via INTRAVENOUS
  Filled 2023-05-26: qty 100

## 2023-05-26 MED ORDER — AMLODIPINE BESYLATE 2.5 MG PO TABS
ORAL_TABLET | ORAL | 1 refills | Status: DC
Start: 2023-05-26 — End: 2024-02-23

## 2023-05-26 MED ORDER — FULVESTRANT 250 MG/5ML IM SOSY
500.0000 mg | PREFILLED_SYRINGE | Freq: Once | INTRAMUSCULAR | Status: AC
  Administered 2023-05-26: 500 mg via INTRAMUSCULAR

## 2023-05-26 NOTE — Progress Notes (Signed)
Hematology and Oncology Follow Up Visit  RAWAN MISTER [id number] November 06, 1951 72 y.o. 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 daily (21/7) --start on 11/09/2022 Zometa 4 mg IV every 3 months -next dose on 07/2023 Radiation therapy to left hip -completed in 11/23/2022     Interim History:  Ms. Oppenheim is back for follow-up.  Thankfully, I think that we "dodged a bullet".  I thought with that when we last saw her, she had a tumor in the right breast.  I really thought that this was going to be cancer.  She subsequently underwent a right mastectomy.  This was done on 04/12/2023.  The pathology report ([id number]) showed only dense fibrosis and extensive calcification.  There is no evidence of cancer.  I am so happy about this.  She had been responding well to treatment.  Her CA 27.29 has been coming down.  The last time that we saw her, the CA 27.29 was down to 65.  She had a PET scan that was done back in late April.  The PET scan not show any evidence of obvious metastatic disease.  She has some sclerotic areas in her bones which indicate treatment effect.  Unfortunately, she had gets vertigo.  She subsequently had a fall a week ago.  She hit a table edge.  Thankfully, there is no gash in her forehead.  She has a large ecchymosis in the left temporal area.  She has a area of ecchymoses in the left flank.  She thinks that she may have broken a rib.  She is recovering from this.  She is going to the bathroom okay.  She is having no problems with nausea or vomiting.  She is having no problems with fever.  She has had no rashes.  She has little bit of leg swelling.  She needs to get back on her blood pressure medications.  I called in her Ziac and amlodipine.  Overall, I would say that her performance  status is probably ECOG 1.  .     Medications:  Current Outpatient Medications:    amLODipine (NORVASC) 2.5 MG tablet, TAKE 1 TABLET(2.5 MG) BY MOUTH DAILY, Disp: 90 tablet, Rfl: 1   aspirin 81 MG tablet, Take 81 mg by mouth daily., Disp: , Rfl:    bisoprolol-hydrochlorothiazide (ZIAC) 10-6.25 MG tablet, TAKE 1 TABLET BY MOUTH DAILY, Disp: 90 tablet, Rfl: 1   cholecalciferol (VITAMIN D) 1000 units tablet, Take 1,000 Units by mouth daily., Disp: , Rfl:    fulvestrant (FASLODEX) 250 MG/5ML injection, Inject into the muscle every 30 (thirty) days. One injection each buttock over 1-2 minutes. Warm prior to use., Disp: , Rfl:    hydrochlorothiazide (HYDRODIURIL) 25 MG tablet, Take 1 tablet (25 mg total) by mouth daily., Disp: 90 tablet, Rfl: 1   meclizine (ANTIVERT) 12.5 MG tablet, Take 1 tablet (12.5 mg total) by mouth 3 (three) times daily as needed for dizziness., Disp: 30 tablet, Rfl: 4   OLANZapine (ZYPREXA) 5 MG tablet, TAKE 1 TABLET(5 MG) BY MOUTH AT BEDTIME, Disp: 30 tablet, Rfl: 3   ondansetron (ZOFRAN) 8 MG tablet, TAKE 1 TABLET(8 MG) BY MOUTH EVERY 8 HOURS AS NEEDED FOR NAUSEA OR VOMITING, Disp: 30 tablet, Rfl: 2   potassium chloride SA (KLOR-CON M) 20 MEQ tablet, Take 1 tablet (20 mEq total) by mouth  2 (two) times daily., Disp: 60 tablet, Rfl: 2   ribociclib succ (KISQALI 600MG  DAILY DOSE) 200 MG Therapy Pack, Take 3 tablets (600 mg total) by mouth daily. Take for 21 days on, 7 days off, repeat every 28 days., Disp: 63 tablet, Rfl: 6   traMADol (ULTRAM) 50 MG tablet, Take 1 tablet (50 mg total) by mouth every 6 (six) hours as needed (mild pain)., Disp: 10 tablet, Rfl: 0   zoledronic acid (ZOMETA) 4 MG/5ML injection, Inject 4 mg into the vein every 3 (three) months., Disp: , Rfl:   Allergies: No Known Allergies  Past Medical History, Surgical history, Social history, and Family History were reviewed and updated.  Review of Systems: Review of Systems  Constitutional:  Positive for  fatigue.  HENT:  Negative.    Eyes: Negative.   Respiratory: Negative.    Cardiovascular: Negative.   Gastrointestinal:  Positive for constipation and nausea.  Endocrine: Negative.   Genitourinary: Negative.    Musculoskeletal:  Positive for arthralgias and myalgias.  Skin: Negative.   Neurological: Negative.   Hematological: Negative.   Psychiatric/Behavioral: Negative.      Physical Exam: Vital signs show temperature of 98 4.4.  Pulse 100.  Blood pressure 156/92.  Weight is 167 pounds.  Wt Readings from Last 3 Encounters:  04/12/23 170 lb (77.1 kg)  04/07/23 170 lb 4.8 oz (77.2 kg)  03/23/23 172 lb (78 kg)    Physical Exam Vitals reviewed.  Constitutional:      Comments: Left breast shows no masses, edema or erythema.  There is no left axillary adenopathy.  Her right chest wall shows a well-healing mastectomy.  There is no erythema or warmth.  There is no right axillary adenopathy.  Marland Kitchen  HENT:     Head: Normocephalic and atraumatic.     Comments: She has a large ecchymoses in the left temporal area. Eyes:     Pupils: Pupils are equal, round, and reactive to light.  Cardiovascular:     Rate and Rhythm: Normal rate and regular rhythm.     Heart sounds: Normal heart sounds.  Pulmonary:     Effort: Pulmonary effort is normal.     Breath sounds: Normal breath sounds.  Abdominal:     General: Bowel sounds are normal.     Palpations: Abdomen is soft.     Comments: In the left flank, there is a faint area of ecchymoses.  Musculoskeletal:        General: No tenderness or deformity. Normal range of motion.     Cervical back: Normal range of motion.  Lymphadenopathy:     Cervical: No cervical adenopathy.  Skin:    General: Skin is warm and dry.     Findings: No erythema or rash.  Neurological:     Mental Status: She is alert and oriented to person, place, and time.  Psychiatric:        Behavior: Behavior normal.        Thought Content: Thought content normal.         Judgment: Judgment normal.      Lab Results  Component Value Date   WBC 2.2 (L) 05/26/2023   HGB 10.9 (L) 05/26/2023   HCT 30.9 (L) 05/26/2023   MCV 114.0 (H) 05/26/2023   PLT 335 05/26/2023     Chemistry      Component Value Date/Time   NA 134 (L) 05/26/2023 1135   K 3.8 05/26/2023 1135   CL 92 (L) 05/26/2023 1135  CO2 32 05/26/2023 1135   BUN 18 05/26/2023 1135   CREATININE 1.28 (H) 05/26/2023 1135      Component Value Date/Time   CALCIUM 9.4 05/26/2023 1135   ALKPHOS 111 05/26/2023 1135   AST 22 05/26/2023 1135   ALT 15 05/26/2023 1135   BILITOT 0.7 05/26/2023 1135       Impression and Plan: Ms. Turi is a very nice 72 year old white female.  She has metastatic breast cancer.  We have her on antiestrogen therapy.  Her CA 27-29 has been coming down.  I am so glad that she had a mastectomy and that it did not show any evidence of malignancy.  I am very reassured that the antiestrogen therapy that she is on is working well.  I hate that she has this vertigo.  I think if she needs to take Antivert on a regular basis, I do not see a problem with this.  She will go ahead with her Faslodex today.  She will also have to get her Zometa today.  We will plan to get her back in another month.  Her last PET scans were done back in late April.  I do not think we need another one probably till July or August.    Josph Macho, MD 5/30/202412:41 PM

## 2023-05-26 NOTE — Patient Instructions (Signed)

## 2023-05-26 NOTE — Progress Notes (Signed)
Will continue on current treatment.   Oncology Nurse Navigator Documentation     05/26/2023   12:30 PM  Oncology Nurse Navigator Flowsheets  Navigator Follow Up Date: 06/22/2023  Navigator Follow Up Reason: Follow-up Appointment;Chemotherapy  Navigator Location [facility]  Navigator Encounter Type Appt/Treatment Plan Review  Patient Visit Type MedOnc  Treatment Phase Active Tx  Barriers/Navigation Needs No Barriers At This Time  Interventions None Required  Acuity Level 1-No Barriers  Support Groups/Services Friends and Family  Time Spent with Patient 15

## 2023-05-27 ENCOUNTER — Encounter: Payer: Self-pay | Admitting: *Deleted

## 2023-05-27 LAB — CANCER ANTIGEN 27.29: CA 27.29: 51.7 U/mL — ABNORMAL HIGH (ref 0.0–38.6)

## 2023-05-31 ENCOUNTER — Other Ambulatory Visit (HOSPITAL_COMMUNITY): Payer: Self-pay

## 2023-05-31 ENCOUNTER — Other Ambulatory Visit: Payer: Self-pay | Admitting: Hematology & Oncology

## 2023-06-01 ENCOUNTER — Other Ambulatory Visit (HOSPITAL_COMMUNITY): Payer: Self-pay

## 2023-06-01 ENCOUNTER — Encounter: Payer: Self-pay | Admitting: Hematology & Oncology

## 2023-06-01 ENCOUNTER — Other Ambulatory Visit: Payer: Self-pay

## 2023-06-01 ENCOUNTER — Other Ambulatory Visit: Payer: Self-pay | Admitting: Family Medicine

## 2023-06-01 DIAGNOSIS — I10 Essential (primary) hypertension: Secondary | ICD-10-CM

## 2023-06-01 MED ORDER — RIBOCICLIB SUCC (600 MG DOSE) 200 MG PO TBPK
600.0000 mg | ORAL_TABLET | Freq: Every day | ORAL | 6 refills | Status: DC
  Filled 2023-06-01: qty 63, 28d supply, fill #0
  Filled 2023-06-28: qty 63, 28d supply, fill #1
  Filled 2023-07-28: qty 63, 28d supply, fill #2

## 2023-06-03 ENCOUNTER — Other Ambulatory Visit (HOSPITAL_COMMUNITY): Payer: Self-pay

## 2023-06-04 ENCOUNTER — Other Ambulatory Visit: Payer: Self-pay | Admitting: Hematology & Oncology

## 2023-06-06 ENCOUNTER — Encounter: Payer: Self-pay | Admitting: Hematology & Oncology

## 2023-06-20 ENCOUNTER — Other Ambulatory Visit: Payer: Self-pay | Admitting: Hematology & Oncology

## 2023-06-22 ENCOUNTER — Encounter: Payer: Self-pay | Admitting: Hematology & Oncology

## 2023-06-22 ENCOUNTER — Inpatient Hospital Stay: Payer: Medicare PPO

## 2023-06-22 ENCOUNTER — Inpatient Hospital Stay: Payer: Medicare PPO | Attending: Hematology & Oncology

## 2023-06-22 ENCOUNTER — Inpatient Hospital Stay: Payer: Medicare PPO | Admitting: Hematology & Oncology

## 2023-06-22 VITALS — BP 107/64 | HR 74 | Temp 98.1°F | Resp 20 | Ht 66.0 in | Wt 158.0 lb

## 2023-06-22 DIAGNOSIS — I10 Essential (primary) hypertension: Secondary | ICD-10-CM

## 2023-06-22 DIAGNOSIS — Z170 Estrogen receptor positive status [ER+]: Secondary | ICD-10-CM | POA: Diagnosis not present

## 2023-06-22 DIAGNOSIS — C50919 Malignant neoplasm of unspecified site of unspecified female breast: Secondary | ICD-10-CM

## 2023-06-22 DIAGNOSIS — Z5111 Encounter for antineoplastic chemotherapy: Secondary | ICD-10-CM | POA: Insufficient documentation

## 2023-06-22 DIAGNOSIS — C50911 Malignant neoplasm of unspecified site of right female breast: Secondary | ICD-10-CM | POA: Insufficient documentation

## 2023-06-22 DIAGNOSIS — C7951 Secondary malignant neoplasm of bone: Secondary | ICD-10-CM | POA: Diagnosis not present

## 2023-06-22 DIAGNOSIS — Z7982 Long term (current) use of aspirin: Secondary | ICD-10-CM | POA: Insufficient documentation

## 2023-06-22 DIAGNOSIS — C771 Secondary and unspecified malignant neoplasm of intrathoracic lymph nodes: Secondary | ICD-10-CM | POA: Insufficient documentation

## 2023-06-22 DIAGNOSIS — Z923 Personal history of irradiation: Secondary | ICD-10-CM | POA: Insufficient documentation

## 2023-06-22 DIAGNOSIS — Z79899 Other long term (current) drug therapy: Secondary | ICD-10-CM | POA: Insufficient documentation

## 2023-06-22 DIAGNOSIS — Z79818 Long term (current) use of other agents affecting estrogen receptors and estrogen levels: Secondary | ICD-10-CM | POA: Diagnosis not present

## 2023-06-22 LAB — CMP (CANCER CENTER ONLY)
ALT: 14 U/L (ref 0–44)
AST: 22 U/L (ref 15–41)
Albumin: 4 g/dL (ref 3.5–5.0)
Alkaline Phosphatase: 88 U/L (ref 38–126)
Anion gap: 9 (ref 5–15)
BUN: 18 mg/dL (ref 8–23)
CO2: 32 mmol/L (ref 22–32)
Calcium: 9.7 mg/dL (ref 8.9–10.3)
Chloride: 95 mmol/L — ABNORMAL LOW (ref 98–111)
Creatinine: 1.35 mg/dL — ABNORMAL HIGH (ref 0.44–1.00)
GFR, Estimated: 42 mL/min — ABNORMAL LOW (ref >60)
Glucose, Bld: 119 mg/dL — ABNORMAL HIGH (ref 70–99)
Potassium: 3.5 mmol/L (ref 3.5–5.1)
Sodium: 136 mmol/L (ref 135–145)
Total Bilirubin: 0.7 mg/dL (ref 0.3–1.2)
Total Protein: 6.7 g/dL (ref 6.5–8.1)

## 2023-06-22 LAB — CBC WITH DIFFERENTIAL (CANCER CENTER ONLY)
Abs Immature Granulocytes: 0.03 10*3/uL (ref 0.00–0.07)
Basophils Absolute: 0 10*3/uL (ref 0.0–0.1)
Basophils Relative: 1 %
Eosinophils Absolute: 0 10*3/uL (ref 0.0–0.5)
Eosinophils Relative: 1 %
HCT: 31.5 % — ABNORMAL LOW (ref 36.0–46.0)
Hemoglobin: 11.3 g/dL — ABNORMAL LOW (ref 12.0–15.0)
Immature Granulocytes: 1 %
Lymphocytes Relative: 20 %
Lymphs Abs: 0.5 10*3/uL — ABNORMAL LOW (ref 0.7–4.0)
MCH: 40.6 pg — ABNORMAL HIGH (ref 26.0–34.0)
MCHC: 35.9 g/dL (ref 30.0–36.0)
MCV: 113.3 fL — ABNORMAL HIGH (ref 80.0–100.0)
Monocytes Absolute: 0.3 10*3/uL (ref 0.1–1.0)
Monocytes Relative: 10 %
Neutro Abs: 1.7 10*3/uL (ref 1.7–7.7)
Neutrophils Relative %: 67 %
Platelet Count: 248 10*3/uL (ref 150–400)
RBC: 2.78 MIL/uL — ABNORMAL LOW (ref 3.87–5.11)
RDW: 14.3 % (ref 11.5–15.5)
Smear Review: NORMAL
WBC Count: 2.5 10*3/uL — ABNORMAL LOW (ref 4.0–10.5)
nRBC: 0 % (ref 0.0–0.2)

## 2023-06-22 MED ORDER — FULVESTRANT 250 MG/5ML IM SOSY
500.0000 mg | PREFILLED_SYRINGE | Freq: Once | INTRAMUSCULAR | Status: AC
  Administered 2023-06-22: 500 mg via INTRAMUSCULAR

## 2023-06-22 NOTE — Patient Instructions (Signed)

## 2023-06-22 NOTE — Progress Notes (Signed)
Hematology and Oncology Follow Up Visit  Tracy Thompson [id number] Nov 28, 1951 72 y.o. 06/22/2023   Principle Diagnosis:  Metastatic breast cancer-bone metastasis/hilar lymph node metastasis- ER+/PR+/HER-2 (2+) -- PIK3CA (+)  Current Therapy:   Faslodex 500 mg IM monthly -start on 11/09/2022 Ribociclib 600 mg p.o. daily (21/7) --start on 11/09/2022 Zometa 4 mg IV every 3 months -next dose on 07/2023 Radiation therapy to left hip -completed in 11/23/2022     Interim History:  [name] is back for follow-up.  She is doing quite well.  The problem is that she is a very difficult IV stick.  As such, we talked about a Port-A-Cath.  She had she had 1 quite a while back when she had her chemotherapy.  She will be agreeable to another 1.  I told her that Port-A-Cath now are not as big.  I told her that Radiology puts it in for [place].  I think that this would really help her out.  Otherwise, she is feeling well.  She is happy that she had the mastectomy.  Thankfully, the mastectomy did not show any evidence of breast cancer.  Her last CA 27.29 was down to 51.  Her last PET scan was done back in April.  She has had no change in bowel or bladder habits.  She has had no cough or shortness of breath.  There is been no rashes.  She has had no bleeding.  She has had no falling.  Overall, I would say that her performance status probably ECOG 1. .     Medications:  Current Outpatient Medications:    amLODipine (NORVASC) 2.5 MG tablet, Take 1 pill a day., Disp: 90 tablet, Rfl: 1   aspirin 81 MG tablet, Take 81 mg by mouth daily., Disp: , Rfl:    bisoprolol-hydrochlorothiazide (ZIAC) 10-6.25 MG tablet, Take 1 tablet by mouth daily., Disp: 90 tablet, Rfl: 1   cholecalciferol (VITAMIN D) 1000 units tablet, Take 1,000 Units by mouth daily., Disp: , Rfl:    fulvestrant (FASLODEX) 250 MG/5ML injection, Inject into the muscle every 30 (thirty) days. One injection each buttock over 1-2 minutes. Warm prior to  use., Disp: , Rfl:    hydrochlorothiazide (HYDRODIURIL) 25 MG tablet, TAKE 1 TABLET(25 MG) BY MOUTH DAILY, Disp: 90 tablet, Rfl: 1   OLANZapine (ZYPREXA) 5 MG tablet, TAKE 1 TABLET(5 MG) BY MOUTH AT BEDTIME, Disp: 30 tablet, Rfl: 3   ondansetron (ZOFRAN) 8 MG tablet, TAKE 1 TABLET(8 MG) BY MOUTH EVERY 8 HOURS AS NEEDED FOR NAUSEA OR VOMITING, Disp: 30 tablet, Rfl: 2   potassium chloride SA (KLOR-CON M) 20 MEQ tablet, Take 1 tablet (20 mEq total) by mouth 2 (two) times daily., Disp: 60 tablet, Rfl: 2   ribociclib succ (KISQALI 600MG  DAILY DOSE) 200 MG Therapy Pack, Take 3 tablets (600 mg total) by mouth daily. Take for 21 days on, 7 days off, repeat every 28 days., Disp: 63 tablet, Rfl: 6   zoledronic acid (ZOMETA) 4 MG/5ML injection, Inject 4 mg into the vein every 3 (three) months., Disp: , Rfl:    meclizine (ANTIVERT) 12.5 MG tablet, Take 1 tablet (12.5 mg total) by mouth 3 (three) times daily as needed for dizziness. (Patient not taking: Reported on 06/22/2023), Disp: 30 tablet, Rfl: 4   traMADol (ULTRAM) 50 MG tablet, Take 1 tablet (50 mg total) by mouth every 6 (six) hours as needed (mild pain). (Patient not taking: Reported on 06/22/2023), Disp: 10 tablet, Rfl: 0  Allergies: No Known  Allergies  Past Medical History, Surgical history, Social history, and Family History were reviewed and updated.  Review of Systems: Review of Systems  Constitutional:  Positive for fatigue.  HENT:  Negative.    Eyes: Negative.   Respiratory: Negative.    Cardiovascular: Negative.   Gastrointestinal:  Positive for constipation and nausea.  Endocrine: Negative.   Genitourinary: Negative.    Musculoskeletal:  Positive for arthralgias and myalgias.  Skin: Negative.   Neurological: Negative.   Hematological: Negative.   Psychiatric/Behavioral: Negative.      Physical Exam: Vital signs show temperature of 98 4.4.  Pulse 100.  Blood pressure 156/92.  Weight is 167 pounds.  Wt Readings from Last 3  Encounters:  06/22/23 158 lb (71.7 kg)  05/26/23 167 lb (75.8 kg)  04/12/23 170 lb (77.1 kg)    Physical Exam Vitals reviewed.  Constitutional:      Comments: Left breast shows no masses, edema or erythema.  There is no left axillary adenopathy.  Her right chest wall shows a well-healing mastectomy.  There is no erythema or warmth.  There is no right axillary adenopathy.  Marland Kitchen  HENT:     Head: Normocephalic and atraumatic.     Comments: She has a large ecchymoses in the left temporal area. Eyes:     Pupils: Pupils are equal, round, and reactive to light.  Cardiovascular:     Rate and Rhythm: Normal rate and regular rhythm.     Heart sounds: Normal heart sounds.  Pulmonary:     Effort: Pulmonary effort is normal.     Breath sounds: Normal breath sounds.  Abdominal:     General: Bowel sounds are normal.     Palpations: Abdomen is soft.     Comments: In the left flank, there is a faint area of ecchymoses.  Musculoskeletal:        General: No tenderness or deformity. Normal range of motion.     Cervical back: Normal range of motion.  Lymphadenopathy:     Cervical: No cervical adenopathy.  Skin:    General: Skin is warm and dry.     Findings: No erythema or rash.  Neurological:     Mental Status: She is alert and oriented to person, place, and time.  Psychiatric:        Behavior: Behavior normal.        Thought Content: Thought content normal.        Judgment: Judgment normal.      Lab Results  Component Value Date   WBC 2.5 (L) 06/22/2023   HGB 11.3 (L) 06/22/2023   HCT 31.5 (L) 06/22/2023   MCV 113.3 (H) 06/22/2023   PLT 248 06/22/2023     Chemistry      Component Value Date/Time   NA 134 (L) 05/26/2023 1135   K 3.8 05/26/2023 1135   CL 92 (L) 05/26/2023 1135   CO2 32 05/26/2023 1135   BUN 18 05/26/2023 1135   CREATININE 1.28 (H) 05/26/2023 1135      Component Value Date/Time   CALCIUM 9.4 05/26/2023 1135   ALKPHOS 111 05/26/2023 1135   AST 22 05/26/2023 1135    ALT 15 05/26/2023 1135   BILITOT 0.7 05/26/2023 1135       Impression and Plan: Ms. Culton is a very nice 72 year old white female.  She has metastatic breast cancer.  We have her on antiestrogen therapy.  Her CA 27-29 has been coming down.  So far, everything looks pretty good.  We will see what the CA 27.29 is.  I think that as long as the CA 27.29 is coming down, we probably can hold off on a PET scan.  Hopefully we do not have doing until August.  I am so happy that she had the mastectomy.  She currently is feeling well after having this done.  We will plan to get her back in 1 month.   Josph Macho, MD 6/26/20244:09 PM

## 2023-06-22 NOTE — Addendum Note (Signed)
Addended by: Josph Macho on: 06/22/2023 04:38 PM   Modules accepted: Orders

## 2023-06-23 ENCOUNTER — Encounter: Payer: Self-pay | Admitting: *Deleted

## 2023-06-23 LAB — CANCER ANTIGEN 27.29: CA 27.29: 56.4 U/mL — ABNORMAL HIGH (ref 0.0–38.6)

## 2023-06-23 NOTE — Progress Notes (Signed)
Patient is doing well post mastectomy. She will continue on current treatment. Her IV access is poor and she will likely need reliable IV access in the future. Port will be placed on 07/08/2023.  Oncology Nurse Navigator Documentation     06/23/2023    2:00 PM  Oncology Nurse Navigator Flowsheets  Navigator Follow Up Date: 07/20/2023  Navigator Follow Up Reason: Follow-up Appointment;Chemotherapy  Navigator Location [facility]  Navigator Encounter Type Appt/Treatment Plan Review  Patient Visit Type MedOnc  Treatment Phase Active Tx  Barriers/Navigation Needs No Barriers At This Time  Interventions None Required  Acuity Level 1-No Barriers  Support Groups/Services Friends and Family  Time Spent with Patient 15

## 2023-06-24 ENCOUNTER — Encounter: Payer: Self-pay | Admitting: Hematology & Oncology

## 2023-06-28 ENCOUNTER — Other Ambulatory Visit (HOSPITAL_COMMUNITY): Payer: Self-pay

## 2023-07-04 ENCOUNTER — Other Ambulatory Visit (HOSPITAL_COMMUNITY): Payer: Self-pay

## 2023-07-04 ENCOUNTER — Telehealth: Payer: Self-pay | Admitting: *Deleted

## 2023-07-04 NOTE — Telephone Encounter (Signed)
Per scheduling message Tracy Thompson - Called patient and she is refusing to have a nutrition follow up appointment by phone.

## 2023-07-07 ENCOUNTER — Other Ambulatory Visit (HOSPITAL_COMMUNITY): Payer: Self-pay | Admitting: Student

## 2023-07-07 NOTE — H&P (Signed)
Chief Complaint: Patient was seen in consultation today for metastatic breast cancer.  Referring Physician(s): Ennever,Peter R  Supervising Physician: Roanna Banning  Patient Status: [facility] - Out-pt  History of Present Illness: Tracy Thompson is a 72 y.o. female with a past medical history significant for factor V leiden mutation and breast cancer s/p right mastectomy who presents today for port placement. Tracy Thompson was originally diagnosed with breast cancer in 1994 and underwent chemotherapy, radiation and lumpectomy. She had a port placed on the left side during that time which was removed after her treatments were completed and she was found to be in remission. She has been followed by oncology and underwent PET scan 10/11/22 which showed:  1. Hypermetabolic right hilar in mediastinal lymphadenopathy consistent with metastatic disease. 2. Widespread osseous metastatic involvement. 3. No evidence for hypermetabolic metastases in the abdomen or pelvis. 4.  Aortic Atherosclerois (ICD10-170.0) 5. Small pericardial effusion  She underwent lymph node biopsy 11/10/22 by pulmonology and pathology showed metastatic adenocarcinoma consistent with a breast primary. She was started on hormone therapy but later found to have progression of a right breast lesion and subsequently underwent right mastectomy 04/12/23. She presents to IR today for port placement due to difficulty obtaining IV access for intermittent infusions and lab monitoring.  Past Medical History:  Diagnosis Date   Breast cancer (HCC) 1994   s/p chemo 8/94-1/95, radiation 8/94-11/94   Breast cancer metastasized to bone (HCC) 09/29/2022   Cataracts, bilateral    Dyspnea    Factor 5 Leiden mutation, heterozygous [facility])    also has Factor 8 problems   Headache    younger 53   History of radiation therapy    Lumbar Spine, Left hip- 11/03/22-11/17/22- Dr. 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 (p.[id number])     Past Surgical History:  Procedure Laterality Date   BREAST LUMPECTOMY Right 1994   w/ radiation and chemotherapy following lumpectomy   BRONCHIAL BIOPSY  11/10/2022   Procedure: BRONCHIAL BIOPSIES;  Surgeon: Lupita Leash, MD;  Location: [facility] ENDOSCOPY;  Service: Cardiopulmonary;;   BRONCHIAL BRUSHINGS  11/10/2022   Procedure: BRONCHIAL BRUSHINGS;  Surgeon: Lupita Leash, MD;  Location[facility] ENDOSCOPY;  Service: Cardiopulmonary;;   BRONCHIAL WASHINGS  11/10/2022   Procedure: BRONCHIAL WASHINGS;  Surgeon: Lupita Leash, MD;  Location: [facility] ENDOSCOPY;  Service: Cardiopulmonary;;   CHOLECYSTECTOMY  06/20/2002   Laparoscopic cholestectomy   TOTAL MASTECTOMY Right 04/12/2023   Procedure: RIGHT TOTAL MASTECTOMY;  Surgeon: Emelia Loron, MD;  Location: [facility] OR;  Service: General;  Laterality: Right;   varicose veins Bilateral 2013   laser surgery   Dr. Jimmie Molly   VIDEO BRONCHOSCOPY WITH ENDOBRONCHIAL ULTRASOUND N/A 11/10/2022   Procedure: VIDEO BRONCHOSCOPY WITH ENDOBRONCHIAL ULTRASOUND;  Surgeon: Lupita Leash, MD;  Location: [facility] ENDOSCOPY;  Service: Cardiopulmonary;  Laterality: N/A;    Allergies: Patient has no known allergies.  Medications: Prior to Admission medications   Medication Sig Start Date End Date Taking? Authorizing Provider  amLODipine (NORVASC) 2.5 MG tablet Take 1 pill a day. 05/26/23   Josph Macho, MD  aspirin 81 MG tablet Take 81 mg by mouth daily.    [provider]  bisoprolol-hydrochlorothiazide (ZIAC) 10-6.25 MG tablet Take 1 tablet by mouth daily. 05/26/23   Josph Macho, MD  cholecalciferol (VITAMIN D) 1000 units tablet Take 1,000 Units by mouth daily.    [provider]  hydrochlorothiazide (HYDRODIURIL) 25 MG tablet TAKE 1 TABLET(25 MG) BY MOUTH DAILY 06/01/23   Zola Button, Grayling Congress, DO   meclizine (ANTIVERT) 12.5 MG tablet Take 1 tablet (12.5 mg total) by mouth 3 (three) times daily as needed for dizziness. Patient not taking: Reported on 06/22/2023 09/21/22   Zola Button, Myrene Buddy R, DO  OLANZapine (ZYPREXA) 5 MG tablet TAKE 1 TABLET(5 MG) BY MOUTH AT BEDTIME 06/06/23   Ennever, Rose Phi, MD  ondansetron (ZOFRAN) 8 MG tablet TAKE 1 TABLET(8 MG) BY MOUTH EVERY 8 HOURS AS NEEDED FOR NAUSEA OR VOMITING 06/20/23   Josph Macho, MD  potassium chloride SA (KLOR-CON M) 20 MEQ tablet Take 1 tablet (20 mEq total) by mouth 2 (two) times daily. 03/16/23   Donato Schultz, DO  ribociclib succ [facility] 600MG  DAILY DOSE) 200 MG Therapy Pack Take 3 tablets (600 mg total) by mouth daily. Take for 21 days on, 7 days off, repeat every 28 days. 06/01/23   Josph Macho, MD  traMADol (ULTRAM) 50 MG tablet Take 1 tablet (50 mg total) by mouth every 6 (six) hours as needed (mild pain). Patient not taking: Reported on 06/22/2023 04/13/23   Emelia Loron, MD  zoledronic acid (ZOMETA) 4 MG/5ML injection Inject 4 mg into the vein every 3 (three) months.    [provider]     Family History  Problem Relation Age of Onset   Colon cancer Mother 42 - 68   Stroke Mother    Hypertension Mother    Hyperlipidemia Mother    Hepatitis Mother        hep A   COPD Mother    Dementia Mother    Bladder Cancer Mother 88   Hypertension Father    Prostate cancer Father    Diabetes Father    Stroke Father    Pulmonary embolism Father    Deep vein thrombosis Father        PE   Schizophrenia Brother    Mental illness Brother    COPD Brother    Hepatitis C Brother    Liver disease Other     Social History   Socioeconomic History   Marital status: Married    Spouse name: KAYONA FOOR   Number of children: Not on file   Years of education: Not on file   Highest education level: Not on file  Occupational History   Occupation: retired Magazine features editor: RETIRED  Tobacco Use    Smoking status: Never   Smokeless tobacco: Never  Vaping Use   Vaping status: Never Used  Substance and Sexual Activity   Alcohol use: No   Drug use: No   Sexual activity: Yes    Partners: Male  Other Topics Concern   Not on file  Social History Narrative   Exercise--- walking   Social Determinants of Health   Financial Resource Strain: Low Risk  (07/20/2021)   Overall Financial Resource Strain (CARDIA)    Difficulty of Paying Living Expenses: Not hard at all  Food Insecurity: No Food Insecurity (04/12/2023)   Hunger Vital Sign    Worried About Running Out of Food in the Last Year: Never true    Ran Out of Food in the Last Year: Never true  Transportation Needs: No Transportation Needs (04/12/2023)   PRAPARE - Administrator, Civil Service (Medical):  No    Lack of Transportation (Non-Medical): No  Physical Activity: Sufficiently Active (07/20/2021)   Exercise Vital Sign    Days of Exercise per Week: 7 days    Minutes of Exercise per Session: 30 min  Stress: No Stress Concern Present (07/20/2021)   [employer] of Occupational Health - Occupational Stress Questionnaire    Feeling of Stress : Not at all  Social Connections: Moderately Isolated (07/20/2021)   Social Connection and Isolation Panel [NHANES]    Frequency of Communication with Friends and Family: More than three times a week    Frequency of Social Gatherings with Friends and Family: More than three times a week    Attends Religious Services: Never    Database administrator or Organizations: No    Attends Banker Meetings: Never    Marital Status: Married     Review of Systems: A 12 point ROS discussed and pertinent positives are indicated in the HPI above.  All other systems are negative.  Review of Systems  Constitutional:  Negative for chills and fever.  Respiratory:  Negative for cough and shortness of breath.   Cardiovascular:  Negative for chest pain.  Gastrointestinal:   Negative for abdominal pain, diarrhea, nausea and vomiting.  Musculoskeletal:  Negative for back pain.  Neurological:  Negative for dizziness and headaches.    Vital Signs: BP 113/66 (BP Location: Left Arm)   Pulse 74   Temp 98 F (36.7 C) (Oral)   Resp 16   SpO2 95%   Physical Exam Vitals reviewed.  Constitutional:      General: She is not in acute distress. HENT:     Head: Normocephalic.     Mouth/Throat:     Mouth: Mucous membranes are moist.     Pharynx: Oropharynx is clear. No oropharyngeal exudate or posterior oropharyngeal erythema.  Cardiovascular:     Rate and Rhythm: Normal rate and regular rhythm.  Pulmonary:     Effort: Pulmonary effort is normal.     Breath sounds: Normal breath sounds.  Abdominal:     General: There is no distension.     Palpations: Abdomen is soft.     Tenderness: There is no abdominal tenderness.  Musculoskeletal:        General: Swelling (RUE - history of lymphedema per patient) present.  Skin:    General: Skin is warm and dry.  Neurological:     Mental Status: She is alert and oriented to person, place, and time.  Psychiatric:        Mood and Affect: Mood normal.        Thought Content: Thought content normal.        Judgment: Judgment normal.      MD Evaluation Airway: WNL Heart: WNL Abdomen: WNL Chest/ Lungs: WNL ASA  Classification: 3 Mallampati/Airway Score: Two   Imaging: No results found.  Labs:  CBC: Recent Labs    03/23/23 1225 04/07/23 1023 05/26/23 1135 06/22/23 1539  WBC 3.1* 2.3* 2.2* 2.5*  HGB 12.4 12.5 10.9* 11.3*  HCT 34.5* 34.2* 30.9* 31.5*  PLT 272 192 335 248    COAGS: No results for input(s): "INR", "APTT" in the last 8760 hours.  BMP: Recent Labs    03/23/23 1225 04/07/23 1023 05/26/23 1135 06/22/23 1539  NA 137 137 134* 136  K 3.7 3.2* 3.8 3.5  CL 100 97* 92* 95*  CO2 27 28 32 32  GLUCOSE 111* 110* 148* 119*  BUN 27* 16 18  18  CALCIUM 9.0 9.2 9.4 9.7  CREATININE 1.27* 1.26*  1.28* 1.35*  GFRNONAA 45* 46* 45* 42*    LIVER FUNCTION TESTS: Recent Labs    03/15/23 1302 03/23/23 1225 05/26/23 1135 06/22/23 1539  BILITOT 0.6 0.7 0.7 0.7  AST 16 18 22 22   ALT 8 10 15 14   ALKPHOS 89 118 111 88  PROT 6.7 6.5 7.1 6.7  ALBUMIN 3.9 3.9 3.8 4.0    TUMOR MARKERS: No results for input(s): "AFPTM", "CEA", "CA199", "CHROMGRNA" in the last 8760 hours.  Assessment and Plan:  72 y/o F with history of metastatic breast cancer s/p chemotherapy, radiation, hormone therapy and right mastectomy (04/12/23) who presents today for port placement due to difficulty obtaining IV access for intermittent infusions/lab draws.  Risks and benefits of image-guided Port-a-catheter placement were discussed with the patient including, but not limited to bleeding, infection, pneumothorax, or fibrin sheath development and need for additional procedures.  All of the patient's questions were answered, patient is agreeable to proceed.  Consent signed and in chart.  Thank you for this interesting consult.  I greatly enjoyed meeting ZYRA PARRILLO and look forward to participating in their care.  A copy of this report was sent to the requesting provider on this date.  Electronically Signed: Villa Herb, PA-C 07/07/2023, 8:22 AM   I spent a total of 30 Minutes  in face to face in clinical consultation, greater than 50% of which was counseling/coordinating care for breast cancer.

## 2023-07-08 ENCOUNTER — Ambulatory Visit (HOSPITAL_COMMUNITY)
Admission: RE | Admit: 2023-07-08 | Discharge: 2023-07-08 | Disposition: A | Discharge status: Home / Self Care | Payer: Medicare PPO | Source: Ambulatory Visit | Attending: Hematology & Oncology | Admitting: Hematology & Oncology

## 2023-07-08 ENCOUNTER — Other Ambulatory Visit: Payer: Self-pay | Admitting: Hematology & Oncology

## 2023-07-08 ENCOUNTER — Ambulatory Visit (HOSPITAL_COMMUNITY)
Admission: RE | Admit: 2023-07-08 | Discharge: 2023-07-08 | Disposition: A | Discharge status: Home / Self Care | Payer: Medicare PPO | Source: Ambulatory Visit

## 2023-07-08 ENCOUNTER — Encounter (HOSPITAL_COMMUNITY): Payer: Self-pay

## 2023-07-08 DIAGNOSIS — Z452 Encounter for adjustment and management of vascular access device: Secondary | ICD-10-CM | POA: Diagnosis not present

## 2023-07-08 DIAGNOSIS — I871 Compression of vein: Secondary | ICD-10-CM | POA: Diagnosis not present

## 2023-07-08 DIAGNOSIS — C50919 Malignant neoplasm of unspecified site of unspecified female breast: Secondary | ICD-10-CM

## 2023-07-08 DIAGNOSIS — C50911 Malignant neoplasm of unspecified site of right female breast: Secondary | ICD-10-CM | POA: Diagnosis not present

## 2023-07-08 DIAGNOSIS — Z9221 Personal history of antineoplastic chemotherapy: Secondary | ICD-10-CM | POA: Diagnosis not present

## 2023-07-08 DIAGNOSIS — Z923 Personal history of irradiation: Secondary | ICD-10-CM | POA: Diagnosis not present

## 2023-07-08 DIAGNOSIS — Z9011 Acquired absence of right breast and nipple: Secondary | ICD-10-CM | POA: Insufficient documentation

## 2023-07-08 DIAGNOSIS — D6851 Activated protein C resistance: Secondary | ICD-10-CM | POA: Diagnosis not present

## 2023-07-08 DIAGNOSIS — C7951 Secondary malignant neoplasm of bone: Secondary | ICD-10-CM | POA: Insufficient documentation

## 2023-07-08 HISTORY — PX: IR IMAGING GUIDED PORT INSERTION: IMG5740

## 2023-07-08 HISTORY — PX: IR VENO/JUGULAR LEFT: IMG2274

## 2023-07-08 HISTORY — PX: IR VENO/EXT/UNI LEFT: IMG675

## 2023-07-08 HISTORY — PX: IR FLUORO GUIDE CV LINE LEFT: IMG2282

## 2023-07-08 HISTORY — PX: IR US GUIDE VASC ACCESS LEFT: IMG2389

## 2023-07-08 MED ORDER — MIDAZOLAM HCL 2 MG/2ML IJ SOLN
INTRAMUSCULAR | Status: AC
  Filled 2023-07-08: qty 2

## 2023-07-08 MED ORDER — LIDOCAINE-EPINEPHRINE 1 %-1:100000 IJ SOLN
INTRAMUSCULAR | Status: AC
  Filled 2023-07-08: qty 1

## 2023-07-08 MED ORDER — FENTANYL CITRATE (PF) 100 MCG/2ML IJ SOLN
INTRAMUSCULAR | Status: AC | PRN
  Administered 2023-07-08 (×2): 50 ug via INTRAVENOUS

## 2023-07-08 MED ORDER — HEPARIN SOD (PORK) LOCK FLUSH 100 UNIT/ML IV SOLN
500.0000 [IU] | Freq: Once | INTRAVENOUS | Status: AC
  Administered 2023-07-08: 500 [IU] via INTRAVENOUS

## 2023-07-08 MED ORDER — IOHEXOL 300 MG/ML  SOLN
50.0000 mL | Freq: Once | INTRAMUSCULAR | Status: AC | PRN
  Administered 2023-07-08: 20 mL via INTRAVENOUS

## 2023-07-08 MED ORDER — HEPARIN SOD (PORK) LOCK FLUSH 100 UNIT/ML IV SOLN
INTRAVENOUS | Status: AC
  Filled 2023-07-08: qty 5

## 2023-07-08 MED ORDER — MIDAZOLAM HCL 2 MG/2ML IJ SOLN
INTRAMUSCULAR | Status: AC | PRN
  Administered 2023-07-08 (×2): 1 mg via INTRAVENOUS

## 2023-07-08 MED ORDER — SODIUM CHLORIDE 0.9 % IV SOLN: INTRAVENOUS | Status: DC

## 2023-07-08 MED ORDER — FENTANYL CITRATE (PF) 100 MCG/2ML IJ SOLN
INTRAMUSCULAR | Status: AC
  Filled 2023-07-08: qty 2

## 2023-07-08 MED ORDER — LIDOCAINE-EPINEPHRINE 1 %-1:100000 IJ SOLN
20.0000 mL | Freq: Once | INTRAMUSCULAR | Status: AC
  Administered 2023-07-08: 20 mL via INTRADERMAL

## 2023-07-08 MED ORDER — IOHEXOL 300 MG/ML  SOLN
50.0000 mL | Freq: Once | INTRAMUSCULAR | Status: AC | PRN
  Administered 2023-07-08: 15 mL via INTRAVENOUS

## 2023-07-08 NOTE — Discharge Instructions (Addendum)
Please call Interventional Radiology clinic [phone] with any questions or concerns.  Keep your dressing clean and dry; you may shower tomorrow. Cover your dressing with a saran wrap.   Moderate Conscious Sedation, Adult, Care After This sheet gives you information about how to care for yourself after your procedure. Your health care provider may also give you more specific instructions. If you have problems or questions, contact your health care provider. What can I expect after the procedure? After the procedure, it is common to have: Sleepiness for several hours. Impaired judgment for several hours. Difficulty with balance. Vomiting if you eat too soon. Follow these instructions at home: For the time period you were told by your health care provider: Rest. Do not participate in activities where you could fall or become injured. Do not drive or use machinery. Do not drink alcohol. Do not take sleeping pills or medicines that cause drowsiness. Do not make important decisions or sign legal documents. Do not take care of children on your own.        Eating and drinking Follow the diet recommended by your health care provider. Drink enough fluid to keep your urine pale yellow. If you vomit: Drink water, juice, or soup when you can drink without vomiting. Make sure you have little or no nausea before eating solid foods.    General instructions Take over-the-counter and prescription medicines only as told by your health care provider. Have a responsible adult stay with you for the time you are told. It is important to have someone help care for you until you are awake and alert. Do not smoke. Keep all follow-up visits as told by your health care provider. This is important. Contact a health care provider if: You are still sleepy or having trouble with balance after 24 hours. You feel light-headed. You keep feeling nauseous or you keep vomiting. You develop a rash. You have  a fever. You have redness or swelling around the IV site. Get help right away if: You have trouble breathing. You have new-onset confusion at home. Summary After the procedure, it is common to feel sleepy, have impaired judgment, or feel nauseous if you eat too soon. Rest after you get home. Know the things you should not do after the procedure. Follow the diet recommended by your health care provider and drink enough fluid to keep your urine pale yellow. Get help right away if you have trouble breathing or new-onset confusion at home. This information is not intended to replace advice given to you by your health care provider. Make sure you discuss any questions you have with your health care provider. Document Revised: 04/11/2020 Document Reviewed: 11/08/2019 Elsevier Patient Education  2021 [employer].

## 2023-07-08 NOTE — Sedation Documentation (Signed)
Procedure changed to a tunneled CVC

## 2023-07-08 NOTE — Procedures (Signed)
Vascular and Interventional Radiology Procedure Note  Patient: Tracy Thompson DOB: [birth year] Medical Record Number: [id number] Note Date/Time: 07/08/23 3:25 PM   Performing Physician: Roanna Banning, MD Assistant(s): None  Diagnosis: Breast cancer  Procedure:  *Aborted* PORT PLACEMENT LEFT UPPER EXTREMITY and JUGULAR VENOGRAMS TUNNELED CENTRAL VENOUS "Powerline" CATHETER PLACEMENT  Anesthesia: Conscious Sedation Complications: None Estimated Blood Loss: Minimal  Findings:  LEFT central venous obstruction, at the subclavian vein. Recent R mastectomy and RUE lymphedema. Aborted left-sided port placement, Single lumen tunneled "Powerline" CVC with tip within the hemi-azygous  Plan: Catheter ready for use.  See detailed procedure note with images in PACS. The patient tolerated the procedure well without incident or complication and was returned to Recovery in stable condition.    Roanna Banning, MD [facility] Radiology   Pager. [phone] Clinic. [phone]

## 2023-07-11 ENCOUNTER — Other Ambulatory Visit: Payer: Self-pay | Admitting: Hematology & Oncology

## 2023-07-11 ENCOUNTER — Encounter: Payer: Self-pay | Admitting: *Deleted

## 2023-07-11 ENCOUNTER — Inpatient Hospital Stay: Payer: Medicare PPO | Attending: Hematology & Oncology

## 2023-07-11 VITALS — BP 107/62 | HR 79 | Temp 97.5°F | Resp 17

## 2023-07-11 DIAGNOSIS — Z923 Personal history of irradiation: Secondary | ICD-10-CM | POA: Diagnosis not present

## 2023-07-11 DIAGNOSIS — Z5111 Encounter for antineoplastic chemotherapy: Secondary | ICD-10-CM | POA: Diagnosis not present

## 2023-07-11 DIAGNOSIS — C771 Secondary and unspecified malignant neoplasm of intrathoracic lymph nodes: Secondary | ICD-10-CM | POA: Insufficient documentation

## 2023-07-11 DIAGNOSIS — Z79899 Other long term (current) drug therapy: Secondary | ICD-10-CM | POA: Diagnosis not present

## 2023-07-11 DIAGNOSIS — C50911 Malignant neoplasm of unspecified site of right female breast: Secondary | ICD-10-CM | POA: Diagnosis not present

## 2023-07-11 DIAGNOSIS — Z170 Estrogen receptor positive status [ER+]: Secondary | ICD-10-CM | POA: Diagnosis not present

## 2023-07-11 DIAGNOSIS — C7951 Secondary malignant neoplasm of bone: Secondary | ICD-10-CM | POA: Diagnosis not present

## 2023-07-11 DIAGNOSIS — C50919 Malignant neoplasm of unspecified site of unspecified female breast: Secondary | ICD-10-CM

## 2023-07-11 MED ORDER — HEPARIN SOD (PORK) LOCK FLUSH 100 UNIT/ML IV SOLN
500.0000 [IU] | Freq: Once | INTRAVENOUS | Status: AC
  Administered 2023-07-11: 500 [IU] via INTRAVENOUS

## 2023-07-11 MED ORDER — SODIUM CHLORIDE 0.9% FLUSH
10.0000 mL | Freq: Once | INTRAVENOUS | Status: AC
  Administered 2023-07-11: 10 mL via INTRAVENOUS

## 2023-07-11 NOTE — Progress Notes (Signed)
Patient was scheduled to get port placed on 7/12 but this was aborted and a powerline catheter was placed. Dr Myna Hidalgo has requested that she return to IR to have the port reconsidered, but the patient still has the powerline in place. The original dressing is in placed and the patient would like to know how often the dressing needs to be changed.   Reviewed with her that a bulky surgical dressing should likely be changed today and that we can place a dressing that would be good for 7 days. She will come in this afternoon for the RN's to assess and change dressing.   Oncology Nurse Navigator Documentation     07/11/2023    2:00 PM  Oncology Nurse Navigator Flowsheets  Navigator Follow Up Date: 07/20/2023  Navigator Follow Up Reason: Follow-up Appointment  Navigator Location [facility]  Navigator Encounter Type Telephone  Telephone Incoming Call;Symptom Mgt  Patient Visit Type MedOnc  Treatment Phase Active Tx  Barriers/Navigation Needs No Barriers At This Time  Education Other  Interventions Coordination of Care  Acuity Level 1-No Barriers  Support Groups/Services Friends and Family  Time Spent with Patient 15

## 2023-07-11 NOTE — Patient Instructions (Signed)
Central Line, Adult A central line is a long, thin tube (catheter) that can be used to collect blood for testing or to give medicine through a vein. The tip of the central line ends in a large vein just above the heart (vena cava). A central line may be placed because: You need to get medicines or fluids through an IV for a long period of time. You need nutrition but cannot eat or absorb nutrients. The veins in your hands or arms are difficult to use for IV access. You need a blood transfusion. You need chemotherapy or dialysis. Types of central lines There are four main types of central lines: Peripherally inserted central catheter (PICC) line. This type is used for access of one week or longer. It can be used to draw blood and give fluids or medicines. A PICC looks like an IV tube, but it goes up the arm to the heart. It is usually inserted in the upper arm and taped in place on the arm. Tunneled central line. This type is used for long-term therapy and dialysis. It is placed in a large vein in the neck, chest, or groin. It is inserted through a small incision made over the vein, and then it is advanced to the heart. It is tunneled under the skin and brought out through a second incision. Non-tunneled central line. This type is used for short-term access, usually for a maximum of 7 days. It is often used in the emergency department. It is inserted in the neck, chest, or groin. Implanted port. This type is used for long-term therapy. It can stay in place longer than other types of central lines. It is normally inserted in the upper chest, but it can also be placed in the upper arm or the abdomen. It is inserted and removed with surgery, and it is accessed using a needle. The type of central line that you receive depends on how long you will need it, your medical condition, and the condition of your veins. Tell a health care provider about: Any allergies you have. All medicines you are taking,  including vitamins, herbs, eye drops, creams, and over-the-counter medicines. Any problems you or family members have had with anesthetic medicines. Any blood disorders you have. Any surgeries you have had. Any medical conditions you have. Whether you are pregnant or may be pregnant. What are the risks? Generally, placement and use of a central line is safe. However, problems may occur, including: Infection. A blood clot that blocks the central line or forms in the vein and travels to the heart. Bleeding from the place where the central line was inserted. Developing a hole or crack within the central line. If this happens, the central line will need to be replaced. Central line failure. The catheter moving or coming out of place. What happens before the procedure? Medicines Ask your health care provider about: Changing or stopping your regular medicines. This is especially important if you are taking diabetes medicines or blood thinners. Taking medicines such as aspirin and ibuprofen. These medicines can thin your blood. Do not take these medicines unless your health care provider tells you to take them. Taking over-the-counter medicines, vitamins, herbs, and supplements. General instructions Follow instructions from your health care provider about eating or drinking restrictions. Ask your health care provider: How your procedure site will be marked. What steps will be taken to help prevent infection. These steps may include: Removing hair at the procedure site. Washing skin with a germ-killing soap.   Plan to have a responsible adult take you home from the hospital or clinic. If you will be going home right after the procedure, plan to have a responsible adult care for you for the time you are told. This is important. What happens during the procedure? The procedure will vary depending on the type of central line being placed. In general: An IV will be inserted into one of your  veins. You will be given one or more of the following: A medicine to help you relax (sedative). A medicine to numb the area (local anesthetic). Your skin will be cleaned with a germ-killing (antiseptic) solution, and you may be covered with sterile drapes. Your blood pressure, heart rate, breathing rate, and blood oxygen level will be monitored during the procedure. The central line catheter will be inserted into the vein and advanced to the correct spot. The health care provider may use X-ray equipment to help guide the catheter to the right place. A bandage (dressing) will be placed over the insertion area. The procedure may vary among health care providers and hospitals. What can I expect after the procedure? Your blood pressure, heart rate, breathing rate, and blood oxygen level will be monitored until you leave the hospital or clinic. Antiseptic caps may be placed on the ends of the central line tubing. If you were given a sedative during the procedure, it can affect you for several hours. Do not drive or operate machinery until your health care provider says that it is safe. Follow these instructions at home: Flushing and cleaning the central line  Follow instructions from your health care provider about flushing and cleaning the central line and the area around it. Only use sterile supplies to flush the central line. Use supplies from your health care provider, a pharmacy, or another source that is recommended by your health care provider. Before you flush the central line or clean the central line or the area around it: Wash your hands with soap and water for at least 20 seconds. If soap and water are not available, use alcohol-based hand sanitizer. Clean the central line hub with rubbing alcohol. Unless directed otherwise by the manufacturer's instructions, scrub using a twisting motion and rub for 10 to 15 seconds or for 30 twists. Be sure you scrub the top of the hub, not just the  sides. Never reuse alcohol pads. Let the hub dry before use. Prevent it from touching anything while drying. Caring for the incision or central line site Check your incision or central line site every day for signs of infection. Check for: Redness, swelling, or pain. Fluid or blood. Warmth. Pus or a bad smell. Keep the insertion site of your central line clean and dry at all times. Change your dressing only as told by your health care provider. Keep your dressing dry. If it gets wet, have it changed as soon as possible. General instructions Follow instructions from your health care provider for the type of device that you have. Keep the tube clamped, unless it is being used. If the central line accidentally gets pulled on, make sure: The dressing is okay. There is no bleeding. The line has not been pulled out. Do not use scissors or sharp objects near the tube. Do not take baths, swim, or use a hot tub until your health care provider approves. Ask your health care provider if you may take showers. You may only be allowed to take sponge baths. Ask your health care provider what activities are   safe for you. You may be restricted from lifting or making repetitive arm movements on the side of your central line. Take over-the-counter and prescription medicines only as told by your health care provider. Keep all follow-up visits. This is important. Storage and disposal of supplies Keep your supplies in a clean, dry location. Throw away any used syringes in a disposal container that is meant for sharp items (sharps container). You can buy a sharps container from a pharmacy, or you can make one by using an empty hard plastic bottle with a cover. Place any used dressings or infusion bags into a plastic bag. Throw that bag in the trash. Contact a health care provider if: You have redness, swelling, or pain around your insertion site. You have fluid or blood coming from your insertion site. Your  insertion site feels warm to the touch. You have pus or a bad smell coming from your insertion site. Get help right away if: You have: A fever or chills. Shortness of breath. Chest pain or a racing heartbeat. Swelling in your neck, face, chest, or arm on the side of your central line. You feel dizzy or you faint. Your incision or central line site has red streaks spreading away from the area. Your incision or central line site is bleeding and does not stop. Your central line is difficult to flush or will not flush. You do not get a blood return from the central line. Your central line gets loose or damaged or comes out. Your catheter leaks when flushed or when fluids are infused into it. Summary A central line is a long, thin tube (catheter) that can be used to give medicine through a vein. Follow specific instructions from your health care provider for the type of device that you have. Keep the insertion site of your central line clean and dry at all times. Keep the tube clamped, unless it is being used. This information is not intended to replace advice given to you by your health care provider. Make sure you discuss any questions you have with your health care provider. Document Revised: 08/14/2020 Document Reviewed: 08/14/2020 Elsevier Patient Education  2024 [employer]  

## 2023-07-12 ENCOUNTER — Encounter: Payer: Self-pay | Admitting: Dietician

## 2023-07-12 NOTE — Progress Notes (Signed)
Scheduling offered a nutrition consult for recent weight loss.  Per scheduling response, patient declined nutrition consult at this time.   Gennaro Africa, RDN, LDN Registered Dietitian, [facility] Part Time Remote (Usual office hours: Tuesday-Thursday) Mobile: [phone] Remote Office: [phone]

## 2023-07-14 DIAGNOSIS — H3581 Retinal edema: Secondary | ICD-10-CM | POA: Diagnosis not present

## 2023-07-14 DIAGNOSIS — H2513 Age-related nuclear cataract, bilateral: Secondary | ICD-10-CM | POA: Diagnosis not present

## 2023-07-18 ENCOUNTER — Other Ambulatory Visit (HOSPITAL_COMMUNITY): Payer: Medicare PPO

## 2023-07-20 ENCOUNTER — Inpatient Hospital Stay: Payer: Medicare PPO

## 2023-07-20 ENCOUNTER — Other Ambulatory Visit: Payer: Self-pay | Admitting: *Deleted

## 2023-07-20 ENCOUNTER — Inpatient Hospital Stay: Payer: Medicare PPO | Admitting: Hematology & Oncology

## 2023-07-20 ENCOUNTER — Other Ambulatory Visit (HOSPITAL_BASED_OUTPATIENT_CLINIC_OR_DEPARTMENT_OTHER): Payer: Self-pay

## 2023-07-20 ENCOUNTER — Encounter: Payer: Self-pay | Admitting: *Deleted

## 2023-07-20 ENCOUNTER — Other Ambulatory Visit (HOSPITAL_COMMUNITY): Payer: Self-pay

## 2023-07-20 ENCOUNTER — Encounter: Payer: Self-pay | Admitting: Hematology & Oncology

## 2023-07-20 VITALS — BP 125/53 | HR 74 | Temp 97.9°F | Resp 20 | Ht 66.0 in | Wt 155.0 lb

## 2023-07-20 DIAGNOSIS — C50919 Malignant neoplasm of unspecified site of unspecified female breast: Secondary | ICD-10-CM

## 2023-07-20 DIAGNOSIS — C771 Secondary and unspecified malignant neoplasm of intrathoracic lymph nodes: Secondary | ICD-10-CM | POA: Diagnosis not present

## 2023-07-20 DIAGNOSIS — C7951 Secondary malignant neoplasm of bone: Secondary | ICD-10-CM

## 2023-07-20 DIAGNOSIS — Z79899 Other long term (current) drug therapy: Secondary | ICD-10-CM | POA: Diagnosis not present

## 2023-07-20 DIAGNOSIS — Z923 Personal history of irradiation: Secondary | ICD-10-CM | POA: Diagnosis not present

## 2023-07-20 DIAGNOSIS — C50911 Malignant neoplasm of unspecified site of right female breast: Secondary | ICD-10-CM | POA: Diagnosis not present

## 2023-07-20 DIAGNOSIS — Z170 Estrogen receptor positive status [ER+]: Secondary | ICD-10-CM | POA: Diagnosis not present

## 2023-07-20 DIAGNOSIS — Z5111 Encounter for antineoplastic chemotherapy: Secondary | ICD-10-CM | POA: Diagnosis not present

## 2023-07-20 LAB — CMP (CANCER CENTER ONLY)
ALT: 11 U/L (ref 0–44)
AST: 18 U/L (ref 15–41)
Albumin: 3.7 g/dL (ref 3.5–5.0)
Alkaline Phosphatase: 79 U/L (ref 38–126)
Anion gap: 10 (ref 5–15)
BUN: 17 mg/dL (ref 8–23)
CO2: 36 mmol/L — ABNORMAL HIGH (ref 22–32)
Calcium: 9.1 mg/dL (ref 8.9–10.3)
Chloride: 92 mmol/L — ABNORMAL LOW (ref 98–111)
Creatinine: 1.46 mg/dL — ABNORMAL HIGH (ref 0.44–1.00)
GFR, Estimated: 38 mL/min — ABNORMAL LOW (ref >60)
Glucose, Bld: 120 mg/dL — ABNORMAL HIGH (ref 70–99)
Potassium: 2.8 mmol/L — ABNORMAL LOW (ref 3.5–5.1)
Sodium: 138 mmol/L (ref 135–145)
Total Bilirubin: 0.9 mg/dL (ref 0.3–1.2)
Total Protein: 6.5 g/dL (ref 6.5–8.1)

## 2023-07-20 LAB — CBC WITH DIFFERENTIAL (CANCER CENTER ONLY)
Abs Immature Granulocytes: 0.03 10*3/uL (ref 0.00–0.07)
Basophils Absolute: 0 10*3/uL (ref 0.0–0.1)
Basophils Relative: 1 %
Eosinophils Absolute: 0 10*3/uL (ref 0.0–0.5)
Eosinophils Relative: 1 %
HCT: 27.9 % — ABNORMAL LOW (ref 36.0–46.0)
Hemoglobin: 10 g/dL — ABNORMAL LOW (ref 12.0–15.0)
Immature Granulocytes: 1 %
Lymphocytes Relative: 16 %
Lymphs Abs: 0.4 10*3/uL — ABNORMAL LOW (ref 0.7–4.0)
MCH: 40.8 pg — ABNORMAL HIGH (ref 26.0–34.0)
MCHC: 35.8 g/dL (ref 30.0–36.0)
MCV: 113.9 fL — ABNORMAL HIGH (ref 80.0–100.0)
Monocytes Absolute: 0.2 10*3/uL (ref 0.1–1.0)
Monocytes Relative: 7 %
Neutro Abs: 1.9 10*3/uL (ref 1.7–7.7)
Neutrophils Relative %: 74 %
Platelet Count: 239 10*3/uL (ref 150–400)
RBC: 2.45 MIL/uL — ABNORMAL LOW (ref 3.87–5.11)
RDW: 14.4 % (ref 11.5–15.5)
Smear Review: NORMAL
WBC Count: 2.6 10*3/uL — ABNORMAL LOW (ref 4.0–10.5)
nRBC: 0 % (ref 0.0–0.2)

## 2023-07-20 LAB — LACTATE DEHYDROGENASE: LDH: 263 U/L — ABNORMAL HIGH (ref 98–192)

## 2023-07-20 MED ORDER — HEPARIN SOD (PORK) LOCK FLUSH 100 UNIT/ML IV SOLN
500.0000 [IU] | Freq: Once | INTRAVENOUS | Status: AC
  Administered 2023-07-20: 500 [IU] via INTRAVENOUS

## 2023-07-20 MED ORDER — SODIUM CHLORIDE 0.9% FLUSH
10.0000 mL | Freq: Once | INTRAVENOUS | Status: AC
  Administered 2023-07-20: 10 mL via INTRAVENOUS

## 2023-07-20 MED ORDER — SODIUM CHLORIDE FLUSH 0.9 % IV SOLN
10.0000 mL | Freq: Two times a day (BID) | INTRAVENOUS | 3 refills | Status: DC
  Filled 2023-07-20: qty 600, 30d supply, fill #0
  Filled 2023-08-15: qty 600, 30d supply, fill #1
  Filled 2023-09-19: qty 600, 30d supply, fill #2
  Filled 2023-10-24: qty 600, 30d supply, fill #3

## 2023-07-20 MED ORDER — ALTEPLASE 2 MG IJ SOLR
2.0000 mg | Freq: Once | INTRAMUSCULAR | Status: AC
  Administered 2023-07-20: 2 mg

## 2023-07-20 MED ORDER — FULVESTRANT 250 MG/5ML IM SOSY
500.0000 mg | PREFILLED_SYRINGE | Freq: Once | INTRAMUSCULAR | Status: AC
  Administered 2023-07-20: 500 mg via INTRAMUSCULAR

## 2023-07-20 MED ORDER — HEPARIN SOD (PORK) LOCK FLUSH 100 UNIT/ML IV SOLN
250.0000 [IU] | Freq: Two times a day (BID) | INTRAVENOUS | 3 refills | Status: DC
  Filled 2023-07-20: qty 1, 1d supply, fill #0

## 2023-07-20 MED ORDER — HEPARIN NA (PORK) LOCK FLSH PF 100 UNIT/ML IV SOLN
250.0000 [IU] | Freq: Two times a day (BID) | INTRAVENOUS | 3 refills | Status: DC
  Filled 2023-07-20: qty 300, 30d supply, fill #0
  Filled 2023-08-15: qty 300, 30d supply, fill #1
  Filled 2023-09-19: qty 180, 48d supply, fill #2
  Filled 2023-09-19: qty 120, 12d supply, fill #2
  Filled 2023-10-24: qty 300, 60d supply, fill #3
  Filled 2023-10-27: qty 300, 30d supply, fill #3
  Filled ????-??-??: fill #3

## 2023-07-20 NOTE — Patient Instructions (Signed)

## 2023-07-20 NOTE — Progress Notes (Unsigned)
Hematology and Oncology Follow Up Visit  EMI LYMON [id number] 03/09/1951 72 y.o. 07/20/2023   Principle Diagnosis:  Metastatic breast cancer-bone metastasis/hilar lymph node metastasis- ER+/PR+/HER-2 (2+) -- PIK3CA (+)  Current Therapy:   Faslodex 500 mg IM monthly -start on 11/09/2022 Ribociclib 600 mg p.o. daily (21/7) --start on 11/09/2022 Zometa 4 mg IV every 3 months -next dose on 07/2023 Radiation therapy to left hip -completed in 11/23/2022     Interim History:  Ms. Borgerding is back for follow-up.  Unfortunately, she had a lot of issues with having a Port-A-Cath put in.  In reality, a Port-A-Cath could not be put in.  She now has a "Powerline" catheter in place.  We are trying to get blood this today.  Hopefully, we will be able to get some blood return.  I hate that she cannot have a Port-A-Cath placed.  I know that IR tried really hard to place 1.  She is doing okay on the ribociclib.  She has had no problems with nausea or vomiting.  She has had no problems with diarrhea.  She has had no issues with rashes.  There is been no leg swelling.  Her last CA 27.29 was 56.  She will be due for a PET scan in a few weeks.  She is eating well.  She is having no problems with constipation or diarrhea.  She has had no problems with cough.  There is no shortness of breath.  She has had no bleeding.  Overall, I would have to say that her performance status is probably ECOG 1.     Medications:  Current Outpatient Medications:    amLODipine (NORVASC) 2.5 MG tablet, Take 1 pill a day., Disp: 90 tablet, Rfl: 1   aspirin 81 MG tablet, Take 81 mg by mouth daily., Disp: , Rfl:    bisoprolol-hydrochlorothiazide (ZIAC) 10-6.25 MG tablet, Take 1 tablet by mouth daily., Disp: 90 tablet, Rfl: 1   cholecalciferol (VITAMIN D) 1000 units tablet, Take 1,000 Units by mouth daily., Disp: , Rfl:    hydrochlorothiazide (HYDRODIURIL) 25 MG tablet, TAKE 1 TABLET(25 MG) BY MOUTH DAILY, Disp: 90 tablet,  Rfl: 1   OLANZapine (ZYPREXA) 5 MG tablet, TAKE 1 TABLET(5 MG) BY MOUTH AT BEDTIME, Disp: 30 tablet, Rfl: 3   ondansetron (ZOFRAN) 8 MG tablet, TAKE 1 TABLET(8 MG) BY MOUTH EVERY 8 HOURS AS NEEDED FOR NAUSEA OR VOMITING, Disp: 30 tablet, Rfl: 2   potassium chloride SA (KLOR-CON M) 20 MEQ tablet, Take 1 tablet (20 mEq total) by mouth 2 (two) times daily., Disp: 60 tablet, Rfl: 2   ribociclib succ (KISQALI 600MG  DAILY DOSE) 200 MG Therapy Pack, Take 3 tablets (600 mg total) by mouth daily. Take for 21 days on, 7 days off, repeat every 28 days., Disp: 63 tablet, Rfl: 6   zoledronic acid (ZOMETA) 4 MG/5ML injection, Inject 4 mg into the vein every 3 (three) months., Disp: , Rfl:    meclizine (ANTIVERT) 12.5 MG tablet, Take 1 tablet (12.5 mg total) by mouth 3 (three) times daily as needed for dizziness. (Patient not taking: Reported on 06/22/2023), Disp: 30 tablet, Rfl: 4   traMADol (ULTRAM) 50 MG tablet, Take 1 tablet (50 mg total) by mouth every 6 (six) hours as needed (mild pain). (Patient not taking: Reported on 06/22/2023), Disp: 10 tablet, Rfl: 0 No current facility-administered medications for this visit.  Facility-Administered Medications Ordered in Other Visits:    heparin lock flush 100 unit/mL, 500 Units, Intravenous, Once, Stepheni Cameron,  Rose Phi, MD   sodium chloride flush (NS) 0.9 % injection 10 mL, 10 mL, Intravenous, Once, Aeris Hersman, Rose Phi, MD  Allergies: No Known Allergies  Past Medical History, Surgical history, Social history, and Family History were reviewed and updated.  Review of Systems: Review of Systems  Constitutional:  Positive for fatigue.  HENT:  Negative.    Eyes: Negative.   Respiratory: Negative.    Cardiovascular: Negative.   Gastrointestinal:  Positive for constipation and nausea.  Endocrine: Negative.   Genitourinary: Negative.    Musculoskeletal:  Positive for arthralgias and myalgias.  Skin: Negative.   Neurological: Negative.   Hematological: Negative.    Psychiatric/Behavioral: Negative.      Physical Exam: Vital signs show temperature of 98 4.4.  Pulse 100.  Blood pressure 156/92.  Weight is 167 pounds.  Wt Readings from Last 3 Encounters:  07/20/23 155 lb 0.6 oz (70.3 kg)  06/22/23 158 lb (71.7 kg)  05/26/23 167 lb (75.8 kg)    Physical Exam Vitals reviewed.  Constitutional:      Comments: Left breast shows no masses, edema or erythema.  There is no left axillary adenopathy.  Her right chest wall shows a well-healing mastectomy.  There is no erythema or warmth.  There is no right axillary adenopathy.  Marland Kitchen  HENT:     Head: Normocephalic and atraumatic.  Eyes:     Pupils: Pupils are equal, round, and reactive to light.  Cardiovascular:     Rate and Rhythm: Normal rate and regular rhythm.     Heart sounds: Normal heart sounds.  Pulmonary:     Effort: Pulmonary effort is normal.     Breath sounds: Normal breath sounds.  Abdominal:     General: Bowel sounds are normal.     Palpations: Abdomen is soft.  Musculoskeletal:        General: No tenderness or deformity. Normal range of motion.     Cervical back: Normal range of motion.  Lymphadenopathy:     Cervical: No cervical adenopathy.  Skin:    General: Skin is warm and dry.     Findings: No erythema or rash.  Neurological:     Mental Status: She is alert and oriented to person, place, and time.  Psychiatric:        Behavior: Behavior normal.        Thought Content: Thought content normal.        Judgment: Judgment normal.      Lab Results  Component Value Date   WBC 2.5 (L) 06/22/2023   HGB 11.3 (L) 06/22/2023   HCT 31.5 (L) 06/22/2023   MCV 113.3 (H) 06/22/2023   PLT 248 06/22/2023     Chemistry      Component Value Date/Time   NA 136 06/22/2023 1539   K 3.5 06/22/2023 1539   CL 95 (L) 06/22/2023 1539   CO2 32 06/22/2023 1539   BUN 18 06/22/2023 1539   CREATININE 1.35 (H) 06/22/2023 1539      Component Value Date/Time   CALCIUM 9.7 06/22/2023 1539    ALKPHOS 88 06/22/2023 1539   AST 22 06/22/2023 1539   ALT 14 06/22/2023 1539   BILITOT 0.7 06/22/2023 1539       Impression and Plan: Ms. Schmader is a very nice 72 year old white female.  She has metastatic breast cancer.  We have her on antiestrogen therapy.  It will be interesting to see what her CA 27-29 is.  Hopefully, this will be down a  little bit.  Again, we will have to set her up with a PET scan.  Will get this done for we get her back in August.  She will get her Faslodex today.  Her potassium is quite low.  We will hide make sure she gets on some oral potassium at home.  I know she has been prescribed some oral potassium.  We need to make sure that she is taking this.  Her white cell count is down a little bit.  This, I am sure comes from the ribociclib.  We will get her back in 1 month.  When she comes back she will also get her Zometa.    Josph Macho, MD 7/24/202411:23 AM

## 2023-07-20 NOTE — Patient Instructions (Addendum)
Tunneled Central Venous Catheter Flushing Guide  It is important to flush your tunneled central venous catheter each time you use it, both before and after you use it. Flushing your catheter will help prevent it from clogging. What are the risks? Risks may include: Infection. Air getting into the catheter and bloodstream. Supplies needed: A clean pair of gloves. A disinfecting wipe. Use an alcohol wipe, chlorhexidine wipe, or iodine wipe as told by your health care provider. A 10 mL syringe that has been prefilled with saline solution. An empty 10 mL syringe, if your catheter was flushed with a substance called heparin (heparin locked). Clean end cap for catheter injection port or ports. How to flush your catheter These are general guidelines. When you flush your catheter, make sure you follow any specific instructions from your health care provider or the manufacturer. The manufacturer's instructions will contain information about the catheter, add-on devices or caps, the correct syringe size for flushing, and the correct flushing solutions. Flushing your catheter before use If there is heparin in your catheter: Wash your hands with soap and water for at least 20 seconds before caring for your catheter. If soap and water are not available, use hand sanitizer. Put on gloves. If injection port has a cap, remove the cap. Scrub the injection port for a minimum of 15 seconds with a disinfecting wipe. Unclamp the catheter. Attach the empty syringe to the injection port. Pull the syringe plunger back and withdraw 10 mL of blood. Place the syringe into an appropriate waste container. Scrub the injection port for 15 seconds with a disinfecting wipe. Attach the prefilled saline syringe to the injection port. Flush the catheter by pushing the plunger forward until all but a small amount (0.5-1.0 mL) of the liquid from the syringe is in the catheter. Remove the syringe from the injection  port. Clamp the catheter. Apply clean cap to injection port. If there is no heparin in your catheter: Wash your hands with soap and water for at least 20 seconds before caring for your catheter. If soap and water are not available, use hand sanitizer. Put on gloves. If injection port has a cap, remove the cap. Scrub the injection port for 15 seconds with a disinfecting wipe. Unclamp the catheter. Attach the prefilled syringe to the injection port. Flush the catheter by pushing the plunger forward until 5 mL of the liquid from the syringe is in the catheter. Pull back lightly on the syringe until you see blood in the catheter. If you have been asked to collect any blood, follow your health care provider's instructions. Otherwise, finish flushing the catheter. Remove the syringe from the injection port. Clamp the catheter. Apply clean cap to injection port.  Flushing your catheter after use Wash your hands with soap and water for at least 20 seconds before caring for your catheter. If soap and water are not available, use hand sanitizer. Put on gloves. If injection port has a cap, remove the cap. Scrub the injection port for 15 seconds with a disinfecting wipe. Unclamp the catheter. Attach the prefilled syringe to the injection port. Flush the catheter by pushing the plunger forward until all but a small amount (0.5-1.0 mL) of the liquid from the syringe is in the catheter. Remove the syringe from the injection port. Clamp the catheter. Apply clean cap to injection port. Problems and solutions If blood cannot be completely cleared from the injection port, you may need to have the injection port replaced. If the catheter  is difficult to flush, use the pulsing method. The pulsing method involves pushing only a few milliliters of solution into the catheter at a time and pausing between pushes. If you do not see blood in the catheter when you pull back on the syringe, change your body  position, such as by raising your arms above your head. Take a deep breath and cough. Then, pull back on the syringe. If you still do not see blood, flush the catheter with a small amount of solution. Then, change positions again and take a breath or cough. Pull back on the syringe again. If you still do not see blood, finish flushing the catheter and contact your health care provider. Do not use your catheter until your health care provider says it is okay. General tips Have someone help you flush your catheter, if possible. Do not force fluid through your catheter. Do not use a syringe that is larger or smaller than 10 mL. Using a smaller syringe can make the catheter burst. If your catheter has been heparin locked, do not use your catheter until you withdraw the heparin and flush the catheter with a saline syringe. Contact a health care provider if: You cannot see any blood in the catheter when you flush it before using it. Your catheter is difficult to flush. You cannot flush the catheter. Get help right away if: There are cracks or breaks in the catheter. Your catheter comes loose or gets pulled completely out. If this happens, press on your catheter site firmly with a clean cloth until you can get medical help. You develop bleeding from your catheter or your insertion site, and your bleeding does not stop. You have swelling in your shoulder, neck, chest, or face. You have chest pain or difficulty breathing. These symptoms may represent a serious problem that is an emergency. Do not wait to see if the symptoms will go away. Get medical help right away. Call your local emergency services (911 in the [place] Do not drive yourself to the hospital. Summary It is important to flush your tunneled central venous catheter each time you use it, both before and after you use it. When you flush your catheter, make sure you follow any specific instructions from your health care provider or the  manufacturer. Wash your hands with soap and water for at least 20 seconds before caring for your catheter. Scrub the injection port for 15 seconds with a disinfecting wipe before you flush it. Contact a health care provider if you cannot flush the catheter. This information is not intended to replace advice given to you by your health care provider. Make sure you discuss any questions you have with your health care provider. Document Revised: 04/20/2021 Document Reviewed: 04/20/2021 Elsevier Patient Education  2024 [employer] Fulvestrant Injection What is this medication? FULVESTRANT (ful VES trant) treats breast cancer. It works by blocking the hormone estrogen in breast tissue, which prevents breast cancer cells from spreading or growing. This medicine may be used for other purposes; ask your health care provider or pharmacist if you have questions. COMMON BRAND NAME(S): FASLODEX What should I tell my care team before I take this medication? They need to know if you have any of these conditions: Bleeding disorder Liver disease Low blood cell levels, such as low white cells, red cells, and platelets An unusual or allergic reaction to fulvestrant, other medications, foods, dyes, or preservatives Pregnant or trying to get pregnant Breast-feeding How should I use this medication? This medication  is injected into a muscle. It is given by your care team in a hospital or clinic setting. Talk to your care team about the use of this medication in children. Special care may be needed. Overdosage: If you think you have taken too much of this medicine contact a poison control center or emergency room at once. NOTE: This medicine is only for you. Do not share this medicine with others. What if I miss a dose? Keep appointments for follow-up doses. It is important not to miss your dose. Call your care team if you are unable to keep an appointment. What may interact with this medication? Certain  medications that prevent or treat blood clots, such as warfarin, enoxaparin, dalteparin, apixaban, dabigatran, rivaroxaban This list may not describe all possible interactions. Give your health care provider a list of all the medicines, herbs, non-prescription drugs, or dietary supplements you use. Also tell them if you smoke, drink alcohol, or use illegal drugs. Some items may interact with your medicine. What should I watch for while using this medication? Your condition will be monitored carefully while you are receiving this medication. You may need blood work while taking this medication. Talk to your care team if you may be pregnant. Serious birth defects can occur if you take this medication during pregnancy and for 1 year after the last dose. You will need a negative pregnancy test before starting this medication. Contraception is recommended while taking this medication and for 1 year after the last dose. Your care team can help you find the option that works for you. Do not breastfeed while taking this medication and for 1 year after the last dose. This medication may cause infertility. Talk to your care team if you are concerned about your fertility. What side effects may I notice from receiving this medication? Side effects that you should report to your care team as soon as possible: Allergic reactions or angioedema--skin rash, itching or hives, swelling of the face, eyes, lips, tongue, arms, or legs, trouble swallowing or breathing Pain, tingling, or numbness in the hands or feet Side effects that usually do not require medical attention (report to your care team if they continue or are bothersome): Bone, joint, or muscle pain Constipation Headache Hot flashes Nausea Pain, redness, or irritation at injection site Unusual weakness or fatigue This list may not describe all possible side effects. Call your doctor for medical advice about side effects. You may report side effects to FDA  at 1-800-FDA-1088. Where should I keep my medication? This medication is given in a hospital or clinic. It will not be stored at home. NOTE: This sheet is a summary. It may not cover all possible information. If you have questions about this medicine, talk to your doctor, pharmacist, or health care provider.  2024 Elsevier/Gold Standard (2022-04-27 00:00:00)

## 2023-07-20 NOTE — Progress Notes (Signed)
This nurse showed the patient and her husband how to flush the single lumen line to the left chest. She will come to [facility] every Wednesday for a flush and weekly dressing change. A prescription for normal saline flushes and heparin flushes sent to [facility]. Her pharmacy did not have them in stock. Husband and patient verbalized understanding of how to flush the line. Tech back done. They are also aware that the prescription is downstairs to pick up.

## 2023-07-21 ENCOUNTER — Other Ambulatory Visit: Payer: Self-pay

## 2023-07-21 ENCOUNTER — Telehealth: Payer: Self-pay

## 2023-07-21 ENCOUNTER — Other Ambulatory Visit (HOSPITAL_COMMUNITY): Payer: Self-pay

## 2023-07-21 ENCOUNTER — Encounter: Payer: Self-pay | Admitting: Hematology & Oncology

## 2023-07-21 ENCOUNTER — Other Ambulatory Visit (HOSPITAL_BASED_OUTPATIENT_CLINIC_OR_DEPARTMENT_OTHER): Payer: Self-pay

## 2023-07-21 LAB — CANCER ANTIGEN 27.29: CA 27.29: 42.5 U/mL — ABNORMAL HIGH (ref 0.0–38.6)

## 2023-07-21 NOTE — Telephone Encounter (Signed)
Per Dr. Myna Hidalgo request this RN called patient to ask if she was taking her potassium pills as prescribed as her potassium level was 2.8  Pt stated she has not been taking them but will restart immediately. Pt stated she has a hard time swallowing them. Pt instructed to try to put the pill in the middle of a spoonful of applesauce to help with swallowing.  Pt educated on why a low potassium is concerning. Pt verbalized understanding and had no further questions.

## 2023-07-21 NOTE — Progress Notes (Signed)
Patient will need a PET prior to her follow up appointment. Scheduled for 08/09/2023.  Patient is aware of PET appointment including date, time, and location. The following prep is reviewed with patient and confirmed with teachback: - arrive 30 minutes before appointment time - NPO except water for 6h before scan. No candy, no gum - hold any diabetic medication the morning of the scan - have a low carb dinner the night prior Radiology Information sheet also mailed to patient's home for reinforcement of education.  Oncology Nurse Navigator Documentation     07/20/2023    2:30 PM  Oncology Nurse Navigator Flowsheets  Navigator Follow Up Date: 08/09/2023  Navigator Follow Up Reason: Scan Review  Navigator Location [facility]  Navigator Encounter Type Treatment;Appt/Treatment Plan Review  Telephone Outgoing Call  Patient Visit Type MedOnc  Treatment Phase Active Tx  Barriers/Navigation Needs No Barriers At This Time  Education Other  Interventions Coordination of Care;Education;Psycho-Social Support  Acuity Level 1-No Barriers  Coordination of Care Radiology  Education Method Verbal;Written  Support Groups/Services Friends and Family  Time Spent with Patient 30

## 2023-07-22 ENCOUNTER — Other Ambulatory Visit (HOSPITAL_BASED_OUTPATIENT_CLINIC_OR_DEPARTMENT_OTHER): Payer: Self-pay

## 2023-07-27 ENCOUNTER — Inpatient Hospital Stay: Payer: Medicare PPO

## 2023-07-27 ENCOUNTER — Other Ambulatory Visit (HOSPITAL_BASED_OUTPATIENT_CLINIC_OR_DEPARTMENT_OTHER): Payer: Self-pay

## 2023-07-27 VITALS — BP 111/58 | HR 76 | Temp 97.7°F | Resp 18

## 2023-07-27 DIAGNOSIS — Z79899 Other long term (current) drug therapy: Secondary | ICD-10-CM | POA: Diagnosis not present

## 2023-07-27 DIAGNOSIS — Z923 Personal history of irradiation: Secondary | ICD-10-CM | POA: Diagnosis not present

## 2023-07-27 DIAGNOSIS — Z170 Estrogen receptor positive status [ER+]: Secondary | ICD-10-CM | POA: Diagnosis not present

## 2023-07-27 DIAGNOSIS — Z5111 Encounter for antineoplastic chemotherapy: Secondary | ICD-10-CM | POA: Diagnosis not present

## 2023-07-27 DIAGNOSIS — C50911 Malignant neoplasm of unspecified site of right female breast: Secondary | ICD-10-CM | POA: Diagnosis not present

## 2023-07-27 DIAGNOSIS — C771 Secondary and unspecified malignant neoplasm of intrathoracic lymph nodes: Secondary | ICD-10-CM | POA: Diagnosis not present

## 2023-07-27 DIAGNOSIS — C7951 Secondary malignant neoplasm of bone: Secondary | ICD-10-CM | POA: Diagnosis not present

## 2023-07-27 DIAGNOSIS — C50919 Malignant neoplasm of unspecified site of unspecified female breast: Secondary | ICD-10-CM

## 2023-07-27 MED ORDER — SODIUM CHLORIDE 0.9% FLUSH: 10.0000 mL | INTRAVENOUS | Status: DC | PRN

## 2023-07-27 NOTE — Patient Instructions (Signed)
Tunneled Central Venous Catheter Flushing Guide  It is important to flush your tunneled central venous catheter each time you use it, both before and after you use it. Flushing your catheter will help prevent it from clogging. What are the risks? Risks may include: Infection. Air getting into the catheter and bloodstream. Supplies needed: A clean pair of gloves. A disinfecting wipe. Use an alcohol wipe, chlorhexidine wipe, or iodine wipe as told by your health care provider. A 10 mL syringe that has been prefilled with saline solution. An empty 10 mL syringe, if your catheter was flushed with a substance called heparin (heparin locked). Clean end cap for catheter injection port or ports. How to flush your catheter These are general guidelines. When you flush your catheter, make sure you follow any specific instructions from your health care provider or the manufacturer. The manufacturer's instructions will contain information about the catheter, add-on devices or caps, the correct syringe size for flushing, and the correct flushing solutions. Flushing your catheter before use If there is heparin in your catheter: Wash your hands with soap and water for at least 20 seconds before caring for your catheter. If soap and water are not available, use hand sanitizer. Put on gloves. If injection port has a cap, remove the cap. Scrub the injection port for a minimum of 15 seconds with a disinfecting wipe. Unclamp the catheter. Attach the empty syringe to the injection port. Pull the syringe plunger back and withdraw 10 mL of blood. Place the syringe into an appropriate waste container. Scrub the injection port for 15 seconds with a disinfecting wipe. Attach the prefilled saline syringe to the injection port. Flush the catheter by pushing the plunger forward until all but a small amount (0.5-1.0 mL) of the liquid from the syringe is in the catheter. Remove the syringe from the injection  port. Clamp the catheter. Apply clean cap to injection port. If there is no heparin in your catheter: Wash your hands with soap and water for at least 20 seconds before caring for your catheter. If soap and water are not available, use hand sanitizer. Put on gloves. If injection port has a cap, remove the cap. Scrub the injection port for 15 seconds with a disinfecting wipe. Unclamp the catheter. Attach the prefilled syringe to the injection port. Flush the catheter by pushing the plunger forward until 5 mL of the liquid from the syringe is in the catheter. Pull back lightly on the syringe until you see blood in the catheter. If you have been asked to collect any blood, follow your health care provider's instructions. Otherwise, finish flushing the catheter. Remove the syringe from the injection port. Clamp the catheter. Apply clean cap to injection port.  Flushing your catheter after use Wash your hands with soap and water for at least 20 seconds before caring for your catheter. If soap and water are not available, use hand sanitizer. Put on gloves. If injection port has a cap, remove the cap. Scrub the injection port for 15 seconds with a disinfecting wipe. Unclamp the catheter. Attach the prefilled syringe to the injection port. Flush the catheter by pushing the plunger forward until all but a small amount (0.5-1.0 mL) of the liquid from the syringe is in the catheter. Remove the syringe from the injection port. Clamp the catheter. Apply clean cap to injection port. Problems and solutions If blood cannot be completely cleared from the injection port, you may need to have the injection port replaced. If the catheter  is difficult to flush, use the pulsing method. The pulsing method involves pushing only a few milliliters of solution into the catheter at a time and pausing between pushes. If you do not see blood in the catheter when you pull back on the syringe, change your body  position, such as by raising your arms above your head. Take a deep breath and cough. Then, pull back on the syringe. If you still do not see blood, flush the catheter with a small amount of solution. Then, change positions again and take a breath or cough. Pull back on the syringe again. If you still do not see blood, finish flushing the catheter and contact your health care provider. Do not use your catheter until your health care provider says it is okay. General tips Have someone help you flush your catheter, if possible. Do not force fluid through your catheter. Do not use a syringe that is larger or smaller than 10 mL. Using a smaller syringe can make the catheter burst. If your catheter has been heparin locked, do not use your catheter until you withdraw the heparin and flush the catheter with a saline syringe. Contact a health care provider if: You cannot see any blood in the catheter when you flush it before using it. Your catheter is difficult to flush. You cannot flush the catheter. Get help right away if: There are cracks or breaks in the catheter. Your catheter comes loose or gets pulled completely out. If this happens, press on your catheter site firmly with a clean cloth until you can get medical help. You develop bleeding from your catheter or your insertion site, and your bleeding does not stop. You have swelling in your shoulder, neck, chest, or face. You have chest pain or difficulty breathing. These symptoms may represent a serious problem that is an emergency. Do not wait to see if the symptoms will go away. Get medical help right away. Call your local emergency services (911 in the [place] Do not drive yourself to the hospital. Summary It is important to flush your tunneled central venous catheter each time you use it, both before and after you use it. When you flush your catheter, make sure you follow any specific instructions from your health care provider or the  manufacturer. Wash your hands with soap and water for at least 20 seconds before caring for your catheter. Scrub the injection port for 15 seconds with a disinfecting wipe before you flush it. Contact a health care provider if you cannot flush the catheter. This information is not intended to replace advice given to you by your health care provider. Make sure you discuss any questions you have with your health care provider. Document Revised: 04/20/2021 Document Reviewed: 04/20/2021 Elsevier Patient Education  2024 Elsevier Inc. Fulvestrant Injection What is this medication? FULVESTRANT (ful VES trant) treats breast cancer. It works by blocking the hormone estrogen in breast tissue, which prevents breast cancer cells from spreading or growing. This medicine may be used for other purposes; ask your health care provider or pharmacist if you have questions. COMMON BRAND NAME(S): FASLODEX What should I tell my care team before I take this medication? They need to know if you have any of these conditions: Bleeding disorder Liver disease Low blood cell levels, such as low white cells, red cells, and platelets An unusual or allergic reaction to fulvestrant, other medications, foods, dyes, or preservatives Pregnant or trying to get pregnant Breast-feeding How should I use this medication? This medication  is injected into a muscle. It is given by your care team in a hospital or clinic setting. Talk to your care team about the use of this medication in children. Special care may be needed. Overdosage: If you think you have taken too much of this medicine contact a poison control center or emergency room at once. NOTE: This medicine is only for you. Do not share this medicine with others. What if I miss a dose? Keep appointments for follow-up doses. It is important not to miss your dose. Call your care team if you are unable to keep an appointment. What may interact with this medication? Certain  medications that prevent or treat blood clots, such as warfarin, enoxaparin, dalteparin, apixaban, dabigatran, rivaroxaban This list may not describe all possible interactions. Give your health care provider a list of all the medicines, herbs, non-prescription drugs, or dietary supplements you use. Also tell them if you smoke, drink alcohol, or use illegal drugs. Some items may interact with your medicine. What should I watch for while using this medication? Your condition will be monitored carefully while you are receiving this medication. You may need blood work while taking this medication. Talk to your care team if you may be pregnant. Serious birth defects can occur if you take this medication during pregnancy and for 1 year after the last dose. You will need a negative pregnancy test before starting this medication. Contraception is recommended while taking this medication and for 1 year after the last dose. Your care team can help you find the option that works for you. Do not breastfeed while taking this medication and for 1 year after the last dose. This medication may cause infertility. Talk to your care team if you are concerned about your fertility. What side effects may I notice from receiving this medication? Side effects that you should report to your care team as soon as possible: Allergic reactions or angioedema--skin rash, itching or hives, swelling of the face, eyes, lips, tongue, arms, or legs, trouble swallowing or breathing Pain, tingling, or numbness in the hands or feet Side effects that usually do not require medical attention (report to your care team if they continue or are bothersome): Bone, joint, or muscle pain Constipation Headache Hot flashes Nausea Pain, redness, or irritation at injection site Unusual weakness or fatigue This list may not describe all possible side effects. Call your doctor for medical advice about side effects. You may report side effects to FDA  at 1-800-FDA-1088. Where should I keep my medication? This medication is given in a hospital or clinic. It will not be stored at home. NOTE: This sheet is a summary. It may not cover all possible information. If you have questions about this medicine, talk to your doctor, pharmacist, or health care provider.  2024 Elsevier/Gold Standard (2022-04-27 00:00:00)

## 2023-07-28 ENCOUNTER — Other Ambulatory Visit: Payer: Self-pay

## 2023-08-01 ENCOUNTER — Other Ambulatory Visit (HOSPITAL_COMMUNITY): Payer: Self-pay

## 2023-08-01 ENCOUNTER — Other Ambulatory Visit: Payer: Self-pay

## 2023-08-02 ENCOUNTER — Inpatient Hospital Stay: Payer: Medicare PPO | Attending: Hematology & Oncology

## 2023-08-02 VITALS — BP 116/72 | HR 81 | Temp 97.8°F | Resp 18

## 2023-08-02 DIAGNOSIS — Z79899 Other long term (current) drug therapy: Secondary | ICD-10-CM | POA: Diagnosis not present

## 2023-08-02 DIAGNOSIS — M791 Myalgia, unspecified site: Secondary | ICD-10-CM | POA: Insufficient documentation

## 2023-08-02 DIAGNOSIS — K5900 Constipation, unspecified: Secondary | ICD-10-CM | POA: Diagnosis not present

## 2023-08-02 DIAGNOSIS — C50919 Malignant neoplasm of unspecified site of unspecified female breast: Secondary | ICD-10-CM

## 2023-08-02 DIAGNOSIS — Z5111 Encounter for antineoplastic chemotherapy: Secondary | ICD-10-CM | POA: Diagnosis not present

## 2023-08-02 DIAGNOSIS — Z170 Estrogen receptor positive status [ER+]: Secondary | ICD-10-CM | POA: Diagnosis not present

## 2023-08-02 DIAGNOSIS — Z923 Personal history of irradiation: Secondary | ICD-10-CM | POA: Diagnosis not present

## 2023-08-02 DIAGNOSIS — R5383 Other fatigue: Secondary | ICD-10-CM | POA: Diagnosis not present

## 2023-08-02 DIAGNOSIS — M2550 Pain in unspecified joint: Secondary | ICD-10-CM | POA: Insufficient documentation

## 2023-08-02 DIAGNOSIS — R110 Nausea: Secondary | ICD-10-CM | POA: Insufficient documentation

## 2023-08-02 DIAGNOSIS — C50911 Malignant neoplasm of unspecified site of right female breast: Secondary | ICD-10-CM | POA: Diagnosis not present

## 2023-08-02 DIAGNOSIS — R232 Flushing: Secondary | ICD-10-CM | POA: Diagnosis not present

## 2023-08-02 DIAGNOSIS — C771 Secondary and unspecified malignant neoplasm of intrathoracic lymph nodes: Secondary | ICD-10-CM | POA: Diagnosis not present

## 2023-08-02 DIAGNOSIS — C7951 Secondary malignant neoplasm of bone: Secondary | ICD-10-CM | POA: Insufficient documentation

## 2023-08-02 MED ORDER — SODIUM CHLORIDE 0.9% FLUSH
10.0000 mL | Freq: Once | INTRAVENOUS | Status: AC
  Administered 2023-08-02: 10 mL via INTRAVENOUS

## 2023-08-02 MED ORDER — HEPARIN SOD (PORK) LOCK FLUSH 100 UNIT/ML IV SOLN
500.0000 [IU] | Freq: Once | INTRAVENOUS | Status: AC
  Administered 2023-08-02: 500 [IU] via INTRAVENOUS

## 2023-08-02 NOTE — Patient Instructions (Signed)
PICC Home Care Guide A peripherally inserted central catheter (PICC) is a form of IV access that allows medicines and IV fluids to be quickly put into the blood and spread throughout the body. The PICC is a long, thin, flexible tube (catheter) that is put into a vein in a person's arm or leg. The catheter ends in a large vein just outside the heart called the superior vena cava (SVC). After the PICC is put in, a chest X-ray may be done to make sure that it is in the right place. A PICC may be placed for different reasons, such as: To give medicines and liquid nutrition. To give IV fluids and blood products. To take blood samples often. If there is trouble placing a peripheral intravenous (PIV) catheter. If cared for properly, a PICC can remain in place for many months. Having a PICC can allow you to go home from the hospital sooner and continue treatment at home. Medicines and PICC care can be managed at home by a family member, caregiver, or home health care team. What are the risks? Generally, having a PICC is safe. However, problems may occur, including: A blood clot (thrombus) forming in or at the end of the PICC. A blood clot forming in a vein (deep vein thrombosis) or traveling to the lung (pulmonary embolism). Inflammation of the vein (phlebitis) in which the PICC is placed. Infection at the insertion site or in the blood. Blood infections from central lines, like PICCs, can be serious and often require a hospital stay. PICC malposition, or PICC movement or poor placement. A break or cut in the PICC. Do not use scissors near the PICC. Nerve or tendon irritation or injury during PICC insertion. How to care for your PICC Please follow the specific guidelines provided by your health care provider. Preventing infection You and any caregivers should wash your hands often with soap and water for at least 20 seconds. Wash hands: Before touching the PICC or the infusion device. Before changing a  bandage (dressing). Do not change the dressing unless you have been taught to do so and have shown you are able to change it safely. Flush the PICC as told. Tell your health care provider right away if the PICC is hard to flush or does not flush. Do not use force to flush the PICC. Use clean and germ-free (sterile) supplies only. Keep the supplies in a dry place. Do not reuse needles, syringes, or any other supplies. Reusing supplies can lead to infection. Keep the PICC dressing dry and secure it with tape if the edges stop sticking to your skin. Check your PICC insertion site every day for signs of infection. Check for: Redness, swelling, or pain. Fluid or blood. Warmth. Pus or a bad smell. Preventing other problems Do not use a syringe that is less than 10 mL to flush the PICC. Do not have your blood pressure checked on the arm in which the PICC is placed. Do not ever pull or tug on the PICC. Keep it secured to your arm with tape or a stretch wrap when not in use. Do not take the PICC out yourself. Only a trained health care provider should remove the PICC. Keep pets and children away from your PICC. How to care for your PICC dressing Keep your PICC dressing clean and dry to prevent infection. Do not take baths, swim, or use a hot tub until your health care provider approves. Ask your health care provider if you can take  showers. You may only be allowed to take sponge baths. When you are allowed to shower: Ask your health care provider to teach you how to wrap the PICC. Cover the PICC with clear plastic wrap and tape to keep it dry while showering. Follow instructions from your health care provider about how to take care of your insertion site and dressing. Make sure you: Wash your hands with soap and water for at least 20 seconds before and after you change your dressing. If soap and water are not available, use hand sanitizer. Change your dressing only if taught to do so by your health care  provider. Your PICC dressing needs to be changed if it becomes loose or wet. Leave stitches (sutures), skin glue, or adhesive strips in place. These skin closures may need to stay in place for 2 weeks or longer. If adhesive strip edges start to loosen and curl up, you may trim the loose edges. Do not remove adhesive strips completely unless your health care provider tells you to do that. Follow these instructions at home: Disposal of supplies Throw away any syringes in a disposal container that is meant for sharp items (sharps container). You can buy a sharps container from a pharmacy, or you can make one by using an empty, hard plastic bottle with a lid. Place any used dressings or infusion bags into a plastic bag. Throw that bag in the trash. General instructions  Always carry your PICC identification card or wear a medical alert bracelet. Keep the tube clamped at all times, unless it is being used. Always carry a smooth-edge clamp with you to clamp the PICC if it breaks. Do not use scissors or sharp objects near the tube. You may bend your arm and move it freely. If your PICC is near or at the bend of your elbow, avoid activity with repeated motion at the elbow. Avoid lifting heavy objects as told by your health care provider. Keep all follow-up visits. This is important. You will need to have your PICC dressing changed at least once a week. Contact a health care provider if: You have pain in your arm, ear, face, or teeth. You have a fever or chills. You have redness, swelling, or pain around the insertion site. You have fluid or blood coming from the insertion site. Your insertion site feels warm to the touch. You have pus or a bad smell coming from the insertion site. Your skin feels hard and raised around the insertion site. Your PICC dressing has gotten wet or is coming off and you have not been taught how to change it. Get help right away if: You have problems with your PICC, such as  your PICC: Was tugged or pulled and has partially come out. Do not  push the PICC back in. Cannot be flushed, is hard to flush, or leaks around the insertion site when it is flushed. Makes a flushing sound when it is flushed. Appears to have a hole or tear. Is accidentally pulled all the way out. If this happens, cover the insertion site with a gauze dressing. Do not throw the PICC away. Your health care provider will need to check it to be sure the entire catheter came out. You feel your heart racing or skipping beats, or you have chest pain. You have shortness of breath or trouble breathing. You have swelling, redness, warmth, or pain in the arm in which the PICC is placed. You have a red streak going up your arm that  starts under the PICC dressing. These symptoms may be an emergency. Get help right away. Call 911. Do not wait to see if the symptoms will go away. Do not drive yourself to the hospital. Summary A peripherally inserted central catheter (PICC) is a long, thin, flexible tube (catheter) that is put into a vein in the arm or leg. If cared for properly, a PICC can remain in place for many months. Having a PICC can allow you to go home from the hospital sooner and continue treatment at home. The PICC is inserted using a germ-free (sterile) technique by a specially trained health care provider. Only a trained health care provider should remove it. Do not have your blood pressure checked on the arm in which your PICC is placed. Always keep your PICC identification card with you. This information is not intended to replace advice given to you by your health care provider. Make sure you discuss any questions you have with your health care provider. Document Revised: 07/01/2021 Document Reviewed: 07/01/2021 Elsevier Patient Education  2024 [employer].

## 2023-08-02 NOTE — Progress Notes (Signed)
Pt here for dressing line change. Pt has appts on 08/03/23.

## 2023-08-03 ENCOUNTER — Inpatient Hospital Stay: Payer: Medicare PPO

## 2023-08-09 ENCOUNTER — Ambulatory Visit (HOSPITAL_COMMUNITY)
Admission: RE | Admit: 2023-08-09 | Discharge: 2023-08-09 | Disposition: A | Discharge status: Home / Self Care | Payer: Medicare PPO | Source: Ambulatory Visit | Attending: Hematology & Oncology | Admitting: Hematology & Oncology

## 2023-08-09 DIAGNOSIS — C50919 Malignant neoplasm of unspecified site of unspecified female breast: Secondary | ICD-10-CM | POA: Diagnosis not present

## 2023-08-09 DIAGNOSIS — C50911 Malignant neoplasm of unspecified site of right female breast: Secondary | ICD-10-CM | POA: Diagnosis not present

## 2023-08-09 DIAGNOSIS — C7951 Secondary malignant neoplasm of bone: Secondary | ICD-10-CM | POA: Insufficient documentation

## 2023-08-09 LAB — GLUCOSE, CAPILLARY: Glucose-Capillary: 97 mg/dL (ref 70–99)

## 2023-08-09 MED ORDER — FLUDEOXYGLUCOSE F - 18 (FDG) INJECTION
8.0000 | Freq: Once | INTRAVENOUS | Status: AC | PRN
  Administered 2023-08-09: 7.74 via INTRAVENOUS

## 2023-08-10 ENCOUNTER — Inpatient Hospital Stay: Payer: Medicare PPO

## 2023-08-10 VITALS — BP 108/48 | HR 77 | Temp 97.4°F

## 2023-08-10 DIAGNOSIS — R232 Flushing: Secondary | ICD-10-CM | POA: Diagnosis not present

## 2023-08-10 DIAGNOSIS — Z5111 Encounter for antineoplastic chemotherapy: Secondary | ICD-10-CM | POA: Diagnosis not present

## 2023-08-10 DIAGNOSIS — R5383 Other fatigue: Secondary | ICD-10-CM | POA: Diagnosis not present

## 2023-08-10 DIAGNOSIS — C771 Secondary and unspecified malignant neoplasm of intrathoracic lymph nodes: Secondary | ICD-10-CM | POA: Diagnosis not present

## 2023-08-10 DIAGNOSIS — R110 Nausea: Secondary | ICD-10-CM | POA: Diagnosis not present

## 2023-08-10 DIAGNOSIS — Z170 Estrogen receptor positive status [ER+]: Secondary | ICD-10-CM | POA: Diagnosis not present

## 2023-08-10 DIAGNOSIS — C50911 Malignant neoplasm of unspecified site of right female breast: Secondary | ICD-10-CM | POA: Diagnosis not present

## 2023-08-10 DIAGNOSIS — K5900 Constipation, unspecified: Secondary | ICD-10-CM | POA: Diagnosis not present

## 2023-08-10 DIAGNOSIS — C50919 Malignant neoplasm of unspecified site of unspecified female breast: Secondary | ICD-10-CM

## 2023-08-10 DIAGNOSIS — C7951 Secondary malignant neoplasm of bone: Secondary | ICD-10-CM | POA: Diagnosis not present

## 2023-08-10 MED ORDER — SODIUM CHLORIDE 0.9% FLUSH
10.0000 mL | INTRAVENOUS | Status: AC | PRN
  Administered 2023-08-10: 10 mL

## 2023-08-10 MED ORDER — HEPARIN SOD (PORK) LOCK FLUSH 100 UNIT/ML IV SOLN
250.0000 [IU] | INTRAVENOUS | Status: AC | PRN
  Administered 2023-08-10: 250 [IU]

## 2023-08-12 ENCOUNTER — Encounter: Payer: Self-pay | Admitting: Hematology & Oncology

## 2023-08-15 ENCOUNTER — Telehealth: Payer: Self-pay | Admitting: *Deleted

## 2023-08-15 ENCOUNTER — Other Ambulatory Visit (HOSPITAL_BASED_OUTPATIENT_CLINIC_OR_DEPARTMENT_OTHER): Payer: Self-pay

## 2023-08-15 NOTE — Telephone Encounter (Signed)
-----   Message from Josph Macho sent at 08/15/2023  1:04 PM EDT ----- Please call and let her know that the PET scan looks okay.  Everything seems to be working so far.  There is nothing that looks like there is progressing with her breast cancer.  Thanks.  Cindee Lame

## 2023-08-16 ENCOUNTER — Encounter: Payer: Self-pay | Admitting: *Deleted

## 2023-08-16 DIAGNOSIS — H3581 Retinal edema: Secondary | ICD-10-CM | POA: Diagnosis not present

## 2023-08-16 NOTE — Progress Notes (Signed)
Reviewed PET which shows stable disease.   Oncology Nurse Navigator Documentation     08/16/2023    7:45 AM  Oncology Nurse Navigator Flowsheets  Navigator Follow Up Date: 08/17/2023  Navigator Follow Up Reason: Follow-up Appointment  Navigator Location [facility]  Navigator Encounter Type Scan Review  Patient Visit Type MedOnc  Treatment Phase Active Tx  Barriers/Navigation Needs No Barriers At This Time  Interventions None Required  Acuity Level 1-No Barriers  Support Groups/Services Friends and Family  Time Spent with Patient 15

## 2023-08-17 ENCOUNTER — Inpatient Hospital Stay: Payer: Medicare PPO

## 2023-08-17 ENCOUNTER — Inpatient Hospital Stay: Payer: Medicare PPO | Admitting: Hematology & Oncology

## 2023-08-17 ENCOUNTER — Encounter: Payer: Self-pay | Admitting: *Deleted

## 2023-08-17 ENCOUNTER — Other Ambulatory Visit: Payer: Self-pay

## 2023-08-17 ENCOUNTER — Encounter: Payer: Self-pay | Admitting: Hematology & Oncology

## 2023-08-17 VITALS — BP 113/52 | HR 72 | Temp 98.0°F | Resp 17 | Wt 158.0 lb

## 2023-08-17 DIAGNOSIS — C50919 Malignant neoplasm of unspecified site of unspecified female breast: Secondary | ICD-10-CM

## 2023-08-17 DIAGNOSIS — K5900 Constipation, unspecified: Secondary | ICD-10-CM | POA: Diagnosis not present

## 2023-08-17 DIAGNOSIS — R110 Nausea: Secondary | ICD-10-CM | POA: Diagnosis not present

## 2023-08-17 DIAGNOSIS — C771 Secondary and unspecified malignant neoplasm of intrathoracic lymph nodes: Secondary | ICD-10-CM | POA: Diagnosis not present

## 2023-08-17 DIAGNOSIS — C7951 Secondary malignant neoplasm of bone: Secondary | ICD-10-CM | POA: Diagnosis not present

## 2023-08-17 DIAGNOSIS — R5383 Other fatigue: Secondary | ICD-10-CM | POA: Diagnosis not present

## 2023-08-17 DIAGNOSIS — Z170 Estrogen receptor positive status [ER+]: Secondary | ICD-10-CM | POA: Diagnosis not present

## 2023-08-17 DIAGNOSIS — R232 Flushing: Secondary | ICD-10-CM | POA: Diagnosis not present

## 2023-08-17 DIAGNOSIS — C50911 Malignant neoplasm of unspecified site of right female breast: Secondary | ICD-10-CM

## 2023-08-17 DIAGNOSIS — Z5111 Encounter for antineoplastic chemotherapy: Secondary | ICD-10-CM | POA: Diagnosis not present

## 2023-08-17 LAB — CMP (CANCER CENTER ONLY)
ALT: 11 U/L (ref 0–44)
AST: 19 U/L (ref 15–41)
Albumin: 3 g/dL — ABNORMAL LOW (ref 3.5–5.0)
Alkaline Phosphatase: 73 U/L (ref 38–126)
Anion gap: 8 (ref 5–15)
BUN: 21 mg/dL (ref 8–23)
CO2: 25 mmol/L (ref 22–32)
Calcium: 8.5 mg/dL — ABNORMAL LOW (ref 8.9–10.3)
Chloride: 103 mmol/L (ref 98–111)
Creatinine: 1.33 mg/dL — ABNORMAL HIGH (ref 0.44–1.00)
GFR, Estimated: 43 mL/min — ABNORMAL LOW (ref >60)
Glucose, Bld: 118 mg/dL — ABNORMAL HIGH (ref 70–99)
Potassium: 4 mmol/L (ref 3.5–5.1)
Sodium: 136 mmol/L (ref 135–145)
Total Bilirubin: 0.5 mg/dL (ref 0.3–1.2)
Total Protein: 6.2 g/dL — ABNORMAL LOW (ref 6.5–8.1)

## 2023-08-17 LAB — CBC WITH DIFFERENTIAL (CANCER CENTER ONLY)
Abs Immature Granulocytes: 0.02 10*3/uL (ref 0.00–0.07)
Basophils Absolute: 0 10*3/uL (ref 0.0–0.1)
Basophils Relative: 1 %
Eosinophils Absolute: 0 10*3/uL (ref 0.0–0.5)
Eosinophils Relative: 2 %
HCT: 25.2 % — ABNORMAL LOW (ref 36.0–46.0)
Hemoglobin: 8.9 g/dL — ABNORMAL LOW (ref 12.0–15.0)
Immature Granulocytes: 1 %
Lymphocytes Relative: 13 %
Lymphs Abs: 0.2 10*3/uL — ABNORMAL LOW (ref 0.7–4.0)
MCH: 42.4 pg — ABNORMAL HIGH (ref 26.0–34.0)
MCHC: 35.3 g/dL (ref 30.0–36.0)
MCV: 120 fL — ABNORMAL HIGH (ref 80.0–100.0)
Monocytes Absolute: 0.2 10*3/uL (ref 0.1–1.0)
Monocytes Relative: 11 %
Neutro Abs: 1.2 10*3/uL — ABNORMAL LOW (ref 1.7–7.7)
Neutrophils Relative %: 72 %
Platelet Count: 213 10*3/uL (ref 150–400)
RBC: 2.1 MIL/uL — ABNORMAL LOW (ref 3.87–5.11)
RDW: 14 % (ref 11.5–15.5)
WBC Count: 1.7 10*3/uL — ABNORMAL LOW (ref 4.0–10.5)
nRBC: 0 % (ref 0.0–0.2)

## 2023-08-17 LAB — FERRITIN: Ferritin: 306 ng/mL (ref 11–307)

## 2023-08-17 LAB — TSH: TSH: 2.695 u[IU]/mL (ref 0.350–4.500)

## 2023-08-17 MED ORDER — SODIUM CHLORIDE 0.9 % IV SOLN: Freq: Once | INTRAVENOUS | Status: AC

## 2023-08-17 MED ORDER — ZOLEDRONIC ACID 4 MG/100ML IV SOLN
4.0000 mg | Freq: Once | INTRAVENOUS | Status: AC
  Administered 2023-08-17: 4 mg via INTRAVENOUS
  Filled 2023-08-17: qty 100

## 2023-08-17 MED ORDER — FULVESTRANT 250 MG/5ML IM SOSY
500.0000 mg | PREFILLED_SYRINGE | Freq: Once | INTRAMUSCULAR | Status: AC
  Administered 2023-08-17: 500 mg via INTRAMUSCULAR

## 2023-08-17 MED ORDER — RIBOCICLIB SUCC (600 MG DOSE) 200 MG PO TBPK: 400.0000 mg | ORAL_TABLET | Freq: Every day | ORAL | Status: DC

## 2023-08-17 NOTE — Progress Notes (Signed)
Hematology and Oncology Follow Up Visit  AVANTHIKA IRVAN [id number] May 06, 1951 72 y.o. 08/17/2023   Principle Diagnosis:  Metastatic breast cancer-bone metastasis/hilar lymph node metastasis- ER+/PR+/HER-2 (2+) -- PIK3CA (+)  Current Therapy:   Faslodex 500 mg IM monthly -start on 11/09/2022 Ribociclib 600 mg p.o. daily (21/7) --start on 11/09/2022 --changed to 40 mg p.o. daily started on 09/05/2023 Zometa 4 mg IV every 3 months -next dose on 10/2023 Radiation therapy to left hip -completed in 11/23/2022     Interim History:  Ms. Cromley is back for follow-up.  She is doing pretty well.  She has this centralized and now.  It is not a Port-A-Cath.  It has to be flushed every week.  She did have a PET scan that was done.  This was done recently.  This was done on 08/09/2023.  This did not show any evidence that the breast has had progressed.  However, there was an area in the left upper lobe measuring 1.2 x 0.7 cm that had low-level activity.  I think we will just watch this.  Her last CA 27-29 was down to 43.  She has had no problems with nausea or vomiting.  Has been no change in bowel or bladder habits.  She has had a little bit of leg swelling, which is chronic.  There is been no issues with fever.  She has had no headache.  Overall, I would say that her performance status is probably ECOG 1.  .     Medications:  Current Outpatient Medications:    amLODipine (NORVASC) 2.5 MG tablet, Take 1 pill a day., Disp: 90 tablet, Rfl: 1   aspirin 81 MG tablet, Take 81 mg by mouth daily., Disp: , Rfl:    cholecalciferol (VITAMIN D) 1000 units tablet, Take 1,000 Units by mouth daily., Disp: , Rfl:    Heparin Na, Pork, Lock Flsh PF 100 UNIT/ML SOLN, Inject 2.5 mLs (250 Units total) into the vein every 12 (twelve) hours., Disp: 300 mL, Rfl: 3   hydrochlorothiazide (HYDRODIURIL) 25 MG tablet, TAKE 1 TABLET(25 MG) BY MOUTH DAILY, Disp: 90 tablet, Rfl: 1   meclizine (ANTIVERT) 12.5 MG tablet, Take 1  tablet (12.5 mg total) by mouth 3 (three) times daily as needed for dizziness., Disp: 30 tablet, Rfl: 4   OLANZapine (ZYPREXA) 5 MG tablet, TAKE 1 TABLET(5 MG) BY MOUTH AT BEDTIME, Disp: 30 tablet, Rfl: 3   ondansetron (ZOFRAN) 8 MG tablet, TAKE 1 TABLET(8 MG) BY MOUTH EVERY 8 HOURS AS NEEDED FOR NAUSEA OR VOMITING, Disp: 30 tablet, Rfl: 2   potassium chloride SA (KLOR-CON M) 20 MEQ tablet, Take 1 tablet (20 mEq total) by mouth 2 (two) times daily., Disp: 60 tablet, Rfl: 2   ribociclib succ (KISQALI 600MG  DAILY DOSE) 200 MG Therapy Pack, Take 3 tablets (600 mg total) by mouth daily. Take for 21 days on, 7 days off, repeat every 28 days., Disp: 63 tablet, Rfl: 6   sodium chloride flush 0.9 % SOLN injection, Inject 10 mLs into the vein every 12 (twelve) hours., Disp: 600 mL, Rfl: 3   traMADol (ULTRAM) 50 MG tablet, Take 1 tablet (50 mg total) by mouth every 6 (six) hours as needed (mild pain)., Disp: 10 tablet, Rfl: 0   zoledronic acid (ZOMETA) 4 MG/5ML injection, Inject 4 mg into the vein every 3 (three) months., Disp: , Rfl:    bisoprolol-hydrochlorothiazide (ZIAC) 10-6.25 MG tablet, Take 1 tablet by mouth daily. (Patient not taking: Reported on 08/17/2023), Disp: 90  tablet, Rfl: 1  Allergies: No Known Allergies  Past Medical History, Surgical history, Social history, and Family History were reviewed and updated.  Review of Systems: Review of Systems  Constitutional:  Positive for fatigue.  HENT:  Negative.    Eyes: Negative.   Respiratory: Negative.    Cardiovascular: Negative.   Gastrointestinal:  Positive for constipation and nausea.  Endocrine: Negative.   Genitourinary: Negative.    Musculoskeletal:  Positive for arthralgias and myalgias.  Skin: Negative.   Neurological: Negative.   Hematological: Negative.   Psychiatric/Behavioral: Negative.      Physical Exam: Vital signs show temperature of 98 4.4.  Pulse 100.  Blood pressure 156/92.  Weight is 167 pounds.  Wt Readings from  Last 3 Encounters:  08/17/23 158 lb (71.7 kg)  07/20/23 155 lb 0.6 oz (70.3 kg)  06/22/23 158 lb (71.7 kg)    Physical Exam Vitals reviewed.  Constitutional:      Comments: Left breast shows no masses, edema or erythema.  There is no left axillary adenopathy.  Her right chest wall shows a well-healing mastectomy.  There is no erythema or warmth.  There is no right axillary adenopathy.  Marland Kitchen  HENT:     Head: Normocephalic and atraumatic.  Eyes:     Pupils: Pupils are equal, round, and reactive to light.  Cardiovascular:     Rate and Rhythm: Normal rate and regular rhythm.     Heart sounds: Normal heart sounds.  Pulmonary:     Effort: Pulmonary effort is normal.     Breath sounds: Normal breath sounds.  Abdominal:     General: Bowel sounds are normal.     Palpations: Abdomen is soft.  Musculoskeletal:        General: No tenderness or deformity. Normal range of motion.     Cervical back: Normal range of motion.  Lymphadenopathy:     Cervical: No cervical adenopathy.  Skin:    General: Skin is warm and dry.     Findings: No erythema or rash.  Neurological:     Mental Status: She is alert and oriented to person, place, and time.  Psychiatric:        Behavior: Behavior normal.        Thought Content: Thought content normal.        Judgment: Judgment normal.     Lab Results  Component Value Date   WBC 1.7 (L) 08/17/2023   HGB 8.9 (L) 08/17/2023   HCT 25.2 (L) 08/17/2023   MCV 120.0 (H) 08/17/2023   PLT 213 08/17/2023     Chemistry      Component Value Date/Time   NA 136 08/17/2023 1210   K 4.0 08/17/2023 1210   CL 103 08/17/2023 1210   CO2 25 08/17/2023 1210   BUN 21 08/17/2023 1210   CREATININE 1.33 (H) 08/17/2023 1210      Component Value Date/Time   CALCIUM 8.5 (L) 08/17/2023 1210   ALKPHOS 73 08/17/2023 1210   AST 19 08/17/2023 1210   ALT 11 08/17/2023 1210   BILITOT 0.5 08/17/2023 1210       Impression and Plan: Ms. Edenburn is a very nice 72 year old  white female.  She has metastatic breast cancer.  We have her on antiestrogen therapy.  It will be interesting to see what her CA 27-29 is.  Hopefully, this will be down a little bit more.  She does need her Zometa today.  I think this is can be very helpful for  her.  Again, she is going to have to hold the ribociclib today in my opinion.  I told her that we probably will have her change dosing to 400 mg.  I think this would be helpful for her blood counts.  I will plan to get her back in another month.    Josph Macho, MD 8/21/20241:04 PM

## 2023-08-17 NOTE — Patient Instructions (Signed)
PICC Home Care Guide A peripherally inserted central catheter (PICC) is a form of IV access that allows medicines and IV fluids to be quickly put into the blood and spread throughout the body. The PICC is a long, thin, flexible tube (catheter) that is put into a vein in a person's arm or leg. The catheter ends in a large vein just outside the heart called the superior vena cava (SVC). After the PICC is put in, a chest X-ray may be done to make sure that it is in the right place. A PICC may be placed for different reasons, such as: To give medicines and liquid nutrition. To give IV fluids and blood products. To take blood samples often. If there is trouble placing a peripheral intravenous (PIV) catheter. If cared for properly, a PICC can remain in place for many months. Having a PICC can allow you to go home from the hospital sooner and continue treatment at home. Medicines and PICC care can be managed at home by a family member, caregiver, or home health care team. What are the risks? Generally, having a PICC is safe. However, problems may occur, including: A blood clot (thrombus) forming in or at the end of the PICC. A blood clot forming in a vein (deep vein thrombosis) or traveling to the lung (pulmonary embolism). Inflammation of the vein (phlebitis) in which the PICC is placed. Infection at the insertion site or in the blood. Blood infections from central lines, like PICCs, can be serious and often require a hospital stay. PICC malposition, or PICC movement or poor placement. A break or cut in the PICC. Do not use scissors near the PICC. Nerve or tendon irritation or injury during PICC insertion. How to care for your PICC Please follow the specific guidelines provided by your health care provider. Preventing infection You and any caregivers should wash your hands often with soap and water for at least 20 seconds. Wash hands: Before touching the PICC or the infusion device. Before changing a  bandage (dressing). Do not change the dressing unless you have been taught to do so and have shown you are able to change it safely. Flush the PICC as told. Tell your health care provider right away if the PICC is hard to flush or does not flush. Do not use force to flush the PICC. Use clean and germ-free (sterile) supplies only. Keep the supplies in a dry place. Do not reuse needles, syringes, or any other supplies. Reusing supplies can lead to infection. Keep the PICC dressing dry and secure it with tape if the edges stop sticking to your skin. Check your PICC insertion site every day for signs of infection. Check for: Redness, swelling, or pain. Fluid or blood. Warmth. Pus or a bad smell. Preventing other problems Do not use a syringe that is less than 10 mL to flush the PICC. Do not have your blood pressure checked on the arm in which the PICC is placed. Do not ever pull or tug on the PICC. Keep it secured to your arm with tape or a stretch wrap when not in use. Do not take the PICC out yourself. Only a trained health care provider should remove the PICC. Keep pets and children away from your PICC. How to care for your PICC dressing Keep your PICC dressing clean and dry to prevent infection. Do not take baths, swim, or use a hot tub until your health care provider approves. Ask your health care provider if you can take  showers. You may only be allowed to take sponge baths. When you are allowed to shower: Ask your health care provider to teach you how to wrap the PICC. Cover the PICC with clear plastic wrap and tape to keep it dry while showering. Follow instructions from your health care provider about how to take care of your insertion site and dressing. Make sure you: Wash your hands with soap and water for at least 20 seconds before and after you change your dressing. If soap and water are not available, use hand sanitizer. Change your dressing only if taught to do so by your health care  provider. Your PICC dressing needs to be changed if it becomes loose or wet. Leave stitches (sutures), skin glue, or adhesive strips in place. These skin closures may need to stay in place for 2 weeks or longer. If adhesive strip edges start to loosen and curl up, you may trim the loose edges. Do not remove adhesive strips completely unless your health care provider tells you to do that. Follow these instructions at home: Disposal of supplies Throw away any syringes in a disposal container that is meant for sharp items (sharps container). You can buy a sharps container from a pharmacy, or you can make one by using an empty, hard plastic bottle with a lid. Place any used dressings or infusion bags into a plastic bag. Throw that bag in the trash. General instructions  Always carry your PICC identification card or wear a medical alert bracelet. Keep the tube clamped at all times, unless it is being used. Always carry a smooth-edge clamp with you to clamp the PICC if it breaks. Do not use scissors or sharp objects near the tube. You may bend your arm and move it freely. If your PICC is near or at the bend of your elbow, avoid activity with repeated motion at the elbow. Avoid lifting heavy objects as told by your health care provider. Keep all follow-up visits. This is important. You will need to have your PICC dressing changed at least once a week. Contact a health care provider if: You have pain in your arm, ear, face, or teeth. You have a fever or chills. You have redness, swelling, or pain around the insertion site. You have fluid or blood coming from the insertion site. Your insertion site feels warm to the touch. You have pus or a bad smell coming from the insertion site. Your skin feels hard and raised around the insertion site. Your PICC dressing has gotten wet or is coming off and you have not been taught how to change it. Get help right away if: You have problems with your PICC, such as  your PICC: Was tugged or pulled and has partially come out. Do not  push the PICC back in. Cannot be flushed, is hard to flush, or leaks around the insertion site when it is flushed. Makes a flushing sound when it is flushed. Appears to have a hole or tear. Is accidentally pulled all the way out. If this happens, cover the insertion site with a gauze dressing. Do not throw the PICC away. Your health care provider will need to check it to be sure the entire catheter came out. You feel your heart racing or skipping beats, or you have chest pain. You have shortness of breath or trouble breathing. You have swelling, redness, warmth, or pain in the arm in which the PICC is placed. You have a red streak going up your arm that  starts under the PICC dressing. These symptoms may be an emergency. Get help right away. Call 911. Do not wait to see if the symptoms will go away. Do not drive yourself to the hospital. Summary A peripherally inserted central catheter (PICC) is a long, thin, flexible tube (catheter) that is put into a vein in the arm or leg. If cared for properly, a PICC can remain in place for many months. Having a PICC can allow you to go home from the hospital sooner and continue treatment at home. The PICC is inserted using a germ-free (sterile) technique by a specially trained health care provider. Only a trained health care provider should remove it. Do not have your blood pressure checked on the arm in which your PICC is placed. Always keep your PICC identification card with you. This information is not intended to replace advice given to you by your health care provider. Make sure you discuss any questions you have with your health care provider. Document Revised: 07/01/2021 Document Reviewed: 07/01/2021 Elsevier Patient Education  2024 [employer].

## 2023-08-17 NOTE — Patient Instructions (Signed)
 Zoledronic Acid Injection (Cancer) What is this medication? ZOLEDRONIC ACID (ZOE le dron ik AS id) treats high calcium levels in the blood caused by cancer. It may also be used with chemotherapy to treat weakened bones caused by cancer. It works by slowing down the release of calcium from bones. This lowers calcium levels in your blood. It also makes your bones stronger and less likely to break (fracture). It belongs to a group of medications called bisphosphonates. This medicine may be used for other purposes; ask your health care provider or pharmacist if you have questions. COMMON BRAND NAME(S): Zometa, Zometa Powder What should I tell my care team before I take this medication? They need to know if you have any of these conditions: Dehydration Dental disease Kidney disease Liver disease Low levels of calcium in the blood Lung or breathing disease, such as asthma Receiving steroids, such as dexamethasone or prednisone An unusual or allergic reaction to zoledronic acid, other medications, foods, dyes, or preservatives Pregnant or trying to get pregnant Breast-feeding How should I use this medication? This medication is injected into a vein. It is given by your care team in a hospital or clinic setting. Talk to your care team about the use of this medication in children. Special care may be needed. Overdosage: If you think you have taken too much of this medicine contact a poison control center or emergency room at once. NOTE: This medicine is only for you. Do not share this medicine with others. What if I miss a dose? Keep appointments for follow-up doses. It is important not to miss your dose. Call your care team if you are unable to keep an appointment. What may interact with this medication? Certain antibiotics given by injection Diuretics, such as bumetanide, furosemide NSAIDs, medications for pain and inflammation, such as ibuprofen or naproxen Teriparatide Thalidomide This list  may not describe all possible interactions. Give your health care provider a list of all the medicines, herbs, non-prescription drugs, or dietary supplements you use. Also tell them if you smoke, drink alcohol, or use illegal drugs. Some items may interact with your medicine. What should I watch for while using this medication? Visit your care team for regular checks on your progress. It may be some time before you see the benefit from this medication. Some people who take this medication have severe bone, joint, or muscle pain. This medication may also increase your risk for jaw problems or a broken thigh bone. Tell your care team right away if you have severe pain in your jaw, bones, joints, or muscles. Tell you care team if you have any pain that does not go away or that gets worse. Tell your dentist and dental surgeon that you are taking this medication. You should not have major dental surgery while on this medication. See your dentist to have a dental exam and fix any dental problems before starting this medication. Take good care of your teeth while on this medication. Make sure you see your dentist for regular follow-up appointments. You should make sure you get enough calcium and vitamin D while you are taking this medication. Discuss the foods you eat and the vitamins you take with your care team. Check with your care team if you have severe diarrhea, nausea, and vomiting, or if you sweat a lot. The loss of too much body fluid may make it dangerous for you to take this medication. You may need bloodwork while taking this medication. Talk to your care team if  you wish to become pregnant or think you might be pregnant. This medication can cause serious birth defects. What side effects may I notice from receiving this medication? Side effects that you should report to your care team as soon as possible: Allergic reactions--skin rash, itching, hives, swelling of the face, lips, tongue, or  throat Kidney injury--decrease in the amount of urine, swelling of the ankles, hands, or feet Low calcium level--muscle pain or cramps, confusion, tingling, or numbness in the hands or feet Osteonecrosis of the jaw--pain, swelling, or redness in the mouth, numbness of the jaw, poor healing after dental work, unusual discharge from the mouth, visible bones in the mouth Severe bone, joint, or muscle pain Side effects that usually do not require medical attention (report to your care team if they continue or are bothersome): Constipation Fatigue Fever Loss of appetite Nausea Stomach pain This list may not describe all possible side effects. Call your doctor for medical advice about side effects. You may report side effects to FDA at 1-800-FDA-1088. Where should I keep my medication? This medication is given in a hospital or clinic. It will not be stored at home. NOTE: This sheet is a summary. It may not cover all possible information. If you have questions about this medicine, talk to your doctor, pharmacist, or health care provider.  2024 Elsevier/Gold Standard (2022-02-05 00:00:00) Fulvestrant Injection What is this medication? FULVESTRANT (ful VES trant) treats breast cancer. It works by blocking the hormone estrogen in breast tissue, which prevents breast cancer cells from spreading or growing. This medicine may be used for other purposes; ask your health care provider or pharmacist if you have questions. COMMON BRAND NAME(S): FASLODEX What should I tell my care team before I take this medication? They need to know if you have any of these conditions: Bleeding disorder Liver disease Low blood cell levels, such as low white cells, red cells, and platelets An unusual or allergic reaction to fulvestrant, other medications, foods, dyes, or preservatives Pregnant or trying to get pregnant Breast-feeding How should I use this medication? This medication is injected into a muscle. It is  given by your care team in a hospital or clinic setting. Talk to your care team about the use of this medication in children. Special care may be needed. Overdosage: If you think you have taken too much of this medicine contact a poison control center or emergency room at once. NOTE: This medicine is only for you. Do not share this medicine with others. What if I miss a dose? Keep appointments for follow-up doses. It is important not to miss your dose. Call your care team if you are unable to keep an appointment. What may interact with this medication? Certain medications that prevent or treat blood clots, such as warfarin, enoxaparin, dalteparin, apixaban, dabigatran, rivaroxaban This list may not describe all possible interactions. Give your health care provider a list of all the medicines, herbs, non-prescription drugs, or dietary supplements you use. Also tell them if you smoke, drink alcohol, or use illegal drugs. Some items may interact with your medicine. What should I watch for while using this medication? Your condition will be monitored carefully while you are receiving this medication. You may need blood work while taking this medication. Talk to your care team if you may be pregnant. Serious birth defects can occur if you take this medication during pregnancy and for 1 year after the last dose. You will need a negative pregnancy test before starting this medication. Contraception  is recommended while taking this medication and for 1 year after the last dose. Your care team can help you find the option that works for you. Do not breastfeed while taking this medication and for 1 year after the last dose. This medication may cause infertility. Talk to your care team if you are concerned about your fertility. What side effects may I notice from receiving this medication? Side effects that you should report to your care team as soon as possible: Allergic reactions or angioedema--skin rash,  itching or hives, swelling of the face, eyes, lips, tongue, arms, or legs, trouble swallowing or breathing Pain, tingling, or numbness in the hands or feet Side effects that usually do not require medical attention (report to your care team if they continue or are bothersome): Bone, joint, or muscle pain Constipation Headache Hot flashes Nausea Pain, redness, or irritation at injection site Unusual weakness or fatigue This list may not describe all possible side effects. Call your doctor for medical advice about side effects. You may report side effects to FDA at 1-800-FDA-1088. Where should I keep my medication? This medication is given in a hospital or clinic. It will not be stored at home. NOTE: This sheet is a summary. It may not cover all possible information. If you have questions about this medicine, talk to your doctor, pharmacist, or health care provider.  2024 Elsevier/Gold Standard (2022-04-27 00:00:00)

## 2023-08-17 NOTE — Progress Notes (Signed)
Patient recent re-staging shows good disease control She will hold her Kisqali due to low blood counts.   Oncology Nurse Navigator Documentation     08/17/2023   12:30 PM  Oncology Nurse Navigator Flowsheets  Navigator Follow Up Date: 09/14/2023  Navigator Follow Up Reason: Follow-up Appointment  Navigator Location [facility]  Navigator Encounter Type Treatment;Appt/Treatment Plan Review  Patient Visit Type MedOnc  Treatment Phase Active Tx  Barriers/Navigation Needs No Barriers At This Time  Interventions Psycho-Social Support  Acuity Level 1-No Barriers  Support Groups/Services Friends and Family  Time Spent with Patient 15

## 2023-08-18 ENCOUNTER — Encounter: Payer: Self-pay | Admitting: *Deleted

## 2023-08-18 LAB — IRON AND IRON BINDING CAPACITY (CC-WL,HP ONLY)
Iron: 106 ug/dL (ref 28–170)
Saturation Ratios: 36 % — ABNORMAL HIGH (ref 10.4–31.8)
TIBC: 293 ug/dL (ref 250–450)
UIBC: 187 ug/dL (ref 148–442)

## 2023-08-18 LAB — CANCER ANTIGEN 27.29: CA 27.29: 32.8 U/mL (ref 0.0–38.6)

## 2023-08-22 ENCOUNTER — Other Ambulatory Visit: Payer: Self-pay

## 2023-08-24 ENCOUNTER — Inpatient Hospital Stay: Payer: Medicare PPO

## 2023-08-24 VITALS — BP 120/63 | HR 77 | Temp 98.8°F | Resp 20

## 2023-08-24 DIAGNOSIS — R232 Flushing: Secondary | ICD-10-CM | POA: Diagnosis not present

## 2023-08-24 DIAGNOSIS — K5900 Constipation, unspecified: Secondary | ICD-10-CM | POA: Diagnosis not present

## 2023-08-24 DIAGNOSIS — Z452 Encounter for adjustment and management of vascular access device: Secondary | ICD-10-CM

## 2023-08-24 DIAGNOSIS — R110 Nausea: Secondary | ICD-10-CM | POA: Diagnosis not present

## 2023-08-24 DIAGNOSIS — C7951 Secondary malignant neoplasm of bone: Secondary | ICD-10-CM | POA: Diagnosis not present

## 2023-08-24 DIAGNOSIS — R5383 Other fatigue: Secondary | ICD-10-CM | POA: Diagnosis not present

## 2023-08-24 DIAGNOSIS — C771 Secondary and unspecified malignant neoplasm of intrathoracic lymph nodes: Secondary | ICD-10-CM | POA: Diagnosis not present

## 2023-08-24 DIAGNOSIS — Z170 Estrogen receptor positive status [ER+]: Secondary | ICD-10-CM | POA: Diagnosis not present

## 2023-08-24 DIAGNOSIS — Z5111 Encounter for antineoplastic chemotherapy: Secondary | ICD-10-CM | POA: Diagnosis not present

## 2023-08-24 DIAGNOSIS — C50911 Malignant neoplasm of unspecified site of right female breast: Secondary | ICD-10-CM | POA: Diagnosis not present

## 2023-08-24 MED ORDER — HEPARIN SOD (PORK) LOCK FLUSH 100 UNIT/ML IV SOLN
250.0000 [IU] | Freq: Once | INTRAVENOUS | Status: AC
  Administered 2023-08-24: 250 [IU] via INTRAVENOUS

## 2023-08-24 MED ORDER — SODIUM CHLORIDE 0.9% FLUSH
10.0000 mL | Freq: Once | INTRAVENOUS | Status: AC
  Administered 2023-08-24: 10 mL via INTRAVENOUS

## 2023-08-24 NOTE — Progress Notes (Signed)
Pt. arrived for central line dressing change. Area clean and dry with sutures intact.  No issues reported.

## 2023-08-25 ENCOUNTER — Encounter: Payer: Self-pay | Admitting: Hematology & Oncology

## 2023-08-30 ENCOUNTER — Other Ambulatory Visit: Payer: Self-pay

## 2023-08-31 ENCOUNTER — Inpatient Hospital Stay: Payer: Medicare PPO | Attending: Hematology & Oncology

## 2023-08-31 DIAGNOSIS — Z5189 Encounter for other specified aftercare: Secondary | ICD-10-CM | POA: Insufficient documentation

## 2023-08-31 DIAGNOSIS — C771 Secondary and unspecified malignant neoplasm of intrathoracic lymph nodes: Secondary | ICD-10-CM | POA: Insufficient documentation

## 2023-08-31 DIAGNOSIS — Z452 Encounter for adjustment and management of vascular access device: Secondary | ICD-10-CM | POA: Insufficient documentation

## 2023-08-31 DIAGNOSIS — C7951 Secondary malignant neoplasm of bone: Secondary | ICD-10-CM | POA: Insufficient documentation

## 2023-08-31 DIAGNOSIS — C50911 Malignant neoplasm of unspecified site of right female breast: Secondary | ICD-10-CM | POA: Insufficient documentation

## 2023-08-31 DIAGNOSIS — Z5111 Encounter for antineoplastic chemotherapy: Secondary | ICD-10-CM | POA: Insufficient documentation

## 2023-08-31 DIAGNOSIS — Z170 Estrogen receptor positive status [ER+]: Secondary | ICD-10-CM | POA: Insufficient documentation

## 2023-09-06 ENCOUNTER — Other Ambulatory Visit: Payer: Self-pay

## 2023-09-07 ENCOUNTER — Inpatient Hospital Stay: Payer: Medicare PPO

## 2023-09-07 VITALS — BP 123/64 | HR 76 | Temp 97.5°F | Resp 18

## 2023-09-07 DIAGNOSIS — Z452 Encounter for adjustment and management of vascular access device: Secondary | ICD-10-CM | POA: Diagnosis not present

## 2023-09-07 DIAGNOSIS — C771 Secondary and unspecified malignant neoplasm of intrathoracic lymph nodes: Secondary | ICD-10-CM | POA: Diagnosis not present

## 2023-09-07 DIAGNOSIS — C50911 Malignant neoplasm of unspecified site of right female breast: Secondary | ICD-10-CM | POA: Diagnosis not present

## 2023-09-07 DIAGNOSIS — C50919 Malignant neoplasm of unspecified site of unspecified female breast: Secondary | ICD-10-CM

## 2023-09-07 DIAGNOSIS — Z170 Estrogen receptor positive status [ER+]: Secondary | ICD-10-CM | POA: Diagnosis not present

## 2023-09-07 DIAGNOSIS — C7951 Secondary malignant neoplasm of bone: Secondary | ICD-10-CM | POA: Diagnosis not present

## 2023-09-07 DIAGNOSIS — Z5111 Encounter for antineoplastic chemotherapy: Secondary | ICD-10-CM | POA: Diagnosis not present

## 2023-09-07 DIAGNOSIS — Z5189 Encounter for other specified aftercare: Secondary | ICD-10-CM | POA: Diagnosis not present

## 2023-09-07 MED ORDER — SODIUM CHLORIDE 0.9% FLUSH
10.0000 mL | Freq: Once | INTRAVENOUS | Status: AC | PRN
  Administered 2023-09-07: 10 mL

## 2023-09-07 MED ORDER — HEPARIN SOD (PORK) LOCK FLUSH 100 UNIT/ML IV SOLN
500.0000 [IU] | Freq: Once | INTRAVENOUS | Status: AC | PRN
  Administered 2023-09-07: 500 [IU]

## 2023-09-07 NOTE — Patient Instructions (Signed)
PICC Home Care Guide A peripherally inserted central catheter (PICC) is a form of IV access that allows medicines and IV fluids to be quickly put into the blood and spread throughout the body. The PICC is a long, thin, flexible tube (catheter) that is put into a vein in a person's arm or leg. The catheter ends in a large vein just outside the heart called the superior vena cava (SVC). After the PICC is put in, a chest X-ray may be done to make sure that it is in the right place. A PICC may be placed for different reasons, such as: To give medicines and liquid nutrition. To give IV fluids and blood products. To take blood samples often. If there is trouble placing a peripheral intravenous (PIV) catheter. If cared for properly, a PICC can remain in place for many months. Having a PICC can allow you to go home from the hospital sooner and continue treatment at home. Medicines and PICC care can be managed at home by a family member, caregiver, or home health care team. What are the risks? Generally, having a PICC is safe. However, problems may occur, including: A blood clot (thrombus) forming in or at the end of the PICC. A blood clot forming in a vein (deep vein thrombosis) or traveling to the lung (pulmonary embolism). Inflammation of the vein (phlebitis) in which the PICC is placed. Infection at the insertion site or in the blood. Blood infections from central lines, like PICCs, can be serious and often require a hospital stay. PICC malposition, or PICC movement or poor placement. A break or cut in the PICC. Do not use scissors near the PICC. Nerve or tendon irritation or injury during PICC insertion. How to care for your PICC Please follow the specific guidelines provided by your health care provider. Preventing infection You and any caregivers should wash your hands often with soap and water for at least 20 seconds. Wash hands: Before touching the PICC or the infusion device. Before changing a  bandage (dressing). Do not change the dressing unless you have been taught to do so and have shown you are able to change it safely. Flush the PICC as told. Tell your health care provider right away if the PICC is hard to flush or does not flush. Do not use force to flush the PICC. Use clean and germ-free (sterile) supplies only. Keep the supplies in a dry place. Do not reuse needles, syringes, or any other supplies. Reusing supplies can lead to infection. Keep the PICC dressing dry and secure it with tape if the edges stop sticking to your skin. Check your PICC insertion site every day for signs of infection. Check for: Redness, swelling, or pain. Fluid or blood. Warmth. Pus or a bad smell. Preventing other problems Do not use a syringe that is less than 10 mL to flush the PICC. Do not have your blood pressure checked on the arm in which the PICC is placed. Do not ever pull or tug on the PICC. Keep it secured to your arm with tape or a stretch wrap when not in use. Do not take the PICC out yourself. Only a trained health care provider should remove the PICC. Keep pets and children away from your PICC. How to care for your PICC dressing Keep your PICC dressing clean and dry to prevent infection. Do not take baths, swim, or use a hot tub until your health care provider approves. Ask your health care provider if you can take  showers. You may only be allowed to take sponge baths. When you are allowed to shower: Ask your health care provider to teach you how to wrap the PICC. Cover the PICC with clear plastic wrap and tape to keep it dry while showering. Follow instructions from your health care provider about how to take care of your insertion site and dressing. Make sure you: Wash your hands with soap and water for at least 20 seconds before and after you change your dressing. If soap and water are not available, use hand sanitizer. Change your dressing only if taught to do so by your health care  provider. Your PICC dressing needs to be changed if it becomes loose or wet. Leave stitches (sutures), skin glue, or adhesive strips in place. These skin closures may need to stay in place for 2 weeks or longer. If adhesive strip edges start to loosen and curl up, you may trim the loose edges. Do not remove adhesive strips completely unless your health care provider tells you to do that. Follow these instructions at home: Disposal of supplies Throw away any syringes in a disposal container that is meant for sharp items (sharps container). You can buy a sharps container from a pharmacy, or you can make one by using an empty, hard plastic bottle with a lid. Place any used dressings or infusion bags into a plastic bag. Throw that bag in the trash. General instructions  Always carry your PICC identification card or wear a medical alert bracelet. Keep the tube clamped at all times, unless it is being used. Always carry a smooth-edge clamp with you to clamp the PICC if it breaks. Do not use scissors or sharp objects near the tube. You may bend your arm and move it freely. If your PICC is near or at the bend of your elbow, avoid activity with repeated motion at the elbow. Avoid lifting heavy objects as told by your health care provider. Keep all follow-up visits. This is important. You will need to have your PICC dressing changed at least once a week. Contact a health care provider if: You have pain in your arm, ear, face, or teeth. You have a fever or chills. You have redness, swelling, or pain around the insertion site. You have fluid or blood coming from the insertion site. Your insertion site feels warm to the touch. You have pus or a bad smell coming from the insertion site. Your skin feels hard and raised around the insertion site. Your PICC dressing has gotten wet or is coming off and you have not been taught how to change it. Get help right away if: You have problems with your PICC, such as  your PICC: Was tugged or pulled and has partially come out. Do not  push the PICC back in. Cannot be flushed, is hard to flush, or leaks around the insertion site when it is flushed. Makes a flushing sound when it is flushed. Appears to have a hole or tear. Is accidentally pulled all the way out. If this happens, cover the insertion site with a gauze dressing. Do not throw the PICC away. Your health care provider will need to check it to be sure the entire catheter came out. You feel your heart racing or skipping beats, or you have chest pain. You have shortness of breath or trouble breathing. You have swelling, redness, warmth, or pain in the arm in which the PICC is placed. You have a red streak going up your arm that  starts under the PICC dressing. These symptoms may be an emergency. Get help right away. Call 911. Do not wait to see if the symptoms will go away. Do not drive yourself to the hospital. Summary A peripherally inserted central catheter (PICC) is a long, thin, flexible tube (catheter) that is put into a vein in the arm or leg. If cared for properly, a PICC can remain in place for many months. Having a PICC can allow you to go home from the hospital sooner and continue treatment at home. The PICC is inserted using a germ-free (sterile) technique by a specially trained health care provider. Only a trained health care provider should remove it. Do not have your blood pressure checked on the arm in which your PICC is placed. Always keep your PICC identification card with you. This information is not intended to replace advice given to you by your health care provider. Make sure you discuss any questions you have with your health care provider. Document Revised: 07/01/2021 Document Reviewed: 07/01/2021 Elsevier Patient Education  2024 [employer].

## 2023-09-12 ENCOUNTER — Other Ambulatory Visit: Payer: Self-pay

## 2023-09-12 ENCOUNTER — Other Ambulatory Visit (HOSPITAL_COMMUNITY): Payer: Self-pay

## 2023-09-12 ENCOUNTER — Other Ambulatory Visit: Payer: Self-pay | Admitting: *Deleted

## 2023-09-12 ENCOUNTER — Other Ambulatory Visit: Payer: Self-pay | Admitting: Hematology & Oncology

## 2023-09-12 MED ORDER — RIBOCICLIB SUCC (600 MG DOSE) 200 MG PO TBPK
600.0000 mg | ORAL_TABLET | Freq: Every day | ORAL | 6 refills | Status: DC
  Filled 2023-09-12: qty 63, 28d supply, fill #0

## 2023-09-13 ENCOUNTER — Other Ambulatory Visit: Payer: Self-pay

## 2023-09-14 ENCOUNTER — Inpatient Hospital Stay (HOSPITAL_BASED_OUTPATIENT_CLINIC_OR_DEPARTMENT_OTHER): Payer: Medicare PPO | Admitting: Family

## 2023-09-14 ENCOUNTER — Inpatient Hospital Stay: Payer: Medicare PPO

## 2023-09-14 ENCOUNTER — Encounter: Payer: Self-pay | Admitting: Family

## 2023-09-14 VITALS — BP 113/76 | HR 74 | Temp 98.0°F | Resp 18 | Wt 157.0 lb

## 2023-09-14 DIAGNOSIS — C50919 Malignant neoplasm of unspecified site of unspecified female breast: Secondary | ICD-10-CM

## 2023-09-14 DIAGNOSIS — C50911 Malignant neoplasm of unspecified site of right female breast: Secondary | ICD-10-CM | POA: Diagnosis not present

## 2023-09-14 DIAGNOSIS — Z170 Estrogen receptor positive status [ER+]: Secondary | ICD-10-CM | POA: Diagnosis not present

## 2023-09-14 DIAGNOSIS — Z5111 Encounter for antineoplastic chemotherapy: Secondary | ICD-10-CM | POA: Diagnosis not present

## 2023-09-14 DIAGNOSIS — T82594D Other mechanical complication of infusion catheter, subsequent encounter: Secondary | ICD-10-CM

## 2023-09-14 DIAGNOSIS — Z452 Encounter for adjustment and management of vascular access device: Secondary | ICD-10-CM | POA: Diagnosis not present

## 2023-09-14 DIAGNOSIS — C7951 Secondary malignant neoplasm of bone: Secondary | ICD-10-CM | POA: Diagnosis not present

## 2023-09-14 DIAGNOSIS — Z5189 Encounter for other specified aftercare: Secondary | ICD-10-CM | POA: Diagnosis not present

## 2023-09-14 DIAGNOSIS — C771 Secondary and unspecified malignant neoplasm of intrathoracic lymph nodes: Secondary | ICD-10-CM | POA: Diagnosis not present

## 2023-09-14 LAB — CMP (CANCER CENTER ONLY)
ALT: 14 U/L (ref 0–44)
AST: 21 U/L (ref 15–41)
Albumin: 3.6 g/dL (ref 3.5–5.0)
Alkaline Phosphatase: 79 U/L (ref 38–126)
Anion gap: 9 (ref 5–15)
BUN: 17 mg/dL (ref 8–23)
CO2: 28 mmol/L (ref 22–32)
Calcium: 8.9 mg/dL (ref 8.9–10.3)
Chloride: 101 mmol/L (ref 98–111)
Creatinine: 1.21 mg/dL — ABNORMAL HIGH (ref 0.44–1.00)
GFR, Estimated: 48 mL/min — ABNORMAL LOW (ref >60)
Glucose, Bld: 101 mg/dL — ABNORMAL HIGH (ref 70–99)
Potassium: 3.7 mmol/L (ref 3.5–5.1)
Sodium: 138 mmol/L (ref 135–145)
Total Bilirubin: 0.5 mg/dL (ref 0.3–1.2)
Total Protein: 6.3 g/dL — ABNORMAL LOW (ref 6.5–8.1)

## 2023-09-14 LAB — CBC WITH DIFFERENTIAL (CANCER CENTER ONLY)
Abs Immature Granulocytes: 0.04 10*3/uL (ref 0.00–0.07)
Basophils Absolute: 0.1 10*3/uL (ref 0.0–0.1)
Basophils Relative: 1 %
Eosinophils Absolute: 0.1 10*3/uL (ref 0.0–0.5)
Eosinophils Relative: 1 %
HCT: 29.3 % — ABNORMAL LOW (ref 36.0–46.0)
Hemoglobin: 10.1 g/dL — ABNORMAL LOW (ref 12.0–15.0)
Immature Granulocytes: 1 %
Lymphocytes Relative: 14 %
Lymphs Abs: 0.7 10*3/uL (ref 0.7–4.0)
MCH: 39.8 pg — ABNORMAL HIGH (ref 26.0–34.0)
MCHC: 34.5 g/dL (ref 30.0–36.0)
MCV: 115.4 fL — ABNORMAL HIGH (ref 80.0–100.0)
Monocytes Absolute: 0.5 10*3/uL (ref 0.1–1.0)
Monocytes Relative: 9 %
Neutro Abs: 3.7 10*3/uL (ref 1.7–7.7)
Neutrophils Relative %: 74 %
Platelet Count: 361 10*3/uL (ref 150–400)
RBC: 2.54 MIL/uL — ABNORMAL LOW (ref 3.87–5.11)
RDW: 12.6 % (ref 11.5–15.5)
WBC Count: 5 10*3/uL (ref 4.0–10.5)
nRBC: 0 % (ref 0.0–0.2)

## 2023-09-14 MED ORDER — HEPARIN SOD (PORK) LOCK FLUSH 100 UNIT/ML IV SOLN
500.0000 [IU] | Freq: Once | INTRAVENOUS | Status: AC
  Administered 2023-09-14: 500 [IU] via INTRAVENOUS

## 2023-09-14 MED ORDER — FULVESTRANT 250 MG/5ML IM SOSY
500.0000 mg | PREFILLED_SYRINGE | Freq: Once | INTRAMUSCULAR | Status: AC
  Administered 2023-09-14: 500 mg via INTRAMUSCULAR

## 2023-09-14 MED ORDER — SODIUM CHLORIDE 0.9% FLUSH
10.0000 mL | INTRAVENOUS | Status: DC | PRN
  Administered 2023-09-14: 10 mL via INTRAVENOUS

## 2023-09-14 MED ORDER — SODIUM CHLORIDE 0.9% FLUSH
10.0000 mL | Freq: Once | INTRAVENOUS | Status: AC
  Administered 2023-09-14: 10 mL via INTRAVENOUS

## 2023-09-14 MED ORDER — ALTEPLASE 2 MG IJ SOLR
2.0000 mg | Freq: Once | INTRAMUSCULAR | Status: AC
  Administered 2023-09-14: 2 mg
  Filled 2023-09-14: qty 2

## 2023-09-14 NOTE — Patient Instructions (Signed)

## 2023-09-14 NOTE — Addendum Note (Signed)
Addended by: Lenn Sink I on: 09/14/2023 04:07 PM   Modules accepted: Orders

## 2023-09-14 NOTE — Progress Notes (Signed)
Hematology and Oncology Follow Up Visit  Tracy Thompson [id number] Dec 04, 1951 72 y.o. 09/14/2023   Principle Diagnosis:  Metastatic breast cancer-bone metastasis/hilar lymph node metastasis- ER+/PR+/HER-2 (2+) -- PIK3CA (+)   Current Therapy:        Faslodex 500 mg IM monthly -start on 11/09/2022 Ribociclib 600 mg p.o. daily (21/7) --start on 11/09/2022 --changed to 400 mg p.o. daily started on 09/05/2023 Zometa 4 mg IV every 3 months - next dose on 10/2023 Radiation therapy to left hip -completed in 11/23/2022   Interim History:  Tracy Thompson is here today with her husband for follow-up. She is doing fairly well but still notes fatigue at times as well as occasional weakness.  CA 27.29 was down to 32 at her last visit.  She is back on Kisquali 400 mg PO daily 21 on/7 off since 08/29/2023.  Hgb is up to 10.1, WBC count 5.0, ANC 3.7, platelets 361.  She has not noted any blood loss. No abnormal bruising, no petechiae.  No fever, chills, n/v, cough, rash, SOB, chest pain, palpitations, abdominal pain or changes in bowel or bladder habits.  She ambulates with a walker at home and a cane while out for added support.  Lower extremity swelling in her feet and ankles comes and goes. She states that there is normally more in the left ankle than the right. Pedal pulses are 1+. NO swelling noted at this time. No redness or edema.  Appetite comes and goes. She is trying to drink a boost or Ensure when she can. Weight is stable at 157 lbs. She is doing her best to stay well hydrated.   ECOG Performance Status: 1 - Symptomatic but completely ambulatory  Medications:  Allergies as of 09/14/2023   No Known Allergies      Medication List        Accurate as of September 14, 2023  3:00 PM. If you have any questions, ask your nurse or doctor.          amLODipine 2.5 MG tablet Commonly known as: NORVASC Take 1 pill a day.   aspirin 81 MG tablet Take 81 mg by mouth daily.    bisoprolol-hydrochlorothiazide 10-6.25 MG tablet Commonly known as: ZIAC Take 1 tablet by mouth daily.   cholecalciferol 1000 units tablet Commonly known as: VITAMIN D Take 1,000 Units by mouth daily.   Heparin Na (Pork) Lock Flsh PF 100 UNIT/ML Soln Inject 2.5 mLs (250 Units total) into the vein every 12 (twelve) hours.   hydrochlorothiazide 25 MG tablet Commonly known as: HYDRODIURIL TAKE 1 TABLET(25 MG) BY MOUTH DAILY   meclizine 12.5 MG tablet Commonly known as: ANTIVERT Take 1 tablet (12.5 mg total) by mouth 3 (three) times daily as needed for dizziness.   Normal Saline Flush 0.9 % Soln Inject 10 mLs into the vein every 12 (twelve) hours.   OLANZapine 5 MG tablet Commonly known as: ZYPREXA TAKE 1 TABLET(5 MG) BY MOUTH AT BEDTIME   ondansetron 8 MG tablet Commonly known as: ZOFRAN TAKE 1 TABLET(8 MG) BY MOUTH EVERY 8 HOURS AS NEEDED FOR NAUSEA OR VOMITING   potassium chloride SA 20 MEQ tablet Commonly known as: KLOR-CON M Take 1 tablet (20 mEq total) by mouth 2 (two) times daily.   ribociclib succ 200 MG Therapy Pack Commonly known as: KISQALI 600mg  Daily Dose Take 2 tablets (400 mg total) by mouth daily. Take for 21 days on, 7 days off, repeat every 28 days.   Kisqali (600 MG Dose) 200  MG Therapy Pack Generic drug: ribociclib succ Take 3 tablets (600 mg total) by mouth daily. Take for 21 days on, 7 days off, repeat every 28 days.   traMADol 50 MG tablet Commonly known as: ULTRAM Take 1 tablet (50 mg total) by mouth every 6 (six) hours as needed (mild pain).   zoledronic acid 4 MG/5ML injection Commonly known as: ZOMETA Inject 4 mg into the vein every 3 (three) months.        Allergies: No Known Allergies  Past Medical History, Surgical history, Social history, and Family History were reviewed and updated.  Review of Systems: All other 10 point review of systems is negative.   Physical Exam:  weight is 157 lb (71.2 kg). Her oral temperature is 98 F  (36.7 C). Her blood pressure is 113/76 and her pulse is 74. Her respiration is 18 and oxygen saturation is 97%.   Wt Readings from Last 3 Encounters:  09/14/23 157 lb (71.2 kg)  08/17/23 158 lb (71.7 kg)  07/20/23 155 lb 0.6 oz (70.3 kg)    Ocular: Sclerae unicteric, pupils equal, round and reactive to light Ear-nose-throat: Oropharynx clear, dentition fair Lymphatic: No cervical, supraclavicular or axillary adenopathy Lungs no rales or rhonchi, good excursion bilaterally Heart regular rate and rhythm, no murmur appreciated Abd soft, nontender, positive bowel sounds MSK no focal spinal tenderness, no joint edema Neuro: non-focal, well-oriented, appropriate affect Breasts: No changes. Right mastectomy intact, left breast negative. No mass, lesion or rash noted.   Lab Results  Component Value Date   WBC 1.7 (L) 08/17/2023   HGB 8.9 (L) 08/17/2023   HCT 25.2 (L) 08/17/2023   MCV 120.0 (H) 08/17/2023   PLT 213 08/17/2023   Lab Results  Component Value Date   FERRITIN 306 08/17/2023   IRON 106 08/17/2023   TIBC 293 08/17/2023   UIBC 187 08/17/2023   IRONPCTSAT 36 (H) 08/17/2023   Lab Results  Component Value Date   RETICCTPCT 2.8 01/27/2023   RBC 2.10 (L) 08/17/2023   No results found for: "KPAFRELGTCHN", "LAMBDASER", "KAPLAMBRATIO" No results found for: "IGGSERUM", "IGA", "IGMSERUM" No results found for: "TOTALPROTELP", "ALBUMINELP", "A1GS", "A2GS", "BETS", "BETA2SER", "GAMS", "MSPIKE", "SPEI"   Chemistry      Component Value Date/Time   NA 136 08/17/2023 1210   K 4.0 08/17/2023 1210   CL 103 08/17/2023 1210   CO2 25 08/17/2023 1210   BUN 21 08/17/2023 1210   CREATININE 1.33 (H) 08/17/2023 1210      Component Value Date/Time   CALCIUM 8.5 (L) 08/17/2023 1210   ALKPHOS 73 08/17/2023 1210   AST 19 08/17/2023 1210   ALT 11 08/17/2023 1210   BILITOT 0.5 08/17/2023 1210       Impression and Plan: [name] is a very pleasant 72 yo caucasian female with  metastatic breast cancer on antiestrogen therapy.  She appears to be tolerating the lower dose of Kisquali better. Labs are much improved.  She received Faslodex today as planned.  CA 27.29 pending.  We will flush her central line and changes the dressing once a week.  Zometa due again in November 2024.  Follow-up in 1 month.   Eileen Stanford, NP 9/18/20243:00 PM

## 2023-09-14 NOTE — Patient Instructions (Signed)

## 2023-09-14 NOTE — Progress Notes (Signed)
PICC line accessed, site flushes well, no blood return noted. No pain or discomfort noted upon flushing.  No swelling noted.  Cath Flo instilled at 1435 per policy. Site mark ed and label, assess ment nurse noted.

## 2023-09-15 ENCOUNTER — Encounter: Payer: Self-pay | Admitting: *Deleted

## 2023-09-15 NOTE — Progress Notes (Signed)
Continued on current treatment plan with no changes.   Oncology Nurse Navigator Documentation     09/15/2023    8:30 AM  Oncology Nurse Navigator Flowsheets  Navigator Follow Up Date: 10/12/2023  Navigator Follow Up Reason: Follow-up Appointment  Navigator Location [facility]  Navigator Encounter Type Appt/Treatment Plan Review  Patient Visit Type MedOnc  Treatment Phase Active Tx  Barriers/Navigation Needs No Barriers At This Time  Interventions None Required  Acuity Level 1-No Barriers  Support Groups/Services Friends and Family  Time Spent with Patient 15

## 2023-09-16 ENCOUNTER — Telehealth: Payer: Self-pay

## 2023-09-16 LAB — CANCER ANTIGEN 27.29: CA 27.29: 31.3 U/mL (ref 0.0–38.6)

## 2023-09-16 NOTE — Telephone Encounter (Signed)
-----   Message from Josph Macho sent at 09/16/2023  6:55 AM EDT ----- Please call and let her know that the tumor marker is normal at 31.  Great job.  Cindee Lame

## 2023-09-16 NOTE — Telephone Encounter (Signed)
Advised via MyChart.

## 2023-09-19 ENCOUNTER — Other Ambulatory Visit (HOSPITAL_BASED_OUTPATIENT_CLINIC_OR_DEPARTMENT_OTHER): Payer: Self-pay

## 2023-09-19 ENCOUNTER — Other Ambulatory Visit: Payer: Self-pay

## 2023-09-21 ENCOUNTER — Inpatient Hospital Stay: Payer: Medicare PPO

## 2023-09-21 VITALS — BP 108/53 | HR 68 | Temp 97.9°F | Resp 20

## 2023-09-21 DIAGNOSIS — Z170 Estrogen receptor positive status [ER+]: Secondary | ICD-10-CM | POA: Diagnosis not present

## 2023-09-21 DIAGNOSIS — C7951 Secondary malignant neoplasm of bone: Secondary | ICD-10-CM | POA: Diagnosis not present

## 2023-09-21 DIAGNOSIS — C50911 Malignant neoplasm of unspecified site of right female breast: Secondary | ICD-10-CM | POA: Diagnosis not present

## 2023-09-21 DIAGNOSIS — C50919 Malignant neoplasm of unspecified site of unspecified female breast: Secondary | ICD-10-CM

## 2023-09-21 DIAGNOSIS — C771 Secondary and unspecified malignant neoplasm of intrathoracic lymph nodes: Secondary | ICD-10-CM | POA: Diagnosis not present

## 2023-09-21 DIAGNOSIS — Z5111 Encounter for antineoplastic chemotherapy: Secondary | ICD-10-CM | POA: Diagnosis not present

## 2023-09-21 DIAGNOSIS — Z452 Encounter for adjustment and management of vascular access device: Secondary | ICD-10-CM | POA: Diagnosis not present

## 2023-09-21 DIAGNOSIS — Z5189 Encounter for other specified aftercare: Secondary | ICD-10-CM | POA: Diagnosis not present

## 2023-09-21 MED ORDER — SODIUM CHLORIDE 0.9% FLUSH
10.0000 mL | INTRAVENOUS | Status: DC | PRN
  Administered 2023-09-21: 10 mL via INTRAVENOUS

## 2023-09-21 MED ORDER — HEPARIN SOD (PORK) LOCK FLUSH 100 UNIT/ML IV SOLN
500.0000 [IU] | Freq: Once | INTRAVENOUS | Status: AC
  Administered 2023-09-21: 500 [IU] via INTRAVENOUS

## 2023-09-21 NOTE — Patient Instructions (Signed)
PICC Home Care Guide A peripherally inserted central catheter (PICC) is a form of IV access that allows medicines and IV fluids to be quickly put into the blood and spread throughout the body. The PICC is a long, thin, flexible tube (catheter) that is put into a vein in a person's arm or leg. The catheter ends in a large vein just outside the heart called the superior vena cava (SVC). After the PICC is put in, a chest X-ray may be done to make sure that it is in the right place. A PICC may be placed for different reasons, such as: To give medicines and liquid nutrition. To give IV fluids and blood products. To take blood samples often. If there is trouble placing a peripheral intravenous (PIV) catheter. If cared for properly, a PICC can remain in place for many months. Having a PICC can allow you to go home from the hospital sooner and continue treatment at home. Medicines and PICC care can be managed at home by a family member, caregiver, or home health care team. What are the risks? Generally, having a PICC is safe. However, problems may occur, including: A blood clot (thrombus) forming in or at the end of the PICC. A blood clot forming in a vein (deep vein thrombosis) or traveling to the lung (pulmonary embolism). Inflammation of the vein (phlebitis) in which the PICC is placed. Infection at the insertion site or in the blood. Blood infections from central lines, like PICCs, can be serious and often require a hospital stay. PICC malposition, or PICC movement or poor placement. A break or cut in the PICC. Do not use scissors near the PICC. Nerve or tendon irritation or injury during PICC insertion. How to care for your PICC Please follow the specific guidelines provided by your health care provider. Preventing infection You and any caregivers should wash your hands often with soap and water for at least 20 seconds. Wash hands: Before touching the PICC or the infusion device. Before changing a  bandage (dressing). Do not change the dressing unless you have been taught to do so and have shown you are able to change it safely. Flush the PICC as told. Tell your health care provider right away if the PICC is hard to flush or does not flush. Do not use force to flush the PICC. Use clean and germ-free (sterile) supplies only. Keep the supplies in a dry place. Do not reuse needles, syringes, or any other supplies. Reusing supplies can lead to infection. Keep the PICC dressing dry and secure it with tape if the edges stop sticking to your skin. Check your PICC insertion site every day for signs of infection. Check for: Redness, swelling, or pain. Fluid or blood. Warmth. Pus or a bad smell. Preventing other problems Do not use a syringe that is less than 10 mL to flush the PICC. Do not have your blood pressure checked on the arm in which the PICC is placed. Do not ever pull or tug on the PICC. Keep it secured to your arm with tape or a stretch wrap when not in use. Do not take the PICC out yourself. Only a trained health care provider should remove the PICC. Keep pets and children away from your PICC. How to care for your PICC dressing Keep your PICC dressing clean and dry to prevent infection. Do not take baths, swim, or use a hot tub until your health care provider approves. Ask your health care provider if you can take  showers. You may only be allowed to take sponge baths. When you are allowed to shower: Ask your health care provider to teach you how to wrap the PICC. Cover the PICC with clear plastic wrap and tape to keep it dry while showering. Follow instructions from your health care provider about how to take care of your insertion site and dressing. Make sure you: Wash your hands with soap and water for at least 20 seconds before and after you change your dressing. If soap and water are not available, use hand sanitizer. Change your dressing only if taught to do so by your health care  provider. Your PICC dressing needs to be changed if it becomes loose or wet. Leave stitches (sutures), skin glue, or adhesive strips in place. These skin closures may need to stay in place for 2 weeks or longer. If adhesive strip edges start to loosen and curl up, you may trim the loose edges. Do not remove adhesive strips completely unless your health care provider tells you to do that. Follow these instructions at home: Disposal of supplies Throw away any syringes in a disposal container that is meant for sharp items (sharps container). You can buy a sharps container from a pharmacy, or you can make one by using an empty, hard plastic bottle with a lid. Place any used dressings or infusion bags into a plastic bag. Throw that bag in the trash. General instructions  Always carry your PICC identification card or wear a medical alert bracelet. Keep the tube clamped at all times, unless it is being used. Always carry a smooth-edge clamp with you to clamp the PICC if it breaks. Do not use scissors or sharp objects near the tube. You may bend your arm and move it freely. If your PICC is near or at the bend of your elbow, avoid activity with repeated motion at the elbow. Avoid lifting heavy objects as told by your health care provider. Keep all follow-up visits. This is important. You will need to have your PICC dressing changed at least once a week. Contact a health care provider if: You have pain in your arm, ear, face, or teeth. You have a fever or chills. You have redness, swelling, or pain around the insertion site. You have fluid or blood coming from the insertion site. Your insertion site feels warm to the touch. You have pus or a bad smell coming from the insertion site. Your skin feels hard and raised around the insertion site. Your PICC dressing has gotten wet or is coming off and you have not been taught how to change it. Get help right away if: You have problems with your PICC, such as  your PICC: Was tugged or pulled and has partially come out. Do not  push the PICC back in. Cannot be flushed, is hard to flush, or leaks around the insertion site when it is flushed. Makes a flushing sound when it is flushed. Appears to have a hole or tear. Is accidentally pulled all the way out. If this happens, cover the insertion site with a gauze dressing. Do not throw the PICC away. Your health care provider will need to check it to be sure the entire catheter came out. You feel your heart racing or skipping beats, or you have chest pain. You have shortness of breath or trouble breathing. You have swelling, redness, warmth, or pain in the arm in which the PICC is placed. You have a red streak going up your arm that  starts under the PICC dressing. These symptoms may be an emergency. Get help right away. Call 911. Do not wait to see if the symptoms will go away. Do not drive yourself to the hospital. Summary A peripherally inserted central catheter (PICC) is a long, thin, flexible tube (catheter) that is put into a vein in the arm or leg. If cared for properly, a PICC can remain in place for many months. Having a PICC can allow you to go home from the hospital sooner and continue treatment at home. The PICC is inserted using a germ-free (sterile) technique by a specially trained health care provider. Only a trained health care provider should remove it. Do not have your blood pressure checked on the arm in which your PICC is placed. Always keep your PICC identification card with you. This information is not intended to replace advice given to you by your health care provider. Make sure you discuss any questions you have with your health care provider. Document Revised: 07/01/2021 Document Reviewed: 07/01/2021 Elsevier Patient Education  2024 [employer].

## 2023-09-28 ENCOUNTER — Inpatient Hospital Stay: Payer: Medicare PPO | Attending: Hematology & Oncology

## 2023-09-28 VITALS — BP 122/66 | HR 81 | Temp 97.8°F | Resp 20

## 2023-09-28 DIAGNOSIS — C7951 Secondary malignant neoplasm of bone: Secondary | ICD-10-CM | POA: Insufficient documentation

## 2023-09-28 DIAGNOSIS — Z7982 Long term (current) use of aspirin: Secondary | ICD-10-CM | POA: Insufficient documentation

## 2023-09-28 DIAGNOSIS — Z5111 Encounter for antineoplastic chemotherapy: Secondary | ICD-10-CM | POA: Diagnosis not present

## 2023-09-28 DIAGNOSIS — Z79899 Other long term (current) drug therapy: Secondary | ICD-10-CM | POA: Insufficient documentation

## 2023-09-28 DIAGNOSIS — C50911 Malignant neoplasm of unspecified site of right female breast: Secondary | ICD-10-CM | POA: Diagnosis not present

## 2023-09-28 DIAGNOSIS — Z79818 Long term (current) use of other agents affecting estrogen receptors and estrogen levels: Secondary | ICD-10-CM | POA: Insufficient documentation

## 2023-09-28 DIAGNOSIS — Z170 Estrogen receptor positive status [ER+]: Secondary | ICD-10-CM | POA: Diagnosis not present

## 2023-09-28 DIAGNOSIS — Z452 Encounter for adjustment and management of vascular access device: Secondary | ICD-10-CM | POA: Insufficient documentation

## 2023-09-28 DIAGNOSIS — C771 Secondary and unspecified malignant neoplasm of intrathoracic lymph nodes: Secondary | ICD-10-CM | POA: Insufficient documentation

## 2023-09-28 DIAGNOSIS — C50919 Malignant neoplasm of unspecified site of unspecified female breast: Secondary | ICD-10-CM

## 2023-09-28 MED ORDER — HEPARIN SOD (PORK) LOCK FLUSH 100 UNIT/ML IV SOLN
500.0000 [IU] | Freq: Once | INTRAVENOUS | Status: AC
  Administered 2023-09-28: 500 [IU] via INTRAVENOUS

## 2023-09-28 MED ORDER — SODIUM CHLORIDE 0.9% FLUSH
10.0000 mL | INTRAVENOUS | Status: DC | PRN
  Administered 2023-09-28: 10 mL via INTRAVENOUS

## 2023-09-28 NOTE — Patient Instructions (Signed)
PICC Home Care Guide A peripherally inserted central catheter (PICC) is a form of IV access that allows medicines and IV fluids to be quickly put into the blood and spread throughout the body. The PICC is a long, thin, flexible tube (catheter) that is put into a vein in a person's arm or leg. The catheter ends in a large vein just outside the heart called the superior vena cava (SVC). After the PICC is put in, a chest X-ray may be done to make sure that it is in the right place. A PICC may be placed for different reasons, such as: To give medicines and liquid nutrition. To give IV fluids and blood products. To take blood samples often. If there is trouble placing a peripheral intravenous (PIV) catheter. If cared for properly, a PICC can remain in place for many months. Having a PICC can allow you to go home from the hospital sooner and continue treatment at home. Medicines and PICC care can be managed at home by a family member, caregiver, or home health care team. What are the risks? Generally, having a PICC is safe. However, problems may occur, including: A blood clot (thrombus) forming in or at the end of the PICC. A blood clot forming in a vein (deep vein thrombosis) or traveling to the lung (pulmonary embolism). Inflammation of the vein (phlebitis) in which the PICC is placed. Infection at the insertion site or in the blood. Blood infections from central lines, like PICCs, can be serious and often require a hospital stay. PICC malposition, or PICC movement or poor placement. A break or cut in the PICC. Do not use scissors near the PICC. Nerve or tendon irritation or injury during PICC insertion. How to care for your PICC Please follow the specific guidelines provided by your health care provider. Preventing infection You and any caregivers should wash your hands often with soap and water for at least 20 seconds. Wash hands: Before touching the PICC or the infusion device. Before changing a  bandage (dressing). Do not change the dressing unless you have been taught to do so and have shown you are able to change it safely. Flush the PICC as told. Tell your health care provider right away if the PICC is hard to flush or does not flush. Do not use force to flush the PICC. Use clean and germ-free (sterile) supplies only. Keep the supplies in a dry place. Do not reuse needles, syringes, or any other supplies. Reusing supplies can lead to infection. Keep the PICC dressing dry and secure it with tape if the edges stop sticking to your skin. Check your PICC insertion site every day for signs of infection. Check for: Redness, swelling, or pain. Fluid or blood. Warmth. Pus or a bad smell. Preventing other problems Do not use a syringe that is less than 10 mL to flush the PICC. Do not have your blood pressure checked on the arm in which the PICC is placed. Do not ever pull or tug on the PICC. Keep it secured to your arm with tape or a stretch wrap when not in use. Do not take the PICC out yourself. Only a trained health care provider should remove the PICC. Keep pets and children away from your PICC. How to care for your PICC dressing Keep your PICC dressing clean and dry to prevent infection. Do not take baths, swim, or use a hot tub until your health care provider approves. Ask your health care provider if you can take  showers. You may only be allowed to take sponge baths. When you are allowed to shower: Ask your health care provider to teach you how to wrap the PICC. Cover the PICC with clear plastic wrap and tape to keep it dry while showering. Follow instructions from your health care provider about how to take care of your insertion site and dressing. Make sure you: Wash your hands with soap and water for at least 20 seconds before and after you change your dressing. If soap and water are not available, use hand sanitizer. Change your dressing only if taught to do so by your health care  provider. Your PICC dressing needs to be changed if it becomes loose or wet. Leave stitches (sutures), skin glue, or adhesive strips in place. These skin closures may need to stay in place for 2 weeks or longer. If adhesive strip edges start to loosen and curl up, you may trim the loose edges. Do not remove adhesive strips completely unless your health care provider tells you to do that. Follow these instructions at home: Disposal of supplies Throw away any syringes in a disposal container that is meant for sharp items (sharps container). You can buy a sharps container from a pharmacy, or you can make one by using an empty, hard plastic bottle with a lid. Place any used dressings or infusion bags into a plastic bag. Throw that bag in the trash. General instructions  Always carry your PICC identification card or wear a medical alert bracelet. Keep the tube clamped at all times, unless it is being used. Always carry a smooth-edge clamp with you to clamp the PICC if it breaks. Do not use scissors or sharp objects near the tube. You may bend your arm and move it freely. If your PICC is near or at the bend of your elbow, avoid activity with repeated motion at the elbow. Avoid lifting heavy objects as told by your health care provider. Keep all follow-up visits. This is important. You will need to have your PICC dressing changed at least once a week. Contact a health care provider if: You have pain in your arm, ear, face, or teeth. You have a fever or chills. You have redness, swelling, or pain around the insertion site. You have fluid or blood coming from the insertion site. Your insertion site feels warm to the touch. You have pus or a bad smell coming from the insertion site. Your skin feels hard and raised around the insertion site. Your PICC dressing has gotten wet or is coming off and you have not been taught how to change it. Get help right away if: You have problems with your PICC, such as  your PICC: Was tugged or pulled and has partially come out. Do not  push the PICC back in. Cannot be flushed, is hard to flush, or leaks around the insertion site when it is flushed. Makes a flushing sound when it is flushed. Appears to have a hole or tear. Is accidentally pulled all the way out. If this happens, cover the insertion site with a gauze dressing. Do not throw the PICC away. Your health care provider will need to check it to be sure the entire catheter came out. You feel your heart racing or skipping beats, or you have chest pain. You have shortness of breath or trouble breathing. You have swelling, redness, warmth, or pain in the arm in which the PICC is placed. You have a red streak going up your arm that  starts under the PICC dressing. These symptoms may be an emergency. Get help right away. Call 911. Do not wait to see if the symptoms will go away. Do not drive yourself to the hospital. Summary A peripherally inserted central catheter (PICC) is a long, thin, flexible tube (catheter) that is put into a vein in the arm or leg. If cared for properly, a PICC can remain in place for many months. Having a PICC can allow you to go home from the hospital sooner and continue treatment at home. The PICC is inserted using a germ-free (sterile) technique by a specially trained health care provider. Only a trained health care provider should remove it. Do not have your blood pressure checked on the arm in which your PICC is placed. Always keep your PICC identification card with you. This information is not intended to replace advice given to you by your health care provider. Make sure you discuss any questions you have with your health care provider. Document Revised: 07/01/2021 Document Reviewed: 07/01/2021 Elsevier Patient Education  2024 [employer].

## 2023-09-30 ENCOUNTER — Ambulatory Visit (INDEPENDENT_AMBULATORY_CARE_PROVIDER_SITE_OTHER): Payer: Medicare PPO | Admitting: Family Medicine

## 2023-09-30 ENCOUNTER — Encounter: Payer: Self-pay | Admitting: Family Medicine

## 2023-09-30 ENCOUNTER — Encounter: Payer: Self-pay | Admitting: Medical Oncology

## 2023-09-30 VITALS — BP 100/70 | HR 80 | Temp 97.8°F | Resp 18 | Ht 66.0 in | Wt 158.4 lb

## 2023-09-30 DIAGNOSIS — Z0000 Encounter for general adult medical examination without abnormal findings: Secondary | ICD-10-CM | POA: Diagnosis not present

## 2023-09-30 DIAGNOSIS — E785 Hyperlipidemia, unspecified: Secondary | ICD-10-CM | POA: Diagnosis not present

## 2023-09-30 DIAGNOSIS — I10 Essential (primary) hypertension: Secondary | ICD-10-CM

## 2023-09-30 DIAGNOSIS — Z23 Encounter for immunization: Secondary | ICD-10-CM

## 2023-09-30 DIAGNOSIS — H8110 Benign paroxysmal vertigo, unspecified ear: Secondary | ICD-10-CM | POA: Diagnosis not present

## 2023-09-30 DIAGNOSIS — E559 Vitamin D deficiency, unspecified: Secondary | ICD-10-CM | POA: Diagnosis not present

## 2023-09-30 MED ORDER — MECLIZINE HCL 12.5 MG PO TABS: 12.5000 mg | ORAL_TABLET | Freq: Three times a day (TID) | ORAL | 4 refills | Status: DC | PRN

## 2023-09-30 NOTE — Assessment & Plan Note (Signed)
Encourage heart healthy diet such as MIND or DASH diet, increase exercise, avoid trans fats, simple carbohydrates and processed foods, consider a krill or fish or flaxseed oil cap daily.  °

## 2023-09-30 NOTE — Assessment & Plan Note (Signed)
Ghm utd Check labs Health Maintenance  Topic Date Due   Colonoscopy  07/25/2022   DTaP/Tdap/Td (3 - Td or Tdap) 04/17/2023   Medicare Annual Wellness (AWV)  07/29/2023   COVID-19 Vaccine (6 - 2023-24 season) 08/28/2023   MAMMOGRAM  09/02/2023   DEXA SCAN  09/01/2024   Pneumonia Vaccine 57+ Years old  Completed   INFLUENZA VACCINE  Completed   Hepatitis C Screening  Completed   Zoster Vaccines- Shingrix  Completed   HPV VACCINES  Aged Out

## 2023-09-30 NOTE — Progress Notes (Signed)
Established Patient Office Visit  Subjective   Patient ID: Tracy Thompson, female    DOB: [birth year]  Age: 72 y.o. MRN: [id number]  Chief Complaint  Patient presents with   Annual Exam    Pt states fasting     HPI Discussed the use of AI scribe software for clinical note transcription with the patient, who gave verbal consent to proceed.  History of Present Illness   The patient, with a history of cancer, presents for a routine check-up. She reports changes in their medication regimen due to a low white blood cell count, which had resulted in decreased energy levels. Since the reduction in medication dosage, the patient notes an improvement in their energy levels. She also mentions having a PICC line for blood work, which sometimes gets clogged.  The patient also reports dry skin, which they attribute to their medication. She has noticed a decrease in their physical activity due to weakness and balance issues, which they believe were exacerbated by radiation treatment. She has fallen five times, which they attribute to weakness and balance issues. She uses a walker at home and a cane outside to aid with mobility.  The patient also discusses a recent mastectomy due to a mass on their chest. She has decided not to pursue further mammograms or colonoscopies, despite being aware of the risks. She reports a decrease in appetite and significant weight loss over the past year, but notes that their appetite has recently returned.      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   Breast cancer metastasized to bone (HCC) 09/29/2022   Preventative health care 09/15/2021   Vitamin D deficiency 09/15/2021   Hyperglycemia 09/15/2021   Bilateral impacted cerumen 08/17/2021   Cellulitis of right arm 02/11/2017   GERD - clinical dx only based on response to RX   10/31/2016   Dyspnea 09/17/2016   Abnormal CXR 09/17/2016   Lymphedema 08/23/2014   Morbid obesity due to excess calories (HCC) 04/17/2014   Diverticulosis of colon without hemorrhage 04/16/2013   Internal hemorrhoid, bleeding 04/16/2013   Factor 5 Leiden mutation, heterozygous (HCC) 02/11/2012   Varicose veins 09/20/2011   Hyperlipidemia 07/17/2010   BENIGN NEOPLASM OF SKIN OF TRUNK EXCEPT SCROTUM 01/15/2010   SKIN TAG 01/15/2010   DYSPNEA ON EXERTION 07/16/2009   CHEST PAIN, EXERTIONAL 07/16/2009   SINUSITIS- ACUTE-NOS 11/01/2008   BENIGN POSITIONAL VERTIGO 10/18/2008   HYPOPOTASSEMIA 05/17/2008   LYMPHEDEMA, RIGHT ARM 05/02/2008   CELLULITIS/ABSCESS, ARM 05/02/2008   Hypertension 09/14/2007   POSTMENOPAUSAL STATUS 09/14/2007   SYMPTOM, DYSURIA 09/14/2007   Personal history of malignant neoplasm of breast 09/14/2007   Past Medical History:  Diagnosis Date   Breast cancer (HCC) 1994   s/p chemo 8/94-1/95, radiation 8/94-11/94   Breast cancer metastasized to bone (HCC) 09/29/2022   Cataracts, bilateral    Dyspnea    Factor 5 Leiden mutation, heterozygous (HCC)    also has Factor 8 problems   Headache    younger 29   History of radiation therapy    Lumbar Spine, Left hip- 11/03/22-11/17/22- Dr. 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)    Past Surgical History:  Procedure Laterality Date   BREAST LUMPECTOMY Right 1994   w/ radiation and chemotherapy following lumpectomy   BRONCHIAL BIOPSY  11/10/2022   Procedure: BRONCHIAL BIOPSIES;  Surgeon: Lupita Leash, MD;  Location: [facility] ENDOSCOPY;  Service: Cardiopulmonary;;   BRONCHIAL BRUSHINGS  11/10/2022   Procedure: BRONCHIAL BRUSHINGS;  Surgeon: Lupita Leash, MD;  Location: [facility] ENDOSCOPY;  Service: Cardiopulmonary;;   BRONCHIAL WASHINGS  11/10/2022    Procedure: BRONCHIAL WASHINGS;  Surgeon: Lupita Leash, MD;  Location: [facility] ENDOSCOPY;  Service: Cardiopulmonary;;   CHOLECYSTECTOMY  06/20/2002   Laparoscopic cholestectomy   IR FLUORO GUIDE CV LINE LEFT  07/08/2023   IR IMAGING GUIDED PORT INSERTION  07/08/2023   IR US GUIDE VASC ACCESS LEFT  07/08/2023   IR VENO/EXT/UNI LEFT  07/08/2023   IR VENO/JUGULAR LEFT  07/08/2023   TOTAL MASTECTOMY Right 04/12/2023   Procedure: RIGHT TOTAL MASTECTOMY;  Surgeon: Emelia Loron, MD;  Location: [facility] OR;  Service: General;  Laterality: Right;   varicose veins Bilateral 2013   laser surgery   Dr. Jimmie Molly   VIDEO BRONCHOSCOPY WITH ENDOBRONCHIAL ULTRASOUND N/A 11/10/2022   Procedure: VIDEO BRONCHOSCOPY WITH ENDOBRONCHIAL ULTRASOUND;  Surgeon: Lupita Leash, MD;  Location: [facility] ENDOSCOPY;  Service: Cardiopulmonary;  Laterality: N/A;   Social History   Tobacco Use   Smoking status: Never   Smokeless tobacco: Never  Vaping Use   Vaping status: Never Used  Substance Use Topics   Alcohol use: No   Drug use: No   Social History   Socioeconomic History   Marital status: Married    Spouse name: KYE HUNTSBERGER   Number of children: Not on file   Years of education: Not on file   Highest education level: Not on file  Occupational History   Occupation: retired Magazine features editor: RETIRED  Tobacco Use   Smoking status: Never   Smokeless tobacco: Never  Vaping Use   Vaping status: Never Used  Substance and Sexual Activity   Alcohol use: No   Drug use: No   Sexual activity: Yes    Partners: Male  Other Topics Concern   Not on file  Social History Narrative   Exercise---  no ,    Social Determinants of Health   Financial Resource Strain: Low Risk  (07/20/2021)   Overall Financial Resource Strain (CARDIA)    Difficulty of Paying Living Expenses: Not hard at all  Food Insecurity: No Food Insecurity (04/12/2023)   Hunger Vital Sign    Worried About Running Out of Food in the Last Year:  Never true    Ran Out of Food in the Last Year: Never true  Transportation Needs: No Transportation Needs (04/12/2023)   PRAPARE - Administrator, Civil Service (Medical): No    Lack of Transportation (Non-Medical): No  Physical Activity: Sufficiently Active (07/20/2021)   Exercise Vital Sign    Days of Exercise per Week: 7 days    Minutes of Exercise per Session: 30 min  Stress: No Stress Concern Present (07/20/2021)   [employer] of Occupational Health - Occupational Stress Questionnaire    Feeling of Stress : Not at all  Social Connections: Moderately Isolated (07/20/2021)   Social Connection and Isolation Panel [NHANES]    Frequency of Communication with Friends and Family: More than three times a week    Frequency of Social Gatherings with Friends and Family: More than three times a week    Attends Religious Services: Never  Active Member of Clubs or Organizations: No    Attends Banker Meetings: Never    Marital Status: Married  Catering manager Violence: Not At Risk (07/20/2021)   Humiliation, Afraid, Rape, and Kick questionnaire    Fear of Current or Ex-Partner: No    Emotionally Abused: No    Physically Abused: No    Sexually Abused: No   Family Status  Relation Name Status   Mother  Deceased at age 32       FTT and dementia, copd   Father  Deceased at age 53       stroke   Brother  Deceased at age 30       copd   Cousin  Deceased       covid    Other  (Not Specified)  No partnership data on file   Family History  Problem Relation Age of Onset   Colon cancer Mother 10 - 77   Stroke Mother    Hypertension Mother    Hyperlipidemia Mother    Hepatitis Mother        hep A   COPD Mother    Dementia Mother    Bladder Cancer Mother 19   Hypertension Father    Prostate cancer Father    Diabetes Father    Stroke Father    Pulmonary embolism Father    Deep vein thrombosis Father        PE   Schizophrenia Brother    Mental illness  Brother    COPD Brother    Hepatitis C Brother    Liver disease Other    No Known Allergies    ROS    Objective:     BP 100/70 (BP Location: Left Arm, Patient Position: Sitting, Cuff Size: Normal)   Pulse 80   Temp 97.8 F (36.6 C) (Oral)   Resp 18   Ht 5\' 6"  (1.676 m)   Wt 158 lb 6.4 oz (71.8 kg)   SpO2 97%   BMI 25.57 kg/m  BP Readings from Last 3 Encounters:  09/30/23 100/70  09/28/23 122/66  09/21/23 (!) 108/53   Wt Readings from Last 3 Encounters:  09/30/23 158 lb 6.4 oz (71.8 kg)  09/14/23 157 lb (71.2 kg)  08/17/23 158 lb (71.7 kg)   SpO2 Readings from Last 3 Encounters:  09/30/23 97%  09/28/23 100%  09/21/23 100%      Physical Exam   No results found for any visits on 09/30/23.  Last CBC Lab Results  Component Value Date   WBC 5.0 09/14/2023   HGB 10.1 (L) 09/14/2023   HCT 29.3 (L) 09/14/2023   MCV 115.4 (H) 09/14/2023   MCH 39.8 (H) 09/14/2023   RDW 12.6 09/14/2023   PLT 361 09/14/2023   Last metabolic panel Lab Results  Component Value Date   GLUCOSE 101 (H) 09/14/2023   NA 138 09/14/2023   K 3.7 09/14/2023   CL 101 09/14/2023   CO2 28 09/14/2023   BUN 17 09/14/2023   CREATININE 1.21 (H) 09/14/2023   GFRNONAA 48 (L) 09/14/2023   CALCIUM 8.9 09/14/2023   PROT 6.3 (L) 09/14/2023   ALBUMIN 3.6 09/14/2023   BILITOT 0.5 09/14/2023   ALKPHOS 79 09/14/2023   AST 21 09/14/2023   ALT 14 09/14/2023   ANIONGAP 9 09/14/2023   Last lipids Lab Results  Component Value Date   CHOL 207 (H) 03/11/2023   HDL 64.80 03/11/2023   LDLCALC 123 (H) 03/11/2023   LDLDIRECT  135.7 02/11/2012   TRIG 96.0 03/11/2023   CHOLHDL 3 03/11/2023   Last hemoglobin A1c Lab Results  Component Value Date   HGBA1C 6.4 09/15/2021   Last thyroid functions Lab Results  Component Value Date   TSH 2.695 08/17/2023   Last vitamin D Lab Results  Component Value Date   VD25OH 59.59 09/21/2022   Last vitamin B12 and Folate Lab Results  Component Value  Date   VITAMINB12 >1500 (H) 09/21/2022      The 10-year ASCVD risk score (Arnett DK, et al., 2019) is: 8.6%    Assessment & Plan:   Problem List Items Addressed This Visit       Unprioritized   BENIGN POSITIONAL VERTIGO   Relevant Medications   meclizine (ANTIVERT) 12.5 MG tablet   Vitamin D deficiency   Relevant Orders   VITAMIN D 25 Hydroxy (Vit-D Deficiency, Fractures)   Preventative health care - Primary    Ghm utd Check labs Health Maintenance  Topic Date Due   Colonoscopy  07/25/2022   DTaP/Tdap/Td (3 - Td or Tdap) 04/17/2023   Medicare Annual Wellness (AWV)  07/29/2023   COVID-19 Vaccine (6 - 2023-24 season) 08/28/2023   MAMMOGRAM  09/02/2023   DEXA SCAN  09/01/2024   Pneumonia Vaccine 25+ Years old  Completed   INFLUENZA VACCINE  Completed   Hepatitis C Screening  Completed   Zoster Vaccines- Shingrix  Completed   HPV VACCINES  Aged Out         Hypertension    Well controlled, no changes to meds. Encouraged heart healthy diet such as the DASH diet and exercise as tolerated.        Hyperlipidemia    Encourage heart healthy diet such as MIND or DASH diet, increase exercise, avoid trans fats, simple carbohydrates and processed foods, consider a krill or fish or flaxseed oil cap daily.        Relevant Orders   CBC with Differential/Platelet   Comprehensive metabolic panel   Lipid panel   Other Visit Diagnoses     Need for influenza vaccination       Relevant Orders   Flu Vaccine Trivalent High Dose (Fluad) (Completed)   Essential hypertension       Relevant Orders   Lipid panel   TSH     Assessment and Plan    Metastatic Breast Cancer Patient reports improved energy levels since reducing medication dosage due to low white blood cell count. Patient has a PICC line for medication administration and monthly blood work. -Continue current medication regimen. -Next blood work scheduled for 10/12/2023.  Post-Mastectomy Patient reports satisfaction  with mastectomy outcome, despite asymmetry. Uses prosthetics for balance. -No further action required at this time.  General Health Maintenance -Administered flu shot during visit. -Advise patient to receive tetanus shot at pharmacy during next visit. -Encourage light weight exercises to build muscle strength. -Continue monthly blood work and medication regimen. -Next appointment with Dr. Margarette Canada or Sarah to be determined.  Screening Refusals Patient has refused mammogram and colonoscopy screenings due to current cancer status and previous negative experiences. -Document patient's refusal and revisit discussion as necessary.        No follow-ups on file.    Donato Schultz, DO

## 2023-09-30 NOTE — Assessment & Plan Note (Signed)
Well controlled, no changes to meds. Encouraged heart healthy diet such as the DASH diet and exercise as tolerated.  °

## 2023-10-03 ENCOUNTER — Other Ambulatory Visit: Payer: Self-pay | Admitting: Hematology & Oncology

## 2023-10-04 ENCOUNTER — Encounter: Payer: Self-pay | Admitting: Hematology & Oncology

## 2023-10-04 ENCOUNTER — Other Ambulatory Visit (HOSPITAL_COMMUNITY): Payer: Self-pay

## 2023-10-05 ENCOUNTER — Inpatient Hospital Stay: Payer: Medicare PPO

## 2023-10-05 VITALS — BP 132/69 | HR 78 | Temp 98.3°F | Resp 20

## 2023-10-05 DIAGNOSIS — Z79818 Long term (current) use of other agents affecting estrogen receptors and estrogen levels: Secondary | ICD-10-CM | POA: Diagnosis not present

## 2023-10-05 DIAGNOSIS — C50911 Malignant neoplasm of unspecified site of right female breast: Secondary | ICD-10-CM | POA: Diagnosis not present

## 2023-10-05 DIAGNOSIS — Z170 Estrogen receptor positive status [ER+]: Secondary | ICD-10-CM | POA: Diagnosis not present

## 2023-10-05 DIAGNOSIS — Z5111 Encounter for antineoplastic chemotherapy: Secondary | ICD-10-CM | POA: Diagnosis not present

## 2023-10-05 DIAGNOSIS — C7951 Secondary malignant neoplasm of bone: Secondary | ICD-10-CM | POA: Diagnosis not present

## 2023-10-05 DIAGNOSIS — Z452 Encounter for adjustment and management of vascular access device: Secondary | ICD-10-CM | POA: Diagnosis not present

## 2023-10-05 DIAGNOSIS — C50919 Malignant neoplasm of unspecified site of unspecified female breast: Secondary | ICD-10-CM

## 2023-10-05 DIAGNOSIS — Z79899 Other long term (current) drug therapy: Secondary | ICD-10-CM | POA: Diagnosis not present

## 2023-10-05 DIAGNOSIS — C771 Secondary and unspecified malignant neoplasm of intrathoracic lymph nodes: Secondary | ICD-10-CM | POA: Diagnosis not present

## 2023-10-05 DIAGNOSIS — Z7982 Long term (current) use of aspirin: Secondary | ICD-10-CM | POA: Diagnosis not present

## 2023-10-05 MED ORDER — SODIUM CHLORIDE 0.9% FLUSH
10.0000 mL | INTRAVENOUS | Status: DC | PRN
  Administered 2023-10-05: 10 mL via INTRAVENOUS

## 2023-10-05 MED ORDER — HEPARIN SOD (PORK) LOCK FLUSH 100 UNIT/ML IV SOLN
500.0000 [IU] | Freq: Once | INTRAVENOUS | Status: AC
  Administered 2023-10-05: 500 [IU] via INTRAVENOUS

## 2023-10-05 NOTE — Patient Instructions (Signed)
PICC Home Care Guide A peripherally inserted central catheter (PICC) is a form of IV access that allows medicines and IV fluids to be quickly put into the blood and spread throughout the body. The PICC is a long, thin, flexible tube (catheter) that is put into a vein in a person's arm or leg. The catheter ends in a large vein just outside the heart called the superior vena cava (SVC). After the PICC is put in, a chest X-ray may be done to make sure that it is in the right place. A PICC may be placed for different reasons, such as: To give medicines and liquid nutrition. To give IV fluids and blood products. To take blood samples often. If there is trouble placing a peripheral intravenous (PIV) catheter. If cared for properly, a PICC can remain in place for many months. Having a PICC can allow you to go home from the hospital sooner and continue treatment at home. Medicines and PICC care can be managed at home by a family member, caregiver, or home health care team. What are the risks? Generally, having a PICC is safe. However, problems may occur, including: A blood clot (thrombus) forming in or at the end of the PICC. A blood clot forming in a vein (deep vein thrombosis) or traveling to the lung (pulmonary embolism). Inflammation of the vein (phlebitis) in which the PICC is placed. Infection at the insertion site or in the blood. Blood infections from central lines, like PICCs, can be serious and often require a hospital stay. PICC malposition, or PICC movement or poor placement. A break or cut in the PICC. Do not use scissors near the PICC. Nerve or tendon irritation or injury during PICC insertion. How to care for your PICC Please follow the specific guidelines provided by your health care provider. Preventing infection You and any caregivers should wash your hands often with soap and water for at least 20 seconds. Wash hands: Before touching the PICC or the infusion device. Before changing a  bandage (dressing). Do not change the dressing unless you have been taught to do so and have shown you are able to change it safely. Flush the PICC as told. Tell your health care provider right away if the PICC is hard to flush or does not flush. Do not use force to flush the PICC. Use clean and germ-free (sterile) supplies only. Keep the supplies in a dry place. Do not reuse needles, syringes, or any other supplies. Reusing supplies can lead to infection. Keep the PICC dressing dry and secure it with tape if the edges stop sticking to your skin. Check your PICC insertion site every day for signs of infection. Check for: Redness, swelling, or pain. Fluid or blood. Warmth. Pus or a bad smell. Preventing other problems Do not use a syringe that is less than 10 mL to flush the PICC. Do not have your blood pressure checked on the arm in which the PICC is placed. Do not ever pull or tug on the PICC. Keep it secured to your arm with tape or a stretch wrap when not in use. Do not take the PICC out yourself. Only a trained health care provider should remove the PICC. Keep pets and children away from your PICC. How to care for your PICC dressing Keep your PICC dressing clean and dry to prevent infection. Do not take baths, swim, or use a hot tub until your health care provider approves. Ask your health care provider if you can take  showers. You may only be allowed to take sponge baths. When you are allowed to shower: Ask your health care provider to teach you how to wrap the PICC. Cover the PICC with clear plastic wrap and tape to keep it dry while showering. Follow instructions from your health care provider about how to take care of your insertion site and dressing. Make sure you: Wash your hands with soap and water for at least 20 seconds before and after you change your dressing. If soap and water are not available, use hand sanitizer. Change your dressing only if taught to do so by your health care  provider. Your PICC dressing needs to be changed if it becomes loose or wet. Leave stitches (sutures), skin glue, or adhesive strips in place. These skin closures may need to stay in place for 2 weeks or longer. If adhesive strip edges start to loosen and curl up, you may trim the loose edges. Do not remove adhesive strips completely unless your health care provider tells you to do that. Follow these instructions at home: Disposal of supplies Throw away any syringes in a disposal container that is meant for sharp items (sharps container). You can buy a sharps container from a pharmacy, or you can make one by using an empty, hard plastic bottle with a lid. Place any used dressings or infusion bags into a plastic bag. Throw that bag in the trash. General instructions  Always carry your PICC identification card or wear a medical alert bracelet. Keep the tube clamped at all times, unless it is being used. Always carry a smooth-edge clamp with you to clamp the PICC if it breaks. Do not use scissors or sharp objects near the tube. You may bend your arm and move it freely. If your PICC is near or at the bend of your elbow, avoid activity with repeated motion at the elbow. Avoid lifting heavy objects as told by your health care provider. Keep all follow-up visits. This is important. You will need to have your PICC dressing changed at least once a week. Contact a health care provider if: You have pain in your arm, ear, face, or teeth. You have a fever or chills. You have redness, swelling, or pain around the insertion site. You have fluid or blood coming from the insertion site. Your insertion site feels warm to the touch. You have pus or a bad smell coming from the insertion site. Your skin feels hard and raised around the insertion site. Your PICC dressing has gotten wet or is coming off and you have not been taught how to change it. Get help right away if: You have problems with your PICC, such as  your PICC: Was tugged or pulled and has partially come out. Do not  push the PICC back in. Cannot be flushed, is hard to flush, or leaks around the insertion site when it is flushed. Makes a flushing sound when it is flushed. Appears to have a hole or tear. Is accidentally pulled all the way out. If this happens, cover the insertion site with a gauze dressing. Do not throw the PICC away. Your health care provider will need to check it to be sure the entire catheter came out. You feel your heart racing or skipping beats, or you have chest pain. You have shortness of breath or trouble breathing. You have swelling, redness, warmth, or pain in the arm in which the PICC is placed. You have a red streak going up your arm that  starts under the PICC dressing. These symptoms may be an emergency. Get help right away. Call 911. Do not wait to see if the symptoms will go away. Do not drive yourself to the hospital. Summary A peripherally inserted central catheter (PICC) is a long, thin, flexible tube (catheter) that is put into a vein in the arm or leg. If cared for properly, a PICC can remain in place for many months. Having a PICC can allow you to go home from the hospital sooner and continue treatment at home. The PICC is inserted using a germ-free (sterile) technique by a specially trained health care provider. Only a trained health care provider should remove it. Do not have your blood pressure checked on the arm in which your PICC is placed. Always keep your PICC identification card with you. This information is not intended to replace advice given to you by your health care provider. Make sure you discuss any questions you have with your health care provider. Document Revised: 07/01/2021 Document Reviewed: 07/01/2021 Elsevier Patient Education  2024 [employer].

## 2023-10-07 ENCOUNTER — Encounter: Payer: Self-pay | Admitting: Family Medicine

## 2023-10-12 ENCOUNTER — Inpatient Hospital Stay: Payer: Medicare PPO

## 2023-10-12 ENCOUNTER — Other Ambulatory Visit: Payer: Self-pay | Admitting: Family Medicine

## 2023-10-12 ENCOUNTER — Encounter: Payer: Self-pay | Admitting: Hematology & Oncology

## 2023-10-12 ENCOUNTER — Inpatient Hospital Stay (HOSPITAL_BASED_OUTPATIENT_CLINIC_OR_DEPARTMENT_OTHER): Payer: Medicare PPO | Admitting: Hematology & Oncology

## 2023-10-12 VITALS — BP 121/50 | HR 76 | Temp 98.6°F | Resp 19 | Ht 66.0 in | Wt 156.0 lb

## 2023-10-12 DIAGNOSIS — C50911 Malignant neoplasm of unspecified site of right female breast: Secondary | ICD-10-CM

## 2023-10-12 DIAGNOSIS — C771 Secondary and unspecified malignant neoplasm of intrathoracic lymph nodes: Secondary | ICD-10-CM | POA: Diagnosis not present

## 2023-10-12 DIAGNOSIS — E785 Hyperlipidemia, unspecified: Secondary | ICD-10-CM | POA: Diagnosis not present

## 2023-10-12 DIAGNOSIS — C7951 Secondary malignant neoplasm of bone: Secondary | ICD-10-CM

## 2023-10-12 DIAGNOSIS — E559 Vitamin D deficiency, unspecified: Secondary | ICD-10-CM | POA: Diagnosis not present

## 2023-10-12 DIAGNOSIS — Z7982 Long term (current) use of aspirin: Secondary | ICD-10-CM | POA: Diagnosis not present

## 2023-10-12 DIAGNOSIS — Z79818 Long term (current) use of other agents affecting estrogen receptors and estrogen levels: Secondary | ICD-10-CM | POA: Diagnosis not present

## 2023-10-12 DIAGNOSIS — I10 Essential (primary) hypertension: Secondary | ICD-10-CM | POA: Diagnosis not present

## 2023-10-12 DIAGNOSIS — Z170 Estrogen receptor positive status [ER+]: Secondary | ICD-10-CM | POA: Diagnosis not present

## 2023-10-12 DIAGNOSIS — Z452 Encounter for adjustment and management of vascular access device: Secondary | ICD-10-CM | POA: Diagnosis not present

## 2023-10-12 DIAGNOSIS — Z79899 Other long term (current) drug therapy: Secondary | ICD-10-CM | POA: Diagnosis not present

## 2023-10-12 DIAGNOSIS — Z5111 Encounter for antineoplastic chemotherapy: Secondary | ICD-10-CM | POA: Diagnosis not present

## 2023-10-12 DIAGNOSIS — C50919 Malignant neoplasm of unspecified site of unspecified female breast: Secondary | ICD-10-CM

## 2023-10-12 LAB — CMP (CANCER CENTER ONLY)
ALT: 29 U/L (ref 0–44)
AST: 22 U/L (ref 15–41)
Albumin: 3.3 g/dL — ABNORMAL LOW (ref 3.5–5.0)
Alkaline Phosphatase: 171 U/L — ABNORMAL HIGH (ref 38–126)
Anion gap: 8 (ref 5–15)
BUN: 16 mg/dL (ref 8–23)
CO2: 29 mmol/L (ref 22–32)
Calcium: 8.8 mg/dL — ABNORMAL LOW (ref 8.9–10.3)
Chloride: 102 mmol/L (ref 98–111)
Creatinine: 1.3 mg/dL — ABNORMAL HIGH (ref 0.44–1.00)
GFR, Estimated: 44 mL/min — ABNORMAL LOW (ref >60)
Glucose, Bld: 100 mg/dL — ABNORMAL HIGH (ref 70–99)
Potassium: 4.3 mmol/L (ref 3.5–5.1)
Sodium: 139 mmol/L (ref 135–145)
Total Bilirubin: 0.4 mg/dL (ref 0.3–1.2)
Total Protein: 6.2 g/dL — ABNORMAL LOW (ref 6.5–8.1)

## 2023-10-12 LAB — CBC WITH DIFFERENTIAL (CANCER CENTER ONLY)
Abs Immature Granulocytes: 0.02 10*3/uL (ref 0.00–0.07)
Basophils Absolute: 0 10*3/uL (ref 0.0–0.1)
Basophils Relative: 1 %
Eosinophils Absolute: 0 10*3/uL (ref 0.0–0.5)
Eosinophils Relative: 1 %
HCT: 28.9 % — ABNORMAL LOW (ref 36.0–46.0)
Hemoglobin: 10 g/dL — ABNORMAL LOW (ref 12.0–15.0)
Immature Granulocytes: 1 %
Lymphocytes Relative: 16 %
Lymphs Abs: 0.5 10*3/uL — ABNORMAL LOW (ref 0.7–4.0)
MCH: 38.3 pg — ABNORMAL HIGH (ref 26.0–34.0)
MCHC: 34.6 g/dL (ref 30.0–36.0)
MCV: 110.7 fL — ABNORMAL HIGH (ref 80.0–100.0)
Monocytes Absolute: 0.3 10*3/uL (ref 0.1–1.0)
Monocytes Relative: 9 %
Neutro Abs: 2.1 10*3/uL (ref 1.7–7.7)
Neutrophils Relative %: 72 %
Platelet Count: 334 10*3/uL (ref 150–400)
RBC: 2.61 MIL/uL — ABNORMAL LOW (ref 3.87–5.11)
RDW: 12.8 % (ref 11.5–15.5)
WBC Count: 2.9 10*3/uL — ABNORMAL LOW (ref 4.0–10.5)
nRBC: 0 % (ref 0.0–0.2)

## 2023-10-12 MED ORDER — HEPARIN SOD (PORK) LOCK FLUSH 100 UNIT/ML IV SOLN
500.0000 [IU] | Freq: Once | INTRAVENOUS | Status: AC | PRN
  Administered 2023-10-12: 500 [IU]

## 2023-10-12 MED ORDER — SODIUM CHLORIDE 0.9% FLUSH: 3.0000 mL | Freq: Once | INTRAVENOUS | Status: DC | PRN

## 2023-10-12 MED ORDER — FULVESTRANT 250 MG/5ML IM SOSY
500.0000 mg | PREFILLED_SYRINGE | Freq: Once | INTRAMUSCULAR | Status: DC
  Administered 2023-10-12: 500 mg via INTRAMUSCULAR

## 2023-10-12 MED ORDER — HEPARIN SOD (PORK) LOCK FLUSH 100 UNIT/ML IV SOLN: 250.0000 [IU] | Freq: Once | INTRAVENOUS | Status: DC | PRN

## 2023-10-12 MED ORDER — SODIUM CHLORIDE 0.9% FLUSH
10.0000 mL | Freq: Once | INTRAVENOUS | Status: AC | PRN
  Administered 2023-10-12: 10 mL

## 2023-10-12 MED ORDER — ALTEPLASE 2 MG IJ SOLR: 2.0000 mg | Freq: Once | INTRAMUSCULAR | Status: DC | PRN

## 2023-10-12 NOTE — Patient Instructions (Signed)

## 2023-10-12 NOTE — Addendum Note (Signed)
Addended by: Charlette Caffey on: 10/12/2023 03:38 PM   Modules accepted: Orders

## 2023-10-12 NOTE — Progress Notes (Signed)
Hematology and Oncology Follow Up Visit  Tracy Thompson [id number] [birth year] 72 y.o. 10/12/2023   Principle Diagnosis:  Metastatic breast cancer-bone metastasis/hilar lymph node metastasis- ER+/PR+/HER-2 (2+) -- PIK3CA (+)  Current Therapy:   Faslodex 500 mg IM monthly -start on 11/09/2022 Ribociclib 600 mg p.o. daily (21/7) --start on 11/09/2022 --changed to 400 mg p.o. daily started on 09/05/2023 Zometa 4 mg IV every 3 months -next dose on 10/2023 Radiation therapy to left hip -completed in 11/23/2022     Interim History:  Tracy Thompson is back for follow-up.  She is doing pretty well.  She really has no specific complaints.  Thankfully, her last CA 27.29 was coming down.  It was 31.  She has had no problems with nausea or vomiting.  She has had no cough or shortness of breath.  She has had no change in bowel or bladder habits.  She has really done nicely with the lower dose of ribociclib.  She has had no rashes.  There has been no leg swelling.    Overall, I would say that her performance status is probably ECOG 1.   Medications:  Current Outpatient Medications:    amLODipine (NORVASC) 2.5 MG tablet, Take 1 pill a day., Disp: 90 tablet, Rfl: 1   aspirin 81 MG tablet, Take 81 mg by mouth daily., Disp: , Rfl:    bisoprolol-hydrochlorothiazide (ZIAC) 10-6.25 MG tablet, Take 1 tablet by mouth daily., Disp: 90 tablet, Rfl: 1   Cholecalciferol (VITAMIN D) 50 MCG (2000 UT) CAPS, Take 2,000 Units by mouth daily., Disp: , Rfl:    Heparin Na, Pork, Lock Flsh PF 100 UNIT/ML SOLN, Inject 2.5 mLs (250 Units total) into the vein every 12 (twelve) hours., Disp: 300 mL, Rfl: 3   meclizine (ANTIVERT) 12.5 MG tablet, Take 1 tablet (12.5 mg total) by mouth 3 (three) times daily as needed for dizziness., Disp: 30 tablet, Rfl: 4   OLANZapine (ZYPREXA) 5 MG tablet, TAKE 1 TABLET(5 MG) BY MOUTH AT BEDTIME, Disp: 30 tablet, Rfl: 3   ondansetron (ZOFRAN) 8 MG tablet, TAKE 1 TABLET(8 MG) BY MOUTH EVERY 8  HOURS AS NEEDED FOR NAUSEA OR VOMITING, Disp: 30 tablet, Rfl: 2   potassium chloride SA (KLOR-CON M) 20 MEQ tablet, Take 1 tablet (20 mEq total) by mouth 2 (two) times daily., Disp: 60 tablet, Rfl: 2   ribociclib succ (KISQALI 600MG  DAILY DOSE) 200 MG Therapy Pack, Take 2 tablets (400 mg total) by mouth daily. Take for 21 days on, 7 days off, repeat every 28 days., Disp: , Rfl:    sodium chloride flush 0.9 % SOLN injection, Inject 10 mLs into the vein every 12 (twelve) hours., Disp: 600 mL, Rfl: 3   traMADol (ULTRAM) 50 MG tablet, Take 1 tablet (50 mg total) by mouth every 6 (six) hours as needed (mild pain)., Disp: 10 tablet, Rfl: 0   zoledronic acid (ZOMETA) 4 MG/5ML injection, Inject 4 mg into the vein every 3 (three) months., Disp: , Rfl:    hydrochlorothiazide (HYDRODIURIL) 25 MG tablet, TAKE 1 TABLET(25 MG) BY MOUTH DAILY (Patient not taking: Reported on 09/14/2023), Disp: 90 tablet, Rfl: 1  Allergies: No Known Allergies  Past Medical History, Surgical history, Social history, and Family History were reviewed and updated.  Review of Systems: Review of Systems  Constitutional:  Positive for fatigue.  HENT:  Negative.    Eyes: Negative.   Respiratory: Negative.    Cardiovascular: Negative.   Gastrointestinal:  Positive for constipation and nausea.  Endocrine: Negative.   Genitourinary: Negative.    Musculoskeletal:  Positive for arthralgias and myalgias.  Skin: Negative.   Neurological: Negative.   Hematological: Negative.   Psychiatric/Behavioral: Negative.      Physical Exam: Vital signs show temperature of 98 4.4.  Pulse 100.  Blood pressure 156/92.  Weight is 167 pounds.  Wt Readings from Last 3 Encounters:  10/12/23 156 lb (70.8 kg)  09/30/23 158 lb 6.4 oz (71.8 kg)  09/14/23 157 lb (71.2 kg)    Physical Exam Vitals reviewed.  Constitutional:      Comments: Left breast shows no masses, edema or erythema.  There is no left axillary adenopathy.  Her right chest wall  shows a well-healing mastectomy.  There is no erythema or warmth.  There is no right axillary adenopathy.  Marland Kitchen  HENT:     Head: Normocephalic and atraumatic.  Eyes:     Pupils: Pupils are equal, round, and reactive to light.  Cardiovascular:     Rate and Rhythm: Normal rate and regular rhythm.     Heart sounds: Normal heart sounds.  Pulmonary:     Effort: Pulmonary effort is normal.     Breath sounds: Normal breath sounds.  Abdominal:     General: Bowel sounds are normal.     Palpations: Abdomen is soft.  Musculoskeletal:        General: No tenderness or deformity. Normal range of motion.     Cervical back: Normal range of motion.  Lymphadenopathy:     Cervical: No cervical adenopathy.  Skin:    General: Skin is warm and dry.     Findings: No erythema or rash.  Neurological:     Mental Status: She is alert and oriented to person, place, and time.  Psychiatric:        Behavior: Behavior normal.        Thought Content: Thought content normal.        Judgment: Judgment normal.     Lab Results  Component Value Date   WBC 2.9 (L) 10/12/2023   HGB 10.0 (L) 10/12/2023   HCT 28.9 (L) 10/12/2023   MCV 110.7 (H) 10/12/2023   PLT 334 10/12/2023     Chemistry      Component Value Date/Time   NA 139 10/12/2023 1440   K 4.3 10/12/2023 1440   CL 102 10/12/2023 1440   CO2 29 10/12/2023 1440   BUN 16 10/12/2023 1440   CREATININE 1.30 (H) 10/12/2023 1440      Component Value Date/Time   CALCIUM 8.8 (L) 10/12/2023 1440   ALKPHOS 171 (H) 10/12/2023 1440   AST 22 10/12/2023 1440   ALT 29 10/12/2023 1440   BILITOT 0.4 10/12/2023 1440       Impression and Plan: Tracy Thompson is a very nice 72 year old white female.  She has metastatic breast cancer.  We have her on antiestrogen therapy.  It will be interesting to see what her CA 27-29 is.  Hopefully, this will be down a little bit more.  I am not sure as to why her alkaline phosphatase is high.  She has no bony complaints.  Her LFT  enzymes are normal.  We will just have to see how everything looks when we see her back.  We will go with her Faslodex today.  She is not due for Zometa until November.  We will go ahead and get her back in 1 month.  Her last PET scan was done on 08/09/2023.  She  probably is not due for another 1 probably until December.  Josph Macho, MD 10/16/20243:10 PM

## 2023-10-12 NOTE — Patient Instructions (Signed)
PICC Home Care Guide A peripherally inserted central catheter (PICC) is a form of IV access that allows medicines and IV fluids to be quickly put into the blood and spread throughout the body. The PICC is a long, thin, flexible tube (catheter) that is put into a vein in a person's arm or leg. The catheter ends in a large vein just outside the heart called the superior vena cava (SVC). After the PICC is put in, a chest X-ray may be done to make sure that it is in the right place. A PICC may be placed for different reasons, such as: To give medicines and liquid nutrition. To give IV fluids and blood products. To take blood samples often. If there is trouble placing a peripheral intravenous (PIV) catheter. If cared for properly, a PICC can remain in place for many months. Having a PICC can allow you to go home from the hospital sooner and continue treatment at home. Medicines and PICC care can be managed at home by a family member, caregiver, or home health care team. What are the risks? Generally, having a PICC is safe. However, problems may occur, including: A blood clot (thrombus) forming in or at the end of the PICC. A blood clot forming in a vein (deep vein thrombosis) or traveling to the lung (pulmonary embolism). Inflammation of the vein (phlebitis) in which the PICC is placed. Infection at the insertion site or in the blood. Blood infections from central lines, like PICCs, can be serious and often require a hospital stay. PICC malposition, or PICC movement or poor placement. A break or cut in the PICC. Do not use scissors near the PICC. Nerve or tendon irritation or injury during PICC insertion. How to care for your PICC Please follow the specific guidelines provided by your health care provider. Preventing infection You and any caregivers should wash your hands often with soap and water for at least 20 seconds. Wash hands: Before touching the PICC or the infusion device. Before changing a  bandage (dressing). Do not change the dressing unless you have been taught to do so and have shown you are able to change it safely. Flush the PICC as told. Tell your health care provider right away if the PICC is hard to flush or does not flush. Do not use force to flush the PICC. Use clean and germ-free (sterile) supplies only. Keep the supplies in a dry place. Do not reuse needles, syringes, or any other supplies. Reusing supplies can lead to infection. Keep the PICC dressing dry and secure it with tape if the edges stop sticking to your skin. Check your PICC insertion site every day for signs of infection. Check for: Redness, swelling, or pain. Fluid or blood. Warmth. Pus or a bad smell. Preventing other problems Do not use a syringe that is less than 10 mL to flush the PICC. Do not have your blood pressure checked on the arm in which the PICC is placed. Do not ever pull or tug on the PICC. Keep it secured to your arm with tape or a stretch wrap when not in use. Do not take the PICC out yourself. Only a trained health care provider should remove the PICC. Keep pets and children away from your PICC. How to care for your PICC dressing Keep your PICC dressing clean and dry to prevent infection. Do not take baths, swim, or use a hot tub until your health care provider approves. Ask your health care provider if you can take  showers. You may only be allowed to take sponge baths. When you are allowed to shower: Ask your health care provider to teach you how to wrap the PICC. Cover the PICC with clear plastic wrap and tape to keep it dry while showering. Follow instructions from your health care provider about how to take care of your insertion site and dressing. Make sure you: Wash your hands with soap and water for at least 20 seconds before and after you change your dressing. If soap and water are not available, use hand sanitizer. Change your dressing only if taught to do so by your health care  provider. Your PICC dressing needs to be changed if it becomes loose or wet. Leave stitches (sutures), skin glue, or adhesive strips in place. These skin closures may need to stay in place for 2 weeks or longer. If adhesive strip edges start to loosen and curl up, you may trim the loose edges. Do not remove adhesive strips completely unless your health care provider tells you to do that. Follow these instructions at home: Disposal of supplies Throw away any syringes in a disposal container that is meant for sharp items (sharps container). You can buy a sharps container from a pharmacy, or you can make one by using an empty, hard plastic bottle with a lid. Place any used dressings or infusion bags into a plastic bag. Throw that bag in the trash. General instructions  Always carry your PICC identification card or wear a medical alert bracelet. Keep the tube clamped at all times, unless it is being used. Always carry a smooth-edge clamp with you to clamp the PICC if it breaks. Do not use scissors or sharp objects near the tube. You may bend your arm and move it freely. If your PICC is near or at the bend of your elbow, avoid activity with repeated motion at the elbow. Avoid lifting heavy objects as told by your health care provider. Keep all follow-up visits. This is important. You will need to have your PICC dressing changed at least once a week. Contact a health care provider if: You have pain in your arm, ear, face, or teeth. You have a fever or chills. You have redness, swelling, or pain around the insertion site. You have fluid or blood coming from the insertion site. Your insertion site feels warm to the touch. You have pus or a bad smell coming from the insertion site. Your skin feels hard and raised around the insertion site. Your PICC dressing has gotten wet or is coming off and you have not been taught how to change it. Get help right away if: You have problems with your PICC, such as  your PICC: Was tugged or pulled and has partially come out. Do not  push the PICC back in. Cannot be flushed, is hard to flush, or leaks around the insertion site when it is flushed. Makes a flushing sound when it is flushed. Appears to have a hole or tear. Is accidentally pulled all the way out. If this happens, cover the insertion site with a gauze dressing. Do not throw the PICC away. Your health care provider will need to check it to be sure the entire catheter came out. You feel your heart racing or skipping beats, or you have chest pain. You have shortness of breath or trouble breathing. You have swelling, redness, warmth, or pain in the arm in which the PICC is placed. You have a red streak going up your arm that  starts under the PICC dressing. These symptoms may be an emergency. Get help right away. Call 911. Do not wait to see if the symptoms will go away. Do not drive yourself to the hospital. Summary A peripherally inserted central catheter (PICC) is a long, thin, flexible tube (catheter) that is put into a vein in the arm or leg. If cared for properly, a PICC can remain in place for many months. Having a PICC can allow you to go home from the hospital sooner and continue treatment at home. The PICC is inserted using a germ-free (sterile) technique by a specially trained health care provider. Only a trained health care provider should remove it. Do not have your blood pressure checked on the arm in which your PICC is placed. Always keep your PICC identification card with you. This information is not intended to replace advice given to you by your health care provider. Make sure you discuss any questions you have with your health care provider. Document Revised: 07/01/2021 Document Reviewed: 07/01/2021 Elsevier Patient Education  2024 [employer].

## 2023-10-13 ENCOUNTER — Other Ambulatory Visit: Payer: Self-pay

## 2023-10-13 ENCOUNTER — Encounter: Payer: Self-pay | Admitting: *Deleted

## 2023-10-13 LAB — LIPID PANEL W/O CHOL/HDL RATIO
Cholesterol, Total: 207 mg/dL — ABNORMAL HIGH (ref 100–199)
HDL: 48 mg/dL (ref >39)
LDL Chol Calc (NIH): 140 mg/dL — ABNORMAL HIGH (ref 0–99)
Triglycerides: 104 mg/dL (ref 0–149)
VLDL Cholesterol Cal: 19 mg/dL (ref 5–40)

## 2023-10-13 LAB — CANCER ANTIGEN 27.29: CA 27.29: 31.2 U/mL (ref 0.0–38.6)

## 2023-10-13 LAB — TSH: TSH: 3.72 u[IU]/mL (ref 0.450–4.500)

## 2023-10-13 LAB — VITAMIN D 25 HYDROXY (VIT D DEFICIENCY, FRACTURES): Vit D, 25-Hydroxy: 49.4 ng/mL (ref 30.0–100.0)

## 2023-10-13 NOTE — Progress Notes (Signed)
Patient continues to do well on maintenance. She has not had any navigational needs for some time. Will discontinue active navigation, but be available to the patient as needed in the future.   Oncology Nurse Navigator Documentation     10/13/2023    8:15 AM  Oncology Nurse Navigator Flowsheets  Navigation Complete Date: 10/13/2023  Post Navigation: Continue to Follow Patient? No  Reason Not Navigating Patient: Patient On Maintenance Chemotherapy  Navigator Location [facility]  Navigator Encounter Type Appt/Treatment Plan Review  Patient Visit Type MedOnc  Treatment Phase Active Tx  Barriers/Navigation Needs No Barriers At This Time  Interventions None Required  Acuity Level 1-No Barriers  Support Groups/Services Friends and Family  Time Spent with Patient 15

## 2023-10-14 ENCOUNTER — Other Ambulatory Visit: Payer: Self-pay

## 2023-10-14 ENCOUNTER — Other Ambulatory Visit: Payer: Self-pay | Admitting: Hematology & Oncology

## 2023-10-14 DIAGNOSIS — C50911 Malignant neoplasm of unspecified site of right female breast: Secondary | ICD-10-CM

## 2023-10-14 MED ORDER — KISQALI (400 MG DOSE) 200 MG PO TBPK
400.0000 mg | ORAL_TABLET | Freq: Every day | ORAL | 6 refills | Status: DC
Start: 2023-10-14 — End: 2024-07-23
  Filled 2023-10-14: qty 42, 28d supply, fill #0
  Filled 2023-11-03: qty 42, 28d supply, fill #1
  Filled 2023-12-12: qty 42, 28d supply, fill #2
  Filled 2024-01-11: qty 42, 28d supply, fill #3
  Filled 2024-02-08 – 2024-02-13 (×3): qty 42, 28d supply, fill #4
  Filled 2024-03-05 – 2024-03-06 (×2): qty 42, 28d supply, fill #5
  Filled 2024-04-04: qty 42, 28d supply, fill #6
  Filled 2024-05-01: qty 42, 28d supply, fill #7
  Filled 2024-05-31: qty 42, 28d supply, fill #8
  Filled 2024-06-25: qty 42, 28d supply, fill #9

## 2023-10-14 NOTE — Progress Notes (Signed)
Specialty Pharmacy Refill Coordination Note  Tracy Thompson is a 72 y.o. female contacted today regarding refills of specialty medication(s) Ribociclib Succinate   Patient requested Delivery   Delivery date: 10/19/23   Verified address: [address]  [place] [facility] [id number]   Medication will be filled on 10/18/23 pending refill request.

## 2023-10-19 ENCOUNTER — Other Ambulatory Visit (HOSPITAL_COMMUNITY): Payer: Self-pay

## 2023-10-19 ENCOUNTER — Other Ambulatory Visit: Payer: Self-pay | Admitting: Family Medicine

## 2023-10-19 ENCOUNTER — Inpatient Hospital Stay: Payer: Medicare PPO

## 2023-10-19 ENCOUNTER — Other Ambulatory Visit: Payer: Self-pay

## 2023-10-19 VITALS — BP 124/62 | HR 80 | Temp 97.7°F | Resp 18

## 2023-10-19 DIAGNOSIS — Z452 Encounter for adjustment and management of vascular access device: Secondary | ICD-10-CM | POA: Diagnosis not present

## 2023-10-19 DIAGNOSIS — Z170 Estrogen receptor positive status [ER+]: Secondary | ICD-10-CM | POA: Diagnosis not present

## 2023-10-19 DIAGNOSIS — Z7982 Long term (current) use of aspirin: Secondary | ICD-10-CM | POA: Diagnosis not present

## 2023-10-19 DIAGNOSIS — C771 Secondary and unspecified malignant neoplasm of intrathoracic lymph nodes: Secondary | ICD-10-CM | POA: Diagnosis not present

## 2023-10-19 DIAGNOSIS — C50919 Malignant neoplasm of unspecified site of unspecified female breast: Secondary | ICD-10-CM

## 2023-10-19 DIAGNOSIS — E785 Hyperlipidemia, unspecified: Secondary | ICD-10-CM

## 2023-10-19 DIAGNOSIS — Z5111 Encounter for antineoplastic chemotherapy: Secondary | ICD-10-CM | POA: Diagnosis not present

## 2023-10-19 DIAGNOSIS — Z79899 Other long term (current) drug therapy: Secondary | ICD-10-CM | POA: Diagnosis not present

## 2023-10-19 DIAGNOSIS — C50911 Malignant neoplasm of unspecified site of right female breast: Secondary | ICD-10-CM | POA: Diagnosis not present

## 2023-10-19 DIAGNOSIS — C7951 Secondary malignant neoplasm of bone: Secondary | ICD-10-CM | POA: Diagnosis not present

## 2023-10-19 DIAGNOSIS — Z79818 Long term (current) use of other agents affecting estrogen receptors and estrogen levels: Secondary | ICD-10-CM | POA: Diagnosis not present

## 2023-10-19 MED ORDER — SODIUM CHLORIDE 0.9% FLUSH
10.0000 mL | Freq: Once | INTRAVENOUS | Status: AC | PRN
Start: 2023-10-19 — End: 2023-10-19
  Administered 2023-10-19: 10 mL

## 2023-10-19 MED ORDER — HEPARIN SOD (PORK) LOCK FLUSH 100 UNIT/ML IV SOLN
500.0000 [IU] | Freq: Once | INTRAVENOUS | Status: AC | PRN
  Administered 2023-10-19: 500 [IU]

## 2023-10-19 NOTE — Patient Instructions (Signed)
PICC Home Care Guide A peripherally inserted central catheter (PICC) is a form of IV access that allows medicines and IV fluids to be quickly put into the blood and spread throughout the body. The PICC is a long, thin, flexible tube (catheter) that is put into a vein in a person's arm or leg. The catheter ends in a large vein just outside the heart called the superior vena cava (SVC). After the PICC is put in, a chest X-ray may be done to make sure that it is in the right place. A PICC may be placed for different reasons, such as: To give medicines and liquid nutrition. To give IV fluids and blood products. To take blood samples often. If there is trouble placing a peripheral intravenous (PIV) catheter. If cared for properly, a PICC can remain in place for many months. Having a PICC can allow you to go home from the hospital sooner and continue treatment at home. Medicines and PICC care can be managed at home by a family member, caregiver, or home health care team. What are the risks? Generally, having a PICC is safe. However, problems may occur, including: A blood clot (thrombus) forming in or at the end of the PICC. A blood clot forming in a vein (deep vein thrombosis) or traveling to the lung (pulmonary embolism). Inflammation of the vein (phlebitis) in which the PICC is placed. Infection at the insertion site or in the blood. Blood infections from central lines, like PICCs, can be serious and often require a hospital stay. PICC malposition, or PICC movement or poor placement. A break or cut in the PICC. Do not use scissors near the PICC. Nerve or tendon irritation or injury during PICC insertion. How to care for your PICC Please follow the specific guidelines provided by your health care provider. Preventing infection You and any caregivers should wash your hands often with soap and water for at least 20 seconds. Wash hands: Before touching the PICC or the infusion device. Before changing a  bandage (dressing). Do not change the dressing unless you have been taught to do so and have shown you are able to change it safely. Flush the PICC as told. Tell your health care provider right away if the PICC is hard to flush or does not flush. Do not use force to flush the PICC. Use clean and germ-free (sterile) supplies only. Keep the supplies in a dry place. Do not reuse needles, syringes, or any other supplies. Reusing supplies can lead to infection. Keep the PICC dressing dry and secure it with tape if the edges stop sticking to your skin. Check your PICC insertion site every day for signs of infection. Check for: Redness, swelling, or pain. Fluid or blood. Warmth. Pus or a bad smell. Preventing other problems Do not use a syringe that is less than 10 mL to flush the PICC. Do not have your blood pressure checked on the arm in which the PICC is placed. Do not ever pull or tug on the PICC. Keep it secured to your arm with tape or a stretch wrap when not in use. Do not take the PICC out yourself. Only a trained health care provider should remove the PICC. Keep pets and children away from your PICC. How to care for your PICC dressing Keep your PICC dressing clean and dry to prevent infection. Do not take baths, swim, or use a hot tub until your health care provider approves. Ask your health care provider if you can take  showers. You may only be allowed to take sponge baths. When you are allowed to shower: Ask your health care provider to teach you how to wrap the PICC. Cover the PICC with clear plastic wrap and tape to keep it dry while showering. Follow instructions from your health care provider about how to take care of your insertion site and dressing. Make sure you: Wash your hands with soap and water for at least 20 seconds before and after you change your dressing. If soap and water are not available, use hand sanitizer. Change your dressing only if taught to do so by your health care  provider. Your PICC dressing needs to be changed if it becomes loose or wet. Leave stitches (sutures), skin glue, or adhesive strips in place. These skin closures may need to stay in place for 2 weeks or longer. If adhesive strip edges start to loosen and curl up, you may trim the loose edges. Do not remove adhesive strips completely unless your health care provider tells you to do that. Follow these instructions at home: Disposal of supplies Throw away any syringes in a disposal container that is meant for sharp items (sharps container). You can buy a sharps container from a pharmacy, or you can make one by using an empty, hard plastic bottle with a lid. Place any used dressings or infusion bags into a plastic bag. Throw that bag in the trash. General instructions  Always carry your PICC identification card or wear a medical alert bracelet. Keep the tube clamped at all times, unless it is being used. Always carry a smooth-edge clamp with you to clamp the PICC if it breaks. Do not use scissors or sharp objects near the tube. You may bend your arm and move it freely. If your PICC is near or at the bend of your elbow, avoid activity with repeated motion at the elbow. Avoid lifting heavy objects as told by your health care provider. Keep all follow-up visits. This is important. You will need to have your PICC dressing changed at least once a week. Contact a health care provider if: You have pain in your arm, ear, face, or teeth. You have a fever or chills. You have redness, swelling, or pain around the insertion site. You have fluid or blood coming from the insertion site. Your insertion site feels warm to the touch. You have pus or a bad smell coming from the insertion site. Your skin feels hard and raised around the insertion site. Your PICC dressing has gotten wet or is coming off and you have not been taught how to change it. Get help right away if: You have problems with your PICC, such as  your PICC: Was tugged or pulled and has partially come out. Do not  push the PICC back in. Cannot be flushed, is hard to flush, or leaks around the insertion site when it is flushed. Makes a flushing sound when it is flushed. Appears to have a hole or tear. Is accidentally pulled all the way out. If this happens, cover the insertion site with a gauze dressing. Do not throw the PICC away. Your health care provider will need to check it to be sure the entire catheter came out. You feel your heart racing or skipping beats, or you have chest pain. You have shortness of breath or trouble breathing. You have swelling, redness, warmth, or pain in the arm in which the PICC is placed. You have a red streak going up your arm that  starts under the PICC dressing. These symptoms may be an emergency. Get help right away. Call 911. Do not wait to see if the symptoms will go away. Do not drive yourself to the hospital. Summary A peripherally inserted central catheter (PICC) is a long, thin, flexible tube (catheter) that is put into a vein in the arm or leg. If cared for properly, a PICC can remain in place for many months. Having a PICC can allow you to go home from the hospital sooner and continue treatment at home. The PICC is inserted using a germ-free (sterile) technique by a specially trained health care provider. Only a trained health care provider should remove it. Do not have your blood pressure checked on the arm in which your PICC is placed. Always keep your PICC identification card with you. This information is not intended to replace advice given to you by your health care provider. Make sure you discuss any questions you have with your health care provider. Document Revised: 07/01/2021 Document Reviewed: 07/01/2021 Elsevier Patient Education  2024 [employer].

## 2023-10-24 ENCOUNTER — Other Ambulatory Visit (HOSPITAL_BASED_OUTPATIENT_CLINIC_OR_DEPARTMENT_OTHER): Payer: Self-pay

## 2023-10-24 ENCOUNTER — Other Ambulatory Visit: Payer: Self-pay

## 2023-10-27 ENCOUNTER — Other Ambulatory Visit: Payer: Self-pay

## 2023-10-27 ENCOUNTER — Inpatient Hospital Stay: Payer: Medicare PPO

## 2023-10-27 ENCOUNTER — Other Ambulatory Visit (HOSPITAL_BASED_OUTPATIENT_CLINIC_OR_DEPARTMENT_OTHER): Payer: Self-pay

## 2023-10-27 VITALS — BP 127/73 | HR 82 | Temp 97.8°F | Resp 20

## 2023-10-27 DIAGNOSIS — C50911 Malignant neoplasm of unspecified site of right female breast: Secondary | ICD-10-CM | POA: Diagnosis not present

## 2023-10-27 DIAGNOSIS — Z79818 Long term (current) use of other agents affecting estrogen receptors and estrogen levels: Secondary | ICD-10-CM | POA: Diagnosis not present

## 2023-10-27 DIAGNOSIS — Z170 Estrogen receptor positive status [ER+]: Secondary | ICD-10-CM | POA: Diagnosis not present

## 2023-10-27 DIAGNOSIS — Z79899 Other long term (current) drug therapy: Secondary | ICD-10-CM | POA: Diagnosis not present

## 2023-10-27 DIAGNOSIS — C50919 Malignant neoplasm of unspecified site of unspecified female breast: Secondary | ICD-10-CM

## 2023-10-27 DIAGNOSIS — Z5111 Encounter for antineoplastic chemotherapy: Secondary | ICD-10-CM | POA: Diagnosis not present

## 2023-10-27 DIAGNOSIS — C771 Secondary and unspecified malignant neoplasm of intrathoracic lymph nodes: Secondary | ICD-10-CM | POA: Diagnosis not present

## 2023-10-27 DIAGNOSIS — Z452 Encounter for adjustment and management of vascular access device: Secondary | ICD-10-CM | POA: Diagnosis not present

## 2023-10-27 DIAGNOSIS — Z7982 Long term (current) use of aspirin: Secondary | ICD-10-CM | POA: Diagnosis not present

## 2023-10-27 DIAGNOSIS — C7951 Secondary malignant neoplasm of bone: Secondary | ICD-10-CM | POA: Diagnosis not present

## 2023-10-27 MED ORDER — SODIUM CHLORIDE 0.9% FLUSH
10.0000 mL | Freq: Once | INTRAVENOUS | Status: AC | PRN
Start: 2023-10-27 — End: 2023-10-27
  Administered 2023-10-27: 10 mL

## 2023-10-27 MED ORDER — HEPARIN SOD (PORK) LOCK FLUSH 100 UNIT/ML IV SOLN
250.0000 [IU] | Freq: Once | INTRAVENOUS | Status: AC | PRN
  Administered 2023-10-27: 250 [IU]

## 2023-10-27 NOTE — Patient Instructions (Signed)
PICC Home Care Guide A peripherally inserted central catheter (PICC) is a form of IV access that allows medicines and IV fluids to be quickly put into the blood and spread throughout the body. The PICC is a long, thin, flexible tube (catheter) that is put into a vein in a person's arm or leg. The catheter ends in a large vein just outside the heart called the superior vena cava (SVC). After the PICC is put in, a chest X-ray may be done to make sure that it is in the right place. A PICC may be placed for different reasons, such as: To give medicines and liquid nutrition. To give IV fluids and blood products. To take blood samples often. If there is trouble placing a peripheral intravenous (PIV) catheter. If cared for properly, a PICC can remain in place for many months. Having a PICC can allow you to go home from the hospital sooner and continue treatment at home. Medicines and PICC care can be managed at home by a family member, caregiver, or home health care team. What are the risks? Generally, having a PICC is safe. However, problems may occur, including: A blood clot (thrombus) forming in or at the end of the PICC. A blood clot forming in a vein (deep vein thrombosis) or traveling to the lung (pulmonary embolism). Inflammation of the vein (phlebitis) in which the PICC is placed. Infection at the insertion site or in the blood. Blood infections from central lines, like PICCs, can be serious and often require a hospital stay. PICC malposition, or PICC movement or poor placement. A break or cut in the PICC. Do not use scissors near the PICC. Nerve or tendon irritation or injury during PICC insertion. How to care for your PICC Please follow the specific guidelines provided by your health care provider. Preventing infection You and any caregivers should wash your hands often with soap and water for at least 20 seconds. Wash hands: Before touching the PICC or the infusion device. Before changing a  bandage (dressing). Do not change the dressing unless you have been taught to do so and have shown you are able to change it safely. Flush the PICC as told. Tell your health care provider right away if the PICC is hard to flush or does not flush. Do not use force to flush the PICC. Use clean and germ-free (sterile) supplies only. Keep the supplies in a dry place. Do not reuse needles, syringes, or any other supplies. Reusing supplies can lead to infection. Keep the PICC dressing dry and secure it with tape if the edges stop sticking to your skin. Check your PICC insertion site every day for signs of infection. Check for: Redness, swelling, or pain. Fluid or blood. Warmth. Pus or a bad smell. Preventing other problems Do not use a syringe that is less than 10 mL to flush the PICC. Do not have your blood pressure checked on the arm in which the PICC is placed. Do not ever pull or tug on the PICC. Keep it secured to your arm with tape or a stretch wrap when not in use. Do not take the PICC out yourself. Only a trained health care provider should remove the PICC. Keep pets and children away from your PICC. How to care for your PICC dressing Keep your PICC dressing clean and dry to prevent infection. Do not take baths, swim, or use a hot tub until your health care provider approves. Ask your health care provider if you can take  showers. You may only be allowed to take sponge baths. When you are allowed to shower: Ask your health care provider to teach you how to wrap the PICC. Cover the PICC with clear plastic wrap and tape to keep it dry while showering. Follow instructions from your health care provider about how to take care of your insertion site and dressing. Make sure you: Wash your hands with soap and water for at least 20 seconds before and after you change your dressing. If soap and water are not available, use hand sanitizer. Change your dressing only if taught to do so by your health care  provider. Your PICC dressing needs to be changed if it becomes loose or wet. Leave stitches (sutures), skin glue, or adhesive strips in place. These skin closures may need to stay in place for 2 weeks or longer. If adhesive strip edges start to loosen and curl up, you may trim the loose edges. Do not remove adhesive strips completely unless your health care provider tells you to do that. Follow these instructions at home: Disposal of supplies Throw away any syringes in a disposal container that is meant for sharp items (sharps container). You can buy a sharps container from a pharmacy, or you can make one by using an empty, hard plastic bottle with a lid. Place any used dressings or infusion bags into a plastic bag. Throw that bag in the trash. General instructions  Always carry your PICC identification card or wear a medical alert bracelet. Keep the tube clamped at all times, unless it is being used. Always carry a smooth-edge clamp with you to clamp the PICC if it breaks. Do not use scissors or sharp objects near the tube. You may bend your arm and move it freely. If your PICC is near or at the bend of your elbow, avoid activity with repeated motion at the elbow. Avoid lifting heavy objects as told by your health care provider. Keep all follow-up visits. This is important. You will need to have your PICC dressing changed at least once a week. Contact a health care provider if: You have pain in your arm, ear, face, or teeth. You have a fever or chills. You have redness, swelling, or pain around the insertion site. You have fluid or blood coming from the insertion site. Your insertion site feels warm to the touch. You have pus or a bad smell coming from the insertion site. Your skin feels hard and raised around the insertion site. Your PICC dressing has gotten wet or is coming off and you have not been taught how to change it. Get help right away if: You have problems with your PICC, such as  your PICC: Was tugged or pulled and has partially come out. Do not  push the PICC back in. Cannot be flushed, is hard to flush, or leaks around the insertion site when it is flushed. Makes a flushing sound when it is flushed. Appears to have a hole or tear. Is accidentally pulled all the way out. If this happens, cover the insertion site with a gauze dressing. Do not throw the PICC away. Your health care provider will need to check it to be sure the entire catheter came out. You feel your heart racing or skipping beats, or you have chest pain. You have shortness of breath or trouble breathing. You have swelling, redness, warmth, or pain in the arm in which the PICC is placed. You have a red streak going up your arm that  starts under the PICC dressing. These symptoms may be an emergency. Get help right away. Call 911. Do not wait to see if the symptoms will go away. Do not drive yourself to the hospital. Summary A peripherally inserted central catheter (PICC) is a long, thin, flexible tube (catheter) that is put into a vein in the arm or leg. If cared for properly, a PICC can remain in place for many months. Having a PICC can allow you to go home from the hospital sooner and continue treatment at home. The PICC is inserted using a germ-free (sterile) technique by a specially trained health care provider. Only a trained health care provider should remove it. Do not have your blood pressure checked on the arm in which your PICC is placed. Always keep your PICC identification card with you. This information is not intended to replace advice given to you by your health care provider. Make sure you discuss any questions you have with your health care provider. Document Revised: 07/01/2021 Document Reviewed: 07/01/2021 Elsevier Patient Education  2024 [employer].

## 2023-10-28 ENCOUNTER — Other Ambulatory Visit (HOSPITAL_BASED_OUTPATIENT_CLINIC_OR_DEPARTMENT_OTHER): Payer: Self-pay

## 2023-11-02 ENCOUNTER — Inpatient Hospital Stay: Payer: Medicare PPO | Attending: Hematology & Oncology

## 2023-11-02 VITALS — BP 101/79 | HR 78 | Temp 97.5°F | Resp 18

## 2023-11-02 DIAGNOSIS — Z7982 Long term (current) use of aspirin: Secondary | ICD-10-CM | POA: Diagnosis not present

## 2023-11-02 DIAGNOSIS — Z95828 Presence of other vascular implants and grafts: Secondary | ICD-10-CM

## 2023-11-02 DIAGNOSIS — C771 Secondary and unspecified malignant neoplasm of intrathoracic lymph nodes: Secondary | ICD-10-CM | POA: Insufficient documentation

## 2023-11-02 DIAGNOSIS — C50911 Malignant neoplasm of unspecified site of right female breast: Secondary | ICD-10-CM | POA: Insufficient documentation

## 2023-11-02 DIAGNOSIS — Z5111 Encounter for antineoplastic chemotherapy: Secondary | ICD-10-CM | POA: Diagnosis not present

## 2023-11-02 DIAGNOSIS — Z170 Estrogen receptor positive status [ER+]: Secondary | ICD-10-CM | POA: Diagnosis not present

## 2023-11-02 DIAGNOSIS — D709 Neutropenia, unspecified: Secondary | ICD-10-CM | POA: Diagnosis not present

## 2023-11-02 DIAGNOSIS — Z79899 Other long term (current) drug therapy: Secondary | ICD-10-CM | POA: Diagnosis not present

## 2023-11-02 DIAGNOSIS — C7951 Secondary malignant neoplasm of bone: Secondary | ICD-10-CM | POA: Insufficient documentation

## 2023-11-02 DIAGNOSIS — Z452 Encounter for adjustment and management of vascular access device: Secondary | ICD-10-CM | POA: Insufficient documentation

## 2023-11-02 MED ORDER — SODIUM CHLORIDE 0.9% FLUSH
10.0000 mL | INTRAVENOUS | Status: DC | PRN
  Administered 2023-11-02: 10 mL via INTRAVENOUS

## 2023-11-02 MED ORDER — HEPARIN SOD (PORK) LOCK FLUSH 100 UNIT/ML IV SOLN
500.0000 [IU] | Freq: Once | INTRAVENOUS | Status: AC
  Administered 2023-11-02: 300 [IU] via INTRAVENOUS

## 2023-11-03 ENCOUNTER — Other Ambulatory Visit: Payer: Self-pay

## 2023-11-03 ENCOUNTER — Other Ambulatory Visit (HOSPITAL_COMMUNITY): Payer: Self-pay

## 2023-11-03 ENCOUNTER — Other Ambulatory Visit (HOSPITAL_COMMUNITY): Payer: Self-pay | Admitting: Pharmacy Technician

## 2023-11-03 NOTE — Progress Notes (Signed)
Specialty Pharmacy Refill Coordination Note  Tracy Thompson is a 72 y.o. female contacted today regarding refills of specialty medication(s) Ribociclib Succinate   Patient requested Delivery   Delivery date: 11/16/23   Verified address: [address]  [place]   Medication will be filled on 11/15/23.

## 2023-11-08 ENCOUNTER — Other Ambulatory Visit: Payer: Self-pay | Admitting: Hematology & Oncology

## 2023-11-09 ENCOUNTER — Inpatient Hospital Stay: Payer: Medicare PPO

## 2023-11-09 VITALS — BP 122/65 | HR 76 | Temp 97.5°F | Resp 18

## 2023-11-09 DIAGNOSIS — D709 Neutropenia, unspecified: Secondary | ICD-10-CM | POA: Diagnosis not present

## 2023-11-09 DIAGNOSIS — C7951 Secondary malignant neoplasm of bone: Secondary | ICD-10-CM | POA: Diagnosis not present

## 2023-11-09 DIAGNOSIS — Z5111 Encounter for antineoplastic chemotherapy: Secondary | ICD-10-CM | POA: Diagnosis not present

## 2023-11-09 DIAGNOSIS — Z452 Encounter for adjustment and management of vascular access device: Secondary | ICD-10-CM | POA: Diagnosis not present

## 2023-11-09 DIAGNOSIS — Z79899 Other long term (current) drug therapy: Secondary | ICD-10-CM | POA: Diagnosis not present

## 2023-11-09 DIAGNOSIS — Z170 Estrogen receptor positive status [ER+]: Secondary | ICD-10-CM | POA: Diagnosis not present

## 2023-11-09 DIAGNOSIS — C50911 Malignant neoplasm of unspecified site of right female breast: Secondary | ICD-10-CM | POA: Diagnosis not present

## 2023-11-09 DIAGNOSIS — C50919 Malignant neoplasm of unspecified site of unspecified female breast: Secondary | ICD-10-CM

## 2023-11-09 DIAGNOSIS — C771 Secondary and unspecified malignant neoplasm of intrathoracic lymph nodes: Secondary | ICD-10-CM | POA: Diagnosis not present

## 2023-11-09 DIAGNOSIS — Z7982 Long term (current) use of aspirin: Secondary | ICD-10-CM | POA: Diagnosis not present

## 2023-11-09 MED ORDER — HEPARIN SOD (PORK) LOCK FLUSH 100 UNIT/ML IV SOLN
500.0000 [IU] | Freq: Once | INTRAVENOUS | Status: AC
  Administered 2023-11-09: 500 [IU] via INTRAVENOUS

## 2023-11-09 MED ORDER — SODIUM CHLORIDE 0.9% FLUSH
10.0000 mL | INTRAVENOUS | Status: DC | PRN
  Administered 2023-11-09: 10 mL via INTRAVENOUS

## 2023-11-09 NOTE — Patient Instructions (Signed)
PICC Home Care Guide A peripherally inserted central catheter (PICC) is a form of IV access that allows medicines and IV fluids to be quickly put into the blood and spread throughout the body. The PICC is a long, thin, flexible tube (catheter) that is put into a vein in a person's arm or leg. The catheter ends in a large vein just outside the heart called the superior vena cava (SVC). After the PICC is put in, a chest X-ray may be done to make sure that it is in the right place. A PICC may be placed for different reasons, such as: To give medicines and liquid nutrition. To give IV fluids and blood products. To take blood samples often. If there is trouble placing a peripheral intravenous (PIV) catheter. If cared for properly, a PICC can remain in place for many months. Having a PICC can allow you to go home from the hospital sooner and continue treatment at home. Medicines and PICC care can be managed at home by a family member, caregiver, or home health care team. What are the risks? Generally, having a PICC is safe. However, problems may occur, including: A blood clot (thrombus) forming in or at the end of the PICC. A blood clot forming in a vein (deep vein thrombosis) or traveling to the lung (pulmonary embolism). Inflammation of the vein (phlebitis) in which the PICC is placed. Infection at the insertion site or in the blood. Blood infections from central lines, like PICCs, can be serious and often require a hospital stay. PICC malposition, or PICC movement or poor placement. A break or cut in the PICC. Do not use scissors near the PICC. Nerve or tendon irritation or injury during PICC insertion. How to care for your PICC Please follow the specific guidelines provided by your health care provider. Preventing infection You and any caregivers should wash your hands often with soap and water for at least 20 seconds. Wash hands: Before touching the PICC or the infusion device. Before changing a  bandage (dressing). Do not change the dressing unless you have been taught to do so and have shown you are able to change it safely. Flush the PICC as told. Tell your health care provider right away if the PICC is hard to flush or does not flush. Do not use force to flush the PICC. Use clean and germ-free (sterile) supplies only. Keep the supplies in a dry place. Do not reuse needles, syringes, or any other supplies. Reusing supplies can lead to infection. Keep the PICC dressing dry and secure it with tape if the edges stop sticking to your skin. Check your PICC insertion site every day for signs of infection. Check for: Redness, swelling, or pain. Fluid or blood. Warmth. Pus or a bad smell. Preventing other problems Do not use a syringe that is less than 10 mL to flush the PICC. Do not have your blood pressure checked on the arm in which the PICC is placed. Do not ever pull or tug on the PICC. Keep it secured to your arm with tape or a stretch wrap when not in use. Do not take the PICC out yourself. Only a trained health care provider should remove the PICC. Keep pets and children away from your PICC. How to care for your PICC dressing Keep your PICC dressing clean and dry to prevent infection. Do not take baths, swim, or use a hot tub until your health care provider approves. Ask your health care provider if you can take  showers. You may only be allowed to take sponge baths. When you are allowed to shower: Ask your health care provider to teach you how to wrap the PICC. Cover the PICC with clear plastic wrap and tape to keep it dry while showering. Follow instructions from your health care provider about how to take care of your insertion site and dressing. Make sure you: Wash your hands with soap and water for at least 20 seconds before and after you change your dressing. If soap and water are not available, use hand sanitizer. Change your dressing only if taught to do so by your health care  provider. Your PICC dressing needs to be changed if it becomes loose or wet. Leave stitches (sutures), skin glue, or adhesive strips in place. These skin closures may need to stay in place for 2 weeks or longer. If adhesive strip edges start to loosen and curl up, you may trim the loose edges. Do not remove adhesive strips completely unless your health care provider tells you to do that. Follow these instructions at home: Disposal of supplies Throw away any syringes in a disposal container that is meant for sharp items (sharps container). You can buy a sharps container from a pharmacy, or you can make one by using an empty, hard plastic bottle with a lid. Place any used dressings or infusion bags into a plastic bag. Throw that bag in the trash. General instructions  Always carry your PICC identification card or wear a medical alert bracelet. Keep the tube clamped at all times, unless it is being used. Always carry a smooth-edge clamp with you to clamp the PICC if it breaks. Do not use scissors or sharp objects near the tube. You may bend your arm and move it freely. If your PICC is near or at the bend of your elbow, avoid activity with repeated motion at the elbow. Avoid lifting heavy objects as told by your health care provider. Keep all follow-up visits. This is important. You will need to have your PICC dressing changed at least once a week. Contact a health care provider if: You have pain in your arm, ear, face, or teeth. You have a fever or chills. You have redness, swelling, or pain around the insertion site. You have fluid or blood coming from the insertion site. Your insertion site feels warm to the touch. You have pus or a bad smell coming from the insertion site. Your skin feels hard and raised around the insertion site. Your PICC dressing has gotten wet or is coming off and you have not been taught how to change it. Get help right away if: You have problems with your PICC, such as  your PICC: Was tugged or pulled and has partially come out. Do not  push the PICC back in. Cannot be flushed, is hard to flush, or leaks around the insertion site when it is flushed. Makes a flushing sound when it is flushed. Appears to have a hole or tear. Is accidentally pulled all the way out. If this happens, cover the insertion site with a gauze dressing. Do not throw the PICC away. Your health care provider will need to check it to be sure the entire catheter came out. You feel your heart racing or skipping beats, or you have chest pain. You have shortness of breath or trouble breathing. You have swelling, redness, warmth, or pain in the arm in which the PICC is placed. You have a red streak going up your arm that  starts under the PICC dressing. These symptoms may be an emergency. Get help right away. Call 911. Do not wait to see if the symptoms will go away. Do not drive yourself to the hospital. Summary A peripherally inserted central catheter (PICC) is a long, thin, flexible tube (catheter) that is put into a vein in the arm or leg. If cared for properly, a PICC can remain in place for many months. Having a PICC can allow you to go home from the hospital sooner and continue treatment at home. The PICC is inserted using a germ-free (sterile) technique by a specially trained health care provider. Only a trained health care provider should remove it. Do not have your blood pressure checked on the arm in which your PICC is placed. Always keep your PICC identification card with you. This information is not intended to replace advice given to you by your health care provider. Make sure you discuss any questions you have with your health care provider. Document Revised: 07/01/2021 Document Reviewed: 07/01/2021 Elsevier Patient Education  2024 [employer].

## 2023-11-15 ENCOUNTER — Other Ambulatory Visit: Payer: Self-pay

## 2023-11-16 ENCOUNTER — Inpatient Hospital Stay: Payer: Medicare PPO

## 2023-11-16 VITALS — BP 127/66 | HR 74 | Temp 97.8°F | Resp 19

## 2023-11-16 DIAGNOSIS — Z170 Estrogen receptor positive status [ER+]: Secondary | ICD-10-CM | POA: Diagnosis not present

## 2023-11-16 DIAGNOSIS — Z452 Encounter for adjustment and management of vascular access device: Secondary | ICD-10-CM | POA: Diagnosis not present

## 2023-11-16 DIAGNOSIS — C50911 Malignant neoplasm of unspecified site of right female breast: Secondary | ICD-10-CM | POA: Diagnosis not present

## 2023-11-16 DIAGNOSIS — Z5111 Encounter for antineoplastic chemotherapy: Secondary | ICD-10-CM | POA: Diagnosis not present

## 2023-11-16 DIAGNOSIS — C771 Secondary and unspecified malignant neoplasm of intrathoracic lymph nodes: Secondary | ICD-10-CM | POA: Diagnosis not present

## 2023-11-16 DIAGNOSIS — C7951 Secondary malignant neoplasm of bone: Secondary | ICD-10-CM | POA: Diagnosis not present

## 2023-11-16 DIAGNOSIS — D709 Neutropenia, unspecified: Secondary | ICD-10-CM | POA: Diagnosis not present

## 2023-11-16 DIAGNOSIS — Z7982 Long term (current) use of aspirin: Secondary | ICD-10-CM | POA: Diagnosis not present

## 2023-11-16 DIAGNOSIS — Z79899 Other long term (current) drug therapy: Secondary | ICD-10-CM | POA: Diagnosis not present

## 2023-11-16 DIAGNOSIS — Z95828 Presence of other vascular implants and grafts: Secondary | ICD-10-CM

## 2023-11-16 MED ORDER — HEPARIN SOD (PORK) LOCK FLUSH 100 UNIT/ML IV SOLN
500.0000 [IU] | Freq: Once | INTRAVENOUS | Status: AC
  Administered 2023-11-16: 500 [IU] via INTRAVENOUS

## 2023-11-16 MED ORDER — SODIUM CHLORIDE 0.9% FLUSH
10.0000 mL | Freq: Once | INTRAVENOUS | Status: AC
  Administered 2023-11-16: 10 mL via INTRAVENOUS

## 2023-11-22 ENCOUNTER — Other Ambulatory Visit: Payer: Medicare PPO

## 2023-11-22 ENCOUNTER — Inpatient Hospital Stay: Payer: Medicare PPO

## 2023-11-22 ENCOUNTER — Encounter: Payer: Self-pay | Admitting: Medical Oncology

## 2023-11-22 ENCOUNTER — Inpatient Hospital Stay: Payer: Medicare PPO | Admitting: Medical Oncology

## 2023-11-22 ENCOUNTER — Ambulatory Visit: Payer: Medicare PPO | Admitting: Medical Oncology

## 2023-11-22 VITALS — BP 109/59 | HR 75 | Temp 98.0°F | Resp 19 | Ht 66.0 in | Wt 153.0 lb

## 2023-11-22 DIAGNOSIS — Z1502 Genetic susceptibility to malignant neoplasm of ovary: Secondary | ICD-10-CM

## 2023-11-22 DIAGNOSIS — C50919 Malignant neoplasm of unspecified site of unspecified female breast: Secondary | ICD-10-CM

## 2023-11-22 DIAGNOSIS — Z5111 Encounter for antineoplastic chemotherapy: Secondary | ICD-10-CM | POA: Diagnosis not present

## 2023-11-22 DIAGNOSIS — Z1589 Genetic susceptibility to other disease: Secondary | ICD-10-CM

## 2023-11-22 DIAGNOSIS — Z1509 Genetic susceptibility to other malignant neoplasm: Secondary | ICD-10-CM

## 2023-11-22 DIAGNOSIS — Z95828 Presence of other vascular implants and grafts: Secondary | ICD-10-CM

## 2023-11-22 DIAGNOSIS — C7951 Secondary malignant neoplasm of bone: Secondary | ICD-10-CM | POA: Diagnosis not present

## 2023-11-22 DIAGNOSIS — C771 Secondary and unspecified malignant neoplasm of intrathoracic lymph nodes: Secondary | ICD-10-CM | POA: Diagnosis not present

## 2023-11-22 DIAGNOSIS — Z1501 Genetic susceptibility to malignant neoplasm of breast: Secondary | ICD-10-CM

## 2023-11-22 DIAGNOSIS — Z452 Encounter for adjustment and management of vascular access device: Secondary | ICD-10-CM | POA: Diagnosis not present

## 2023-11-22 DIAGNOSIS — C50911 Malignant neoplasm of unspecified site of right female breast: Secondary | ICD-10-CM | POA: Diagnosis not present

## 2023-11-22 DIAGNOSIS — Z7982 Long term (current) use of aspirin: Secondary | ICD-10-CM | POA: Diagnosis not present

## 2023-11-22 DIAGNOSIS — Z170 Estrogen receptor positive status [ER+]: Secondary | ICD-10-CM | POA: Diagnosis not present

## 2023-11-22 DIAGNOSIS — Z79899 Other long term (current) drug therapy: Secondary | ICD-10-CM | POA: Diagnosis not present

## 2023-11-22 DIAGNOSIS — D709 Neutropenia, unspecified: Secondary | ICD-10-CM | POA: Diagnosis not present

## 2023-11-22 LAB — CBC WITH DIFFERENTIAL (CANCER CENTER ONLY)
Abs Immature Granulocytes: 0.01 10*3/uL (ref 0.00–0.07)
Basophils Absolute: 0 10*3/uL (ref 0.0–0.1)
Basophils Relative: 2 %
Eosinophils Absolute: 0 10*3/uL (ref 0.0–0.5)
Eosinophils Relative: 1 %
HCT: 29.3 % — ABNORMAL LOW (ref 36.0–46.0)
Hemoglobin: 10.2 g/dL — ABNORMAL LOW (ref 12.0–15.0)
Immature Granulocytes: 1 %
Lymphocytes Relative: 17 %
Lymphs Abs: 0.3 10*3/uL — ABNORMAL LOW (ref 0.7–4.0)
MCH: 37.1 pg — ABNORMAL HIGH (ref 26.0–34.0)
MCHC: 34.8 g/dL (ref 30.0–36.0)
MCV: 106.5 fL — ABNORMAL HIGH (ref 80.0–100.0)
Monocytes Absolute: 0.2 10*3/uL (ref 0.1–1.0)
Monocytes Relative: 9 %
Neutro Abs: 1.3 10*3/uL — ABNORMAL LOW (ref 1.7–7.7)
Neutrophils Relative %: 70 %
Platelet Count: 156 10*3/uL (ref 150–400)
RBC: 2.75 MIL/uL — ABNORMAL LOW (ref 3.87–5.11)
RDW: 13.6 % (ref 11.5–15.5)
WBC Count: 1.9 10*3/uL — ABNORMAL LOW (ref 4.0–10.5)
nRBC: 0 % (ref 0.0–0.2)

## 2023-11-22 LAB — CMP (CANCER CENTER ONLY)
ALT: 12 U/L (ref 0–44)
AST: 18 U/L (ref 15–41)
Albumin: 3.6 g/dL (ref 3.5–5.0)
Alkaline Phosphatase: 73 U/L (ref 38–126)
Anion gap: 5 (ref 5–15)
BUN: 18 mg/dL (ref 8–23)
CO2: 30 mmol/L (ref 22–32)
Calcium: 9.2 mg/dL (ref 8.9–10.3)
Chloride: 103 mmol/L (ref 98–111)
Creatinine: 1.38 mg/dL — ABNORMAL HIGH (ref 0.44–1.00)
GFR, Estimated: 41 mL/min — ABNORMAL LOW (ref >60)
Glucose, Bld: 100 mg/dL — ABNORMAL HIGH (ref 70–99)
Potassium: 4.2 mmol/L (ref 3.5–5.1)
Sodium: 138 mmol/L (ref 135–145)
Total Bilirubin: 0.4 mg/dL (ref <1.2)
Total Protein: 6.3 g/dL — ABNORMAL LOW (ref 6.5–8.1)

## 2023-11-22 LAB — LACTATE DEHYDROGENASE: LDH: 175 U/L (ref 98–192)

## 2023-11-22 MED ORDER — ZOLEDRONIC ACID 4 MG/100ML IV SOLN
4.0000 mg | Freq: Once | INTRAVENOUS | Status: AC
  Administered 2023-11-22: 4 mg via INTRAVENOUS
  Filled 2023-11-22: qty 100

## 2023-11-22 MED ORDER — SODIUM CHLORIDE 0.9% FLUSH
10.0000 mL | Freq: Once | INTRAVENOUS | Status: AC
  Administered 2023-11-22: 10 mL via INTRAVENOUS

## 2023-11-22 MED ORDER — FULVESTRANT 250 MG/5ML IM SOSY
500.0000 mg | PREFILLED_SYRINGE | Freq: Once | INTRAMUSCULAR | Status: AC
  Administered 2023-11-22: 500 mg via INTRAMUSCULAR
  Filled 2023-11-22: qty 10

## 2023-11-22 MED ORDER — HEPARIN SOD (PORK) LOCK FLUSH 100 UNIT/ML IV SOLN
500.0000 [IU] | Freq: Once | INTRAVENOUS | Status: AC
  Administered 2023-11-22: 500 [IU] via INTRAVENOUS

## 2023-11-22 MED ORDER — HEPARIN SOD (PORK) LOCK FLUSH 100 UNIT/ML IV SOLN
250.0000 [IU] | Freq: Once | INTRAVENOUS | Status: AC | PRN
  Administered 2023-11-22: 250 [IU]

## 2023-11-22 MED ORDER — SODIUM CHLORIDE 0.9% FLUSH
10.0000 mL | Freq: Once | INTRAVENOUS | Status: AC
  Administered 2023-11-22: 10 mL

## 2023-11-22 MED ORDER — SODIUM CHLORIDE 0.9 % IV SOLN: Freq: Once | INTRAVENOUS | Status: AC

## 2023-11-22 NOTE — Addendum Note (Signed)
Addended by: Clent Jacks on: 11/22/2023 10:59 AM   Modules accepted: Orders

## 2023-11-22 NOTE — Patient Instructions (Signed)

## 2023-11-22 NOTE — Progress Notes (Signed)
Patient prefers Zometa infusion over 15 minutes. She states she felt sickly after the first infusion of Zometa, However, she has had Zometa infusions over 15 minutes and feels fine. Pharmacy notified.

## 2023-11-22 NOTE — Progress Notes (Signed)
Hematology and Oncology Follow Up Visit  JMYA FLOYD [id number] [birth year] 72 y.o. 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 daily (21/7) --start on 11/09/2022 --changed to 400 mg p.o. daily started on 09/05/2023 Zometa 4 mg IV every 3 months -Due today- next dose Feb 2025 Radiation therapy to left hip -completed in 11/23/2022     Interim History:  Ms. Ruckel is back for follow-up and consideration of Zometa, Faslodex. She is still taking her Ribociclib daily without troubles.   She has had no problems with nausea or vomiting.  She has had no cough or shortness of breath.  She has had no change in bowel or bladder habits.    She has really done nicely with the lower dose of ribociclib. She does report that she feels her best on the week when she is off of her medication. She started her off week yesterday.   She has had no rashes.  There has been no leg swelling.  No dental concerns, recent dental work,  or upcoming dental procedures.   Overall, I would say that her performance status is probably ECOG 1.  Wt Readings from Last 3 Encounters:  11/22/23 153 lb (69.4 kg)  10/12/23 156 lb (70.8 kg)  09/30/23 158 lb 6.4 oz (71.8 kg)     Medications:  Current Outpatient Medications:    amLODipine (NORVASC) 2.5 MG tablet, Take 1 pill a day., Disp: 90 tablet, Rfl: 1   aspirin 81 MG tablet, Take 81 mg by mouth daily., Disp: , Rfl:    bisacodyl (DULCOLAX) 5 MG EC tablet, Take 5 mg by mouth daily as needed for moderate constipation., Disp: , Rfl:    bisoprolol-hydrochlorothiazide (ZIAC) 10-6.25 MG tablet, Take 1 tablet by mouth daily., Disp: 90 tablet, Rfl: 1   Cholecalciferol (VITAMIN D) 50 MCG (2000 UT) CAPS, Take 2,000 Units by mouth daily., Disp: , Rfl:    Heparin Na, Pork, Lock Flsh PF 100 UNIT/ML  SOLN, Inject 2.5 mLs (250 Units total) into the vein every 12 (twelve) hours., Disp: 300 mL, Rfl: 3   hydrochlorothiazide (HYDRODIURIL) 25 MG tablet, TAKE 1 TABLET(25 MG) BY MOUTH DAILY, Disp: 90 tablet, Rfl: 1   meclizine (ANTIVERT) 12.5 MG tablet, Take 1 tablet (12.5 mg total) by mouth 3 (three) times daily as needed for dizziness., Disp: 30 tablet, Rfl: 4   OLANZapine (ZYPREXA) 5 MG tablet, TAKE 1 TABLET(5 MG) BY MOUTH AT BEDTIME, Disp: 30 tablet, Rfl: 3   ondansetron (ZOFRAN) 8 MG tablet, TAKE 1 TABLET(8 MG) BY MOUTH EVERY 8 HOURS AS NEEDED FOR NAUSEA OR VOMITING, Disp: 30 tablet, Rfl: 2   potassium chloride SA (KLOR-CON M) 20 MEQ tablet, Take 1 tablet (20 mEq total) by mouth 2 (two) times daily., Disp: 60 tablet, Rfl: 2   ribociclib succ (KISQALI, 400 MG DOSE,) 200 MG Therapy Pack, Take 2 tablets (400 mg total) by mouth daily. Take for 21 days on, 7 days off, repeat every 28 days., Disp: 63 tablet, Rfl: 6   sodium chloride flush 0.9 % SOLN injection, Inject 10 mLs into the vein every 12 (twelve) hours., Disp: 600 mL, Rfl: 3   traMADol (ULTRAM) 50 MG tablet, Take 1 tablet (50 mg total) by mouth every 6 (six) hours as needed (mild pain)., Disp: 10 tablet, Rfl: 0   zoledronic acid (ZOMETA) 4 MG/5ML  injection, Inject 4 mg into the vein every 3 (three) months., Disp: , Rfl:   Allergies: No Known Allergies  Past Medical History, Surgical history, Social history, and Family History were reviewed and updated.  Review of Systems: Review of Systems  Constitutional:  Positive for fatigue.  HENT:  Negative.    Eyes: Negative.   Respiratory: Negative.    Cardiovascular: Negative.   Gastrointestinal:  Positive for constipation and nausea.  Endocrine: Negative.   Genitourinary: Negative.    Musculoskeletal:  Positive for arthralgias and myalgias.  Skin: Negative.   Neurological: Negative.   Hematological: Negative.   Psychiatric/Behavioral: Negative.      Physical Exam: Vital signs show  temperature of 98 4.4.  Pulse 100.  Blood pressure 156/92.  Weight is 167 pounds.  Wt Readings from Last 3 Encounters:  11/22/23 153 lb (69.4 kg)  10/12/23 156 lb (70.8 kg)  09/30/23 158 lb 6.4 oz (71.8 kg)    Physical Exam Vitals reviewed.  Constitutional:      Comments: Left breast shows no masses, edema or erythema.  There is no left axillary adenopathy.  Her right chest wall shows a well-healing mastectomy.  There is no erythema or warmth.  There is no right axillary adenopathy.  Marland Kitchen  HENT:     Head: Normocephalic and atraumatic.  Eyes:     Pupils: Pupils are equal, round, and reactive to light.  Cardiovascular:     Rate and Rhythm: Normal rate and regular rhythm.     Heart sounds: Normal heart sounds.  Pulmonary:     Effort: Pulmonary effort is normal.     Breath sounds: Normal breath sounds.  Abdominal:     General: Bowel sounds are normal.     Palpations: Abdomen is soft.  Musculoskeletal:        General: No tenderness or deformity. Normal range of motion.     Cervical back: Normal range of motion.  Lymphadenopathy:     Cervical: No cervical adenopathy.  Skin:    General: Skin is warm and dry.     Findings: No erythema or rash.  Neurological:     Mental Status: She is alert and oriented to person, place, and time.  Psychiatric:        Behavior: Behavior normal.        Thought Content: Thought content normal.        Judgment: Judgment normal.      Lab Results  Component Value Date   WBC 1.9 (L) 11/22/2023   HGB 10.2 (L) 11/22/2023   HCT 29.3 (L) 11/22/2023   MCV 106.5 (H) 11/22/2023   PLT 156 11/22/2023     Chemistry      Component Value Date/Time   NA 138 11/22/2023 0950   K 4.2 11/22/2023 0950   CL 103 11/22/2023 0950   CO2 30 11/22/2023 0950   BUN 18 11/22/2023 0950   CREATININE 1.38 (H) 11/22/2023 0950      Component Value Date/Time   CALCIUM 9.2 11/22/2023 0950   ALKPHOS 73 11/22/2023 0950   AST 18 11/22/2023 0950   ALT 12 11/22/2023 0950    BILITOT 0.4 11/22/2023 0950     Encounter Diagnoses  Name Primary?   Carcinoma of breast metastatic to bone, unspecified laterality (HCC) Yes   Status post PICC central line placement    Monoallelic mutation of CHEK2 gene in female patient      Impression and Plan: Ms. Collen is a very nice 72 year old white female.  She has metastatic breast cancer.  We have her on antiestrogen therapy. She is due for her monthly Faslodex today as well as Zometa. She is also on Ribociclib 400 mg po daily.   Labs reviewed today which show some mild neutropenia which is likely from her Ribociclib. She may require a dose adjustment in the near future given her neutropenia. The good thing is that her counts seem to recover nicely when she had her week off of treatment.   She is due for a PET scan in December. If disease is shown to be stable or improved this may be a good opportunity to lower her Ribociclib dose.   Disposition: PET in Dec PICC flush once weekly  Faslodex, Zometa today RTC 1 month Dr. Myna Hidalgo, labs, Faslodex  Rushie Chestnut, PA-C 11/26/202410:51 AM

## 2023-11-24 LAB — CANCER ANTIGEN 27.29: CA 27.29: 24.2 U/mL (ref 0.0–38.6)

## 2023-11-28 ENCOUNTER — Other Ambulatory Visit: Payer: Self-pay | Admitting: Hematology & Oncology

## 2023-11-28 ENCOUNTER — Other Ambulatory Visit (HOSPITAL_BASED_OUTPATIENT_CLINIC_OR_DEPARTMENT_OTHER): Payer: Self-pay

## 2023-11-28 MED ORDER — SODIUM CHLORIDE FLUSH 0.9 % IV SOLN
10.0000 mL | Freq: Two times a day (BID) | INTRAVENOUS | 3 refills | Status: DC
  Filled 2023-11-28: qty 600, 30d supply, fill #0
  Filled 2023-12-26: qty 600, 30d supply, fill #1
  Filled 2024-01-28: qty 600, 30d supply, fill #2
  Filled 2024-03-05: qty 600, 30d supply, fill #3

## 2023-11-28 MED ORDER — HEPARIN NA (PORK) LOCK FLSH PF 100 UNIT/ML IV SOLN
250.0000 [IU] | Freq: Two times a day (BID) | INTRAVENOUS | 3 refills | Status: DC
  Filled 2023-11-28: qty 300, 30d supply, fill #0
  Filled 2023-12-26: qty 300, 30d supply, fill #1
  Filled 2024-01-28: qty 300, 30d supply, fill #2
  Filled 2024-03-05: qty 300, 30d supply, fill #3

## 2023-11-29 ENCOUNTER — Other Ambulatory Visit (HOSPITAL_BASED_OUTPATIENT_CLINIC_OR_DEPARTMENT_OTHER): Payer: Self-pay

## 2023-11-30 ENCOUNTER — Inpatient Hospital Stay: Payer: Medicare PPO | Attending: Hematology & Oncology

## 2023-11-30 VITALS — BP 121/55 | HR 74 | Temp 97.7°F | Resp 20

## 2023-11-30 DIAGNOSIS — Z170 Estrogen receptor positive status [ER+]: Secondary | ICD-10-CM | POA: Diagnosis not present

## 2023-11-30 DIAGNOSIS — C7951 Secondary malignant neoplasm of bone: Secondary | ICD-10-CM | POA: Insufficient documentation

## 2023-11-30 DIAGNOSIS — C50919 Malignant neoplasm of unspecified site of unspecified female breast: Secondary | ICD-10-CM

## 2023-11-30 DIAGNOSIS — Z452 Encounter for adjustment and management of vascular access device: Secondary | ICD-10-CM | POA: Diagnosis not present

## 2023-11-30 DIAGNOSIS — C771 Secondary and unspecified malignant neoplasm of intrathoracic lymph nodes: Secondary | ICD-10-CM | POA: Diagnosis not present

## 2023-11-30 DIAGNOSIS — C50911 Malignant neoplasm of unspecified site of right female breast: Secondary | ICD-10-CM | POA: Insufficient documentation

## 2023-11-30 MED ORDER — SODIUM CHLORIDE 0.9% FLUSH
10.0000 mL | INTRAVENOUS | Status: DC | PRN
Start: 2023-11-30 — End: 2023-11-30
  Administered 2023-11-30: 10 mL via INTRAVENOUS

## 2023-11-30 MED ORDER — HEPARIN SOD (PORK) LOCK FLUSH 100 UNIT/ML IV SOLN
500.0000 [IU] | Freq: Once | INTRAVENOUS | Status: AC
  Administered 2023-11-30: 500 [IU] via INTRAVENOUS

## 2023-11-30 NOTE — Patient Instructions (Signed)
PICC Home Care Guide A peripherally inserted central catheter (PICC) is a form of IV access that allows medicines and IV fluids to be quickly put into the blood and spread throughout the body. The PICC is a long, thin, flexible tube (catheter) that is put into a vein in a person's arm or leg. The catheter ends in a large vein just outside the heart called the superior vena cava (SVC). After the PICC is put in, a chest X-ray may be done to make sure that it is in the right place. A PICC may be placed for different reasons, such as: To give medicines and liquid nutrition. To give IV fluids and blood products. To take blood samples often. If there is trouble placing a peripheral intravenous (PIV) catheter. If cared for properly, a PICC can remain in place for many months. Having a PICC can allow you to go home from the hospital sooner and continue treatment at home. Medicines and PICC care can be managed at home by a family member, caregiver, or home health care team. What are the risks? Generally, having a PICC is safe. However, problems may occur, including: A blood clot (thrombus) forming in or at the end of the PICC. A blood clot forming in a vein (deep vein thrombosis) or traveling to the lung (pulmonary embolism). Inflammation of the vein (phlebitis) in which the PICC is placed. Infection at the insertion site or in the blood. Blood infections from central lines, like PICCs, can be serious and often require a hospital stay. PICC malposition, or PICC movement or poor placement. A break or cut in the PICC. Do not use scissors near the PICC. Nerve or tendon irritation or injury during PICC insertion. How to care for your PICC Please follow the specific guidelines provided by your health care provider. Preventing infection You and any caregivers should wash your hands often with soap and water for at least 20 seconds. Wash hands: Before touching the PICC or the infusion device. Before changing a  bandage (dressing). Do not change the dressing unless you have been taught to do so and have shown you are able to change it safely. Flush the PICC as told. Tell your health care provider right away if the PICC is hard to flush or does not flush. Do not use force to flush the PICC. Use clean and germ-free (sterile) supplies only. Keep the supplies in a dry place. Do not reuse needles, syringes, or any other supplies. Reusing supplies can lead to infection. Keep the PICC dressing dry and secure it with tape if the edges stop sticking to your skin. Check your PICC insertion site every day for signs of infection. Check for: Redness, swelling, or pain. Fluid or blood. Warmth. Pus or a bad smell. Preventing other problems Do not use a syringe that is less than 10 mL to flush the PICC. Do not have your blood pressure checked on the arm in which the PICC is placed. Do not ever pull or tug on the PICC. Keep it secured to your arm with tape or a stretch wrap when not in use. Do not take the PICC out yourself. Only a trained health care provider should remove the PICC. Keep pets and children away from your PICC. How to care for your PICC dressing Keep your PICC dressing clean and dry to prevent infection. Do not take baths, swim, or use a hot tub until your health care provider approves. Ask your health care provider if you can take  showers. You may only be allowed to take sponge baths. When you are allowed to shower: Ask your health care provider to teach you how to wrap the PICC. Cover the PICC with clear plastic wrap and tape to keep it dry while showering. Follow instructions from your health care provider about how to take care of your insertion site and dressing. Make sure you: Wash your hands with soap and water for at least 20 seconds before and after you change your dressing. If soap and water are not available, use hand sanitizer. Change your dressing only if taught to do so by your health care  provider. Your PICC dressing needs to be changed if it becomes loose or wet. Leave stitches (sutures), skin glue, or adhesive strips in place. These skin closures may need to stay in place for 2 weeks or longer. If adhesive strip edges start to loosen and curl up, you may trim the loose edges. Do not remove adhesive strips completely unless your health care provider tells you to do that. Follow these instructions at home: Disposal of supplies Throw away any syringes in a disposal container that is meant for sharp items (sharps container). You can buy a sharps container from a pharmacy, or you can make one by using an empty, hard plastic bottle with a lid. Place any used dressings or infusion bags into a plastic bag. Throw that bag in the trash. General instructions  Always carry your PICC identification card or wear a medical alert bracelet. Keep the tube clamped at all times, unless it is being used. Always carry a smooth-edge clamp with you to clamp the PICC if it breaks. Do not use scissors or sharp objects near the tube. You may bend your arm and move it freely. If your PICC is near or at the bend of your elbow, avoid activity with repeated motion at the elbow. Avoid lifting heavy objects as told by your health care provider. Keep all follow-up visits. This is important. You will need to have your PICC dressing changed at least once a week. Contact a health care provider if: You have pain in your arm, ear, face, or teeth. You have a fever or chills. You have redness, swelling, or pain around the insertion site. You have fluid or blood coming from the insertion site. Your insertion site feels warm to the touch. You have pus or a bad smell coming from the insertion site. Your skin feels hard and raised around the insertion site. Your PICC dressing has gotten wet or is coming off and you have not been taught how to change it. Get help right away if: You have problems with your PICC, such as  your PICC: Was tugged or pulled and has partially come out. Do not  push the PICC back in. Cannot be flushed, is hard to flush, or leaks around the insertion site when it is flushed. Makes a flushing sound when it is flushed. Appears to have a hole or tear. Is accidentally pulled all the way out. If this happens, cover the insertion site with a gauze dressing. Do not throw the PICC away. Your health care provider will need to check it to be sure the entire catheter came out. You feel your heart racing or skipping beats, or you have chest pain. You have shortness of breath or trouble breathing. You have swelling, redness, warmth, or pain in the arm in which the PICC is placed. You have a red streak going up your arm that  starts under the PICC dressing. These symptoms may be an emergency. Get help right away. Call 911. Do not wait to see if the symptoms will go away. Do not drive yourself to the hospital. Summary A peripherally inserted central catheter (PICC) is a long, thin, flexible tube (catheter) that is put into a vein in the arm or leg. If cared for properly, a PICC can remain in place for many months. Having a PICC can allow you to go home from the hospital sooner and continue treatment at home. The PICC is inserted using a germ-free (sterile) technique by a specially trained health care provider. Only a trained health care provider should remove it. Do not have your blood pressure checked on the arm in which your PICC is placed. Always keep your PICC identification card with you. This information is not intended to replace advice given to you by your health care provider. Make sure you discuss any questions you have with your health care provider. Document Revised: 07/01/2021 Document Reviewed: 07/01/2021 Elsevier Patient Education  2024 [employer].

## 2023-12-07 ENCOUNTER — Inpatient Hospital Stay: Payer: Medicare PPO

## 2023-12-07 VITALS — BP 125/61 | HR 74 | Temp 97.8°F | Resp 18

## 2023-12-07 DIAGNOSIS — Z170 Estrogen receptor positive status [ER+]: Secondary | ICD-10-CM | POA: Diagnosis not present

## 2023-12-07 DIAGNOSIS — C7951 Secondary malignant neoplasm of bone: Secondary | ICD-10-CM | POA: Diagnosis not present

## 2023-12-07 DIAGNOSIS — C50911 Malignant neoplasm of unspecified site of right female breast: Secondary | ICD-10-CM | POA: Diagnosis not present

## 2023-12-07 DIAGNOSIS — C50919 Malignant neoplasm of unspecified site of unspecified female breast: Secondary | ICD-10-CM

## 2023-12-07 DIAGNOSIS — C771 Secondary and unspecified malignant neoplasm of intrathoracic lymph nodes: Secondary | ICD-10-CM | POA: Diagnosis not present

## 2023-12-07 DIAGNOSIS — Z452 Encounter for adjustment and management of vascular access device: Secondary | ICD-10-CM | POA: Diagnosis not present

## 2023-12-07 MED ORDER — SODIUM CHLORIDE 0.9% FLUSH
10.0000 mL | INTRAVENOUS | Status: DC | PRN
  Administered 2023-12-07: 10 mL via INTRAVENOUS

## 2023-12-07 MED ORDER — HEPARIN SOD (PORK) LOCK FLUSH 100 UNIT/ML IV SOLN
500.0000 [IU] | Freq: Once | INTRAVENOUS | Status: AC
  Administered 2023-12-07: 500 [IU] via INTRAVENOUS

## 2023-12-07 NOTE — Patient Instructions (Signed)
PICC Home Care Guide A peripherally inserted central catheter (PICC) is a form of IV access that allows medicines and IV fluids to be quickly put into the blood and spread throughout the body. The PICC is a long, thin, flexible tube (catheter) that is put into a vein in a person's arm or leg. The catheter ends in a large vein just outside the heart called the superior vena cava (SVC). After the PICC is put in, a chest X-ray may be done to make sure that it is in the right place. A PICC may be placed for different reasons, such as: To give medicines and liquid nutrition. To give IV fluids and blood products. To take blood samples often. If there is trouble placing a peripheral intravenous (PIV) catheter. If cared for properly, a PICC can remain in place for many months. Having a PICC can allow you to go home from the hospital sooner and continue treatment at home. Medicines and PICC care can be managed at home by a family member, caregiver, or home health care team. What are the risks? Generally, having a PICC is safe. However, problems may occur, including: A blood clot (thrombus) forming in or at the end of the PICC. A blood clot forming in a vein (deep vein thrombosis) or traveling to the lung (pulmonary embolism). Inflammation of the vein (phlebitis) in which the PICC is placed. Infection at the insertion site or in the blood. Blood infections from central lines, like PICCs, can be serious and often require a hospital stay. PICC malposition, or PICC movement or poor placement. A break or cut in the PICC. Do not use scissors near the PICC. Nerve or tendon irritation or injury during PICC insertion. How to care for your PICC Please follow the specific guidelines provided by your health care provider. Preventing infection You and any caregivers should wash your hands often with soap and water for at least 20 seconds. Wash hands: Before touching the PICC or the infusion device. Before changing a  bandage (dressing). Do not change the dressing unless you have been taught to do so and have shown you are able to change it safely. Flush the PICC as told. Tell your health care provider right away if the PICC is hard to flush or does not flush. Do not use force to flush the PICC. Use clean and germ-free (sterile) supplies only. Keep the supplies in a dry place. Do not reuse needles, syringes, or any other supplies. Reusing supplies can lead to infection. Keep the PICC dressing dry and secure it with tape if the edges stop sticking to your skin. Check your PICC insertion site every day for signs of infection. Check for: Redness, swelling, or pain. Fluid or blood. Warmth. Pus or a bad smell. Preventing other problems Do not use a syringe that is less than 10 mL to flush the PICC. Do not have your blood pressure checked on the arm in which the PICC is placed. Do not ever pull or tug on the PICC. Keep it secured to your arm with tape or a stretch wrap when not in use. Do not take the PICC out yourself. Only a trained health care provider should remove the PICC. Keep pets and children away from your PICC. How to care for your PICC dressing Keep your PICC dressing clean and dry to prevent infection. Do not take baths, swim, or use a hot tub until your health care provider approves. Ask your health care provider if you can take  showers. You may only be allowed to take sponge baths. When you are allowed to shower: Ask your health care provider to teach you how to wrap the PICC. Cover the PICC with clear plastic wrap and tape to keep it dry while showering. Follow instructions from your health care provider about how to take care of your insertion site and dressing. Make sure you: Wash your hands with soap and water for at least 20 seconds before and after you change your dressing. If soap and water are not available, use hand sanitizer. Change your dressing only if taught to do so by your health care  provider. Your PICC dressing needs to be changed if it becomes loose or wet. Leave stitches (sutures), skin glue, or adhesive strips in place. These skin closures may need to stay in place for 2 weeks or longer. If adhesive strip edges start to loosen and curl up, you may trim the loose edges. Do not remove adhesive strips completely unless your health care provider tells you to do that. Follow these instructions at home: Disposal of supplies Throw away any syringes in a disposal container that is meant for sharp items (sharps container). You can buy a sharps container from a pharmacy, or you can make one by using an empty, hard plastic bottle with a lid. Place any used dressings or infusion bags into a plastic bag. Throw that bag in the trash. General instructions  Always carry your PICC identification card or wear a medical alert bracelet. Keep the tube clamped at all times, unless it is being used. Always carry a smooth-edge clamp with you to clamp the PICC if it breaks. Do not use scissors or sharp objects near the tube. You may bend your arm and move it freely. If your PICC is near or at the bend of your elbow, avoid activity with repeated motion at the elbow. Avoid lifting heavy objects as told by your health care provider. Keep all follow-up visits. This is important. You will need to have your PICC dressing changed at least once a week. Contact a health care provider if: You have pain in your arm, ear, face, or teeth. You have a fever or chills. You have redness, swelling, or pain around the insertion site. You have fluid or blood coming from the insertion site. Your insertion site feels warm to the touch. You have pus or a bad smell coming from the insertion site. Your skin feels hard and raised around the insertion site. Your PICC dressing has gotten wet or is coming off and you have not been taught how to change it. Get help right away if: You have problems with your PICC, such as  your PICC: Was tugged or pulled and has partially come out. Do not  push the PICC back in. Cannot be flushed, is hard to flush, or leaks around the insertion site when it is flushed. Makes a flushing sound when it is flushed. Appears to have a hole or tear. Is accidentally pulled all the way out. If this happens, cover the insertion site with a gauze dressing. Do not throw the PICC away. Your health care provider will need to check it to be sure the entire catheter came out. You feel your heart racing or skipping beats, or you have chest pain. You have shortness of breath or trouble breathing. You have swelling, redness, warmth, or pain in the arm in which the PICC is placed. You have a red streak going up your arm that  starts under the PICC dressing. These symptoms may be an emergency. Get help right away. Call 911. Do not wait to see if the symptoms will go away. Do not drive yourself to the hospital. Summary A peripherally inserted central catheter (PICC) is a long, thin, flexible tube (catheter) that is put into a vein in the arm or leg. If cared for properly, a PICC can remain in place for many months. Having a PICC can allow you to go home from the hospital sooner and continue treatment at home. The PICC is inserted using a germ-free (sterile) technique by a specially trained health care provider. Only a trained health care provider should remove it. Do not have your blood pressure checked on the arm in which your PICC is placed. Always keep your PICC identification card with you. This information is not intended to replace advice given to you by your health care provider. Make sure you discuss any questions you have with your health care provider. Document Revised: 07/01/2021 Document Reviewed: 07/01/2021 Elsevier Patient Education  2024 [employer].

## 2023-12-12 ENCOUNTER — Other Ambulatory Visit: Payer: Self-pay

## 2023-12-12 ENCOUNTER — Other Ambulatory Visit (HOSPITAL_COMMUNITY): Payer: Self-pay

## 2023-12-12 NOTE — Progress Notes (Signed)
Specialty Pharmacy Refill Coordination Note  Tracy Thompson is a 72 y.o. female contacted today regarding refills of specialty medication(s) Ribociclib Succinate (Kisqali (400 MG Dose))   Patient requested Delivery   Delivery date: 12/20/23   Verified address: [address] [place] [id number]   Medication will be filled on 12/19/23.

## 2023-12-14 ENCOUNTER — Inpatient Hospital Stay: Payer: Medicare PPO

## 2023-12-19 ENCOUNTER — Encounter (HOSPITAL_COMMUNITY)
Admission: RE | Admit: 2023-12-19 | Discharge: 2023-12-19 | Disposition: A | Discharge status: Home / Self Care | Payer: Medicare PPO | Source: Ambulatory Visit | Attending: Medical Oncology | Admitting: Medical Oncology

## 2023-12-19 ENCOUNTER — Other Ambulatory Visit: Payer: Self-pay

## 2023-12-19 DIAGNOSIS — C50919 Malignant neoplasm of unspecified site of unspecified female breast: Secondary | ICD-10-CM | POA: Diagnosis not present

## 2023-12-19 DIAGNOSIS — C7951 Secondary malignant neoplasm of bone: Secondary | ICD-10-CM | POA: Diagnosis not present

## 2023-12-19 LAB — GLUCOSE, CAPILLARY: Glucose-Capillary: 88 mg/dL (ref 70–99)

## 2023-12-19 MED ORDER — FLUDEOXYGLUCOSE F - 18 (FDG) INJECTION
7.6000 | Freq: Once | INTRAVENOUS | Status: AC
  Administered 2023-12-19: 7.6 via INTRAVENOUS

## 2023-12-23 ENCOUNTER — Inpatient Hospital Stay: Payer: Medicare PPO

## 2023-12-23 VITALS — BP 121/76 | HR 80 | Temp 97.6°F | Resp 18

## 2023-12-23 DIAGNOSIS — Z452 Encounter for adjustment and management of vascular access device: Secondary | ICD-10-CM | POA: Diagnosis not present

## 2023-12-23 DIAGNOSIS — C7951 Secondary malignant neoplasm of bone: Secondary | ICD-10-CM | POA: Diagnosis not present

## 2023-12-23 DIAGNOSIS — C50911 Malignant neoplasm of unspecified site of right female breast: Secondary | ICD-10-CM | POA: Diagnosis not present

## 2023-12-23 DIAGNOSIS — Z170 Estrogen receptor positive status [ER+]: Secondary | ICD-10-CM | POA: Diagnosis not present

## 2023-12-23 DIAGNOSIS — C771 Secondary and unspecified malignant neoplasm of intrathoracic lymph nodes: Secondary | ICD-10-CM | POA: Diagnosis not present

## 2023-12-23 DIAGNOSIS — C50919 Malignant neoplasm of unspecified site of unspecified female breast: Secondary | ICD-10-CM

## 2023-12-23 MED ORDER — HEPARIN SOD (PORK) LOCK FLUSH 100 UNIT/ML IV SOLN
500.0000 [IU] | Freq: Once | INTRAVENOUS | Status: AC
  Administered 2023-12-23: 500 [IU] via INTRAVENOUS

## 2023-12-23 MED ORDER — SODIUM CHLORIDE 0.9% FLUSH
10.0000 mL | INTRAVENOUS | Status: DC | PRN
  Administered 2023-12-23: 10 mL via INTRAVENOUS

## 2023-12-23 NOTE — Patient Instructions (Signed)
PICC Home Care Guide A peripherally inserted central catheter (PICC) is a form of IV access that allows medicines and IV fluids to be quickly put into the blood and spread throughout the body. The PICC is a long, thin, flexible tube (catheter) that is put into a vein in a person's arm or leg. The catheter ends in a large vein just outside the heart called the superior vena cava (SVC). After the PICC is put in, a chest X-ray may be done to make sure that it is in the right place. A PICC may be placed for different reasons, such as: To give medicines and liquid nutrition. To give IV fluids and blood products. To take blood samples often. If there is trouble placing a peripheral intravenous (PIV) catheter. If cared for properly, a PICC can remain in place for many months. Having a PICC can allow you to go home from the hospital sooner and continue treatment at home. Medicines and PICC care can be managed at home by a family member, caregiver, or home health care team. What are the risks? Generally, having a PICC is safe. However, problems may occur, including: A blood clot (thrombus) forming in or at the end of the PICC. A blood clot forming in a vein (deep vein thrombosis) or traveling to the lung (pulmonary embolism). Inflammation of the vein (phlebitis) in which the PICC is placed. Infection at the insertion site or in the blood. Blood infections from central lines, like PICCs, can be serious and often require a hospital stay. PICC malposition, or PICC movement or poor placement. A break or cut in the PICC. Do not use scissors near the PICC. Nerve or tendon irritation or injury during PICC insertion. How to care for your PICC Please follow the specific guidelines provided by your health care provider. Preventing infection You and any caregivers should wash your hands often with soap and water for at least 20 seconds. Wash hands: Before touching the PICC or the infusion device. Before changing a  bandage (dressing). Do not change the dressing unless you have been taught to do so and have shown you are able to change it safely. Flush the PICC as told. Tell your health care provider right away if the PICC is hard to flush or does not flush. Do not use force to flush the PICC. Use clean and germ-free (sterile) supplies only. Keep the supplies in a dry place. Do not reuse needles, syringes, or any other supplies. Reusing supplies can lead to infection. Keep the PICC dressing dry and secure it with tape if the edges stop sticking to your skin. Check your PICC insertion site every day for signs of infection. Check for: Redness, swelling, or pain. Fluid or blood. Warmth. Pus or a bad smell. Preventing other problems Do not use a syringe that is less than 10 mL to flush the PICC. Do not have your blood pressure checked on the arm in which the PICC is placed. Do not ever pull or tug on the PICC. Keep it secured to your arm with tape or a stretch wrap when not in use. Do not take the PICC out yourself. Only a trained health care provider should remove the PICC. Keep pets and children away from your PICC. How to care for your PICC dressing Keep your PICC dressing clean and dry to prevent infection. Do not take baths, swim, or use a hot tub until your health care provider approves. Ask your health care provider if you can take  showers. You may only be allowed to take sponge baths. When you are allowed to shower: Ask your health care provider to teach you how to wrap the PICC. Cover the PICC with clear plastic wrap and tape to keep it dry while showering. Follow instructions from your health care provider about how to take care of your insertion site and dressing. Make sure you: Wash your hands with soap and water for at least 20 seconds before and after you change your dressing. If soap and water are not available, use hand sanitizer. Change your dressing only if taught to do so by your health care  provider. Your PICC dressing needs to be changed if it becomes loose or wet. Leave stitches (sutures), skin glue, or adhesive strips in place. These skin closures may need to stay in place for 2 weeks or longer. If adhesive strip edges start to loosen and curl up, you may trim the loose edges. Do not remove adhesive strips completely unless your health care provider tells you to do that. Follow these instructions at home: Disposal of supplies Throw away any syringes in a disposal container that is meant for sharp items (sharps container). You can buy a sharps container from a pharmacy, or you can make one by using an empty, hard plastic bottle with a lid. Place any used dressings or infusion bags into a plastic bag. Throw that bag in the trash. General instructions  Always carry your PICC identification card or wear a medical alert bracelet. Keep the tube clamped at all times, unless it is being used. Always carry a smooth-edge clamp with you to clamp the PICC if it breaks. Do not use scissors or sharp objects near the tube. You may bend your arm and move it freely. If your PICC is near or at the bend of your elbow, avoid activity with repeated motion at the elbow. Avoid lifting heavy objects as told by your health care provider. Keep all follow-up visits. This is important. You will need to have your PICC dressing changed at least once a week. Contact a health care provider if: You have pain in your arm, ear, face, or teeth. You have a fever or chills. You have redness, swelling, or pain around the insertion site. You have fluid or blood coming from the insertion site. Your insertion site feels warm to the touch. You have pus or a bad smell coming from the insertion site. Your skin feels hard and raised around the insertion site. Your PICC dressing has gotten wet or is coming off and you have not been taught how to change it. Get help right away if: You have problems with your PICC, such as  your PICC: Was tugged or pulled and has partially come out. Do not  push the PICC back in. Cannot be flushed, is hard to flush, or leaks around the insertion site when it is flushed. Makes a flushing sound when it is flushed. Appears to have a hole or tear. Is accidentally pulled all the way out. If this happens, cover the insertion site with a gauze dressing. Do not throw the PICC away. Your health care provider will need to check it to be sure the entire catheter came out. You feel your heart racing or skipping beats, or you have chest pain. You have shortness of breath or trouble breathing. You have swelling, redness, warmth, or pain in the arm in which the PICC is placed. You have a red streak going up your arm that  starts under the PICC dressing. These symptoms may be an emergency. Get help right away. Call 911. Do not wait to see if the symptoms will go away. Do not drive yourself to the hospital. Summary A peripherally inserted central catheter (PICC) is a long, thin, flexible tube (catheter) that is put into a vein in the arm or leg. If cared for properly, a PICC can remain in place for many months. Having a PICC can allow you to go home from the hospital sooner and continue treatment at home. The PICC is inserted using a germ-free (sterile) technique by a specially trained health care provider. Only a trained health care provider should remove it. Do not have your blood pressure checked on the arm in which your PICC is placed. Always keep your PICC identification card with you. This information is not intended to replace advice given to you by your health care provider. Make sure you discuss any questions you have with your health care provider. Document Revised: 07/01/2021 Document Reviewed: 07/01/2021 Elsevier Patient Education  2024 [employer].

## 2023-12-26 ENCOUNTER — Other Ambulatory Visit (HOSPITAL_BASED_OUTPATIENT_CLINIC_OR_DEPARTMENT_OTHER): Payer: Self-pay

## 2023-12-27 ENCOUNTER — Other Ambulatory Visit: Payer: Self-pay | Admitting: *Deleted

## 2023-12-27 DIAGNOSIS — C50919 Malignant neoplasm of unspecified site of unspecified female breast: Secondary | ICD-10-CM

## 2023-12-29 ENCOUNTER — Inpatient Hospital Stay: Payer: Medicare PPO | Admitting: Hematology & Oncology

## 2023-12-29 ENCOUNTER — Inpatient Hospital Stay: Payer: Medicare PPO | Attending: Hematology & Oncology

## 2023-12-29 ENCOUNTER — Encounter: Payer: Self-pay | Admitting: Hematology & Oncology

## 2023-12-29 ENCOUNTER — Inpatient Hospital Stay: Payer: Medicare PPO

## 2023-12-29 VITALS — BP 115/68 | HR 78 | Temp 97.5°F | Resp 19 | Ht 66.0 in | Wt 157.1 lb

## 2023-12-29 DIAGNOSIS — M7989 Other specified soft tissue disorders: Secondary | ICD-10-CM | POA: Diagnosis not present

## 2023-12-29 DIAGNOSIS — C7951 Secondary malignant neoplasm of bone: Secondary | ICD-10-CM | POA: Insufficient documentation

## 2023-12-29 DIAGNOSIS — Z5111 Encounter for antineoplastic chemotherapy: Secondary | ICD-10-CM | POA: Diagnosis not present

## 2023-12-29 DIAGNOSIS — Z170 Estrogen receptor positive status [ER+]: Secondary | ICD-10-CM | POA: Diagnosis not present

## 2023-12-29 DIAGNOSIS — R5383 Other fatigue: Secondary | ICD-10-CM | POA: Insufficient documentation

## 2023-12-29 DIAGNOSIS — C771 Secondary and unspecified malignant neoplasm of intrathoracic lymph nodes: Secondary | ICD-10-CM | POA: Diagnosis not present

## 2023-12-29 DIAGNOSIS — K5900 Constipation, unspecified: Secondary | ICD-10-CM | POA: Diagnosis not present

## 2023-12-29 DIAGNOSIS — C50911 Malignant neoplasm of unspecified site of right female breast: Secondary | ICD-10-CM | POA: Insufficient documentation

## 2023-12-29 DIAGNOSIS — R110 Nausea: Secondary | ICD-10-CM | POA: Diagnosis not present

## 2023-12-29 DIAGNOSIS — Z79899 Other long term (current) drug therapy: Secondary | ICD-10-CM | POA: Insufficient documentation

## 2023-12-29 DIAGNOSIS — C50919 Malignant neoplasm of unspecified site of unspecified female breast: Secondary | ICD-10-CM

## 2023-12-29 DIAGNOSIS — Z1721 Progesterone receptor positive status: Secondary | ICD-10-CM | POA: Diagnosis not present

## 2023-12-29 DIAGNOSIS — Z452 Encounter for adjustment and management of vascular access device: Secondary | ICD-10-CM | POA: Insufficient documentation

## 2023-12-29 DIAGNOSIS — M2550 Pain in unspecified joint: Secondary | ICD-10-CM | POA: Insufficient documentation

## 2023-12-29 DIAGNOSIS — M25552 Pain in left hip: Secondary | ICD-10-CM | POA: Diagnosis not present

## 2023-12-29 DIAGNOSIS — M791 Myalgia, unspecified site: Secondary | ICD-10-CM | POA: Diagnosis not present

## 2023-12-29 LAB — CBC WITH DIFFERENTIAL (CANCER CENTER ONLY)
Abs Immature Granulocytes: 0.01 10*3/uL (ref 0.00–0.07)
Basophils Absolute: 0 10*3/uL (ref 0.0–0.1)
Basophils Relative: 2 %
Eosinophils Absolute: 0 10*3/uL (ref 0.0–0.5)
Eosinophils Relative: 1 %
HCT: 28.6 % — ABNORMAL LOW (ref 36.0–46.0)
Hemoglobin: 10.1 g/dL — ABNORMAL LOW (ref 12.0–15.0)
Immature Granulocytes: 0 %
Lymphocytes Relative: 22 %
Lymphs Abs: 0.5 10*3/uL — ABNORMAL LOW (ref 0.7–4.0)
MCH: 38.1 pg — ABNORMAL HIGH (ref 26.0–34.0)
MCHC: 35.3 g/dL (ref 30.0–36.0)
MCV: 107.9 fL — ABNORMAL HIGH (ref 80.0–100.0)
Monocytes Absolute: 0.4 10*3/uL (ref 0.1–1.0)
Monocytes Relative: 17 %
Neutro Abs: 1.4 10*3/uL — ABNORMAL LOW (ref 1.7–7.7)
Neutrophils Relative %: 58 %
Platelet Count: 202 10*3/uL (ref 150–400)
RBC: 2.65 MIL/uL — ABNORMAL LOW (ref 3.87–5.11)
RDW: 14.9 % (ref 11.5–15.5)
WBC Count: 2.4 10*3/uL — ABNORMAL LOW (ref 4.0–10.5)
nRBC: 0 % (ref 0.0–0.2)

## 2023-12-29 LAB — CMP (CANCER CENTER ONLY)
ALT: 12 U/L (ref 0–44)
AST: 19 U/L (ref 15–41)
Albumin: 3.6 g/dL (ref 3.5–5.0)
Alkaline Phosphatase: 62 U/L (ref 38–126)
Anion gap: 6 (ref 5–15)
BUN: 18 mg/dL (ref 8–23)
CO2: 30 mmol/L (ref 22–32)
Calcium: 9 mg/dL (ref 8.9–10.3)
Chloride: 103 mmol/L (ref 98–111)
Creatinine: 1.45 mg/dL — ABNORMAL HIGH (ref 0.44–1.00)
GFR, Estimated: 39 mL/min — ABNORMAL LOW (ref >60)
Glucose, Bld: 92 mg/dL (ref 70–99)
Potassium: 4.3 mmol/L (ref 3.5–5.1)
Sodium: 139 mmol/L (ref 135–145)
Total Bilirubin: 0.3 mg/dL (ref 0.0–1.2)
Total Protein: 6.4 g/dL — ABNORMAL LOW (ref 6.5–8.1)

## 2023-12-29 MED ORDER — SODIUM CHLORIDE 0.9% FLUSH
10.0000 mL | INTRAVENOUS | Status: DC | PRN
  Administered 2023-12-29: 10 mL via INTRAVENOUS

## 2023-12-29 MED ORDER — HEPARIN SOD (PORK) LOCK FLUSH 100 UNIT/ML IV SOLN
500.0000 [IU] | Freq: Once | INTRAVENOUS | Status: AC
  Administered 2023-12-29: 500 [IU] via INTRAVENOUS

## 2023-12-29 MED ORDER — FULVESTRANT 250 MG/5ML IM SOSY
500.0000 mg | PREFILLED_SYRINGE | Freq: Once | INTRAMUSCULAR | Status: AC
  Administered 2023-12-29: 500 mg via INTRAMUSCULAR
  Filled 2023-12-29: qty 10

## 2023-12-29 NOTE — Progress Notes (Signed)
 Hematology and Oncology Follow Up Visit  Tracy Thompson [id number] [birth year] 73 y.o. 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 daily (21/7) --start on 11/09/2022 --changed to 400 mg p.o. daily started on 09/05/2023 Zometa  4 mg IV every 3 months -Due today- next dose Feb 2025 Radiation therapy to left hip -completed in 11/23/2022     Interim History:  Tracy Thompson is back for follow-up.  Everything looks fantastic.  Her last CA 27.29 down to 24.  She had a PET scan that was done on 12/19/2023.  Unfortunately, the results are not back yet.  She has done well with the ribociclib .  She has had no problems with nausea or vomiting.  She has had no diarrhea.  Patient still has libido pain over the left hip.  She has had radiotherapy to this area.  She has had no bleeding.  She has had no rashes.  She has chronic leg swelling.  Overall, I would say that her performance status is probably ECOG 1.   Wt Readings from Last 3 Encounters:  12/29/23 157 lb 1.9 oz (71.3 kg)  11/22/23 153 lb (69.4 kg)  10/12/23 156 lb (70.8 kg)     Medications:  Current Outpatient Medications:    amLODipine  (NORVASC ) 2.5 MG tablet, Take 1 pill a day., Disp: 90 tablet, Rfl: 1   aspirin 81 MG tablet, Take 81 mg by mouth daily., Disp: , Rfl:    bisacodyl (DULCOLAX) 5 MG EC tablet, Take 5 mg by mouth daily as needed for moderate constipation., Disp: , Rfl:    bisoprolol -hydrochlorothiazide  (ZIAC ) 10-6.25 MG tablet, Take 1 tablet by mouth daily., Disp: 90 tablet, Rfl: 1   Cholecalciferol (VITAMIN D ) 50 MCG (2000 UT) CAPS, Take 2,000 Units by mouth daily., Disp: , Rfl:    Heparin  Na, Pork, Lock Flsh PF 100 UNIT/ML SOLN, Inject 2.5 mLs (250 Units total) into the vein every 12 (twelve) hours., Disp: 300 mL, Rfl: 3   hydrochlorothiazide   (HYDRODIURIL ) 25 MG tablet, TAKE 1 TABLET(25 MG) BY MOUTH DAILY, Disp: 90 tablet, Rfl: 1   OLANZapine  (ZYPREXA ) 5 MG tablet, TAKE 1 TABLET(5 MG) BY MOUTH AT BEDTIME, Disp: 30 tablet, Rfl: 3   ondansetron  (ZOFRAN ) 8 MG tablet, TAKE 1 TABLET(8 MG) BY MOUTH EVERY 8 HOURS AS NEEDED FOR NAUSEA OR VOMITING, Disp: 30 tablet, Rfl: 2   potassium chloride  SA (KLOR-CON  M) 20 MEQ tablet, Take 1 tablet (20 mEq total) by mouth 2 (two) times daily., Disp: 60 tablet, Rfl: 2   ribociclib  succ (KISQALI , 400 MG DOSE,) 200 MG Therapy Pack, Take 2 tablets (400 mg total) by mouth daily. Take for 21 days on, 7 days off, repeat every 28 days., Disp: 63 tablet, Rfl: 6   sodium chloride  flush 0.9 % SOLN injection, Inject 10 mLs into the vein every 12 (twelve) hours., Disp: 600 mL, Rfl: 3   traMADol  (ULTRAM ) 50 MG tablet, Take 1 tablet (50 mg total) by mouth every 6 (six) hours as needed (mild pain)., Disp: 10 tablet, Rfl: 0   zoledronic  acid (ZOMETA ) 4 MG/5ML injection, Inject 4 mg into the vein every 3 (three) months., Disp: , Rfl:    meclizine  (ANTIVERT ) 12.5 MG tablet, Take 1 tablet (12.5 mg total) by mouth 3 (three) times daily as needed for dizziness. (Patient not taking: Reported on 12/29/2023), Disp: 30 tablet,  Rfl: 4 No current facility-administered medications for this visit.  Facility-Administered Medications Ordered in Other Visits:    sodium chloride  flush (NS) 0.9 % injection 10 mL, 10 mL, Intravenous, PRN, Daelon Dunivan R, MD, 10 mL at 12/29/23 1440  Allergies: No Known Allergies  Past Medical History, Surgical history, Social history, and Family History were reviewed and updated.  Review of Systems: Review of Systems  Constitutional:  Positive for fatigue.  HENT:  Negative.    Eyes: Negative.   Respiratory: Negative.    Cardiovascular: Negative.   Gastrointestinal:  Positive for constipation and nausea.  Endocrine: Negative.   Genitourinary: Negative.    Musculoskeletal:  Positive for arthralgias and  myalgias.  Skin: Negative.   Neurological: Negative.   Hematological: Negative.   Psychiatric/Behavioral: Negative.      Physical Exam: Vital signs show temperature of 97.5.  Pulse 78.  Blood pressure 115/68.  Weight is 157 pounds.    Wt Readings from Last 3 Encounters:  12/29/23 157 lb 1.9 oz (71.3 kg)  11/22/23 153 lb (69.4 kg)  10/12/23 156 lb (70.8 kg)    Physical Exam Vitals reviewed.  Constitutional:      Comments: Left breast shows no masses, edema or erythema.  There is no left axillary adenopathy.  Her right chest wall shows a well-healing mastectomy.  There is no erythema or warmth.  There is no right axillary adenopathy.  [name]  HENT:     Head: Normocephalic and atraumatic.  Eyes:     Pupils: Pupils are equal, round, and reactive to light.  Cardiovascular:     Rate and Rhythm: Normal rate and regular rhythm.     Heart sounds: Normal heart sounds.  Pulmonary:     Effort: Pulmonary effort is normal.     Breath sounds: Normal breath sounds.  Abdominal:     General: Bowel sounds are normal.     Palpations: Abdomen is soft.  Musculoskeletal:        General: No tenderness or deformity. Normal range of motion.     Cervical back: Normal range of motion.  Lymphadenopathy:     Cervical: No cervical adenopathy.  Skin:    General: Skin is warm and dry.     Findings: No erythema or rash.  Neurological:     Mental Status: She is alert and oriented to person, place, and time.  Psychiatric:        Behavior: Behavior normal.        Thought Content: Thought content normal.        Judgment: Judgment normal.      Lab Results  Component Value Date   WBC 2.4 (L) 12/29/2023   HGB 10.1 (L) 12/29/2023   HCT 28.6 (L) 12/29/2023   MCV 107.9 (H) 12/29/2023   PLT 202 12/29/2023     Chemistry      Component Value Date/Time   NA 139 12/29/2023 1445   K 4.3 12/29/2023 1445   CL 103 12/29/2023 1445   CO2 30 12/29/2023 1445   BUN 18 12/29/2023 1445   CREATININE 1.45 (H)  12/29/2023 1445      Component Value Date/Time   CALCIUM  9.0 12/29/2023 1445   ALKPHOS 62 12/29/2023 1445   AST 19 12/29/2023 1445   ALT 12 12/29/2023 1445   BILITOT 0.3 12/29/2023 1445        Impression and Plan: Tracy Thompson is a very nice 73 year old white female.  She has metastatic breast cancer.  We have her on antiestrogen  therapy.  She will receive Faslodex  today.  Again, I have to believe that the PET scan will show that she is responding.  The fact that the CA 27.29 is coming down is certainly encouraging.  I am is very happy that her quality of life is doing so well right now.  Will plan to get back in another month.     Maude JONELLE Crease, MD 1/2/20253:54 PM

## 2023-12-29 NOTE — Patient Instructions (Signed)
 PICC Home Care Guide A peripherally inserted central catheter (PICC) is a form of IV access that allows medicines and IV fluids to be quickly put into the blood and spread throughout the body. The PICC is a long, thin, flexible tube (catheter) that is put into a vein in a person's arm or leg. The catheter ends in a large vein just outside the heart called the superior vena cava (SVC). After the PICC is put in, a chest X-ray may be done to make sure that it is in the right place. A PICC may be placed for different reasons, such as: To give medicines and liquid nutrition. To give IV fluids and blood products. To take blood samples often. If there is trouble placing a peripheral intravenous (PIV) catheter. If cared for properly, a PICC can remain in place for many months. Having a PICC can allow you to go home from the hospital sooner and continue treatment at home. Medicines and PICC care can be managed at home by a family member, caregiver, or home health care team. What are the risks? Generally, having a PICC is safe. However, problems may occur, including: A blood clot (thrombus) forming in or at the end of the PICC. A blood clot forming in a vein (deep vein thrombosis) or traveling to the lung (pulmonary embolism). Inflammation of the vein (phlebitis) in which the PICC is placed. Infection at the insertion site or in the blood. Blood infections from central lines, like PICCs, can be serious and often require a hospital stay. PICC malposition, or PICC movement or poor placement. A break or cut in the PICC. Do not use scissors near the PICC. Nerve or tendon irritation or injury during PICC insertion. How to care for your PICC Please follow the specific guidelines provided by your health care provider. Preventing infection You and any caregivers should wash your hands often with soap and water for at least 20 seconds. Wash hands: Before touching the PICC or the infusion device. Before changing a  bandage (dressing). Do not change the dressing unless you have been taught to do so and have shown you are able to change it safely. Flush the PICC as told. Tell your health care provider right away if the PICC is hard to flush or does not flush. Do not use force to flush the PICC. Use clean and germ-free (sterile) supplies only. Keep the supplies in a dry place. Do not reuse needles, syringes, or any other supplies. Reusing supplies can lead to infection. Keep the PICC dressing dry and secure it with tape if the edges stop sticking to your skin. Check your PICC insertion site every day for signs of infection. Check for: Redness, swelling, or pain. Fluid or blood. Warmth. Pus or a bad smell. Preventing other problems Do not use a syringe that is less than 10 mL to flush the PICC. Do not have your blood pressure checked on the arm in which the PICC is placed. Do not ever pull or tug on the PICC. Keep it secured to your arm with tape or a stretch wrap when not in use. Do not take the PICC out yourself. Only a trained health care provider should remove the PICC. Keep pets and children away from your PICC. How to care for your PICC dressing Keep your PICC dressing clean and dry to prevent infection. Do not take baths, swim, or use a hot tub until your health care provider approves. Ask your health care provider if you can take  showers. You may only be allowed to take sponge baths. When you are allowed to shower: Ask your health care provider to teach you how to wrap the PICC. Cover the PICC with clear plastic wrap and tape to keep it dry while showering. Follow instructions from your health care provider about how to take care of your insertion site and dressing. Make sure you: Wash your hands with soap and water for at least 20 seconds before and after you change your dressing. If soap and water are not available, use hand sanitizer. Change your dressing only if taught to do so by your health care  provider. Your PICC dressing needs to be changed if it becomes loose or wet. Leave stitches (sutures), skin glue, or adhesive strips in place. These skin closures may need to stay in place for 2 weeks or longer. If adhesive strip edges start to loosen and curl up, you may trim the loose edges. Do not remove adhesive strips completely unless your health care provider tells you to do that. Follow these instructions at home: Disposal of supplies Throw away any syringes in a disposal container that is meant for sharp items (sharps container). You can buy a sharps container from a pharmacy, or you can make one by using an empty, hard plastic bottle with a lid. Place any used dressings or infusion bags into a plastic bag. Throw that bag in the trash. General instructions  Always carry your PICC identification card or wear a medical alert bracelet. Keep the tube clamped at all times, unless it is being used. Always carry a smooth-edge clamp with you to clamp the PICC if it breaks. Do not use scissors or sharp objects near the tube. You may bend your arm and move it freely. If your PICC is near or at the bend of your elbow, avoid activity with repeated motion at the elbow. Avoid lifting heavy objects as told by your health care provider. Keep all follow-up visits. This is important. You will need to have your PICC dressing changed at least once a week. Contact a health care provider if: You have pain in your arm, ear, face, or teeth. You have a fever or chills. You have redness, swelling, or pain around the insertion site. You have fluid or blood coming from the insertion site. Your insertion site feels warm to the touch. You have pus or a bad smell coming from the insertion site. Your skin feels hard and raised around the insertion site. Your PICC dressing has gotten wet or is coming off and you have not been taught how to change it. Get help right away if: You have problems with your PICC, such as  your PICC: Was tugged or pulled and has partially come out. Do not  push the PICC back in. Cannot be flushed, is hard to flush, or leaks around the insertion site when it is flushed. Makes a flushing sound when it is flushed. Appears to have a hole or tear. Is accidentally pulled all the way out. If this happens, cover the insertion site with a gauze dressing. Do not throw the PICC away. Your health care provider will need to check it to be sure the entire catheter came out. You feel your heart racing or skipping beats, or you have chest pain. You have shortness of breath or trouble breathing. You have swelling, redness, warmth, or pain in the arm in which the PICC is placed. You have a red streak going up your arm that  starts under the PICC dressing. These symptoms may be an emergency. Get help right away. Call 911. Do not wait to see if the symptoms will go away. Do not drive yourself to the hospital. Summary A peripherally inserted central catheter (PICC) is a long, thin, flexible tube (catheter) that is put into a vein in the arm or leg. If cared for properly, a PICC can remain in place for many months. Having a PICC can allow you to go home from the hospital sooner and continue treatment at home. The PICC is inserted using a germ-free (sterile) technique by a specially trained health care provider. Only a trained health care provider should remove it. Do not have your blood pressure checked on the arm in which your PICC is placed. Always keep your PICC identification card with you. This information is not intended to replace advice given to you by your health care provider. Make sure you discuss any questions you have with your health care provider. Document Revised: 07/01/2021 Document Reviewed: 07/01/2021 Elsevier Patient Education  2024 [employer].

## 2024-01-04 ENCOUNTER — Inpatient Hospital Stay: Payer: Medicare PPO

## 2024-01-04 ENCOUNTER — Encounter: Payer: Self-pay | Admitting: Hematology & Oncology

## 2024-01-04 VITALS — BP 103/87 | HR 74 | Temp 98.1°F | Resp 20

## 2024-01-04 DIAGNOSIS — Z5111 Encounter for antineoplastic chemotherapy: Secondary | ICD-10-CM | POA: Diagnosis not present

## 2024-01-04 DIAGNOSIS — Z452 Encounter for adjustment and management of vascular access device: Secondary | ICD-10-CM | POA: Diagnosis not present

## 2024-01-04 DIAGNOSIS — C50911 Malignant neoplasm of unspecified site of right female breast: Secondary | ICD-10-CM

## 2024-01-04 DIAGNOSIS — M25552 Pain in left hip: Secondary | ICD-10-CM | POA: Diagnosis not present

## 2024-01-04 DIAGNOSIS — Z170 Estrogen receptor positive status [ER+]: Secondary | ICD-10-CM | POA: Diagnosis not present

## 2024-01-04 DIAGNOSIS — C7951 Secondary malignant neoplasm of bone: Secondary | ICD-10-CM | POA: Diagnosis not present

## 2024-01-04 DIAGNOSIS — Z1721 Progesterone receptor positive status: Secondary | ICD-10-CM | POA: Diagnosis not present

## 2024-01-04 DIAGNOSIS — C771 Secondary and unspecified malignant neoplasm of intrathoracic lymph nodes: Secondary | ICD-10-CM | POA: Diagnosis not present

## 2024-01-04 DIAGNOSIS — M7989 Other specified soft tissue disorders: Secondary | ICD-10-CM | POA: Diagnosis not present

## 2024-01-04 MED ORDER — SODIUM CHLORIDE 0.9% FLUSH
10.0000 mL | INTRAVENOUS | Status: DC | PRN
  Administered 2024-01-04: 10 mL via INTRAVENOUS

## 2024-01-04 MED ORDER — HEPARIN SOD (PORK) LOCK FLUSH 100 UNIT/ML IV SOLN
500.0000 [IU] | Freq: Once | INTRAVENOUS | Status: AC
  Administered 2024-01-04: 500 [IU] via INTRAVENOUS

## 2024-01-04 NOTE — Patient Instructions (Signed)
 PICC Home Care Guide A peripherally inserted central catheter (PICC) is a form of IV access that allows medicines and IV fluids to be quickly put into the blood and spread throughout the body. The PICC is a long, thin, flexible tube (catheter) that is put into a vein in a person's arm or leg. The catheter ends in a large vein just outside the heart called the superior vena cava (SVC). After the PICC is put in, a chest X-ray may be done to make sure that it is in the right place. A PICC may be placed for different reasons, such as: To give medicines and liquid nutrition. To give IV fluids and blood products. To take blood samples often. If there is trouble placing a peripheral intravenous (PIV) catheter. If cared for properly, a PICC can remain in place for many months. Having a PICC can allow you to go home from the hospital sooner and continue treatment at home. Medicines and PICC care can be managed at home by a family member, caregiver, or home health care team. What are the risks? Generally, having a PICC is safe. However, problems may occur, including: A blood clot (thrombus) forming in or at the end of the PICC. A blood clot forming in a vein (deep vein thrombosis) or traveling to the lung (pulmonary embolism). Inflammation of the vein (phlebitis) in which the PICC is placed. Infection at the insertion site or in the blood. Blood infections from central lines, like PICCs, can be serious and often require a hospital stay. PICC malposition, or PICC movement or poor placement. A break or cut in the PICC. Do not use scissors near the PICC. Nerve or tendon irritation or injury during PICC insertion. How to care for your PICC Please follow the specific guidelines provided by your health care provider. Preventing infection You and any caregivers should wash your hands often with soap and water for at least 20 seconds. Wash hands: Before touching the PICC or the infusion device. Before changing a  bandage (dressing). Do not change the dressing unless you have been taught to do so and have shown you are able to change it safely. Flush the PICC as told. Tell your health care provider right away if the PICC is hard to flush or does not flush. Do not use force to flush the PICC. Use clean and germ-free (sterile) supplies only. Keep the supplies in a dry place. Do not reuse needles, syringes, or any other supplies. Reusing supplies can lead to infection. Keep the PICC dressing dry and secure it with tape if the edges stop sticking to your skin. Check your PICC insertion site every day for signs of infection. Check for: Redness, swelling, or pain. Fluid or blood. Warmth. Pus or a bad smell. Preventing other problems Do not use a syringe that is less than 10 mL to flush the PICC. Do not have your blood pressure checked on the arm in which the PICC is placed. Do not ever pull or tug on the PICC. Keep it secured to your arm with tape or a stretch wrap when not in use. Do not take the PICC out yourself. Only a trained health care provider should remove the PICC. Keep pets and children away from your PICC. How to care for your PICC dressing Keep your PICC dressing clean and dry to prevent infection. Do not take baths, swim, or use a hot tub until your health care provider approves. Ask your health care provider if you can take  showers. You may only be allowed to take sponge baths. When you are allowed to shower: Ask your health care provider to teach you how to wrap the PICC. Cover the PICC with clear plastic wrap and tape to keep it dry while showering. Follow instructions from your health care provider about how to take care of your insertion site and dressing. Make sure you: Wash your hands with soap and water for at least 20 seconds before and after you change your dressing. If soap and water are not available, use hand sanitizer. Change your dressing only if taught to do so by your health care  provider. Your PICC dressing needs to be changed if it becomes loose or wet. Leave stitches (sutures), skin glue, or adhesive strips in place. These skin closures may need to stay in place for 2 weeks or longer. If adhesive strip edges start to loosen and curl up, you may trim the loose edges. Do not remove adhesive strips completely unless your health care provider tells you to do that. Follow these instructions at home: Disposal of supplies Throw away any syringes in a disposal container that is meant for sharp items (sharps container). You can buy a sharps container from a pharmacy, or you can make one by using an empty, hard plastic bottle with a lid. Place any used dressings or infusion bags into a plastic bag. Throw that bag in the trash. General instructions  Always carry your PICC identification card or wear a medical alert bracelet. Keep the tube clamped at all times, unless it is being used. Always carry a smooth-edge clamp with you to clamp the PICC if it breaks. Do not use scissors or sharp objects near the tube. You may bend your arm and move it freely. If your PICC is near or at the bend of your elbow, avoid activity with repeated motion at the elbow. Avoid lifting heavy objects as told by your health care provider. Keep all follow-up visits. This is important. You will need to have your PICC dressing changed at least once a week. Contact a health care provider if: You have pain in your arm, ear, face, or teeth. You have a fever or chills. You have redness, swelling, or pain around the insertion site. You have fluid or blood coming from the insertion site. Your insertion site feels warm to the touch. You have pus or a bad smell coming from the insertion site. Your skin feels hard and raised around the insertion site. Your PICC dressing has gotten wet or is coming off and you have not been taught how to change it. Get help right away if: You have problems with your PICC, such as  your PICC: Was tugged or pulled and has partially come out. Do not  push the PICC back in. Cannot be flushed, is hard to flush, or leaks around the insertion site when it is flushed. Makes a flushing sound when it is flushed. Appears to have a hole or tear. Is accidentally pulled all the way out. If this happens, cover the insertion site with a gauze dressing. Do not throw the PICC away. Your health care provider will need to check it to be sure the entire catheter came out. You feel your heart racing or skipping beats, or you have chest pain. You have shortness of breath or trouble breathing. You have swelling, redness, warmth, or pain in the arm in which the PICC is placed. You have a red streak going up your arm that  starts under the PICC dressing. These symptoms may be an emergency. Get help right away. Call 911. Do not wait to see if the symptoms will go away. Do not drive yourself to the hospital. Summary A peripherally inserted central catheter (PICC) is a long, thin, flexible tube (catheter) that is put into a vein in the arm or leg. If cared for properly, a PICC can remain in place for many months. Having a PICC can allow you to go home from the hospital sooner and continue treatment at home. The PICC is inserted using a germ-free (sterile) technique by a specially trained health care provider. Only a trained health care provider should remove it. Do not have your blood pressure checked on the arm in which your PICC is placed. Always keep your PICC identification card with you. This information is not intended to replace advice given to you by your health care provider. Make sure you discuss any questions you have with your health care provider. Document Revised: 07/01/2021 Document Reviewed: 07/01/2021 Elsevier Patient Education  2024 [employer].

## 2024-01-11 ENCOUNTER — Other Ambulatory Visit: Payer: Self-pay

## 2024-01-11 ENCOUNTER — Inpatient Hospital Stay: Payer: Medicare PPO

## 2024-01-11 ENCOUNTER — Other Ambulatory Visit (HOSPITAL_COMMUNITY): Payer: Self-pay

## 2024-01-11 VITALS — BP 122/59 | HR 75 | Temp 97.9°F | Resp 16

## 2024-01-11 DIAGNOSIS — C50911 Malignant neoplasm of unspecified site of right female breast: Secondary | ICD-10-CM | POA: Diagnosis not present

## 2024-01-11 DIAGNOSIS — C771 Secondary and unspecified malignant neoplasm of intrathoracic lymph nodes: Secondary | ICD-10-CM | POA: Diagnosis not present

## 2024-01-11 DIAGNOSIS — Z5111 Encounter for antineoplastic chemotherapy: Secondary | ICD-10-CM | POA: Diagnosis not present

## 2024-01-11 DIAGNOSIS — M25552 Pain in left hip: Secondary | ICD-10-CM | POA: Diagnosis not present

## 2024-01-11 DIAGNOSIS — M7989 Other specified soft tissue disorders: Secondary | ICD-10-CM | POA: Diagnosis not present

## 2024-01-11 DIAGNOSIS — Z1721 Progesterone receptor positive status: Secondary | ICD-10-CM | POA: Diagnosis not present

## 2024-01-11 DIAGNOSIS — Z452 Encounter for adjustment and management of vascular access device: Secondary | ICD-10-CM | POA: Diagnosis not present

## 2024-01-11 DIAGNOSIS — C7951 Secondary malignant neoplasm of bone: Secondary | ICD-10-CM | POA: Diagnosis not present

## 2024-01-11 DIAGNOSIS — Z170 Estrogen receptor positive status [ER+]: Secondary | ICD-10-CM | POA: Diagnosis not present

## 2024-01-11 MED ORDER — SODIUM CHLORIDE 0.9% FLUSH
10.0000 mL | INTRAVENOUS | Status: DC | PRN
  Administered 2024-01-11: 10 mL via INTRAVENOUS

## 2024-01-11 MED ORDER — HEPARIN SOD (PORK) LOCK FLUSH 100 UNIT/ML IV SOLN
500.0000 [IU] | Freq: Once | INTRAVENOUS | Status: AC
Start: 2024-01-11 — End: 2024-01-11
  Administered 2024-01-11: 500 [IU] via INTRAVENOUS

## 2024-01-11 NOTE — Patient Instructions (Signed)
 PICC Home Care Guide A peripherally inserted central catheter (PICC) is a form of IV access that allows medicines and IV fluids to be quickly put into the blood and spread throughout the body. The PICC is a long, thin, flexible tube (catheter) that is put into a vein in a person's arm or leg. The catheter ends in a large vein just outside the heart called the superior vena cava (SVC). After the PICC is put in, a chest X-ray may be done to make sure that it is in the right place. A PICC may be placed for different reasons, such as: To give medicines and liquid nutrition. To give IV fluids and blood products. To take blood samples often. If there is trouble placing a peripheral intravenous (PIV) catheter. If cared for properly, a PICC can remain in place for many months. Having a PICC can allow you to go home from the hospital sooner and continue treatment at home. Medicines and PICC care can be managed at home by a family member, caregiver, or home health care team. What are the risks? Generally, having a PICC is safe. However, problems may occur, including: A blood clot (thrombus) forming in or at the end of the PICC. A blood clot forming in a vein (deep vein thrombosis) or traveling to the lung (pulmonary embolism). Inflammation of the vein (phlebitis) in which the PICC is placed. Infection at the insertion site or in the blood. Blood infections from central lines, like PICCs, can be serious and often require a hospital stay. PICC malposition, or PICC movement or poor placement. A break or cut in the PICC. Do not use scissors near the PICC. Nerve or tendon irritation or injury during PICC insertion. How to care for your PICC Please follow the specific guidelines provided by your health care provider. Preventing infection You and any caregivers should wash your hands often with soap and water for at least 20 seconds. Wash hands: Before touching the PICC or the infusion device. Before changing a  bandage (dressing). Do not change the dressing unless you have been taught to do so and have shown you are able to change it safely. Flush the PICC as told. Tell your health care provider right away if the PICC is hard to flush or does not flush. Do not use force to flush the PICC. Use clean and germ-free (sterile) supplies only. Keep the supplies in a dry place. Do not reuse needles, syringes, or any other supplies. Reusing supplies can lead to infection. Keep the PICC dressing dry and secure it with tape if the edges stop sticking to your skin. Check your PICC insertion site every day for signs of infection. Check for: Redness, swelling, or pain. Fluid or blood. Warmth. Pus or a bad smell. Preventing other problems Do not use a syringe that is less than 10 mL to flush the PICC. Do not have your blood pressure checked on the arm in which the PICC is placed. Do not ever pull or tug on the PICC. Keep it secured to your arm with tape or a stretch wrap when not in use. Do not take the PICC out yourself. Only a trained health care provider should remove the PICC. Keep pets and children away from your PICC. How to care for your PICC dressing Keep your PICC dressing clean and dry to prevent infection. Do not take baths, swim, or use a hot tub until your health care provider approves. Ask your health care provider if you can take  showers. You may only be allowed to take sponge baths. When you are allowed to shower: Ask your health care provider to teach you how to wrap the PICC. Cover the PICC with clear plastic wrap and tape to keep it dry while showering. Follow instructions from your health care provider about how to take care of your insertion site and dressing. Make sure you: Wash your hands with soap and water for at least 20 seconds before and after you change your dressing. If soap and water are not available, use hand sanitizer. Change your dressing only if taught to do so by your health care  provider. Your PICC dressing needs to be changed if it becomes loose or wet. Leave stitches (sutures), skin glue, or adhesive strips in place. These skin closures may need to stay in place for 2 weeks or longer. If adhesive strip edges start to loosen and curl up, you may trim the loose edges. Do not remove adhesive strips completely unless your health care provider tells you to do that. Follow these instructions at home: Disposal of supplies Throw away any syringes in a disposal container that is meant for sharp items (sharps container). You can buy a sharps container from a pharmacy, or you can make one by using an empty, hard plastic bottle with a lid. Place any used dressings or infusion bags into a plastic bag. Throw that bag in the trash. General instructions  Always carry your PICC identification card or wear a medical alert bracelet. Keep the tube clamped at all times, unless it is being used. Always carry a smooth-edge clamp with you to clamp the PICC if it breaks. Do not use scissors or sharp objects near the tube. You may bend your arm and move it freely. If your PICC is near or at the bend of your elbow, avoid activity with repeated motion at the elbow. Avoid lifting heavy objects as told by your health care provider. Keep all follow-up visits. This is important. You will need to have your PICC dressing changed at least once a week. Contact a health care provider if: You have pain in your arm, ear, face, or teeth. You have a fever or chills. You have redness, swelling, or pain around the insertion site. You have fluid or blood coming from the insertion site. Your insertion site feels warm to the touch. You have pus or a bad smell coming from the insertion site. Your skin feels hard and raised around the insertion site. Your PICC dressing has gotten wet or is coming off and you have not been taught how to change it. Get help right away if: You have problems with your PICC, such as  your PICC: Was tugged or pulled and has partially come out. Do not  push the PICC back in. Cannot be flushed, is hard to flush, or leaks around the insertion site when it is flushed. Makes a flushing sound when it is flushed. Appears to have a hole or tear. Is accidentally pulled all the way out. If this happens, cover the insertion site with a gauze dressing. Do not throw the PICC away. Your health care provider will need to check it to be sure the entire catheter came out. You feel your heart racing or skipping beats, or you have chest pain. You have shortness of breath or trouble breathing. You have swelling, redness, warmth, or pain in the arm in which the PICC is placed. You have a red streak going up your arm that  starts under the PICC dressing. These symptoms may be an emergency. Get help right away. Call 911. Do not wait to see if the symptoms will go away. Do not drive yourself to the hospital. Summary A peripherally inserted central catheter (PICC) is a long, thin, flexible tube (catheter) that is put into a vein in the arm or leg. If cared for properly, a PICC can remain in place for many months. Having a PICC can allow you to go home from the hospital sooner and continue treatment at home. The PICC is inserted using a germ-free (sterile) technique by a specially trained health care provider. Only a trained health care provider should remove it. Do not have your blood pressure checked on the arm in which your PICC is placed. Always keep your PICC identification card with you. This information is not intended to replace advice given to you by your health care provider. Make sure you discuss any questions you have with your health care provider. Document Revised: 07/01/2021 Document Reviewed: 07/01/2021 Elsevier Patient Education  2024 [employer].

## 2024-01-11 NOTE — Progress Notes (Signed)
 Specialty Pharmacy Refill Coordination Note  Tracy Thompson is a 73 y.o. female contacted today regarding refills of specialty medication(s) Ribociclib  Succinate (Kisqali  (400 MG Dose))   Patient requested Delivery   Delivery date: 01/17/24   Verified address: [address] [place] [address]   Medication will be filled on 01/16/24.   Patient ok'd $100 copay to visa on file.   Per Sandi product will be short dated - w/i 6 months - [name] ok'd us  to fill and patient is aware.

## 2024-01-16 ENCOUNTER — Other Ambulatory Visit: Payer: Self-pay

## 2024-01-17 ENCOUNTER — Other Ambulatory Visit (HOSPITAL_COMMUNITY): Payer: Self-pay

## 2024-01-18 ENCOUNTER — Inpatient Hospital Stay: Payer: Medicare PPO

## 2024-01-18 VITALS — BP 127/69 | HR 87 | Temp 97.6°F | Resp 18

## 2024-01-18 DIAGNOSIS — Z170 Estrogen receptor positive status [ER+]: Secondary | ICD-10-CM | POA: Diagnosis not present

## 2024-01-18 DIAGNOSIS — C50911 Malignant neoplasm of unspecified site of right female breast: Secondary | ICD-10-CM

## 2024-01-18 DIAGNOSIS — C771 Secondary and unspecified malignant neoplasm of intrathoracic lymph nodes: Secondary | ICD-10-CM | POA: Diagnosis not present

## 2024-01-18 DIAGNOSIS — Z452 Encounter for adjustment and management of vascular access device: Secondary | ICD-10-CM | POA: Diagnosis not present

## 2024-01-18 DIAGNOSIS — M25552 Pain in left hip: Secondary | ICD-10-CM | POA: Diagnosis not present

## 2024-01-18 DIAGNOSIS — Z1721 Progesterone receptor positive status: Secondary | ICD-10-CM | POA: Diagnosis not present

## 2024-01-18 DIAGNOSIS — Z5111 Encounter for antineoplastic chemotherapy: Secondary | ICD-10-CM | POA: Diagnosis not present

## 2024-01-18 DIAGNOSIS — C7951 Secondary malignant neoplasm of bone: Secondary | ICD-10-CM | POA: Diagnosis not present

## 2024-01-18 DIAGNOSIS — M7989 Other specified soft tissue disorders: Secondary | ICD-10-CM | POA: Diagnosis not present

## 2024-01-18 MED ORDER — HEPARIN SOD (PORK) LOCK FLUSH 100 UNIT/ML IV SOLN
500.0000 [IU] | Freq: Once | INTRAVENOUS | Status: AC
  Administered 2024-01-18: 500 [IU] via INTRAVENOUS

## 2024-01-18 MED ORDER — SODIUM CHLORIDE 0.9% FLUSH
10.0000 mL | INTRAVENOUS | Status: DC | PRN
  Administered 2024-01-18: 10 mL via INTRAVENOUS

## 2024-01-18 NOTE — Patient Instructions (Signed)
PICC Home Care Guide A peripherally inserted central catheter (PICC) is a form of IV access that allows medicines and IV fluids to be quickly put into the blood and spread throughout the body. The PICC is a long, thin, flexible tube (catheter) that is put into a vein in a person's arm or leg. The catheter ends in a large vein just outside the heart called the superior vena cava (SVC). After the PICC is put in, a chest X-ray may be done to make sure that it is in the right place. A PICC may be placed for different reasons, such as: To give medicines and liquid nutrition. To give IV fluids and blood products. To take blood samples often. If there is trouble placing a peripheral intravenous (PIV) catheter. If cared for properly, a PICC can remain in place for many months. Having a PICC can allow you to go home from the hospital sooner and continue treatment at home. Medicines and PICC care can be managed at home by a family member, caregiver, or home health care team. What are the risks? Generally, having a PICC is safe. However, problems may occur, including: A blood clot (thrombus) forming in or at the end of the PICC. A blood clot forming in a vein (deep vein thrombosis) or traveling to the lung (pulmonary embolism). Inflammation of the vein (phlebitis) in which the PICC is placed. Infection at the insertion site or in the blood. Blood infections from central lines, like PICCs, can be serious and often require a hospital stay. PICC malposition, or PICC movement or poor placement. A break or cut in the PICC. Do not use scissors near the PICC. Nerve or tendon irritation or injury during PICC insertion. How to care for your PICC Please follow the specific guidelines provided by your health care provider. Preventing infection You and any caregivers should wash your hands often with soap and water for at least 20 seconds. Wash hands: Before touching the PICC or the infusion device. Before changing a  bandage (dressing). Do not change the dressing unless you have been taught to do so and have shown you are able to change it safely. Flush the PICC as told. Tell your health care provider right away if the PICC is hard to flush or does not flush. Do not use force to flush the PICC. Use clean and germ-free (sterile) supplies only. Keep the supplies in a dry place. Do not reuse needles, syringes, or any other supplies. Reusing supplies can lead to infection. Keep the PICC dressing dry and secure it with tape if the edges stop sticking to your skin. Check your PICC insertion site every day for signs of infection. Check for: Redness, swelling, or pain. Fluid or blood. Warmth. Pus or a bad smell. Preventing other problems Do not use a syringe that is less than 10 mL to flush the PICC. Do not have your blood pressure checked on the arm in which the PICC is placed. Do not ever pull or tug on the PICC. Keep it secured to your arm with tape or a stretch wrap when not in use. Do not take the PICC out yourself. Only a trained health care provider should remove the PICC. Keep pets and children away from your PICC. How to care for your PICC dressing Keep your PICC dressing clean and dry to prevent infection. Do not take baths, swim, or use a hot tub until your health care provider approves. Ask your health care provider if you can take  showers. You may only be allowed to take sponge baths. When you are allowed to shower: Ask your health care provider to teach you how to wrap the PICC. Cover the PICC with clear plastic wrap and tape to keep it dry while showering. Follow instructions from your health care provider about how to take care of your insertion site and dressing. Make sure you: Wash your hands with soap and water for at least 20 seconds before and after you change your dressing. If soap and water are not available, use hand sanitizer. Change your dressing only if taught to do so by your health care  provider. Your PICC dressing needs to be changed if it becomes loose or wet. Leave stitches (sutures), skin glue, or adhesive strips in place. These skin closures may need to stay in place for 2 weeks or longer. If adhesive strip edges start to loosen and curl up, you may trim the loose edges. Do not remove adhesive strips completely unless your health care provider tells you to do that. Follow these instructions at home: Disposal of supplies Throw away any syringes in a disposal container that is meant for sharp items (sharps container). You can buy a sharps container from a pharmacy, or you can make one by using an empty, hard plastic bottle with a lid. Place any used dressings or infusion bags into a plastic bag. Throw that bag in the trash. General instructions  Always carry your PICC identification card or wear a medical alert bracelet. Keep the tube clamped at all times, unless it is being used. Always carry a smooth-edge clamp with you to clamp the PICC if it breaks. Do not use scissors or sharp objects near the tube. You may bend your arm and move it freely. If your PICC is near or at the bend of your elbow, avoid activity with repeated motion at the elbow. Avoid lifting heavy objects as told by your health care provider. Keep all follow-up visits. This is important. You will need to have your PICC dressing changed at least once a week. Contact a health care provider if: You have pain in your arm, ear, face, or teeth. You have a fever or chills. You have redness, swelling, or pain around the insertion site. You have fluid or blood coming from the insertion site. Your insertion site feels warm to the touch. You have pus or a bad smell coming from the insertion site. Your skin feels hard and raised around the insertion site. Your PICC dressing has gotten wet or is coming off and you have not been taught how to change it. Get help right away if: You have problems with your PICC, such as  your PICC: Was tugged or pulled and has partially come out. Do not  push the PICC back in. Cannot be flushed, is hard to flush, or leaks around the insertion site when it is flushed. Makes a flushing sound when it is flushed. Appears to have a hole or tear. Is accidentally pulled all the way out. If this happens, cover the insertion site with a gauze dressing. Do not throw the PICC away. Your health care provider will need to check it to be sure the entire catheter came out. You feel your heart racing or skipping beats, or you have chest pain. You have shortness of breath or trouble breathing. You have swelling, redness, warmth, or pain in the arm in which the PICC is placed. You have a red streak going up your arm that  starts under the PICC dressing. These symptoms may be an emergency. Get help right away. Call 911. Do not wait to see if the symptoms will go away. Do not drive yourself to the hospital. Summary A peripherally inserted central catheter (PICC) is a long, thin, flexible tube (catheter) that is put into a vein in the arm or leg. If cared for properly, a PICC can remain in place for many months. Having a PICC can allow you to go home from the hospital sooner and continue treatment at home. The PICC is inserted using a germ-free (sterile) technique by a specially trained health care provider. Only a trained health care provider should remove it. Do not have your blood pressure checked on the arm in which your PICC is placed. Always keep your PICC identification card with you. This information is not intended to replace advice given to you by your health care provider. Make sure you discuss any questions you have with your health care provider. Document Revised: 07/01/2021 Document Reviewed: 07/01/2021 Elsevier Patient Education  2024 [employer].

## 2024-01-25 ENCOUNTER — Inpatient Hospital Stay: Payer: Medicare PPO

## 2024-01-25 ENCOUNTER — Encounter: Payer: Self-pay | Admitting: Hematology & Oncology

## 2024-01-25 ENCOUNTER — Inpatient Hospital Stay: Payer: Medicare PPO | Admitting: Hematology & Oncology

## 2024-01-25 VITALS — BP 125/50 | HR 75 | Temp 98.3°F | Resp 18 | Ht 66.0 in | Wt 155.0 lb

## 2024-01-25 DIAGNOSIS — C7951 Secondary malignant neoplasm of bone: Secondary | ICD-10-CM

## 2024-01-25 DIAGNOSIS — Z170 Estrogen receptor positive status [ER+]: Secondary | ICD-10-CM | POA: Diagnosis not present

## 2024-01-25 DIAGNOSIS — Z95828 Presence of other vascular implants and grafts: Secondary | ICD-10-CM

## 2024-01-25 DIAGNOSIS — C50911 Malignant neoplasm of unspecified site of right female breast: Secondary | ICD-10-CM

## 2024-01-25 DIAGNOSIS — Z5111 Encounter for antineoplastic chemotherapy: Secondary | ICD-10-CM | POA: Diagnosis not present

## 2024-01-25 DIAGNOSIS — M7989 Other specified soft tissue disorders: Secondary | ICD-10-CM | POA: Diagnosis not present

## 2024-01-25 DIAGNOSIS — M25552 Pain in left hip: Secondary | ICD-10-CM | POA: Diagnosis not present

## 2024-01-25 DIAGNOSIS — Z452 Encounter for adjustment and management of vascular access device: Secondary | ICD-10-CM | POA: Diagnosis not present

## 2024-01-25 DIAGNOSIS — Z1721 Progesterone receptor positive status: Secondary | ICD-10-CM | POA: Diagnosis not present

## 2024-01-25 DIAGNOSIS — C50919 Malignant neoplasm of unspecified site of unspecified female breast: Secondary | ICD-10-CM

## 2024-01-25 DIAGNOSIS — C771 Secondary and unspecified malignant neoplasm of intrathoracic lymph nodes: Secondary | ICD-10-CM | POA: Diagnosis not present

## 2024-01-25 LAB — CBC WITH DIFFERENTIAL (CANCER CENTER ONLY)
Abs Immature Granulocytes: 0.01 K/uL (ref 0.00–0.07)
Basophils Absolute: 0 K/uL (ref 0.0–0.1)
Basophils Relative: 2 %
Eosinophils Absolute: 0 K/uL (ref 0.0–0.5)
Eosinophils Relative: 1 %
HCT: 27.7 % — ABNORMAL LOW (ref 36.0–46.0)
Hemoglobin: 9.7 g/dL — ABNORMAL LOW (ref 12.0–15.0)
Immature Granulocytes: 0 %
Lymphocytes Relative: 17 %
Lymphs Abs: 0.5 K/uL — ABNORMAL LOW (ref 0.7–4.0)
MCH: 38.8 pg — ABNORMAL HIGH (ref 26.0–34.0)
MCHC: 35 g/dL (ref 30.0–36.0)
MCV: 110.8 fL — ABNORMAL HIGH (ref 80.0–100.0)
Monocytes Absolute: 0.5 K/uL (ref 0.1–1.0)
Monocytes Relative: 17 %
Neutro Abs: 1.7 K/uL (ref 1.7–7.7)
Neutrophils Relative %: 63 %
Platelet Count: 193 K/uL (ref 150–400)
RBC: 2.5 MIL/uL — ABNORMAL LOW (ref 3.87–5.11)
RDW: 14.3 % (ref 11.5–15.5)
WBC Count: 2.7 K/uL — ABNORMAL LOW (ref 4.0–10.5)
nRBC: 0 % (ref 0.0–0.2)

## 2024-01-25 LAB — CMP (CANCER CENTER ONLY)
ALT: 15 U/L (ref 0–44)
AST: 23 U/L (ref 15–41)
Albumin: 3.7 g/dL (ref 3.5–5.0)
Alkaline Phosphatase: 55 U/L (ref 38–126)
Anion gap: 7 (ref 5–15)
BUN: 22 mg/dL (ref 8–23)
CO2: 29 mmol/L (ref 22–32)
Calcium: 8.8 mg/dL — ABNORMAL LOW (ref 8.9–10.3)
Chloride: 105 mmol/L (ref 98–111)
Creatinine: 1.39 mg/dL — ABNORMAL HIGH (ref 0.44–1.00)
GFR, Estimated: 41 mL/min — ABNORMAL LOW
Glucose, Bld: 100 mg/dL — ABNORMAL HIGH (ref 70–99)
Potassium: 3.9 mmol/L (ref 3.5–5.1)
Sodium: 141 mmol/L (ref 135–145)
Total Bilirubin: 0.4 mg/dL (ref 0.0–1.2)
Total Protein: 5.9 g/dL — ABNORMAL LOW (ref 6.5–8.1)

## 2024-01-25 MED ORDER — FULVESTRANT 250 MG/5ML IM SOSY
500.0000 mg | PREFILLED_SYRINGE | Freq: Once | INTRAMUSCULAR | Status: AC
  Administered 2024-01-25: 500 mg via INTRAMUSCULAR

## 2024-01-25 MED ORDER — SODIUM CHLORIDE 0.9% FLUSH
10.0000 mL | Freq: Once | INTRAVENOUS | Status: AC
  Administered 2024-01-25: 10 mL via INTRAVENOUS

## 2024-01-25 MED ORDER — HEPARIN SOD (PORK) LOCK FLUSH 100 UNIT/ML IV SOLN
500.0000 [IU] | Freq: Once | INTRAVENOUS | Status: AC
  Administered 2024-01-25: 500 [IU] via INTRAVENOUS

## 2024-01-25 NOTE — Progress Notes (Signed)
Hematology and Oncology Follow Up Visit  Tracy Thompson [id number] 11/20/1951 73 y.o. 01/25/2024   Principle Diagnosis:  Metastatic breast cancer-bone metastasis/hilar lymph node metastasis- ER+/PR+/HER-2 (2+) -- PIK3CA (+)  Current Therapy:   Faslodex 500 mg IM monthly -start on 11/09/2022 Ribociclib 600 mg p.o. daily (21/7) --start on 11/09/2022 --changed to 400 mg p.o. daily started on 09/05/2023 Zometa 4 mg IV every 3 months - next dose Feb 2025 Radiation therapy to left hip -completed in 11/23/2022     Interim History:  Tracy Thompson is back for follow-up.  Everything looks fantastic.  She had a PET scan that was done on 12/19/2023.  This did not show any active breast cancer.  So happy for her.  There was some activity in the right shoulder which was felt to be more arthritis.  Her last CA 27.29 back in November was 24.  She has tolerated the ribociclib and the Faslodex quite nicely.  She has had no issues with nausea or vomiting.  She has had no bleeding.  There is been no diarrhea.  She has had no cough or shortness of breath.  She has had chronic leg swelling with the left leg.  Overall, I would say that her performance status is probably ECOG 1.   Wt Readings from Last 3 Encounters:  01/25/24 155 lb (70.3 kg)  12/29/23 157 lb 1.9 oz (71.3 kg)  11/22/23 153 lb (69.4 kg)     Medications:  Current Outpatient Medications:    amLODipine (NORVASC) 2.5 MG tablet, Take 1 pill a day., Disp: 90 tablet, Rfl: 1   aspirin 81 MG tablet, Take 81 mg by mouth daily., Disp: , Rfl:    bisacodyl (DULCOLAX) 5 MG EC tablet, Take 5 mg by mouth daily as needed for moderate constipation., Disp: , Rfl:    bisoprolol-hydrochlorothiazide (ZIAC) 10-6.25 MG tablet, Take 1 tablet by mouth daily., Disp: 90 tablet, Rfl: 1   carboxymethylcellulose (REFRESH PLUS) 0.5 % SOLN, Place 1 drop into both eyes 3 (three) times daily as needed., Disp: , Rfl:    Cholecalciferol (VITAMIN D) 50 MCG (2000 UT) CAPS,  Take 2,000 Units by mouth daily., Disp: , Rfl:    Heparin Na, Pork, Lock Flsh PF 100 UNIT/ML SOLN, Inject 2.5 mLs (250 Units total) into the vein every 12 (twelve) hours., Disp: 300 mL, Rfl: 3   hydrochlorothiazide (HYDRODIURIL) 25 MG tablet, TAKE 1 TABLET(25 MG) BY MOUTH DAILY, Disp: 90 tablet, Rfl: 1   meclizine (ANTIVERT) 12.5 MG tablet, Take 1 tablet (12.5 mg total) by mouth 3 (three) times daily as needed for dizziness., Disp: 30 tablet, Rfl: 4   OLANZapine (ZYPREXA) 5 MG tablet, TAKE 1 TABLET(5 MG) BY MOUTH AT BEDTIME, Disp: 30 tablet, Rfl: 3   ondansetron (ZOFRAN) 8 MG tablet, TAKE 1 TABLET(8 MG) BY MOUTH EVERY 8 HOURS AS NEEDED FOR NAUSEA OR VOMITING, Disp: 30 tablet, Rfl: 2   potassium chloride SA (KLOR-CON M) 20 MEQ tablet, Take 1 tablet (20 mEq total) by mouth 2 (two) times daily., Disp: 60 tablet, Rfl: 2   ribociclib succ (KISQALI, 400 MG DOSE,) 200 MG Therapy Pack, Take 2 tablets (400 mg total) by mouth daily. Take for 21 days on, 7 days off, repeat every 28 days., Disp: 63 tablet, Rfl: 6   sodium chloride flush 0.9 % SOLN injection, Inject 10 mLs into the vein every 12 (twelve) hours., Disp: 600 mL, Rfl: 3   traMADol (ULTRAM) 50 MG tablet, Take 1 tablet (50 mg total)  by mouth every 6 (six) hours as needed (mild pain)., Disp: 10 tablet, Rfl: 0   zoledronic acid (ZOMETA) 4 MG/5ML injection, Inject 4 mg into the vein every 3 (three) months., Disp: , Rfl:  No current facility-administered medications for this visit.  Facility-Administered Medications Ordered in Other Visits:    fulvestrant (FASLODEX) injection 500 mg, 500 mg, Intramuscular, Once, [place], Sarah M, PA-C  Allergies: No Known Allergies  Past Medical History, Surgical history, Social history, and Family History were reviewed and updated.  Review of Systems: Review of Systems  Constitutional:  Positive for fatigue.  HENT:  Negative.    Eyes: Negative.   Respiratory: Negative.    Cardiovascular: Negative.    Gastrointestinal:  Positive for constipation and nausea.  Endocrine: Negative.   Genitourinary: Negative.    Musculoskeletal:  Positive for arthralgias and myalgias.  Skin: Negative.   Neurological: Negative.   Hematological: Negative.   Psychiatric/Behavioral: Negative.      Physical Exam: Vital signs show temperature of 98.3.  Pulse 75.  Blood pressure 125/50.  Weight is 155 pounds.    Wt Readings from Last 3 Encounters:  01/25/24 155 lb (70.3 kg)  12/29/23 157 lb 1.9 oz (71.3 kg)  11/22/23 153 lb (69.4 kg)    Physical Exam Vitals reviewed.  Constitutional:      Comments: Left breast shows no masses, edema or erythema.  There is no left axillary adenopathy.  Her right chest wall shows a well-healing mastectomy.  There is no erythema or warmth.  There is no right axillary adenopathy.  Marland Kitchen  HENT:     Head: Normocephalic and atraumatic.  Eyes:     Pupils: Pupils are equal, round, and reactive to light.  Cardiovascular:     Rate and Rhythm: Normal rate and regular rhythm.     Heart sounds: Normal heart sounds.  Pulmonary:     Effort: Pulmonary effort is normal.     Breath sounds: Normal breath sounds.  Abdominal:     General: Bowel sounds are normal.     Palpations: Abdomen is soft.  Musculoskeletal:        General: No tenderness or deformity. Normal range of motion.     Cervical back: Normal range of motion.  Lymphadenopathy:     Cervical: No cervical adenopathy.  Skin:    General: Skin is warm and dry.     Findings: No erythema or rash.  Neurological:     Mental Status: She is alert and oriented to person, place, and time.  Psychiatric:        Behavior: Behavior normal.        Thought Content: Thought content normal.        Judgment: Judgment normal.      Lab Results  Component Value Date   WBC 2.7 (L) 01/25/2024   HGB 9.7 (L) 01/25/2024   HCT 27.7 (L) 01/25/2024   MCV 110.8 (H) 01/25/2024   PLT 193 01/25/2024     Chemistry      Component Value  Date/Time   NA 141 01/25/2024 1215   K 3.9 01/25/2024 1215   CL 105 01/25/2024 1215   CO2 29 01/25/2024 1215   BUN 22 01/25/2024 1215   CREATININE 1.39 (H) 01/25/2024 1215      Component Value Date/Time   CALCIUM 8.8 (L) 01/25/2024 1215   ALKPHOS 55 01/25/2024 1215   AST 23 01/25/2024 1215   ALT 15 01/25/2024 1215   BILITOT 0.4 01/25/2024 1215  Impression and Plan: Ms. Krage is a very nice 73 year old white female.  She has metastatic breast cancer.  We have her on antiestrogen therapy.  It is apparent that the antiestrogen therapy is working quite nicely.  Again is very happy for her.  Her quality of life is doing quite well.  We will go ahead with the Faslodex today.  We will go ahead and plan to get her back to see [place] in another month.  She will get her Zometa when she comes back.    Josph Macho, MD 1/29/20251:15 PM

## 2024-01-25 NOTE — Patient Instructions (Signed)
Fulvestrant Injection What is this medication? FULVESTRANT (ful VES trant) treats breast cancer. It works by blocking the hormone estrogen in breast tissue, which prevents breast cancer cells from spreading or growing. This medicine may be used for other purposes; ask your health care provider or pharmacist if you have questions. COMMON BRAND NAME(S): FASLODEX What should I tell my care team before I take this medication? They need to know if you have any of these conditions: Bleeding disorder Liver disease Low blood cell levels (white cells, red cells, and platelets) An unusual or allergic reaction to fulvestrant, other medications, foods, dyes, or preservatives Pregnant or trying to get pregnant Breastfeeding How should I use this medication? This medication is injected into a muscle. It is given by your care team in a hospital or clinic setting. Talk to your care team about the use of this medication in children. Special care may be needed. Overdosage: If you think you have taken too much of this medicine contact a poison control center or emergency room at once. NOTE: This medicine is only for you. Do not share this medicine with others. What if I miss a dose? Keep appointments for follow-up doses. It is important not to miss your dose. Call your care team if you are unable to keep an appointment. What may interact with this medication? Fluoroestradiol F18 This list may not describe all possible interactions. Give your health care provider a list of all the medicines, herbs, non-prescription drugs, or dietary supplements you use. Also tell them if you smoke, drink alcohol, or use illegal drugs. Some items may interact with your medicine. What should I watch for while using this medication? Your condition will be monitored carefully while you are receiving this medication. You may need blood work done while you are taking this medication. This medication is injected into a muscle. Talk  to your care team if you also take medications that prevent or treat blood clots, such as warfarin. Blood thinners may increase the risk of bleeding or bruising in the muscle where this medication is injected. The benefits of this medication may outweigh the risks. Your care team can help you find the option that works for you. They can also help limit the risk of bleeding. Talk to your care team if you may be pregnant. Serious birth defects can occur if you take this medication during pregnancy and for 1 year after the last dose. You will need a negative pregnancy test before starting this medication. Contraception is recommended while taking this medication and for 1 year after the last dose. Your care team can help you find the option that works for you. Do not breastfeed while taking this medication and for 1 year after the last dose. This medication may cause infertility. Talk to your care team if you are concerned about your fertility. What side effects may I notice from receiving this medication? Side effects that you should report to your care team as soon as possible: Allergic reactions or angioedema--skin rash, itching or hives, swelling of the face, eyes, lips, tongue, arms, or legs, trouble swallowing or breathing Pain, tingling, or numbness in the hands or feet Side effects that usually do not require medical attention (report to your care team if they continue or are bothersome): Bone, joint, or muscle pain Constipation Headache Hot flashes Nausea Pain, redness, or irritation at injection site Unusual weakness or fatigue This list may not describe all possible side effects. Call your doctor for medical advice about side  effects. You may report side effects to FDA at 1-800-FDA-1088. Where should I keep my medication? This medication is given in a hospital or clinic. It will not be stored at home. NOTE: This sheet is a summary. It may not cover all possible information. If you have  questions about this medicine, talk to your doctor, pharmacist, or health care provider.  2024 Elsevier/Gold Standard (2023-08-19 00:00:00)

## 2024-01-27 LAB — CANCER ANTIGEN 27.29: CA 27.29: 24.4 U/mL (ref 0.0–38.6)

## 2024-01-30 ENCOUNTER — Other Ambulatory Visit (HOSPITAL_BASED_OUTPATIENT_CLINIC_OR_DEPARTMENT_OTHER): Payer: Self-pay

## 2024-02-01 ENCOUNTER — Inpatient Hospital Stay: Payer: Medicare PPO | Attending: Hematology & Oncology

## 2024-02-01 VITALS — BP 140/66 | HR 77 | Temp 97.6°F | Resp 19

## 2024-02-01 DIAGNOSIS — Z170 Estrogen receptor positive status [ER+]: Secondary | ICD-10-CM | POA: Diagnosis not present

## 2024-02-01 DIAGNOSIS — C50912 Malignant neoplasm of unspecified site of left female breast: Secondary | ICD-10-CM

## 2024-02-01 DIAGNOSIS — C7951 Secondary malignant neoplasm of bone: Secondary | ICD-10-CM | POA: Diagnosis not present

## 2024-02-01 DIAGNOSIS — Z452 Encounter for adjustment and management of vascular access device: Secondary | ICD-10-CM | POA: Insufficient documentation

## 2024-02-01 DIAGNOSIS — Z5111 Encounter for antineoplastic chemotherapy: Secondary | ICD-10-CM | POA: Diagnosis not present

## 2024-02-01 DIAGNOSIS — C771 Secondary and unspecified malignant neoplasm of intrathoracic lymph nodes: Secondary | ICD-10-CM | POA: Diagnosis not present

## 2024-02-01 DIAGNOSIS — Z1732 Human epidermal growth factor receptor 2 negative status: Secondary | ICD-10-CM | POA: Diagnosis not present

## 2024-02-01 DIAGNOSIS — C50919 Malignant neoplasm of unspecified site of unspecified female breast: Secondary | ICD-10-CM | POA: Diagnosis not present

## 2024-02-01 DIAGNOSIS — Z1721 Progesterone receptor positive status: Secondary | ICD-10-CM | POA: Insufficient documentation

## 2024-02-01 MED ORDER — HEPARIN SOD (PORK) LOCK FLUSH 100 UNIT/ML IV SOLN
500.0000 [IU] | Freq: Once | INTRAVENOUS | Status: AC
  Administered 2024-02-01: 250 [IU] via INTRAVENOUS

## 2024-02-01 MED ORDER — SODIUM CHLORIDE 0.9% FLUSH
10.0000 mL | Freq: Once | INTRAVENOUS | Status: AC
  Administered 2024-02-01: 10 mL

## 2024-02-01 NOTE — Patient Instructions (Signed)
 PICC Home Care Guide A peripherally inserted central catheter (PICC) is a form of IV access that allows medicines and IV fluids to be quickly put into the blood and spread throughout the body. The PICC is a long, thin, flexible tube (catheter) that is put into a vein in a person's arm or leg. The catheter ends in a large vein just outside the heart called the superior vena cava (SVC). After the PICC is put in, a chest X-ray may be done to make sure that it is in the right place. A PICC may be placed for different reasons, such as: To give medicines and liquid nutrition. To give IV fluids and blood products. To take blood samples often. If there is trouble placing a peripheral intravenous (PIV) catheter. If cared for properly, a PICC can remain in place for many months. Having a PICC can allow you to go home from the hospital sooner and continue treatment at home. Medicines and PICC care can be managed at home by a family member, caregiver, or home health care team. What are the risks? Generally, having a PICC is safe. However, problems may occur, including: A blood clot (thrombus) forming in or at the end of the PICC. A blood clot forming in a vein (deep vein thrombosis) or traveling to the lung (pulmonary embolism). Inflammation of the vein (phlebitis) in which the PICC is placed. Infection at the insertion site or in the blood. Blood infections from central lines, like PICCs, can be serious and often require a hospital stay. PICC malposition, or PICC movement or poor placement. A break or cut in the PICC. Do not use scissors near the PICC. Nerve or tendon irritation or injury during PICC insertion. How to care for your PICC Please follow the specific guidelines provided by your health care provider. Preventing infection You and any caregivers should wash your hands often with soap and water for at least 20 seconds. Wash hands: Before touching the PICC or the infusion device. Before changing a  bandage (dressing). Do not change the dressing unless you have been taught to do so and have shown you are able to change it safely. Flush the PICC as told. Tell your health care provider right away if the PICC is hard to flush or does not flush. Do not use force to flush the PICC. Use clean and germ-free (sterile) supplies only. Keep the supplies in a dry place. Do not reuse needles, syringes, or any other supplies. Reusing supplies can lead to infection. Keep the PICC dressing dry and secure it with tape if the edges stop sticking to your skin. Check your PICC insertion site every day for signs of infection. Check for: Redness, swelling, or pain. Fluid or blood. Warmth. Pus or a bad smell. Preventing other problems Do not use a syringe that is less than 10 mL to flush the PICC. Do not have your blood pressure checked on the arm in which the PICC is placed. Do not ever pull or tug on the PICC. Keep it secured to your arm with tape or a stretch wrap when not in use. Do not take the PICC out yourself. Only a trained health care provider should remove the PICC. Keep pets and children away from your PICC. How to care for your PICC dressing Keep your PICC dressing clean and dry to prevent infection. Do not take baths, swim, or use a hot tub until your health care provider approves. Ask your health care provider if you can take  showers. You may only be allowed to take sponge baths. When you are allowed to shower: Ask your health care provider to teach you how to wrap the PICC. Cover the PICC with clear plastic wrap and tape to keep it dry while showering. Follow instructions from your health care provider about how to take care of your insertion site and dressing. Make sure you: Wash your hands with soap and water for at least 20 seconds before and after you change your dressing. If soap and water are not available, use hand sanitizer. Change your dressing only if taught to do so by your health care  provider. Your PICC dressing needs to be changed if it becomes loose or wet. Leave stitches (sutures), skin glue, or adhesive strips in place. These skin closures may need to stay in place for 2 weeks or longer. If adhesive strip edges start to loosen and curl up, you may trim the loose edges. Do not remove adhesive strips completely unless your health care provider tells you to do that. Follow these instructions at home: Disposal of supplies Throw away any syringes in a disposal container that is meant for sharp items (sharps container). You can buy a sharps container from a pharmacy, or you can make one by using an empty, hard plastic bottle with a lid. Place any used dressings or infusion bags into a plastic bag. Throw that bag in the trash. General instructions  Always carry your PICC identification card or wear a medical alert bracelet. Keep the tube clamped at all times, unless it is being used. Always carry a smooth-edge clamp with you to clamp the PICC if it breaks. Do not use scissors or sharp objects near the tube. You may bend your arm and move it freely. If your PICC is near or at the bend of your elbow, avoid activity with repeated motion at the elbow. Avoid lifting heavy objects as told by your health care provider. Keep all follow-up visits. This is important. You will need to have your PICC dressing changed at least once a week. Contact a health care provider if: You have pain in your arm, ear, face, or teeth. You have a fever or chills. You have redness, swelling, or pain around the insertion site. You have fluid or blood coming from the insertion site. Your insertion site feels warm to the touch. You have pus or a bad smell coming from the insertion site. Your skin feels hard and raised around the insertion site. Your PICC dressing has gotten wet or is coming off and you have not been taught how to change it. Get help right away if: You have problems with your PICC, such as  your PICC: Was tugged or pulled and has partially come out. Do not  push the PICC back in. Cannot be flushed, is hard to flush, or leaks around the insertion site when it is flushed. Makes a flushing sound when it is flushed. Appears to have a hole or tear. Is accidentally pulled all the way out. If this happens, cover the insertion site with a gauze dressing. Do not throw the PICC away. Your health care provider will need to check it to be sure the entire catheter came out. You feel your heart racing or skipping beats, or you have chest pain. You have shortness of breath or trouble breathing. You have swelling, redness, warmth, or pain in the arm in which the PICC is placed. You have a red streak going up your arm that  starts under the PICC dressing. These symptoms may be an emergency. Get help right away. Call 911. Do not wait to see if the symptoms will go away. Do not drive yourself to the hospital. Summary A peripherally inserted central catheter (PICC) is a long, thin, flexible tube (catheter) that is put into a vein in the arm or leg. If cared for properly, a PICC can remain in place for many months. Having a PICC can allow you to go home from the hospital sooner and continue treatment at home. The PICC is inserted using a germ-free (sterile) technique by a specially trained health care provider. Only a trained health care provider should remove it. Do not have your blood pressure checked on the arm in which your PICC is placed. Always keep your PICC identification card with you. This information is not intended to replace advice given to you by your health care provider. Make sure you discuss any questions you have with your health care provider. Document Revised: 07/01/2021 Document Reviewed: 07/01/2021 Elsevier Patient Education  2024 [employer].

## 2024-02-03 ENCOUNTER — Other Ambulatory Visit: Payer: Self-pay

## 2024-02-05 ENCOUNTER — Other Ambulatory Visit: Payer: Self-pay | Admitting: Hematology & Oncology

## 2024-02-06 ENCOUNTER — Encounter: Payer: Self-pay | Admitting: Hematology & Oncology

## 2024-02-08 ENCOUNTER — Other Ambulatory Visit: Payer: Self-pay

## 2024-02-08 ENCOUNTER — Other Ambulatory Visit (HOSPITAL_COMMUNITY): Payer: Self-pay

## 2024-02-08 ENCOUNTER — Other Ambulatory Visit: Payer: Self-pay | Admitting: Pharmacy Technician

## 2024-02-08 ENCOUNTER — Inpatient Hospital Stay: Payer: Medicare PPO

## 2024-02-08 VITALS — BP 110/56 | HR 73 | Temp 97.6°F | Resp 17

## 2024-02-08 DIAGNOSIS — Z95828 Presence of other vascular implants and grafts: Secondary | ICD-10-CM

## 2024-02-08 DIAGNOSIS — C50919 Malignant neoplasm of unspecified site of unspecified female breast: Secondary | ICD-10-CM | POA: Diagnosis not present

## 2024-02-08 DIAGNOSIS — C7951 Secondary malignant neoplasm of bone: Secondary | ICD-10-CM | POA: Diagnosis not present

## 2024-02-08 DIAGNOSIS — Z170 Estrogen receptor positive status [ER+]: Secondary | ICD-10-CM | POA: Diagnosis not present

## 2024-02-08 DIAGNOSIS — Z1732 Human epidermal growth factor receptor 2 negative status: Secondary | ICD-10-CM | POA: Diagnosis not present

## 2024-02-08 DIAGNOSIS — Z1721 Progesterone receptor positive status: Secondary | ICD-10-CM | POA: Diagnosis not present

## 2024-02-08 DIAGNOSIS — Z5111 Encounter for antineoplastic chemotherapy: Secondary | ICD-10-CM | POA: Diagnosis not present

## 2024-02-08 DIAGNOSIS — Z452 Encounter for adjustment and management of vascular access device: Secondary | ICD-10-CM | POA: Diagnosis not present

## 2024-02-08 DIAGNOSIS — C771 Secondary and unspecified malignant neoplasm of intrathoracic lymph nodes: Secondary | ICD-10-CM | POA: Diagnosis not present

## 2024-02-08 MED ORDER — SODIUM CHLORIDE 0.9% FLUSH
10.0000 mL | Freq: Once | INTRAVENOUS | Status: AC
  Administered 2024-02-08: 10 mL via INTRAVENOUS

## 2024-02-08 MED ORDER — HEPARIN SOD (PORK) LOCK FLUSH 100 UNIT/ML IV SOLN
500.0000 [IU] | Freq: Once | INTRAVENOUS | Status: AC
  Administered 2024-02-08: 500 [IU] via INTRAVENOUS

## 2024-02-08 NOTE — Progress Notes (Signed)
Specialty Pharmacy Refill Coordination Note  Tracy Thompson is a 73 y.o. female contacted today regarding refills of specialty medication(s) Ribociclib Succinate (Kisqali (400 MG Dose))   Patient requested Delivery   Delivery date: 02/15/24   Verified address: Patient address [address]  [place]   Medication will be filled on 02/14/24.

## 2024-02-13 ENCOUNTER — Other Ambulatory Visit: Payer: Self-pay

## 2024-02-13 NOTE — Progress Notes (Signed)
 Patient was contacted via mychart that due to possible impending winter storm, medication will arrive on Tuesday 02/14/24.

## 2024-02-14 ENCOUNTER — Other Ambulatory Visit: Payer: Self-pay

## 2024-02-14 ENCOUNTER — Inpatient Hospital Stay: Payer: Medicare PPO

## 2024-02-14 VITALS — BP 140/77 | HR 84 | Temp 98.0°F | Resp 17

## 2024-02-14 DIAGNOSIS — C7951 Secondary malignant neoplasm of bone: Secondary | ICD-10-CM | POA: Diagnosis not present

## 2024-02-14 DIAGNOSIS — C771 Secondary and unspecified malignant neoplasm of intrathoracic lymph nodes: Secondary | ICD-10-CM | POA: Diagnosis not present

## 2024-02-14 DIAGNOSIS — Z1732 Human epidermal growth factor receptor 2 negative status: Secondary | ICD-10-CM | POA: Diagnosis not present

## 2024-02-14 DIAGNOSIS — C50919 Malignant neoplasm of unspecified site of unspecified female breast: Secondary | ICD-10-CM

## 2024-02-14 DIAGNOSIS — Z170 Estrogen receptor positive status [ER+]: Secondary | ICD-10-CM | POA: Diagnosis not present

## 2024-02-14 DIAGNOSIS — Z1721 Progesterone receptor positive status: Secondary | ICD-10-CM | POA: Diagnosis not present

## 2024-02-14 DIAGNOSIS — Z5111 Encounter for antineoplastic chemotherapy: Secondary | ICD-10-CM | POA: Diagnosis not present

## 2024-02-14 DIAGNOSIS — Z452 Encounter for adjustment and management of vascular access device: Secondary | ICD-10-CM | POA: Diagnosis not present

## 2024-02-14 MED ORDER — HEPARIN SOD (PORK) LOCK FLUSH 100 UNIT/ML IV SOLN
250.0000 [IU] | Freq: Once | INTRAVENOUS | Status: AC | PRN
  Administered 2024-02-14: 250 [IU]

## 2024-02-14 MED ORDER — HEPARIN SOD (PORK) LOCK FLUSH 100 UNIT/ML IV SOLN: 500.0000 [IU] | Freq: Once | INTRAVENOUS | Status: DC | PRN

## 2024-02-14 NOTE — Progress Notes (Signed)
 Tommy called from [employer] saying that he received a call from his driver that patient was not home to receive delivery. Tommy wanted to ensure this package was not signature required. Per chart and notes, this package does not require a signature and Tracy Thompson will let driver know it is okay to leave delivery at door.

## 2024-02-15 ENCOUNTER — Inpatient Hospital Stay: Payer: Medicare PPO

## 2024-02-22 ENCOUNTER — Other Ambulatory Visit: Payer: Medicare PPO

## 2024-02-22 ENCOUNTER — Encounter: Payer: Self-pay | Admitting: Hematology & Oncology

## 2024-02-22 ENCOUNTER — Inpatient Hospital Stay: Payer: Medicare PPO | Admitting: Hematology & Oncology

## 2024-02-22 ENCOUNTER — Inpatient Hospital Stay: Payer: Medicare PPO

## 2024-02-22 ENCOUNTER — Other Ambulatory Visit: Payer: Self-pay

## 2024-02-22 VITALS — BP 113/55 | HR 77 | Temp 98.4°F | Resp 16 | Ht 66.0 in | Wt 156.0 lb

## 2024-02-22 DIAGNOSIS — Z170 Estrogen receptor positive status [ER+]: Secondary | ICD-10-CM | POA: Diagnosis not present

## 2024-02-22 DIAGNOSIS — C7951 Secondary malignant neoplasm of bone: Secondary | ICD-10-CM | POA: Diagnosis not present

## 2024-02-22 DIAGNOSIS — C50919 Malignant neoplasm of unspecified site of unspecified female breast: Secondary | ICD-10-CM | POA: Diagnosis not present

## 2024-02-22 DIAGNOSIS — Z1721 Progesterone receptor positive status: Secondary | ICD-10-CM | POA: Diagnosis not present

## 2024-02-22 DIAGNOSIS — Z1732 Human epidermal growth factor receptor 2 negative status: Secondary | ICD-10-CM | POA: Diagnosis not present

## 2024-02-22 DIAGNOSIS — Z5111 Encounter for antineoplastic chemotherapy: Secondary | ICD-10-CM | POA: Diagnosis not present

## 2024-02-22 DIAGNOSIS — C50911 Malignant neoplasm of unspecified site of right female breast: Secondary | ICD-10-CM

## 2024-02-22 DIAGNOSIS — C771 Secondary and unspecified malignant neoplasm of intrathoracic lymph nodes: Secondary | ICD-10-CM | POA: Diagnosis not present

## 2024-02-22 DIAGNOSIS — Z452 Encounter for adjustment and management of vascular access device: Secondary | ICD-10-CM | POA: Diagnosis not present

## 2024-02-22 DIAGNOSIS — C50912 Malignant neoplasm of unspecified site of left female breast: Secondary | ICD-10-CM

## 2024-02-22 LAB — CMP (CANCER CENTER ONLY)
ALT: 14 U/L (ref 0–44)
AST: 25 U/L (ref 15–41)
Albumin: 3.6 g/dL (ref 3.5–5.0)
Alkaline Phosphatase: 54 U/L (ref 38–126)
Anion gap: 6 (ref 5–15)
BUN: 19 mg/dL (ref 8–23)
CO2: 29 mmol/L (ref 22–32)
Calcium: 8.9 mg/dL (ref 8.9–10.3)
Chloride: 103 mmol/L (ref 98–111)
Creatinine: 1.47 mg/dL — ABNORMAL HIGH (ref 0.44–1.00)
GFR, Estimated: 38 mL/min — ABNORMAL LOW (ref >60)
Glucose, Bld: 116 mg/dL — ABNORMAL HIGH (ref 70–99)
Potassium: 4 mmol/L (ref 3.5–5.1)
Sodium: 138 mmol/L (ref 135–145)
Total Bilirubin: 0.4 mg/dL (ref 0.0–1.2)
Total Protein: 5.8 g/dL — ABNORMAL LOW (ref 6.5–8.1)

## 2024-02-22 LAB — CBC WITH DIFFERENTIAL (CANCER CENTER ONLY)
Abs Immature Granulocytes: 0.01 10*3/uL (ref 0.00–0.07)
Basophils Absolute: 0 10*3/uL (ref 0.0–0.1)
Basophils Relative: 1 %
Eosinophils Absolute: 0 10*3/uL (ref 0.0–0.5)
Eosinophils Relative: 1 %
HCT: 27.3 % — ABNORMAL LOW (ref 36.0–46.0)
Hemoglobin: 9.8 g/dL — ABNORMAL LOW (ref 12.0–15.0)
Immature Granulocytes: 1 %
Lymphocytes Relative: 22 %
Lymphs Abs: 0.5 10*3/uL — ABNORMAL LOW (ref 0.7–4.0)
MCH: 39.5 pg — ABNORMAL HIGH (ref 26.0–34.0)
MCHC: 35.9 g/dL (ref 30.0–36.0)
MCV: 110.1 fL — ABNORMAL HIGH (ref 80.0–100.0)
Monocytes Absolute: 0.4 10*3/uL (ref 0.1–1.0)
Monocytes Relative: 18 %
Neutro Abs: 1.2 10*3/uL — ABNORMAL LOW (ref 1.7–7.7)
Neutrophils Relative %: 57 %
Platelet Count: 183 10*3/uL (ref 150–400)
RBC: 2.48 MIL/uL — ABNORMAL LOW (ref 3.87–5.11)
RDW: 14 % (ref 11.5–15.5)
WBC Count: 2.1 10*3/uL — ABNORMAL LOW (ref 4.0–10.5)
nRBC: 0 % (ref 0.0–0.2)

## 2024-02-22 MED ORDER — HEPARIN SOD (PORK) LOCK FLUSH 100 UNIT/ML IV SOLN
500.0000 [IU] | Freq: Once | INTRAVENOUS | Status: AC
  Administered 2024-02-22: 500 [IU] via INTRAVENOUS

## 2024-02-22 MED ORDER — SODIUM CHLORIDE 0.9% FLUSH
10.0000 mL | Freq: Once | INTRAVENOUS | Status: AC
  Administered 2024-02-22: 10 mL via INTRAVENOUS

## 2024-02-22 MED ORDER — FULVESTRANT 250 MG/5ML IM SOSY
500.0000 mg | PREFILLED_SYRINGE | Freq: Once | INTRAMUSCULAR | Status: AC
  Administered 2024-02-22: 500 mg via INTRAMUSCULAR
  Filled 2024-02-22: qty 10

## 2024-02-22 MED ORDER — ZOLEDRONIC ACID 4 MG/100ML IV SOLN
4.0000 mg | Freq: Once | INTRAVENOUS | Status: AC
  Administered 2024-02-22: 4 mg via INTRAVENOUS
  Filled 2024-02-22: qty 100

## 2024-02-22 MED ORDER — SODIUM CHLORIDE 0.9 % IV SOLN: INTRAVENOUS | Status: DC

## 2024-02-22 MED ORDER — SODIUM CHLORIDE 0.9% FLUSH
10.0000 mL | INTRAVENOUS | Status: DC | PRN
  Administered 2024-02-22: 10 mL via INTRAVENOUS

## 2024-02-22 NOTE — Patient Instructions (Signed)
 Fulvestrant Injection What is this medication? FULVESTRANT (ful VES trant) treats breast cancer. It works by blocking the hormone estrogen in breast tissue, which prevents breast cancer cells from spreading or growing. This medicine may be used for other purposes; ask your health care provider or pharmacist if you have questions. COMMON BRAND NAME(S): FASLODEX What should I tell my care team before I take this medication? They need to know if you have any of these conditions: Bleeding disorder Liver disease Low blood cell levels (white cells, red cells, and platelets) An unusual or allergic reaction to fulvestrant, other medications, foods, dyes, or preservatives Pregnant or trying to get pregnant Breastfeeding How should I use this medication? This medication is injected into a muscle. It is given by your care team in a hospital or clinic setting. Talk to your care team about the use of this medication in children. Special care may be needed. Overdosage: If you think you have taken too much of this medicine contact a poison control center or emergency room at once. NOTE: This medicine is only for you. Do not share this medicine with others. What if I miss a dose? Keep appointments for follow-up doses. It is important not to miss your dose. Call your care team if you are unable to keep an appointment. What may interact with this medication? Fluoroestradiol F18 This list may not describe all possible interactions. Give your health care provider a list of all the medicines, herbs, non-prescription drugs, or dietary supplements you use. Also tell them if you smoke, drink alcohol, or use illegal drugs. Some items may interact with your medicine. What should I watch for while using this medication? Your condition will be monitored carefully while you are receiving this medication. You may need blood work done while you are taking this medication. This medication is injected into a muscle. Talk  to your care team if you also take medications that prevent or treat blood clots, such as warfarin. Blood thinners may increase the risk of bleeding or bruising in the muscle where this medication is injected. The benefits of this medication may outweigh the risks. Your care team can help you find the option that works for you. They can also help limit the risk of bleeding. Talk to your care team if you may be pregnant. Serious birth defects can occur if you take this medication during pregnancy and for 1 year after the last dose. You will need a negative pregnancy test before starting this medication. Contraception is recommended while taking this medication and for 1 year after the last dose. Your care team can help you find the option that works for you. Do not breastfeed while taking this medication and for 1 year after the last dose. This medication may cause infertility. Talk to your care team if you are concerned about your fertility. What side effects may I notice from receiving this medication? Side effects that you should report to your care team as soon as possible: Allergic reactions or angioedema--skin rash, itching or hives, swelling of the face, eyes, lips, tongue, arms, or legs, trouble swallowing or breathing Pain, tingling, or numbness in the hands or feet Side effects that usually do not require medical attention (report to your care team if they continue or are bothersome): Bone, joint, or muscle pain Constipation Headache Hot flashes Nausea Pain, redness, or irritation at injection site Unusual weakness or fatigue This list may not describe all possible side effects. Call your doctor for medical advice about side  effects. You may report side effects to FDA at 1-800-FDA-1088. Where should I keep my medication? This medication is given in a hospital or clinic. It will not be stored at home. NOTE: This sheet is a summary. It may not cover all possible information. If you have  questions about this medicine, talk to your doctor, pharmacist, or health care provider.  2024 Elsevier/Gold Standard (2023-08-19 00:00:00)  Zoledronic Acid Injection (Cancer) What is this medication? ZOLEDRONIC ACID (ZOE le dron ik AS id) treats high calcium levels in the blood caused by cancer. It may also be used with chemotherapy to treat weakened bones caused by cancer. It works by slowing down the release of calcium from bones. This lowers calcium levels in your blood. It also makes your bones stronger and less likely to break (fracture). It belongs to a group of medications called bisphosphonates. This medicine may be used for other purposes; ask your health care provider or pharmacist if you have questions. COMMON BRAND NAME(S): Zometa, Zometa Powder What should I tell my care team before I take this medication? They need to know if you have any of these conditions: Dehydration Dental disease Kidney disease Liver disease Low levels of calcium in the blood Lung or breathing disease, such as asthma Receiving steroids, such as dexamethasone or prednisone An unusual or allergic reaction to zoledronic acid, other medications, foods, dyes, or preservatives Pregnant or trying to get pregnant Breast-feeding How should I use this medication? This medication is injected into a vein. It is given by your care team in a hospital or clinic setting. Talk to your care team about the use of this medication in children. Special care may be needed. Overdosage: If you think you have taken too much of this medicine contact a poison control center or emergency room at once. NOTE: This medicine is only for you. Do not share this medicine with others. What if I miss a dose? Keep appointments for follow-up doses. It is important not to miss your dose. Call your care team if you are unable to keep an appointment. What may interact with this medication? Certain antibiotics given by injection Diuretics,  such as bumetanide, furosemide NSAIDs, medications for pain and inflammation, such as ibuprofen or naproxen Teriparatide Thalidomide This list may not describe all possible interactions. Give your health care provider a list of all the medicines, herbs, non-prescription drugs, or dietary supplements you use. Also tell them if you smoke, drink alcohol, or use illegal drugs. Some items may interact with your medicine. What should I watch for while using this medication? Visit your care team for regular checks on your progress. It may be some time before you see the benefit from this medication. Some people who take this medication have severe bone, joint, or muscle pain. This medication may also increase your risk for jaw problems or a broken thigh bone. Tell your care team right away if you have severe pain in your jaw, bones, joints, or muscles. Tell you care team if you have any pain that does not go away or that gets worse. Tell your dentist and dental surgeon that you are taking this medication. You should not have major dental surgery while on this medication. See your dentist to have a dental exam and fix any dental problems before starting this medication. Take good care of your teeth while on this medication. Make sure you see your dentist for regular follow-up appointments. You should make sure you get enough calcium and vitamin D while  you are taking this medication. Discuss the foods you eat and the vitamins you take with your care team. Check with your care team if you have severe diarrhea, nausea, and vomiting, or if you sweat a lot. The loss of too much body fluid may make it dangerous for you to take this medication. You may need bloodwork while taking this medication. Talk to your care team if you wish to become pregnant or think you might be pregnant. This medication can cause serious birth defects. What side effects may I notice from receiving this medication? Side effects that you  should report to your care team as soon as possible: Allergic reactions--skin rash, itching, hives, swelling of the face, lips, tongue, or throat Kidney injury--decrease in the amount of urine, swelling of the ankles, hands, or feet Low calcium level--muscle pain or cramps, confusion, tingling, or numbness in the hands or feet Osteonecrosis of the jaw--pain, swelling, or redness in the mouth, numbness of the jaw, poor healing after dental work, unusual discharge from the mouth, visible bones in the mouth Severe bone, joint, or muscle pain Side effects that usually do not require medical attention (report to your care team if they continue or are bothersome): Constipation Fatigue Fever Loss of appetite Nausea Stomach pain This list may not describe all possible side effects. Call your doctor for medical advice about side effects. You may report side effects to FDA at 1-800-FDA-1088. Where should I keep my medication? This medication is given in a hospital or clinic. It will not be stored at home. NOTE: This sheet is a summary. It may not cover all possible information. If you have questions about this medicine, talk to your doctor, pharmacist, or health care provider.  2024 Elsevier/Gold Standard (2022-02-05 00:00:00)

## 2024-02-22 NOTE — Progress Notes (Signed)
 Hematology and Oncology Follow Up Visit  Tracy Thompson [id number] 27-Aug-1951 73 y.o. 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 daily (21/7) --start on 11/09/2022 --changed to 400 mg p.o. daily started on 09/05/2023 Zometa 4 mg IV every 3 months - next dose 04/2024 Radiation therapy to left hip -completed in 11/23/2022     Interim History:  Tracy Thompson is back for follow-up.  Overall, I think she is doing pretty well.  She really has had no complaints.  Some friends of had COVID.  She is being very cautious.  Her last CA 27.29 was pretty much stable at 24.  She has had no problems with nausea vomiting.  Is been no change in bowel or bladder habits.  She has had no cough or shortness of breath.  She is tolerating the ribociclib very nicely.  She has had no rashes.  She has had no bleeding.  There has been no headache.  She has had no mouth sores.  Overall, I would have to say that her performance status is probably ECOG 1.    Wt Readings from Last 3 Encounters:  01/25/24 155 lb (70.3 kg)  12/29/23 157 lb 1.9 oz (71.3 kg)  11/22/23 153 lb (69.4 kg)     Medications:  Current Outpatient Medications:    amLODipine (NORVASC) 2.5 MG tablet, Take 1 pill a day., Disp: 90 tablet, Rfl: 1   aspirin 81 MG tablet, Take 81 mg by mouth daily., Disp: , Rfl:    bisacodyl (DULCOLAX) 5 MG EC tablet, Take 5 mg by mouth daily as needed for moderate constipation., Disp: , Rfl:    bisoprolol-hydrochlorothiazide (ZIAC) 10-6.25 MG tablet, Take 1 tablet by mouth daily., Disp: 90 tablet, Rfl: 1   carboxymethylcellulose (REFRESH PLUS) 0.5 % SOLN, Place 1 drop into both eyes 3 (three) times daily as needed., Disp: , Rfl:    Cholecalciferol (VITAMIN D) 50 MCG (2000 UT) CAPS, Take 2,000 Units by mouth daily., Disp: , Rfl:     Heparin Na, Pork, Lock Flsh PF 100 UNIT/ML SOLN, Inject 2.5 mLs (250 Units total) into the vein every 12 (twelve) hours., Disp: 300 mL, Rfl: 3   hydrochlorothiazide (HYDRODIURIL) 25 MG tablet, TAKE 1 TABLET(25 MG) BY MOUTH DAILY, Disp: 90 tablet, Rfl: 1   meclizine (ANTIVERT) 12.5 MG tablet, Take 1 tablet (12.5 mg total) by mouth 3 (three) times daily as needed for dizziness., Disp: 30 tablet, Rfl: 4   OLANZapine (ZYPREXA) 5 MG tablet, TAKE 1 TABLET(5 MG) BY MOUTH AT BEDTIME, Disp: 30 tablet, Rfl: 3   ondansetron (ZOFRAN) 8 MG tablet, TAKE 1 TABLET(8 MG) BY MOUTH EVERY 8 HOURS AS NEEDED FOR NAUSEA OR VOMITING, Disp: 30 tablet, Rfl: 2   potassium chloride SA (KLOR-CON M) 20 MEQ tablet, Take 1 tablet (20 mEq total) by mouth 2 (two) times daily., Disp: 60 tablet, Rfl: 2   ribociclib succ (KISQALI, 400 MG DOSE,) 200 MG Therapy Pack, Take 2 tablets (400 mg total) by mouth daily. Take for 21 days on, 7 days off, repeat every 28 days., Disp: 63 tablet, Rfl: 6   sodium chloride flush 0.9 % SOLN injection, Inject 10 mLs into the vein every 12 (twelve) hours., Disp: 600 mL, Rfl: 3   traMADol (ULTRAM) 50 MG tablet, Take 1 tablet (50 mg total) by mouth every 6 (six) hours  as needed (mild pain)., Disp: 10 tablet, Rfl: 0   zoledronic acid (ZOMETA) 4 MG/5ML injection, Inject 4 mg into the vein every 3 (three) months., Disp: , Rfl:   Allergies: No Known Allergies  Past Medical History, Surgical history, Social history, and Family History were reviewed and updated.  Review of Systems: Review of Systems  Constitutional:  Positive for fatigue.  HENT:  Negative.    Eyes: Negative.   Respiratory: Negative.    Cardiovascular: Negative.   Gastrointestinal:  Positive for constipation and nausea.  Endocrine: Negative.   Genitourinary: Negative.    Musculoskeletal:  Positive for arthralgias and myalgias.  Skin: Negative.   Neurological: Negative.   Hematological: Negative.   Psychiatric/Behavioral: Negative.       Physical Exam: Vital signs show temperature of 98.4.  Pulse 77.  Blood pressure 113/55.  Weight is 156 pounds.      Wt Readings from Last 3 Encounters:  01/25/24 155 lb (70.3 kg)  12/29/23 157 lb 1.9 oz (71.3 kg)  11/22/23 153 lb (69.4 kg)    Physical Exam Vitals reviewed.  Constitutional:      Comments: Left breast shows no masses, edema or erythema.  There is no left axillary adenopathy.  Her right chest wall shows a well-healing mastectomy.  There is no erythema or warmth.  There is no right axillary adenopathy.  Marland Kitchen  HENT:     Head: Normocephalic and atraumatic.  Eyes:     Pupils: Pupils are equal, round, and reactive to light.  Cardiovascular:     Rate and Rhythm: Normal rate and regular rhythm.     Heart sounds: Normal heart sounds.  Pulmonary:     Effort: Pulmonary effort is normal.     Breath sounds: Normal breath sounds.  Abdominal:     General: Bowel sounds are normal.     Palpations: Abdomen is soft.  Musculoskeletal:        General: No tenderness or deformity. Normal range of motion.     Cervical back: Normal range of motion.  Lymphadenopathy:     Cervical: No cervical adenopathy.  Skin:    General: Skin is warm and dry.     Findings: No erythema or rash.  Neurological:     Mental Status: She is alert and oriented to person, place, and time.  Psychiatric:        Behavior: Behavior normal.        Thought Content: Thought content normal.        Judgment: Judgment normal.     Lab Results  Component Value Date   WBC 2.1 (L) 02/22/2024   HGB 9.8 (L) 02/22/2024   HCT 27.3 (L) 02/22/2024   MCV 110.1 (H) 02/22/2024   PLT 183 02/22/2024     Chemistry      Component Value Date/Time   NA 138 02/22/2024 1202   K 4.0 02/22/2024 1202   CL 103 02/22/2024 1202   CO2 29 02/22/2024 1202   BUN 19 02/22/2024 1202   CREATININE 1.47 (H) 02/22/2024 1202      Component Value Date/Time   CALCIUM 8.9 02/22/2024 1202   ALKPHOS 54 02/22/2024 1202   AST 25  02/22/2024 1202   ALT 14 02/22/2024 1202   BILITOT 0.4 02/22/2024 1202        Impression and Plan: Ms. Kinlaw is a very nice 73 year old white female.  She has metastatic breast cancer.  We have her on antiestrogen therapy.  So far, I think she is done  well with the antiestrogen therapy.  I see no reason to make any changes right now.  She will get her Zometa today.  As always, we will plan to get her back in 1 month.    Josph Macho, MD 2/26/20251:15 PM

## 2024-02-22 NOTE — Patient Instructions (Signed)
 PICC Home Care Guide A peripherally inserted central catheter (PICC) is a form of IV access that allows medicines and IV fluids to be quickly put into the blood and spread throughout the body. The PICC is a long, thin, flexible tube (catheter) that is put into a vein in a person's arm or leg. The catheter ends in a large vein just outside the heart called the superior vena cava (SVC). After the PICC is put in, a chest X-ray may be done to make sure that it is in the right place. A PICC may be placed for different reasons, such as: To give medicines and liquid nutrition. To give IV fluids and blood products. To take blood samples often. If there is trouble placing a peripheral intravenous (PIV) catheter. If cared for properly, a PICC can remain in place for many months. Having a PICC can allow you to go home from the hospital sooner and continue treatment at home. Medicines and PICC care can be managed at home by a family member, caregiver, or home health care team. What are the risks? Generally, having a PICC is safe. However, problems may occur, including: A blood clot (thrombus) forming in or at the end of the PICC. A blood clot forming in a vein (deep vein thrombosis) or traveling to the lung (pulmonary embolism). Inflammation of the vein (phlebitis) in which the PICC is placed. Infection at the insertion site or in the blood. Blood infections from central lines, like PICCs, can be serious and often require a hospital stay. PICC malposition, or PICC movement or poor placement. A break or cut in the PICC. Do not use scissors near the PICC. Nerve or tendon irritation or injury during PICC insertion. How to care for your PICC Please follow the specific guidelines provided by your health care provider. Preventing infection You and any caregivers should wash your hands often with soap and water for at least 20 seconds. Wash hands: Before touching the PICC or the infusion device. Before changing a  bandage (dressing). Do not change the dressing unless you have been taught to do so and have shown you are able to change it safely. Flush the PICC as told. Tell your health care provider right away if the PICC is hard to flush or does not flush. Do not use force to flush the PICC. Use clean and germ-free (sterile) supplies only. Keep the supplies in a dry place. Do not reuse needles, syringes, or any other supplies. Reusing supplies can lead to infection. Keep the PICC dressing dry and secure it with tape if the edges stop sticking to your skin. Check your PICC insertion site every day for signs of infection. Check for: Redness, swelling, or pain. Fluid or blood. Warmth. Pus or a bad smell. Preventing other problems Do not use a syringe that is less than 10 mL to flush the PICC. Do not have your blood pressure checked on the arm in which the PICC is placed. Do not ever pull or tug on the PICC. Keep it secured to your arm with tape or a stretch wrap when not in use. Do not take the PICC out yourself. Only a trained health care provider should remove the PICC. Keep pets and children away from your PICC. How to care for your PICC dressing Keep your PICC dressing clean and dry to prevent infection. Do not take baths, swim, or use a hot tub until your health care provider approves. Ask your health care provider if you can take  showers. You may only be allowed to take sponge baths. When you are allowed to shower: Ask your health care provider to teach you how to wrap the PICC. Cover the PICC with clear plastic wrap and tape to keep it dry while showering. Follow instructions from your health care provider about how to take care of your insertion site and dressing. Make sure you: Wash your hands with soap and water for at least 20 seconds before and after you change your dressing. If soap and water are not available, use hand sanitizer. Change your dressing only if taught to do so by your health care  provider. Your PICC dressing needs to be changed if it becomes loose or wet. Leave stitches (sutures), skin glue, or adhesive strips in place. These skin closures may need to stay in place for 2 weeks or longer. If adhesive strip edges start to loosen and curl up, you may trim the loose edges. Do not remove adhesive strips completely unless your health care provider tells you to do that. Follow these instructions at home: Disposal of supplies Throw away any syringes in a disposal container that is meant for sharp items (sharps container). You can buy a sharps container from a pharmacy, or you can make one by using an empty, hard plastic bottle with a lid. Place any used dressings or infusion bags into a plastic bag. Throw that bag in the trash. General instructions  Always carry your PICC identification card or wear a medical alert bracelet. Keep the tube clamped at all times, unless it is being used. Always carry a smooth-edge clamp with you to clamp the PICC if it breaks. Do not use scissors or sharp objects near the tube. You may bend your arm and move it freely. If your PICC is near or at the bend of your elbow, avoid activity with repeated motion at the elbow. Avoid lifting heavy objects as told by your health care provider. Keep all follow-up visits. This is important. You will need to have your PICC dressing changed at least once a week. Contact a health care provider if: You have pain in your arm, ear, face, or teeth. You have a fever or chills. You have redness, swelling, or pain around the insertion site. You have fluid or blood coming from the insertion site. Your insertion site feels warm to the touch. You have pus or a bad smell coming from the insertion site. Your skin feels hard and raised around the insertion site. Your PICC dressing has gotten wet or is coming off and you have not been taught how to change it. Get help right away if: You have problems with your PICC, such as  your PICC: Was tugged or pulled and has partially come out. Do not  push the PICC back in. Cannot be flushed, is hard to flush, or leaks around the insertion site when it is flushed. Makes a flushing sound when it is flushed. Appears to have a hole or tear. Is accidentally pulled all the way out. If this happens, cover the insertion site with a gauze dressing. Do not throw the PICC away. Your health care provider will need to check it to be sure the entire catheter came out. You feel your heart racing or skipping beats, or you have chest pain. You have shortness of breath or trouble breathing. You have swelling, redness, warmth, or pain in the arm in which the PICC is placed. You have a red streak going up your arm that  starts under the PICC dressing. These symptoms may be an emergency. Get help right away. Call 911. Do not wait to see if the symptoms will go away. Do not drive yourself to the hospital. Summary A peripherally inserted central catheter (PICC) is a long, thin, flexible tube (catheter) that is put into a vein in the arm or leg. If cared for properly, a PICC can remain in place for many months. Having a PICC can allow you to go home from the hospital sooner and continue treatment at home. The PICC is inserted using a germ-free (sterile) technique by a specially trained health care provider. Only a trained health care provider should remove it. Do not have your blood pressure checked on the arm in which your PICC is placed. Always keep your PICC identification card with you. This information is not intended to replace advice given to you by your health care provider. Make sure you discuss any questions you have with your health care provider. Document Revised: 07/01/2021 Document Reviewed: 07/01/2021 Elsevier Patient Education  2024 [employer].

## 2024-02-23 ENCOUNTER — Other Ambulatory Visit: Payer: Self-pay | Admitting: Family Medicine

## 2024-02-23 ENCOUNTER — Encounter: Payer: Self-pay | Admitting: Family Medicine

## 2024-02-23 DIAGNOSIS — I10 Essential (primary) hypertension: Secondary | ICD-10-CM

## 2024-02-23 LAB — CANCER ANTIGEN 27.29: CA 27.29: 26 U/mL (ref 0.0–38.6)

## 2024-02-23 MED ORDER — BISOPROLOL-HYDROCHLOROTHIAZIDE 10-6.25 MG PO TABS
1.0000 | ORAL_TABLET | Freq: Every day | ORAL | 1 refills | Status: DC
Start: 2024-02-23 — End: 2024-08-17

## 2024-02-23 MED ORDER — AMLODIPINE BESYLATE 2.5 MG PO TABS: ORAL_TABLET | ORAL | 1 refills | Status: DC

## 2024-02-29 ENCOUNTER — Inpatient Hospital Stay: Payer: Medicare PPO | Attending: Hematology & Oncology

## 2024-02-29 VITALS — BP 92/73 | HR 74 | Temp 98.8°F | Resp 18

## 2024-02-29 DIAGNOSIS — Z5111 Encounter for antineoplastic chemotherapy: Secondary | ICD-10-CM | POA: Diagnosis not present

## 2024-02-29 DIAGNOSIS — C7951 Secondary malignant neoplasm of bone: Secondary | ICD-10-CM | POA: Diagnosis not present

## 2024-02-29 DIAGNOSIS — Z1731 Human epidermal growth factor receptor 2 positive status: Secondary | ICD-10-CM | POA: Insufficient documentation

## 2024-02-29 DIAGNOSIS — Z170 Estrogen receptor positive status [ER+]: Secondary | ICD-10-CM | POA: Insufficient documentation

## 2024-02-29 DIAGNOSIS — C50912 Malignant neoplasm of unspecified site of left female breast: Secondary | ICD-10-CM

## 2024-02-29 DIAGNOSIS — Z1721 Progesterone receptor positive status: Secondary | ICD-10-CM | POA: Diagnosis not present

## 2024-02-29 DIAGNOSIS — Z79899 Other long term (current) drug therapy: Secondary | ICD-10-CM | POA: Insufficient documentation

## 2024-02-29 DIAGNOSIS — C50919 Malignant neoplasm of unspecified site of unspecified female breast: Secondary | ICD-10-CM | POA: Diagnosis not present

## 2024-02-29 DIAGNOSIS — Z7982 Long term (current) use of aspirin: Secondary | ICD-10-CM | POA: Insufficient documentation

## 2024-02-29 DIAGNOSIS — C771 Secondary and unspecified malignant neoplasm of intrathoracic lymph nodes: Secondary | ICD-10-CM | POA: Diagnosis not present

## 2024-02-29 DIAGNOSIS — Z923 Personal history of irradiation: Secondary | ICD-10-CM | POA: Insufficient documentation

## 2024-02-29 MED ORDER — HEPARIN SOD (PORK) LOCK FLUSH 100 UNIT/ML IV SOLN
250.0000 [IU] | Freq: Once | INTRAVENOUS | Status: AC
  Administered 2024-02-29: 250 [IU] via INTRAVENOUS

## 2024-02-29 MED ORDER — SODIUM CHLORIDE 0.9% FLUSH
10.0000 mL | Freq: Once | INTRAVENOUS | Status: AC
  Administered 2024-02-29: 10 mL

## 2024-02-29 NOTE — Patient Instructions (Signed)
 PICC Home Care Guide A peripherally inserted central catheter (PICC) is a form of IV access that allows medicines and IV fluids to be quickly put into the blood and spread throughout the body. The PICC is a long, thin, flexible tube (catheter) that is put into a vein in a person's arm or leg. The catheter ends in a large vein just outside the heart called the superior vena cava (SVC). After the PICC is put in, a chest X-ray may be done to make sure that it is in the right place. A PICC may be placed for different reasons, such as: To give medicines and liquid nutrition. To give IV fluids and blood products. To take blood samples often. If there is trouble placing a peripheral intravenous (PIV) catheter. If cared for properly, a PICC can remain in place for many months. Having a PICC can allow you to go home from the hospital sooner and continue treatment at home. Medicines and PICC care can be managed at home by a family member, caregiver, or home health care team. What are the risks? Generally, having a PICC is safe. However, problems may occur, including: A blood clot (thrombus) forming in or at the end of the PICC. A blood clot forming in a vein (deep vein thrombosis) or traveling to the lung (pulmonary embolism). Inflammation of the vein (phlebitis) in which the PICC is placed. Infection at the insertion site or in the blood. Blood infections from central lines, like PICCs, can be serious and often require a hospital stay. PICC malposition, or PICC movement or poor placement. A break or cut in the PICC. Do not use scissors near the PICC. Nerve or tendon irritation or injury during PICC insertion. How to care for your PICC Please follow the specific guidelines provided by your health care provider. Preventing infection You and any caregivers should wash your hands often with soap and water for at least 20 seconds. Wash hands: Before touching the PICC or the infusion device. Before changing a  bandage (dressing). Do not change the dressing unless you have been taught to do so and have shown you are able to change it safely. Flush the PICC as told. Tell your health care provider right away if the PICC is hard to flush or does not flush. Do not use force to flush the PICC. Use clean and germ-free (sterile) supplies only. Keep the supplies in a dry place. Do not reuse needles, syringes, or any other supplies. Reusing supplies can lead to infection. Keep the PICC dressing dry and secure it with tape if the edges stop sticking to your skin. Check your PICC insertion site every day for signs of infection. Check for: Redness, swelling, or pain. Fluid or blood. Warmth. Pus or a bad smell. Preventing other problems Do not use a syringe that is less than 10 mL to flush the PICC. Do not have your blood pressure checked on the arm in which the PICC is placed. Do not ever pull or tug on the PICC. Keep it secured to your arm with tape or a stretch wrap when not in use. Do not take the PICC out yourself. Only a trained health care provider should remove the PICC. Keep pets and children away from your PICC. How to care for your PICC dressing Keep your PICC dressing clean and dry to prevent infection. Do not take baths, swim, or use a hot tub until your health care provider approves. Ask your health care provider if you can take  showers. You may only be allowed to take sponge baths. When you are allowed to shower: Ask your health care provider to teach you how to wrap the PICC. Cover the PICC with clear plastic wrap and tape to keep it dry while showering. Follow instructions from your health care provider about how to take care of your insertion site and dressing. Make sure you: Wash your hands with soap and water for at least 20 seconds before and after you change your dressing. If soap and water are not available, use hand sanitizer. Change your dressing only if taught to do so by your health care  provider. Your PICC dressing needs to be changed if it becomes loose or wet. Leave stitches (sutures), skin glue, or adhesive strips in place. These skin closures may need to stay in place for 2 weeks or longer. If adhesive strip edges start to loosen and curl up, you may trim the loose edges. Do not remove adhesive strips completely unless your health care provider tells you to do that. Follow these instructions at home: Disposal of supplies Throw away any syringes in a disposal container that is meant for sharp items (sharps container). You can buy a sharps container from a pharmacy, or you can make one by using an empty, hard plastic bottle with a lid. Place any used dressings or infusion bags into a plastic bag. Throw that bag in the trash. General instructions  Always carry your PICC identification card or wear a medical alert bracelet. Keep the tube clamped at all times, unless it is being used. Always carry a smooth-edge clamp with you to clamp the PICC if it breaks. Do not use scissors or sharp objects near the tube. You may bend your arm and move it freely. If your PICC is near or at the bend of your elbow, avoid activity with repeated motion at the elbow. Avoid lifting heavy objects as told by your health care provider. Keep all follow-up visits. This is important. You will need to have your PICC dressing changed at least once a week. Contact a health care provider if: You have pain in your arm, ear, face, or teeth. You have a fever or chills. You have redness, swelling, or pain around the insertion site. You have fluid or blood coming from the insertion site. Your insertion site feels warm to the touch. You have pus or a bad smell coming from the insertion site. Your skin feels hard and raised around the insertion site. Your PICC dressing has gotten wet or is coming off and you have not been taught how to change it. Get help right away if: You have problems with your PICC, such as  your PICC: Was tugged or pulled and has partially come out. Do not  push the PICC back in. Cannot be flushed, is hard to flush, or leaks around the insertion site when it is flushed. Makes a flushing sound when it is flushed. Appears to have a hole or tear. Is accidentally pulled all the way out. If this happens, cover the insertion site with a gauze dressing. Do not throw the PICC away. Your health care provider will need to check it to be sure the entire catheter came out. You feel your heart racing or skipping beats, or you have chest pain. You have shortness of breath or trouble breathing. You have swelling, redness, warmth, or pain in the arm in which the PICC is placed. You have a red streak going up your arm that  starts under the PICC dressing. These symptoms may be an emergency. Get help right away. Call 911. Do not wait to see if the symptoms will go away. Do not drive yourself to the hospital. Summary A peripherally inserted central catheter (PICC) is a long, thin, flexible tube (catheter) that is put into a vein in the arm or leg. If cared for properly, a PICC can remain in place for many months. Having a PICC can allow you to go home from the hospital sooner and continue treatment at home. The PICC is inserted using a germ-free (sterile) technique by a specially trained health care provider. Only a trained health care provider should remove it. Do not have your blood pressure checked on the arm in which your PICC is placed. Always keep your PICC identification card with you. This information is not intended to replace advice given to you by your health care provider. Make sure you discuss any questions you have with your health care provider. Document Revised: 07/01/2021 Document Reviewed: 07/01/2021 Elsevier Patient Education  2024 [employer].

## 2024-03-01 ENCOUNTER — Other Ambulatory Visit: Payer: Self-pay | Admitting: Hematology & Oncology

## 2024-03-05 ENCOUNTER — Other Ambulatory Visit: Payer: Self-pay

## 2024-03-05 ENCOUNTER — Other Ambulatory Visit (HOSPITAL_BASED_OUTPATIENT_CLINIC_OR_DEPARTMENT_OTHER): Payer: Self-pay

## 2024-03-06 ENCOUNTER — Other Ambulatory Visit: Payer: Self-pay

## 2024-03-06 NOTE — Progress Notes (Signed)
 Specialty Pharmacy Refill Coordination Note  Tracy Thompson is a 73 y.o. female contacted today regarding refills of specialty medication(s) Ribociclib Succinate (Kisqali (400 MG Dose))   Patient requested Delivery   Delivery date: 03/14/24   Verified address: Patient address [address]  [place]   Medication will be filled on 03/13/24.

## 2024-03-06 NOTE — Progress Notes (Signed)
 Specialty Pharmacy Ongoing Clinical Assessment Note  Tracy Thompson is a 73 y.o. female who is being followed by the specialty pharmacy service for RxSp Oncology   Patient's specialty medication(s) reviewed today: Ribociclib Succinate (Kisqali (400 MG Dose))   Missed doses in the last 4 weeks: 0   Patient/Caregiver did not have any additional questions or concerns.   Therapeutic benefit summary: Patient is achieving benefit   Adverse events/side effects summary: No adverse events/side effects   Patient's therapy is appropriate to: Continue    Goals Addressed             This Visit's Progress    Slow Disease Progression       Patient is on track. Patient will maintain adherence. Per 02/22/24 provider note patient remains stable.          Follow up:  6 months  Otto Herb Specialty Pharmacist

## 2024-03-07 ENCOUNTER — Inpatient Hospital Stay: Payer: Medicare PPO

## 2024-03-07 VITALS — BP 110/75 | HR 77 | Temp 98.1°F | Resp 17

## 2024-03-07 DIAGNOSIS — Z7982 Long term (current) use of aspirin: Secondary | ICD-10-CM | POA: Diagnosis not present

## 2024-03-07 DIAGNOSIS — C50912 Malignant neoplasm of unspecified site of left female breast: Secondary | ICD-10-CM

## 2024-03-07 DIAGNOSIS — Z170 Estrogen receptor positive status [ER+]: Secondary | ICD-10-CM | POA: Diagnosis not present

## 2024-03-07 DIAGNOSIS — C771 Secondary and unspecified malignant neoplasm of intrathoracic lymph nodes: Secondary | ICD-10-CM | POA: Diagnosis not present

## 2024-03-07 DIAGNOSIS — Z79899 Other long term (current) drug therapy: Secondary | ICD-10-CM | POA: Diagnosis not present

## 2024-03-07 DIAGNOSIS — C50919 Malignant neoplasm of unspecified site of unspecified female breast: Secondary | ICD-10-CM | POA: Diagnosis not present

## 2024-03-07 DIAGNOSIS — Z1731 Human epidermal growth factor receptor 2 positive status: Secondary | ICD-10-CM | POA: Diagnosis not present

## 2024-03-07 DIAGNOSIS — C7951 Secondary malignant neoplasm of bone: Secondary | ICD-10-CM | POA: Diagnosis not present

## 2024-03-07 DIAGNOSIS — Z1721 Progesterone receptor positive status: Secondary | ICD-10-CM | POA: Diagnosis not present

## 2024-03-07 DIAGNOSIS — Z5111 Encounter for antineoplastic chemotherapy: Secondary | ICD-10-CM | POA: Diagnosis not present

## 2024-03-07 MED ORDER — HEPARIN SOD (PORK) LOCK FLUSH 100 UNIT/ML IV SOLN
500.0000 [IU] | Freq: Once | INTRAVENOUS | Status: AC
  Administered 2024-03-07: 500 [IU] via INTRAVENOUS

## 2024-03-07 MED ORDER — SODIUM CHLORIDE 0.9% FLUSH
10.0000 mL | INTRAVENOUS | Status: DC | PRN
  Administered 2024-03-07: 10 mL via INTRAVENOUS

## 2024-03-07 NOTE — Patient Instructions (Signed)
 PICC Home Care Guide A peripherally inserted central catheter (PICC) is a form of IV access that allows medicines and IV fluids to be quickly put into the blood and spread throughout the body. The PICC is a long, thin, flexible tube (catheter) that is put into a vein in a person's arm or leg. The catheter ends in a large vein just outside the heart called the superior vena cava (SVC). After the PICC is put in, a chest X-ray may be done to make sure that it is in the right place. A PICC may be placed for different reasons, such as: To give medicines and liquid nutrition. To give IV fluids and blood products. To take blood samples often. If there is trouble placing a peripheral intravenous (PIV) catheter. If cared for properly, a PICC can remain in place for many months. Having a PICC can allow you to go home from the hospital sooner and continue treatment at home. Medicines and PICC care can be managed at home by a family member, caregiver, or home health care team. What are the risks? Generally, having a PICC is safe. However, problems may occur, including: A blood clot (thrombus) forming in or at the end of the PICC. A blood clot forming in a vein (deep vein thrombosis) or traveling to the lung (pulmonary embolism). Inflammation of the vein (phlebitis) in which the PICC is placed. Infection at the insertion site or in the blood. Blood infections from central lines, like PICCs, can be serious and often require a hospital stay. PICC malposition, or PICC movement or poor placement. A break or cut in the PICC. Do not use scissors near the PICC. Nerve or tendon irritation or injury during PICC insertion. How to care for your PICC Please follow the specific guidelines provided by your health care provider. Preventing infection You and any caregivers should wash your hands often with soap and water for at least 20 seconds. Wash hands: Before touching the PICC or the infusion device. Before changing a  bandage (dressing). Do not change the dressing unless you have been taught to do so and have shown you are able to change it safely. Flush the PICC as told. Tell your health care provider right away if the PICC is hard to flush or does not flush. Do not use force to flush the PICC. Use clean and germ-free (sterile) supplies only. Keep the supplies in a dry place. Do not reuse needles, syringes, or any other supplies. Reusing supplies can lead to infection. Keep the PICC dressing dry and secure it with tape if the edges stop sticking to your skin. Check your PICC insertion site every day for signs of infection. Check for: Redness, swelling, or pain. Fluid or blood. Warmth. Pus or a bad smell. Preventing other problems Do not use a syringe that is less than 10 mL to flush the PICC. Do not have your blood pressure checked on the arm in which the PICC is placed. Do not ever pull or tug on the PICC. Keep it secured to your arm with tape or a stretch wrap when not in use. Do not take the PICC out yourself. Only a trained health care provider should remove the PICC. Keep pets and children away from your PICC. How to care for your PICC dressing Keep your PICC dressing clean and dry to prevent infection. Do not take baths, swim, or use a hot tub until your health care provider approves. Ask your health care provider if you can take  showers. You may only be allowed to take sponge baths. When you are allowed to shower: Ask your health care provider to teach you how to wrap the PICC. Cover the PICC with clear plastic wrap and tape to keep it dry while showering. Follow instructions from your health care provider about how to take care of your insertion site and dressing. Make sure you: Wash your hands with soap and water for at least 20 seconds before and after you change your dressing. If soap and water are not available, use hand sanitizer. Change your dressing only if taught to do so by your health care  provider. Your PICC dressing needs to be changed if it becomes loose or wet. Leave stitches (sutures), skin glue, or adhesive strips in place. These skin closures may need to stay in place for 2 weeks or longer. If adhesive strip edges start to loosen and curl up, you may trim the loose edges. Do not remove adhesive strips completely unless your health care provider tells you to do that. Follow these instructions at home: Disposal of supplies Throw away any syringes in a disposal container that is meant for sharp items (sharps container). You can buy a sharps container from a pharmacy, or you can make one by using an empty, hard plastic bottle with a lid. Place any used dressings or infusion bags into a plastic bag. Throw that bag in the trash. General instructions  Always carry your PICC identification card or wear a medical alert bracelet. Keep the tube clamped at all times, unless it is being used. Always carry a smooth-edge clamp with you to clamp the PICC if it breaks. Do not use scissors or sharp objects near the tube. You may bend your arm and move it freely. If your PICC is near or at the bend of your elbow, avoid activity with repeated motion at the elbow. Avoid lifting heavy objects as told by your health care provider. Keep all follow-up visits. This is important. You will need to have your PICC dressing changed at least once a week. Contact a health care provider if: You have pain in your arm, ear, face, or teeth. You have a fever or chills. You have redness, swelling, or pain around the insertion site. You have fluid or blood coming from the insertion site. Your insertion site feels warm to the touch. You have pus or a bad smell coming from the insertion site. Your skin feels hard and raised around the insertion site. Your PICC dressing has gotten wet or is coming off and you have not been taught how to change it. Get help right away if: You have problems with your PICC, such as  your PICC: Was tugged or pulled and has partially come out. Do not  push the PICC back in. Cannot be flushed, is hard to flush, or leaks around the insertion site when it is flushed. Makes a flushing sound when it is flushed. Appears to have a hole or tear. Is accidentally pulled all the way out. If this happens, cover the insertion site with a gauze dressing. Do not throw the PICC away. Your health care provider will need to check it to be sure the entire catheter came out. You feel your heart racing or skipping beats, or you have chest pain. You have shortness of breath or trouble breathing. You have swelling, redness, warmth, or pain in the arm in which the PICC is placed. You have a red streak going up your arm that  starts under the PICC dressing. These symptoms may be an emergency. Get help right away. Call 911. Do not wait to see if the symptoms will go away. Do not drive yourself to the hospital. Summary A peripherally inserted central catheter (PICC) is a long, thin, flexible tube (catheter) that is put into a vein in the arm or leg. If cared for properly, a PICC can remain in place for many months. Having a PICC can allow you to go home from the hospital sooner and continue treatment at home. The PICC is inserted using a germ-free (sterile) technique by a specially trained health care provider. Only a trained health care provider should remove it. Do not have your blood pressure checked on the arm in which your PICC is placed. Always keep your PICC identification card with you. This information is not intended to replace advice given to you by your health care provider. Make sure you discuss any questions you have with your health care provider. Document Revised: 07/01/2021 Document Reviewed: 07/01/2021 Elsevier Patient Education  2024 [employer].

## 2024-03-08 ENCOUNTER — Other Ambulatory Visit: Payer: Self-pay | Admitting: Hematology & Oncology

## 2024-03-09 DIAGNOSIS — H2513 Age-related nuclear cataract, bilateral: Secondary | ICD-10-CM | POA: Diagnosis not present

## 2024-03-09 DIAGNOSIS — H35041 Retinal micro-aneurysms, unspecified, right eye: Secondary | ICD-10-CM | POA: Diagnosis not present

## 2024-03-13 ENCOUNTER — Other Ambulatory Visit: Payer: Self-pay

## 2024-03-14 ENCOUNTER — Inpatient Hospital Stay: Payer: Medicare PPO

## 2024-03-14 VITALS — BP 127/71 | HR 69 | Temp 98.1°F | Resp 16

## 2024-03-14 DIAGNOSIS — C50919 Malignant neoplasm of unspecified site of unspecified female breast: Secondary | ICD-10-CM | POA: Diagnosis not present

## 2024-03-14 DIAGNOSIS — Z170 Estrogen receptor positive status [ER+]: Secondary | ICD-10-CM | POA: Diagnosis not present

## 2024-03-14 DIAGNOSIS — Z5111 Encounter for antineoplastic chemotherapy: Secondary | ICD-10-CM | POA: Diagnosis not present

## 2024-03-14 DIAGNOSIS — C7951 Secondary malignant neoplasm of bone: Secondary | ICD-10-CM | POA: Diagnosis not present

## 2024-03-14 DIAGNOSIS — Z79899 Other long term (current) drug therapy: Secondary | ICD-10-CM | POA: Diagnosis not present

## 2024-03-14 DIAGNOSIS — Z1721 Progesterone receptor positive status: Secondary | ICD-10-CM | POA: Diagnosis not present

## 2024-03-14 DIAGNOSIS — Z1731 Human epidermal growth factor receptor 2 positive status: Secondary | ICD-10-CM | POA: Diagnosis not present

## 2024-03-14 DIAGNOSIS — Z7982 Long term (current) use of aspirin: Secondary | ICD-10-CM | POA: Diagnosis not present

## 2024-03-14 DIAGNOSIS — C771 Secondary and unspecified malignant neoplasm of intrathoracic lymph nodes: Secondary | ICD-10-CM | POA: Diagnosis not present

## 2024-03-14 DIAGNOSIS — Z95828 Presence of other vascular implants and grafts: Secondary | ICD-10-CM

## 2024-03-14 MED ORDER — HEPARIN SOD (PORK) LOCK FLUSH 100 UNIT/ML IV SOLN
500.0000 [IU] | Freq: Once | INTRAVENOUS | Status: AC
  Administered 2024-03-14: 500 [IU] via INTRAVENOUS

## 2024-03-14 MED ORDER — SODIUM CHLORIDE 0.9% FLUSH
10.0000 mL | Freq: Once | INTRAVENOUS | Status: AC
  Administered 2024-03-14: 10 mL via INTRAVENOUS

## 2024-03-21 ENCOUNTER — Inpatient Hospital Stay: Payer: Medicare PPO

## 2024-03-21 ENCOUNTER — Encounter: Payer: Self-pay | Admitting: Hematology & Oncology

## 2024-03-21 ENCOUNTER — Inpatient Hospital Stay: Payer: Medicare PPO | Admitting: Hematology & Oncology

## 2024-03-21 VITALS — BP 110/87 | HR 82 | Temp 97.8°F | Resp 17 | Ht 66.0 in | Wt 156.0 lb

## 2024-03-21 DIAGNOSIS — Z79899 Other long term (current) drug therapy: Secondary | ICD-10-CM | POA: Diagnosis not present

## 2024-03-21 DIAGNOSIS — C50919 Malignant neoplasm of unspecified site of unspecified female breast: Secondary | ICD-10-CM

## 2024-03-21 DIAGNOSIS — C7951 Secondary malignant neoplasm of bone: Secondary | ICD-10-CM | POA: Diagnosis not present

## 2024-03-21 DIAGNOSIS — Z5111 Encounter for antineoplastic chemotherapy: Secondary | ICD-10-CM | POA: Diagnosis not present

## 2024-03-21 DIAGNOSIS — Z7982 Long term (current) use of aspirin: Secondary | ICD-10-CM | POA: Diagnosis not present

## 2024-03-21 DIAGNOSIS — C771 Secondary and unspecified malignant neoplasm of intrathoracic lymph nodes: Secondary | ICD-10-CM | POA: Diagnosis not present

## 2024-03-21 DIAGNOSIS — Z170 Estrogen receptor positive status [ER+]: Secondary | ICD-10-CM | POA: Diagnosis not present

## 2024-03-21 DIAGNOSIS — Z1721 Progesterone receptor positive status: Secondary | ICD-10-CM | POA: Diagnosis not present

## 2024-03-21 DIAGNOSIS — Z1731 Human epidermal growth factor receptor 2 positive status: Secondary | ICD-10-CM | POA: Diagnosis not present

## 2024-03-21 LAB — CBC WITH DIFFERENTIAL (CANCER CENTER ONLY)
Abs Immature Granulocytes: 0.02 10*3/uL (ref 0.00–0.07)
Basophils Absolute: 0 10*3/uL (ref 0.0–0.1)
Basophils Relative: 1 %
Eosinophils Absolute: 0 10*3/uL (ref 0.0–0.5)
Eosinophils Relative: 1 %
HCT: 29 % — ABNORMAL LOW (ref 36.0–46.0)
Hemoglobin: 10.2 g/dL — ABNORMAL LOW (ref 12.0–15.0)
Immature Granulocytes: 1 %
Lymphocytes Relative: 25 %
Lymphs Abs: 0.7 10*3/uL (ref 0.7–4.0)
MCH: 39.2 pg — ABNORMAL HIGH (ref 26.0–34.0)
MCHC: 35.2 g/dL (ref 30.0–36.0)
MCV: 111.5 fL — ABNORMAL HIGH (ref 80.0–100.0)
Monocytes Absolute: 0.4 10*3/uL (ref 0.1–1.0)
Monocytes Relative: 15 %
Neutro Abs: 1.5 10*3/uL — ABNORMAL LOW (ref 1.7–7.7)
Neutrophils Relative %: 57 %
Platelet Count: 190 10*3/uL (ref 150–400)
RBC: 2.6 MIL/uL — ABNORMAL LOW (ref 3.87–5.11)
RDW: 13.1 % (ref 11.5–15.5)
WBC Count: 2.7 10*3/uL — ABNORMAL LOW (ref 4.0–10.5)
nRBC: 0 % (ref 0.0–0.2)

## 2024-03-21 LAB — CMP (CANCER CENTER ONLY)
ALT: 24 U/L (ref 0–44)
AST: 31 U/L (ref 15–41)
Albumin: 3.7 g/dL (ref 3.5–5.0)
Alkaline Phosphatase: 87 U/L (ref 38–126)
Anion gap: 6 (ref 5–15)
BUN: 21 mg/dL (ref 8–23)
CO2: 30 mmol/L (ref 22–32)
Calcium: 8.9 mg/dL (ref 8.9–10.3)
Chloride: 105 mmol/L (ref 98–111)
Creatinine: 1.33 mg/dL — ABNORMAL HIGH (ref 0.44–1.00)
GFR, Estimated: 43 mL/min — ABNORMAL LOW (ref >60)
Glucose, Bld: 96 mg/dL (ref 70–99)
Potassium: 4 mmol/L (ref 3.5–5.1)
Sodium: 141 mmol/L (ref 135–145)
Total Bilirubin: 0.3 mg/dL (ref 0.0–1.2)
Total Protein: 6 g/dL — ABNORMAL LOW (ref 6.5–8.1)

## 2024-03-21 MED ORDER — FULVESTRANT 250 MG/5ML IM SOSY
500.0000 mg | PREFILLED_SYRINGE | Freq: Once | INTRAMUSCULAR | Status: AC
  Administered 2024-03-21: 500 mg via INTRAMUSCULAR
  Filled 2024-03-21: qty 10

## 2024-03-21 NOTE — Patient Instructions (Signed)
 Tunneled Catheter Insertion, Care After The following information offers guidance on how to care for yourself after your procedure. Your health care provider may also give you more specific instructions. If you have problems or questions, contact your health care provider. What can I expect after the procedure? After the procedure, it is common to have: Some mild redness, bruising, swelling, and pain around your catheter site. A small amount of blood or clear fluid coming from your incisions. Follow these instructions at home: Medicines Take over-the-counter and prescription medicines only as told by your health care provider. If you were prescribed an antibiotic medicine, take it as told by your health care provider. Do not stop taking the antibiotic even if you start to feel better. Incision care  Follow instructions from your health care provider about how to take care of your incisions. Make sure you: Wash your hands with soap and water for at least 20 seconds before and after you change your bandages (dressings). If soap and water are not available, use hand sanitizer. Change your dressings as told by your health care provider. Wash the area around your incisions with a germ-killing (antiseptic) solution when you change your dressings. Leave stitches (sutures), skin glue, or adhesive strips in place. These skin closures may need to stay in place for 2 weeks or longer. If adhesive strip edges start to loosen and curl up, you may trim the loose edges. Do not remove adhesive strips completely unless your health care provider tells you to do that. Keep your dressings clean and dry. Check your incision areas every day for signs of infection. Check for: More redness, swelling, or pain. More fluid or blood. Warmth. Pus or a bad smell. Catheter care  Wash your hands with soap and water for at least 20 seconds before and after caring for your catheter. If soap and water are not available, use  hand sanitizer. Keep your catheter site clean and dry. Apply an antibiotic ointment to your catheter site as told by your health care provider. Flush your catheter as told by your health care provider. This helps prevent it from becoming clogged. Leave thecaps on the ends of the catheter when not in use. Do not pull on your catheter. Activity Return to your normal activities as told by your health care provider. Ask your health care provider what activities are safe for you. Follow any other activity restrictions as instructed by your health care provider. Do not lift anything that is heavier than 10 lb (4.5 kg), or the limit that you are told, until your health care provider says that it is safe. Driving Do not drive until your health care provider approves. Ask your health care provider if the medicine prescribed to you requires you to avoid driving or using machinery. General instructions Follow your health care provider's specific instructions for the type of catheter that you have. Do not take baths, swim, or use a hot tub until your health care provider approves. Ask your health care provider if you may take showers. Keep all follow-up visits. This is important. Contact a health care provider if: You feel unusually weak or nauseous. You have a fever or chills. You have more redness, swelling, or pain at your incisions or around the area where your catheter has been inserted or where it exits. You have pus or a bad smell coming from your catheter site. Your catheter site feels warm to the touch. Your catheter is not working properly. Fluid is leaking from the  catheter, under the dressing, or around the dressing. You are unable to flush your catheter. Get help right away if: Your catheter develops a hole or it breaks. Your catheter comes loose or gets pulled completely out. If this happens, press on your catheter site firmly with a clean cloth until you can get medical help. You  develop bleeding from your catheter or your insertion site, and your bleeding does not stop. You have swelling in your shoulder, neck, chest, or face. You have pain or swelling when fluids or medicines are being given through the catheter. You have chest pain or difficulty breathing. These symptoms may represent a serious problem that is an emergency. Do not wait to see if the symptoms will go away. Get medical help right away. Call your local emergency services (911 in the [place] Do not drive yourself to the hospital. Summary After the procedure, it is common to have mild redness, swelling, and pain around your catheter site. Return to your normal activities as told by your health care provider. Ask your health care provider what activities are safe for you. Follow your health care provider's specific instructions for the type of catheter that you have. Keep your catheter site and your dressings clean and dry. Contact a health care provider if your catheter is not working properly. Get help right away if you have chest pain, difficulty breathing, or your catheter comes loose or gets pulled completely out. This information is not intended to replace advice given to you by your health care provider. Make sure you discuss any questions you have with your health care provider. Document Revised: 04/20/2021 Document Reviewed: 04/20/2021 Elsevier Patient Education  2024 [employer].

## 2024-03-21 NOTE — Progress Notes (Signed)
 Hematology and Oncology Follow Up Visit  Tracy Thompson [id number] Jun 18, 1951 73 y.o. 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 daily (21/7) --start on 11/09/2022 --changed to 400 mg p.o. daily started on 09/05/2023 Zometa 4 mg IV every 3 months - next dose 04/2024 Radiation therapy to left hip -completed in 11/23/2022     Interim History:  Tracy Thompson is back for follow-up.  Overall, I think she is doing pretty well.  She seems to be tolerating treatment nicely.  Her last CA 27.29 was stable at 26.  Her last PET scan was done back in December.  We really need to set her up with a another 1 in April.  She has had no nausea or vomiting.  There is been no change in bowel or bladder habits.  I think she may have little bit of constipation.  She is doing well on the ribociclib.  She does not complain of any arthralgias or myalgias.  She has had no rashes.  She has had no change in bowel or bladder habits.    She has had no fever.  She has had no problems with COVID or Influenza.  Overall, I would have to say that her performance status is probably ECOG 1.    Wt Readings from Last 3 Encounters:  03/21/24 156 lb (70.8 kg)  02/22/24 156 lb (70.8 kg)  01/25/24 155 lb (70.3 kg)     Medications:  Current Outpatient Medications:    amLODipine (NORVASC) 2.5 MG tablet, Take 1 pill a day., Disp: 90 tablet, Rfl: 1   aspirin 81 MG tablet, Take 81 mg by mouth daily., Disp: , Rfl:    bisoprolol-hydrochlorothiazide (ZIAC) 10-6.25 MG tablet, Take 1 tablet by mouth daily., Disp: 90 tablet, Rfl: 1   carboxymethylcellulose (REFRESH PLUS) 0.5 % SOLN, Place 1 drop into both eyes 3 (three) times daily as needed., Disp: , Rfl:    Cholecalciferol (VITAMIN D) 50 MCG (2000 UT) CAPS, Take 2,000 Units by mouth daily.,  Disp: , Rfl:    Heparin Na, Pork, Lock Flsh PF 100 UNIT/ML SOLN, Inject 2.5 mLs (250 Units total) into the vein every 12 (twelve) hours., Disp: 300 mL, Rfl: 3   meclizine (ANTIVERT) 12.5 MG tablet, Take 1 tablet (12.5 mg total) by mouth 3 (three) times daily as needed for dizziness., Disp: 30 tablet, Rfl: 4   OLANZapine (ZYPREXA) 5 MG tablet, TAKE 1 TABLET(5 MG) BY MOUTH AT BEDTIME, Disp: 30 tablet, Rfl: 3   ondansetron (ZOFRAN) 8 MG tablet, TAKE 1 TABLET(8 MG) BY MOUTH EVERY 8 HOURS AS NEEDED FOR NAUSEA OR VOMITING, Disp: 30 tablet, Rfl: 2   potassium chloride SA (KLOR-CON M) 20 MEQ tablet, Take 1 tablet (20 mEq total) by mouth 2 (two) times daily., Disp: 60 tablet, Rfl: 2   ribociclib succ (KISQALI, 400 MG DOSE,) 200 MG Therapy Pack, Take 2 tablets (400 mg total) by mouth daily. Take for 21 days on, 7 days off, repeat every 28 days., Disp: 63 tablet, Rfl: 6   sodium chloride flush 0.9 % SOLN injection, Inject 10 mLs into the vein every 12 (twelve) hours., Disp: 600 mL, Rfl: 3   traMADol (ULTRAM) 50 MG tablet, Take 1 tablet (50 mg total) by mouth every 6 (six) hours as needed., Disp: 90 tablet, Rfl: 0   zoledronic acid (ZOMETA) 4  MG/5ML injection, Inject 4 mg into the vein every 3 (three) months., Disp: , Rfl:    bisacodyl (DULCOLAX) 5 MG EC tablet, Take 5 mg by mouth daily as needed for moderate constipation. (Patient not taking: Reported on 03/21/2024), Disp: , Rfl:   Allergies: No Known Allergies  Past Medical History, Surgical history, Social history, and Family History were reviewed and updated.  Review of Systems: Review of Systems  Constitutional:  Positive for fatigue.  HENT:  Negative.    Eyes: Negative.   Respiratory: Negative.    Cardiovascular: Negative.   Gastrointestinal:  Positive for constipation and nausea.  Endocrine: Negative.   Genitourinary: Negative.    Musculoskeletal:  Positive for arthralgias and myalgias.  Skin: Negative.   Neurological: Negative.    Hematological: Negative.   Psychiatric/Behavioral: Negative.      Physical Exam: Vital signs show temperature of 97.8.  Pulse 82.  Blood pressure 110/87.  Weight is 156 pounds.      Wt Readings from Last 3 Encounters:  03/21/24 156 lb (70.8 kg)  02/22/24 156 lb (70.8 kg)  01/25/24 155 lb (70.3 kg)    Physical Exam Vitals reviewed.  Constitutional:      Comments: Left breast shows no masses, edema or erythema.  There is no left axillary adenopathy.  Her right chest wall shows a well-healing mastectomy.  There is no erythema or warmth.  There is no right axillary adenopathy.  Marland Kitchen  HENT:     Head: Normocephalic and atraumatic.  Eyes:     Pupils: Pupils are equal, round, and reactive to light.  Cardiovascular:     Rate and Rhythm: Normal rate and regular rhythm.     Heart sounds: Normal heart sounds.  Pulmonary:     Effort: Pulmonary effort is normal.     Breath sounds: Normal breath sounds.  Abdominal:     General: Bowel sounds are normal.     Palpations: Abdomen is soft.  Musculoskeletal:        General: No tenderness or deformity. Normal range of motion.     Cervical back: Normal range of motion.  Lymphadenopathy:     Cervical: No cervical adenopathy.  Skin:    General: Skin is warm and dry.     Findings: No erythema or rash.  Neurological:     Mental Status: She is alert and oriented to person, place, and time.  Psychiatric:        Behavior: Behavior normal.        Thought Content: Thought content normal.        Judgment: Judgment normal.      Lab Results  Component Value Date   WBC 2.7 (L) 03/21/2024   HGB 10.2 (L) 03/21/2024   HCT 29.0 (L) 03/21/2024   MCV 111.5 (H) 03/21/2024   PLT 190 03/21/2024     Chemistry      Component Value Date/Time   NA 141 03/21/2024 1420   K 4.0 03/21/2024 1420   CL 105 03/21/2024 1420   CO2 30 03/21/2024 1420   BUN 21 03/21/2024 1420   CREATININE 1.33 (H) 03/21/2024 1420      Component Value Date/Time   CALCIUM 8.9  03/21/2024 1420   ALKPHOS 87 03/21/2024 1420   AST 31 03/21/2024 1420   ALT 24 03/21/2024 1420   BILITOT 0.3 03/21/2024 1420        Impression and Plan: Tracy Thompson is a very nice 73 year old white female.  She has metastatic breast cancer.  We  have her on antiestrogen therapy.  So far, I think she is done well with the antiestrogen therapy.  We will set her up with a PET scan in April.  Hopefully, we will see that she is still in remission.  I just want to make sure that her quality of life is doing okay.  I think to me, that is most important.  I am happy that she is doing quite well.  She has a great attitude.  She has great support from her family.  We will plan to get her back to see [place] in another month.   Josph Macho, MD 3/26/20253:22 PM

## 2024-03-21 NOTE — Patient Instructions (Signed)
 Fulvestrant Injection What is this medication? FULVESTRANT (ful VES trant) treats breast cancer. It works by blocking the hormone estrogen in breast tissue, which prevents breast cancer cells from spreading or growing. This medicine may be used for other purposes; ask your health care provider or pharmacist if you have questions. COMMON BRAND NAME(S): FASLODEX What should I tell my care team before I take this medication? They need to know if you have any of these conditions: Bleeding disorder Liver disease Low blood cell levels (white cells, red cells, and platelets) An unusual or allergic reaction to fulvestrant, other medications, foods, dyes, or preservatives Pregnant or trying to get pregnant Breastfeeding How should I use this medication? This medication is injected into a muscle. It is given by your care team in a hospital or clinic setting. Talk to your care team about the use of this medication in children. Special care may be needed. Overdosage: If you think you have taken too much of this medicine contact a poison control center or emergency room at once. NOTE: This medicine is only for you. Do not share this medicine with others. What if I miss a dose? Keep appointments for follow-up doses. It is important not to miss your dose. Call your care team if you are unable to keep an appointment. What may interact with this medication? Fluoroestradiol F18 This list may not describe all possible interactions. Give your health care provider a list of all the medicines, herbs, non-prescription drugs, or dietary supplements you use. Also tell them if you smoke, drink alcohol, or use illegal drugs. Some items may interact with your medicine. What should I watch for while using this medication? Your condition will be monitored carefully while you are receiving this medication. You may need blood work done while you are taking this medication. This medication is injected into a muscle. Talk  to your care team if you also take medications that prevent or treat blood clots, such as warfarin. Blood thinners may increase the risk of bleeding or bruising in the muscle where this medication is injected. The benefits of this medication may outweigh the risks. Your care team can help you find the option that works for you. They can also help limit the risk of bleeding. Talk to your care team if you may be pregnant. Serious birth defects can occur if you take this medication during pregnancy and for 1 year after the last dose. You will need a negative pregnancy test before starting this medication. Contraception is recommended while taking this medication and for 1 year after the last dose. Your care team can help you find the option that works for you. Do not breastfeed while taking this medication and for 1 year after the last dose. This medication may cause infertility. Talk to your care team if you are concerned about your fertility. What side effects may I notice from receiving this medication? Side effects that you should report to your care team as soon as possible: Allergic reactions or angioedema--skin rash, itching or hives, swelling of the face, eyes, lips, tongue, arms, or legs, trouble swallowing or breathing Pain, tingling, or numbness in the hands or feet Side effects that usually do not require medical attention (report to your care team if they continue or are bothersome): Bone, joint, or muscle pain Constipation Headache Hot flashes Nausea Pain, redness, or irritation at injection site Unusual weakness or fatigue This list may not describe all possible side effects. Call your doctor for medical advice about side  effects. You may report side effects to FDA at 1-800-FDA-1088. Where should I keep my medication? This medication is given in a hospital or clinic. It will not be stored at home. NOTE: This sheet is a summary. It may not cover all possible information. If you have  questions about this medicine, talk to your doctor, pharmacist, or health care provider.  2024 Elsevier/Gold Standard (2023-08-19 00:00:00)

## 2024-03-22 LAB — CANCER ANTIGEN 27.29: CA 27.29: 31.3 U/mL (ref 0.0–38.6)

## 2024-03-28 ENCOUNTER — Inpatient Hospital Stay: Attending: Hematology & Oncology

## 2024-03-28 VITALS — BP 122/71 | HR 78 | Temp 97.7°F | Resp 18

## 2024-03-28 DIAGNOSIS — Z79899 Other long term (current) drug therapy: Secondary | ICD-10-CM | POA: Diagnosis not present

## 2024-03-28 DIAGNOSIS — C771 Secondary and unspecified malignant neoplasm of intrathoracic lymph nodes: Secondary | ICD-10-CM | POA: Insufficient documentation

## 2024-03-28 DIAGNOSIS — Z170 Estrogen receptor positive status [ER+]: Secondary | ICD-10-CM | POA: Diagnosis not present

## 2024-03-28 DIAGNOSIS — C50919 Malignant neoplasm of unspecified site of unspecified female breast: Secondary | ICD-10-CM | POA: Diagnosis not present

## 2024-03-28 DIAGNOSIS — D649 Anemia, unspecified: Secondary | ICD-10-CM | POA: Insufficient documentation

## 2024-03-28 DIAGNOSIS — Z7982 Long term (current) use of aspirin: Secondary | ICD-10-CM | POA: Diagnosis not present

## 2024-03-28 DIAGNOSIS — C7951 Secondary malignant neoplasm of bone: Secondary | ICD-10-CM | POA: Insufficient documentation

## 2024-03-28 DIAGNOSIS — Z5111 Encounter for antineoplastic chemotherapy: Secondary | ICD-10-CM | POA: Diagnosis not present

## 2024-03-28 DIAGNOSIS — Z1721 Progesterone receptor positive status: Secondary | ICD-10-CM | POA: Insufficient documentation

## 2024-03-28 DIAGNOSIS — Z95828 Presence of other vascular implants and grafts: Secondary | ICD-10-CM

## 2024-03-28 MED ORDER — SODIUM CHLORIDE 0.9% FLUSH
10.0000 mL | Freq: Once | INTRAVENOUS | Status: AC
  Administered 2024-03-28: 10 mL via INTRAVENOUS

## 2024-03-28 MED ORDER — HEPARIN SOD (PORK) LOCK FLUSH 100 UNIT/ML IV SOLN
500.0000 [IU] | Freq: Once | INTRAVENOUS | Status: AC
  Administered 2024-03-28: 500 [IU] via INTRAVENOUS

## 2024-03-28 NOTE — Patient Instructions (Signed)

## 2024-04-02 ENCOUNTER — Other Ambulatory Visit: Payer: Self-pay

## 2024-04-02 ENCOUNTER — Other Ambulatory Visit (HOSPITAL_BASED_OUTPATIENT_CLINIC_OR_DEPARTMENT_OTHER): Payer: Self-pay

## 2024-04-02 ENCOUNTER — Other Ambulatory Visit: Payer: Self-pay | Admitting: Hematology & Oncology

## 2024-04-02 MED ORDER — HEPARIN NA (PORK) LOCK FLSH PF 100 UNIT/ML IV SOLN
250.0000 [IU] | Freq: Two times a day (BID) | INTRAVENOUS | 3 refills | Status: DC
  Filled 2024-04-02: qty 150, 30d supply, fill #0
  Filled 2024-04-27: qty 300, 30d supply, fill #1
  Filled 2024-05-28: qty 300, 30d supply, fill #2
  Filled 2024-07-02: qty 300, 30d supply, fill #3
  Filled 2024-07-30: qty 150, 30d supply, fill #4

## 2024-04-02 MED ORDER — SODIUM CHLORIDE FLUSH 0.9 % IV SOLN
10.0000 mL | Freq: Two times a day (BID) | INTRAVENOUS | 3 refills | Status: DC
  Filled 2024-04-02: qty 600, 30d supply, fill #0
  Filled 2024-04-27: qty 600, 30d supply, fill #1
  Filled 2024-06-04: qty 600, 30d supply, fill #2
  Filled 2024-07-02: qty 600, 30d supply, fill #3

## 2024-04-04 ENCOUNTER — Other Ambulatory Visit: Payer: Self-pay

## 2024-04-04 ENCOUNTER — Inpatient Hospital Stay

## 2024-04-04 ENCOUNTER — Other Ambulatory Visit (HOSPITAL_COMMUNITY): Payer: Self-pay

## 2024-04-04 VITALS — BP 91/67 | HR 83 | Temp 98.0°F | Resp 16

## 2024-04-04 DIAGNOSIS — Z7982 Long term (current) use of aspirin: Secondary | ICD-10-CM | POA: Diagnosis not present

## 2024-04-04 DIAGNOSIS — C50919 Malignant neoplasm of unspecified site of unspecified female breast: Secondary | ICD-10-CM | POA: Diagnosis not present

## 2024-04-04 DIAGNOSIS — Z79899 Other long term (current) drug therapy: Secondary | ICD-10-CM | POA: Diagnosis not present

## 2024-04-04 DIAGNOSIS — C771 Secondary and unspecified malignant neoplasm of intrathoracic lymph nodes: Secondary | ICD-10-CM | POA: Diagnosis not present

## 2024-04-04 DIAGNOSIS — C7951 Secondary malignant neoplasm of bone: Secondary | ICD-10-CM | POA: Diagnosis not present

## 2024-04-04 DIAGNOSIS — Z1721 Progesterone receptor positive status: Secondary | ICD-10-CM | POA: Diagnosis not present

## 2024-04-04 DIAGNOSIS — Z5111 Encounter for antineoplastic chemotherapy: Secondary | ICD-10-CM | POA: Diagnosis not present

## 2024-04-04 DIAGNOSIS — D649 Anemia, unspecified: Secondary | ICD-10-CM | POA: Diagnosis not present

## 2024-04-04 DIAGNOSIS — Z170 Estrogen receptor positive status [ER+]: Secondary | ICD-10-CM | POA: Diagnosis not present

## 2024-04-04 MED ORDER — HEPARIN SOD (PORK) LOCK FLUSH 100 UNIT/ML IV SOLN
500.0000 [IU] | Freq: Once | INTRAVENOUS | Status: AC
  Administered 2024-04-04: 500 [IU] via INTRAVENOUS

## 2024-04-04 MED ORDER — SODIUM CHLORIDE 0.9% FLUSH
10.0000 mL | INTRAVENOUS | Status: DC | PRN
  Administered 2024-04-04: 10 mL via INTRAVENOUS

## 2024-04-04 NOTE — Patient Instructions (Signed)
 PICC Home Care Guide A peripherally inserted central catheter (PICC) is a form of IV access that allows medicines and IV fluids to be quickly put into the blood and spread throughout the body. The PICC is a long, thin, flexible tube (catheter) that is put into a vein in a person's arm or leg. The catheter ends in a large vein just outside the heart called the superior vena cava (SVC). After the PICC is put in, a chest X-ray may be done to make sure that it is in the right place. A PICC may be placed for different reasons, such as: To give medicines and liquid nutrition. To give IV fluids and blood products. To take blood samples often. If there is trouble placing a peripheral intravenous (PIV) catheter. If cared for properly, a PICC can remain in place for many months. Having a PICC can allow you to go home from the hospital sooner and continue treatment at home. Medicines and PICC care can be managed at home by a family member, caregiver, or home health care team. What are the risks? Generally, having a PICC is safe. However, problems may occur, including: A blood clot (thrombus) forming in or at the end of the PICC. A blood clot forming in a vein (deep vein thrombosis) or traveling to the lung (pulmonary embolism). Inflammation of the vein (phlebitis) in which the PICC is placed. Infection at the insertion site or in the blood. Blood infections from central lines, like PICCs, can be serious and often require a hospital stay. PICC malposition, or PICC movement or poor placement. A break or cut in the PICC. Do not use scissors near the PICC. Nerve or tendon irritation or injury during PICC insertion. How to care for your PICC Please follow the specific guidelines provided by your health care provider. Preventing infection You and any caregivers should wash your hands often with soap and water for at least 20 seconds. Wash hands: Before touching the PICC or the infusion device. Before changing a  bandage (dressing). Do not change the dressing unless you have been taught to do so and have shown you are able to change it safely. Flush the PICC as told. Tell your health care provider right away if the PICC is hard to flush or does not flush. Do not use force to flush the PICC. Use clean and germ-free (sterile) supplies only. Keep the supplies in a dry place. Do not reuse needles, syringes, or any other supplies. Reusing supplies can lead to infection. Keep the PICC dressing dry and secure it with tape if the edges stop sticking to your skin. Check your PICC insertion site every day for signs of infection. Check for: Redness, swelling, or pain. Fluid or blood. Warmth. Pus or a bad smell. Preventing other problems Do not use a syringe that is less than 10 mL to flush the PICC. Do not have your blood pressure checked on the arm in which the PICC is placed. Do not ever pull or tug on the PICC. Keep it secured to your arm with tape or a stretch wrap when not in use. Do not take the PICC out yourself. Only a trained health care provider should remove the PICC. Keep pets and children away from your PICC. How to care for your PICC dressing Keep your PICC dressing clean and dry to prevent infection. Do not take baths, swim, or use a hot tub until your health care provider approves. Ask your health care provider if you can take  showers. You may only be allowed to take sponge baths. When you are allowed to shower: Ask your health care provider to teach you how to wrap the PICC. Cover the PICC with clear plastic wrap and tape to keep it dry while showering. Follow instructions from your health care provider about how to take care of your insertion site and dressing. Make sure you: Wash your hands with soap and water for at least 20 seconds before and after you change your dressing. If soap and water are not available, use hand sanitizer. Change your dressing only if taught to do so by your health care  provider. Your PICC dressing needs to be changed if it becomes loose or wet. Leave stitches (sutures), skin glue, or adhesive strips in place. These skin closures may need to stay in place for 2 weeks or longer. If adhesive strip edges start to loosen and curl up, you may trim the loose edges. Do not remove adhesive strips completely unless your health care provider tells you to do that. Follow these instructions at home: Disposal of supplies Throw away any syringes in a disposal container that is meant for sharp items (sharps container). You can buy a sharps container from a pharmacy, or you can make one by using an empty, hard plastic bottle with a lid. Place any used dressings or infusion bags into a plastic bag. Throw that bag in the trash. General instructions  Always carry your PICC identification card or wear a medical alert bracelet. Keep the tube clamped at all times, unless it is being used. Always carry a smooth-edge clamp with you to clamp the PICC if it breaks. Do not use scissors or sharp objects near the tube. You may bend your arm and move it freely. If your PICC is near or at the bend of your elbow, avoid activity with repeated motion at the elbow. Avoid lifting heavy objects as told by your health care provider. Keep all follow-up visits. This is important. You will need to have your PICC dressing changed at least once a week. Contact a health care provider if: You have pain in your arm, ear, face, or teeth. You have a fever or chills. You have redness, swelling, or pain around the insertion site. You have fluid or blood coming from the insertion site. Your insertion site feels warm to the touch. You have pus or a bad smell coming from the insertion site. Your skin feels hard and raised around the insertion site. Your PICC dressing has gotten wet or is coming off and you have not been taught how to change it. Get help right away if: You have problems with your PICC, such as  your PICC: Was tugged or pulled and has partially come out. Do not  push the PICC back in. Cannot be flushed, is hard to flush, or leaks around the insertion site when it is flushed. Makes a flushing sound when it is flushed. Appears to have a hole or tear. Is accidentally pulled all the way out. If this happens, cover the insertion site with a gauze dressing. Do not throw the PICC away. Your health care provider will need to check it to be sure the entire catheter came out. You feel your heart racing or skipping beats, or you have chest pain. You have shortness of breath or trouble breathing. You have swelling, redness, warmth, or pain in the arm in which the PICC is placed. You have a red streak going up your arm that  starts under the PICC dressing. These symptoms may be an emergency. Get help right away. Call 911. Do not wait to see if the symptoms will go away. Do not drive yourself to the hospital. Summary A peripherally inserted central catheter (PICC) is a long, thin, flexible tube (catheter) that is put into a vein in the arm or leg. If cared for properly, a PICC can remain in place for many months. Having a PICC can allow you to go home from the hospital sooner and continue treatment at home. The PICC is inserted using a germ-free (sterile) technique by a specially trained health care provider. Only a trained health care provider should remove it. Do not have your blood pressure checked on the arm in which your PICC is placed. Always keep your PICC identification card with you. This information is not intended to replace advice given to you by your health care provider. Make sure you discuss any questions you have with your health care provider. Document Revised: 07/01/2021 Document Reviewed: 07/01/2021 Elsevier Patient Education  2024 [employer].

## 2024-04-04 NOTE — Progress Notes (Signed)
 Specialty Pharmacy Refill Coordination Note  Tracy Thompson is a 73 y.o. female contacted today regarding refills of specialty medication(s) Ribociclib Succinate (Kisqali (400 MG Dose))   Patient requested Delivery   Delivery date: 04/11/24   Verified address: Patient address [address]  [name]   Medication will be filled on 04.15.25.

## 2024-04-04 NOTE — Progress Notes (Signed)
 Pt states she has been noticing that her blood pressure has been running low and she has had dizziness with position changes at times. Pt states Dr. Myna Hidalgo had her stop her hydrochlorothiazide at her last appointment. Reviewed pt BP medications with Dr. Myna Hidalgo and pt  current medications along with symptoms. Dr. Myna Hidalgo recommends that patient stop her Norvasc at this time and follow up with her PCP. Staff message sent to Dr. Zola Button and pt instructed to make an appointment to discuss Dr. Myna Hidalgo recommendation of stopping Norvasc as Dr. Zola Button has been managing her BP medications. Pt verbalized understanding and had no further questions.

## 2024-04-10 ENCOUNTER — Encounter: Payer: Self-pay | Admitting: Hematology & Oncology

## 2024-04-11 ENCOUNTER — Inpatient Hospital Stay

## 2024-04-11 VITALS — BP 110/64 | HR 82 | Temp 98.4°F | Resp 18

## 2024-04-11 DIAGNOSIS — Z7982 Long term (current) use of aspirin: Secondary | ICD-10-CM | POA: Diagnosis not present

## 2024-04-11 DIAGNOSIS — C50919 Malignant neoplasm of unspecified site of unspecified female breast: Secondary | ICD-10-CM | POA: Diagnosis not present

## 2024-04-11 DIAGNOSIS — D649 Anemia, unspecified: Secondary | ICD-10-CM | POA: Diagnosis not present

## 2024-04-11 DIAGNOSIS — Z5111 Encounter for antineoplastic chemotherapy: Secondary | ICD-10-CM | POA: Diagnosis not present

## 2024-04-11 DIAGNOSIS — Z1721 Progesterone receptor positive status: Secondary | ICD-10-CM | POA: Diagnosis not present

## 2024-04-11 DIAGNOSIS — C771 Secondary and unspecified malignant neoplasm of intrathoracic lymph nodes: Secondary | ICD-10-CM | POA: Diagnosis not present

## 2024-04-11 DIAGNOSIS — Z79899 Other long term (current) drug therapy: Secondary | ICD-10-CM | POA: Diagnosis not present

## 2024-04-11 DIAGNOSIS — Z95828 Presence of other vascular implants and grafts: Secondary | ICD-10-CM

## 2024-04-11 DIAGNOSIS — Z170 Estrogen receptor positive status [ER+]: Secondary | ICD-10-CM | POA: Diagnosis not present

## 2024-04-11 DIAGNOSIS — C7951 Secondary malignant neoplasm of bone: Secondary | ICD-10-CM | POA: Diagnosis not present

## 2024-04-11 MED ORDER — HEPARIN SOD (PORK) LOCK FLUSH 100 UNIT/ML IV SOLN
500.0000 [IU] | Freq: Once | INTRAVENOUS | Status: AC
  Administered 2024-04-11: 500 [IU] via INTRAVENOUS

## 2024-04-11 MED ORDER — SODIUM CHLORIDE 0.9% FLUSH
10.0000 mL | Freq: Once | INTRAVENOUS | Status: AC
  Administered 2024-04-11: 10 mL via INTRAVENOUS

## 2024-04-11 NOTE — Patient Instructions (Signed)

## 2024-04-13 ENCOUNTER — Ambulatory Visit (HOSPITAL_COMMUNITY)
Admission: RE | Admit: 2024-04-13 | Discharge: 2024-04-13 | Disposition: A | Discharge status: Home / Self Care | Source: Ambulatory Visit | Attending: Hematology & Oncology | Admitting: Hematology & Oncology

## 2024-04-13 DIAGNOSIS — C50919 Malignant neoplasm of unspecified site of unspecified female breast: Secondary | ICD-10-CM | POA: Diagnosis not present

## 2024-04-13 DIAGNOSIS — C7951 Secondary malignant neoplasm of bone: Secondary | ICD-10-CM | POA: Diagnosis not present

## 2024-04-13 LAB — GLUCOSE, CAPILLARY: Glucose-Capillary: 93 mg/dL (ref 70–99)

## 2024-04-13 MED ORDER — FLUDEOXYGLUCOSE F - 18 (FDG) INJECTION
7.8000 | Freq: Once | INTRAVENOUS | Status: AC
  Administered 2024-04-13: 7.8 via INTRAVENOUS

## 2024-04-18 ENCOUNTER — Inpatient Hospital Stay

## 2024-04-18 NOTE — Patient Instructions (Signed)
 PICC Home Care Guide A peripherally inserted central catheter (PICC) is a form of IV access that allows medicines and IV fluids to be quickly put into the blood and spread throughout the body. The PICC is a long, thin, flexible tube (catheter) that is put into a vein in a person's arm or leg. The catheter ends in a large vein just outside the heart called the superior vena cava (SVC). After the PICC is put in, a chest X-ray may be done to make sure that it is in the right place. A PICC may be placed for different reasons, such as: To give medicines and liquid nutrition. To give IV fluids and blood products. To take blood samples often. If there is trouble placing a peripheral intravenous (PIV) catheter. If cared for properly, a PICC can remain in place for many months. Having a PICC can allow you to go home from the hospital sooner and continue treatment at home. Medicines and PICC care can be managed at home by a family member, caregiver, or home health care team. What are the risks? Generally, having a PICC is safe. However, problems may occur, including: A blood clot (thrombus) forming in or at the end of the PICC. A blood clot forming in a vein (deep vein thrombosis) or traveling to the lung (pulmonary embolism). Inflammation of the vein (phlebitis) in which the PICC is placed. Infection at the insertion site or in the blood. Blood infections from central lines, like PICCs, can be serious and often require a hospital stay. PICC malposition, or PICC movement or poor placement. A break or cut in the PICC. Do not use scissors near the PICC. Nerve or tendon irritation or injury during PICC insertion. How to care for your PICC Please follow the specific guidelines provided by your health care provider. Preventing infection You and any caregivers should wash your hands often with soap and water for at least 20 seconds. Wash hands: Before touching the PICC or the infusion device. Before changing a  bandage (dressing). Do not change the dressing unless you have been taught to do so and have shown you are able to change it safely. Flush the PICC as told. Tell your health care provider right away if the PICC is hard to flush or does not flush. Do not use force to flush the PICC. Use clean and germ-free (sterile) supplies only. Keep the supplies in a dry place. Do not reuse needles, syringes, or any other supplies. Reusing supplies can lead to infection. Keep the PICC dressing dry and secure it with tape if the edges stop sticking to your skin. Check your PICC insertion site every day for signs of infection. Check for: Redness, swelling, or pain. Fluid or blood. Warmth. Pus or a bad smell. Preventing other problems Do not use a syringe that is less than 10 mL to flush the PICC. Do not have your blood pressure checked on the arm in which the PICC is placed. Do not ever pull or tug on the PICC. Keep it secured to your arm with tape or a stretch wrap when not in use. Do not take the PICC out yourself. Only a trained health care provider should remove the PICC. Keep pets and children away from your PICC. How to care for your PICC dressing Keep your PICC dressing clean and dry to prevent infection. Do not take baths, swim, or use a hot tub until your health care provider approves. Ask your health care provider if you can take  showers. You may only be allowed to take sponge baths. When you are allowed to shower: Ask your health care provider to teach you how to wrap the PICC. Cover the PICC with clear plastic wrap and tape to keep it dry while showering. Follow instructions from your health care provider about how to take care of your insertion site and dressing. Make sure you: Wash your hands with soap and water for at least 20 seconds before and after you change your dressing. If soap and water are not available, use hand sanitizer. Change your dressing only if taught to do so by your health care  provider. Your PICC dressing needs to be changed if it becomes loose or wet. Leave stitches (sutures), skin glue, or adhesive strips in place. These skin closures may need to stay in place for 2 weeks or longer. If adhesive strip edges start to loosen and curl up, you may trim the loose edges. Do not remove adhesive strips completely unless your health care provider tells you to do that. Follow these instructions at home: Disposal of supplies Throw away any syringes in a disposal container that is meant for sharp items (sharps container). You can buy a sharps container from a pharmacy, or you can make one by using an empty, hard plastic bottle with a lid. Place any used dressings or infusion bags into a plastic bag. Throw that bag in the trash. General instructions  Always carry your PICC identification card or wear a medical alert bracelet. Keep the tube clamped at all times, unless it is being used. Always carry a smooth-edge clamp with you to clamp the PICC if it breaks. Do not use scissors or sharp objects near the tube. You may bend your arm and move it freely. If your PICC is near or at the bend of your elbow, avoid activity with repeated motion at the elbow. Avoid lifting heavy objects as told by your health care provider. Keep all follow-up visits. This is important. You will need to have your PICC dressing changed at least once a week. Contact a health care provider if: You have pain in your arm, ear, face, or teeth. You have a fever or chills. You have redness, swelling, or pain around the insertion site. You have fluid or blood coming from the insertion site. Your insertion site feels warm to the touch. You have pus or a bad smell coming from the insertion site. Your skin feels hard and raised around the insertion site. Your PICC dressing has gotten wet or is coming off and you have not been taught how to change it. Get help right away if: You have problems with your PICC, such as  your PICC: Was tugged or pulled and has partially come out. Do not  push the PICC back in. Cannot be flushed, is hard to flush, or leaks around the insertion site when it is flushed. Makes a flushing sound when it is flushed. Appears to have a hole or tear. Is accidentally pulled all the way out. If this happens, cover the insertion site with a gauze dressing. Do not throw the PICC away. Your health care provider will need to check it to be sure the entire catheter came out. You feel your heart racing or skipping beats, or you have chest pain. You have shortness of breath or trouble breathing. You have swelling, redness, warmth, or pain in the arm in which the PICC is placed. You have a red streak going up your arm that  starts under the PICC dressing. These symptoms may be an emergency. Get help right away. Call 911. Do not wait to see if the symptoms will go away. Do not drive yourself to the hospital. Summary A peripherally inserted central catheter (PICC) is a long, thin, flexible tube (catheter) that is put into a vein in the arm or leg. If cared for properly, a PICC can remain in place for many months. Having a PICC can allow you to go home from the hospital sooner and continue treatment at home. The PICC is inserted using a germ-free (sterile) technique by a specially trained health care provider. Only a trained health care provider should remove it. Do not have your blood pressure checked on the arm in which your PICC is placed. Always keep your PICC identification card with you. This information is not intended to replace advice given to you by your health care provider. Make sure you discuss any questions you have with your health care provider. Document Revised: 07/01/2021 Document Reviewed: 07/01/2021 Elsevier Patient Education  2024 [employer].

## 2024-04-19 ENCOUNTER — Encounter: Payer: Self-pay | Admitting: Hematology & Oncology

## 2024-04-20 ENCOUNTER — Ambulatory Visit: Admitting: Family Medicine

## 2024-04-20 ENCOUNTER — Encounter: Payer: Self-pay | Admitting: Family Medicine

## 2024-04-20 VITALS — BP 100/68 | HR 75 | Temp 97.6°F | Resp 16 | Ht 65.0 in | Wt 156.2 lb

## 2024-04-20 DIAGNOSIS — E559 Vitamin D deficiency, unspecified: Secondary | ICD-10-CM | POA: Diagnosis not present

## 2024-04-20 DIAGNOSIS — Z853 Personal history of malignant neoplasm of breast: Secondary | ICD-10-CM

## 2024-04-20 DIAGNOSIS — I10 Essential (primary) hypertension: Secondary | ICD-10-CM

## 2024-04-20 DIAGNOSIS — E785 Hyperlipidemia, unspecified: Secondary | ICD-10-CM

## 2024-04-20 MED ORDER — FULVESTRANT 250 MG/5ML IM SOSY: 250.0000 mg | PREFILLED_SYRINGE | Freq: Once | INTRAMUSCULAR | Status: AC

## 2024-04-20 MED ORDER — POTASSIUM CHLORIDE CRYS ER 20 MEQ PO TBCR: 20.0000 meq | EXTENDED_RELEASE_TABLET | Freq: Two times a day (BID) | ORAL | 2 refills | Status: DC

## 2024-04-20 NOTE — Patient Instructions (Signed)
 Dizziness Dizziness is a common problem. It makes you feel unsteady or light-headed. You may feel like you're about to faint. Dizziness can lead to getting hurt if you stumble or fall. It's more common to feel dizzy if you're an older adult. Many things can cause you to feel dizzy. These include: Medicines. Dehydration. This is when there's not enough water in your body. Illness. Follow these instructions at home: Eating and drinking  Drink enough fluid to keep your pee (urine) pale yellow. This helps keep you from getting dehydrated. Try to drink more clear fluids, such as water. Do not drink alcohol. Try to limit how much caffeine you take in. Try to limit how much salt, also called sodium, you take in. Activity Try not to make quick movements. Stand up slowly from sitting in a chair. Steady yourself until you feel okay. In the morning, first sit up on the side of the bed. When you feel okay, hold onto something and slowly stand up. Do this until you know that your balance is okay. If you need to stand in one place for a long time, move your legs often. Tighten and relax the muscles in your legs while you're standing. Do not drive or use machines if you feel dizzy. Avoid bending down if you feel dizzy. Place items in your home so you can reach them without leaning over. Lifestyle Do not smoke, vape, or use products with nicotine or tobacco in them. If you need help quitting, talk with your health care provider. Try to lower your stress level. You can do this by using methods like yoga or meditation. Talk with your provider if you need help. General instructions Watch your dizziness for any changes. Take your medicines only as told by your provider. Talk with your provider if you think you're dizzy because of a medicine you're taking. Tell a friend or a family member that you're feeling dizzy. If they spot any changes in your behavior, have them call your provider. Contact a health care  provider if: Your dizziness doesn't go away, or you have new symptoms. Your dizziness gets worse. You feel like you may vomit. You have trouble hearing. You have a fever. You have neck pain or a stiff neck. You fall or get hurt. Get help right away if: You vomit each time you eat or drink. You have watery poop and can't eat or drink. You have trouble talking, walking, swallowing, or using your arms, hands, or legs. You feel very weak. You're bleeding. You're not thinking clearly, or you have trouble forming sentences. A friend or family member may spot this. Your vision changes, or you get a very bad headache. These symptoms may be an emergency. Call 911 right away. Do not wait to see if the symptoms will go away. Do not drive yourself to the hospital. This information is not intended to replace advice given to you by your health care provider. Make sure you discuss any questions you have with your health care provider. Document Revised: 09/15/2023 Document Reviewed: 01/27/2023 Elsevier Patient Education  2024 [employer].

## 2024-04-20 NOTE — Progress Notes (Signed)
 Established Patient Office Visit  Subjective   Patient ID: Tracy Thompson, female    DOB: September 11, 1951  Age: 73 y.o. MRN: [id number]  Chief Complaint  Patient presents with   Hypertension   Follow-up    HPI Discussed the use of AI scribe software for clinical note transcription with the patient, who gave verbal consent to proceed.  History of Present Illness Tracy Thompson "Tracy Thompson" is a 73 year old female with hypertension and cancer who presents with dizziness and falls.  She experiences dizziness, particularly when transitioning from sitting to standing, which has led to five falls over the past year and a half. One fall resulted in a fracture of three ribs, discovered incidentally on a PET scan. The dizziness is distinct from vertigo, as it lacks the spinning sensation typical of vertigo. She uses a walker at home to prevent falls, especially on hardwood floors.  She is currently taking amlodipine  and hydrochlorothiazide  for hypertension. Her blood pressure has been low. She has lost 50 pounds since last fall, which she attributes to a loss of appetite beginning in the summer and subsequent radiation treatment.  She is undergoing treatment for cancer, which includes Zyprexa  twice daily, fulvestrant  injections in each hip monthly, and an intravenous drip every three months. She has a PICC line that is flushed and changed weekly, and she receives a shot in the hip during her monthly appointments. She recently had a PET scan and is awaiting results. She mentions being in remission as per the last scan.  She had a mastectomy for a growth that was initially suspected to be cancerous but was not. She regrets not having a bilateral mastectomy as the growth was significant and protruded through the skin.  She experiences weakness and fatigue, which she attributes to her ongoing medication regimen. She mentions that she will be on this medication for the rest of her life.   Patient Active  Problem List   Diagnosis Date Noted   S/P mastectomy, right 04/12/2023   Genetic testing 11/24/2022   Monoallelic mutation of CHEK2 gene in female patient 11/24/2022   Breast cancer metastasized to bone (HCC) 09/29/2022   Preventative health care 09/15/2021   Vitamin D  deficiency 09/15/2021   Hyperglycemia 09/15/2021   Bilateral impacted cerumen 08/17/2021   Cellulitis of right arm 02/11/2017   GERD - clinical dx only based on response to RX  10/31/2016   Dyspnea 09/17/2016   Abnormal CXR 09/17/2016   Lymphedema 08/23/2014   Morbid obesity due to excess calories (HCC) 04/17/2014   Diverticulosis of colon without hemorrhage 04/16/2013   Internal hemorrhoid, bleeding 04/16/2013   Factor 5 Leiden mutation, heterozygous (HCC) 02/11/2012   Varicose veins 09/20/2011   Hyperlipidemia 07/17/2010   BENIGN NEOPLASM OF SKIN OF TRUNK EXCEPT SCROTUM 01/15/2010   SKIN TAG 01/15/2010   DYSPNEA ON EXERTION 07/16/2009   CHEST PAIN, EXERTIONAL 07/16/2009   SINUSITIS- ACUTE-NOS 11/01/2008   BENIGN POSITIONAL VERTIGO 10/18/2008   HYPOPOTASSEMIA 05/17/2008   LYMPHEDEMA, RIGHT ARM 05/02/2008   CELLULITIS/ABSCESS, ARM 05/02/2008   Essential hypertension 09/14/2007   POSTMENOPAUSAL STATUS 09/14/2007   SYMPTOM, DYSURIA 09/14/2007   History of breast cancer 09/14/2007   Past Medical History:  Diagnosis Date   Breast cancer (HCC) 1994   s/p chemo 8/94-1/95, radiation 8/94-11/94   Breast cancer metastasized to bone (HCC) 09/29/2022   Cataracts, bilateral    Dyspnea    Factor 5 Leiden mutation, heterozygous (HCC)    also has Factor 8 problems  Headache    younger 67   History of radiation therapy    Lumbar Spine, Left hip- 11/03/22-11/17/22- Dr. 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   Low penetrance CHEK2 c.470T>C (p.Ile157Thr)    Past Surgical History:  Procedure Laterality  Date   BREAST LUMPECTOMY Right 1994   w/ radiation and chemotherapy following lumpectomy   BRONCHIAL BIOPSY  11/10/2022   Procedure: BRONCHIAL BIOPSIES;  Surgeon: Marine Sia, MD;  Location: [facility] ENDOSCOPY;  Service: Cardiopulmonary;;   BRONCHIAL BRUSHINGS  11/10/2022   Procedure: BRONCHIAL BRUSHINGS;  Surgeon: Marine Sia, MD;  Location: [facility] ENDOSCOPY;  Service: Cardiopulmonary;;   BRONCHIAL WASHINGS  11/10/2022   Procedure: BRONCHIAL WASHINGS;  Surgeon: Marine Sia, MD;  Location: [facility] ENDOSCOPY;  Service: Cardiopulmonary;;   CHOLECYSTECTOMY  06/20/2002   Laparoscopic cholestectomy   IR FLUORO GUIDE CV LINE LEFT  07/08/2023   IR IMAGING GUIDED PORT INSERTION  07/08/2023   IR US  GUIDE VASC ACCESS LEFT  07/08/2023   IR VENO/EXT/UNI LEFT  07/08/2023   IR VENO/JUGULAR LEFT  07/08/2023   TOTAL MASTECTOMY Right 04/12/2023   Procedure: RIGHT TOTAL MASTECTOMY;  Surgeon: Enid Harry, MD;  Location: [facility] OR;  Service: General;  Laterality: Right;   varicose veins Bilateral 2013   laser surgery   Dr. Enedina Harrow   VIDEO BRONCHOSCOPY WITH ENDOBRONCHIAL ULTRASOUND N/A 11/10/2022   Procedure: VIDEO BRONCHOSCOPY WITH ENDOBRONCHIAL ULTRASOUND;  Surgeon: Marine Sia, MD;  Location: [facility] ENDOSCOPY;  Service: Cardiopulmonary;  Laterality: N/A;   Social History   Tobacco Use   Smoking status: Never   Smokeless tobacco: Never  Vaping Use   Vaping status: Never Used  Substance Use Topics   Alcohol use: No   Drug use: No   Social History   Socioeconomic History   Marital status: Married    Spouse name: JOVANNI ECKHART   Number of children: Not on file   Years of education: Not on file   Highest education level: Not on file  Occupational History   Occupation: retired Magazine features editor: RETIRED  Tobacco Use   Smoking status: Never   Smokeless tobacco: Never  Vaping Use   Vaping status: Never Used  Substance and Sexual Activity   Alcohol use: No   Drug use: No   Sexual  activity: Not Currently    Partners: Male  Other Topics Concern   Not on file  Social History Narrative   Exercise---  no ,    Social Drivers of Health   Financial Resource Strain: Low Risk  (07/20/2021)   Overall Financial Resource Strain (CARDIA)    Difficulty of Paying Living Expenses: Not hard at all  Food Insecurity: No Food Insecurity (04/12/2023)   Hunger Vital Sign    Worried About Running Out of Food in the Last Year: Never true    Ran Out of Food in the Last Year: Never true  Transportation Needs: No Transportation Needs (04/12/2023)   PRAPARE - Administrator, Civil Service (Medical): No    Lack of Transportation (Non-Medical): No  Physical Activity: Sufficiently Active (07/20/2021)   Exercise Vital Sign    Days of Exercise per Week: 7 days    Minutes of Exercise per Session: 30 min  Stress: No Stress Concern Present (07/20/2021)   [employer] of Occupational Health -  Occupational Stress Questionnaire    Feeling of Stress : Not at all  Social Connections: Moderately Isolated (07/20/2021)   Social Connection and Isolation Panel [NHANES]    Frequency of Communication with Friends and Family: More than three times a week    Frequency of Social Gatherings with Friends and Family: More than three times a week    Attends Religious Services: Never    Database administrator or Organizations: No    Attends Banker Meetings: Never    Marital Status: Married  Catering manager Violence: Not At Risk (07/20/2021)   Humiliation, Afraid, Rape, and Kick questionnaire    Fear of Current or Ex-Partner: No    Emotionally Abused: No    Physically Abused: No    Sexually Abused: No   Family Status  Relation Name Status   Mother  Deceased at age 48       FTT and dementia, copd   Father  Deceased at age 38       stroke   Brother  Deceased at age 34       copd   Cousin  Deceased       covid    Other  (Not Specified)  No partnership data on file   Family  History  Problem Relation Age of Onset   Colon cancer Mother 41 - 55   Stroke Mother    Hypertension Mother    Hyperlipidemia Mother    Hepatitis Mother        hep A   COPD Mother    Dementia Mother    Bladder Cancer Mother 70   Hypertension Father    Prostate cancer Father    Diabetes Father    Stroke Father    Pulmonary embolism Father    Deep vein thrombosis Father        PE   Schizophrenia Brother    Mental illness Brother    COPD Brother    Hepatitis C Brother    Liver disease Other    No Known Allergies    Review of Systems  Constitutional:  Negative for chills, fever and malaise/fatigue.  HENT:  Negative for congestion and hearing loss.   Eyes:  Negative for discharge.  Respiratory:  Negative for cough, sputum production and shortness of breath.   Cardiovascular:  Negative for chest pain, palpitations and leg swelling.  Gastrointestinal:  Negative for abdominal pain, blood in stool, constipation, diarrhea, heartburn, nausea and vomiting.  Genitourinary:  Negative for dysuria, frequency, hematuria and urgency.  Musculoskeletal:  Negative for back pain, falls and myalgias.  Skin:  Negative for rash.  Neurological:  Negative for dizziness, sensory change, loss of consciousness, weakness and headaches.  Endo/Heme/Allergies:  Negative for environmental allergies. Does not bruise/bleed easily.  Psychiatric/Behavioral:  Negative for depression and suicidal ideas. The patient is not nervous/anxious and does not have insomnia.       Objective:     BP 100/68 (BP Location: Left Arm, Patient Position: Sitting, Cuff Size: Normal)   Pulse 75   Temp 97.6 F (36.4 C) (Oral)   Resp 16   Ht 5\' 5"  (1.651 m)   Wt 156 lb 3.2 oz (70.9 kg)   SpO2 95%   BMI 25.99 kg/m  BP Readings from Last 3 Encounters:  04/20/24 100/68  04/18/24 (!) 147/63  04/11/24 110/64   Wt Readings from Last 3 Encounters:  04/20/24 156 lb 3.2 oz (70.9 kg)  03/21/24 156 lb (70.8 kg)  02/22/24 156  lb (70.8 kg)   SpO2 Readings from Last 3 Encounters:  04/20/24 95%  04/18/24 100%  04/11/24 98%      Physical Exam Vitals and nursing note reviewed.  Constitutional:      General: She is not in acute distress.    Appearance: Normal appearance. She is well-developed.  HENT:     Head: Normocephalic and atraumatic.  Eyes:     General: No scleral icterus.       Right eye: No discharge.        Left eye: No discharge.  Cardiovascular:     Rate and Rhythm: Normal rate and regular rhythm.     Heart sounds: No murmur heard. Pulmonary:     Effort: Pulmonary effort is normal. No respiratory distress.     Breath sounds: Normal breath sounds.  Musculoskeletal:        General: Normal range of motion.     Cervical back: Normal range of motion and neck supple.     Right lower leg: No edema.     Left lower leg: No edema.  Skin:    General: Skin is warm and dry.  Neurological:     Mental Status: She is alert and oriented to person, place, and time.  Psychiatric:        Mood and Affect: Mood normal.        Behavior: Behavior normal.        Thought Content: Thought content normal.        Judgment: Judgment normal.      No results found for any visits on 04/20/24.  Last CBC Lab Results  Component Value Date   WBC 2.7 (L) 03/21/2024   HGB 10.2 (L) 03/21/2024   HCT 29.0 (L) 03/21/2024   MCV 111.5 (H) 03/21/2024   MCH 39.2 (H) 03/21/2024   RDW 13.1 03/21/2024   PLT 190 03/21/2024   Last metabolic panel Lab Results  Component Value Date   GLUCOSE 96 03/21/2024   NA 141 03/21/2024   K 4.0 03/21/2024   CL 105 03/21/2024   CO2 30 03/21/2024   BUN 21 03/21/2024   CREATININE 1.33 (H) 03/21/2024   GFRNONAA 43 (L) 03/21/2024   CALCIUM  8.9 03/21/2024   PROT 6.0 (L) 03/21/2024   ALBUMIN 3.7 03/21/2024   BILITOT 0.3 03/21/2024   ALKPHOS 87 03/21/2024   AST 31 03/21/2024   ALT 24 03/21/2024   ANIONGAP 6 03/21/2024   Last lipids Lab Results  Component Value Date   CHOL 207  (H) 10/12/2023   HDL 48 10/12/2023   LDLCALC 140 (H) 10/12/2023   LDLDIRECT 135.7 02/11/2012   TRIG 104 10/12/2023   CHOLHDL 3 03/11/2023   Last hemoglobin A1c Lab Results  Component Value Date   HGBA1C 6.4 09/15/2021   Last thyroid  functions Lab Results  Component Value Date   TSH 3.720 10/12/2023   Last vitamin D  Lab Results  Component Value Date   VD25OH 49.4 10/12/2023   Last vitamin B12 and Folate Lab Results  Component Value Date   VITAMINB12 >1500 (H) 09/21/2022      The 10-year ASCVD risk score (Arnett DK, et al., 2019) is: 9%    Assessment & Plan:   Problem List Items Addressed This Visit       Unprioritized   Vitamin D  deficiency   Hyperlipidemia   Relevant Orders   CBC with Differential/Platelet   Comprehensive metabolic panel with GFR   Lipid panel   TSH   Essential hypertension -  Primary   Relevant Medications   potassium chloride  SA (KLOR-CON  M) 20 MEQ tablet   Other Relevant Orders   CBC with Differential/Platelet   Comprehensive metabolic panel with GFR   Lipid panel   TSH   History of breast cancer   Relevant Medications   fulvestrant  (FASLODEX ) 250 MG/5ML injection  Assessment and Plan Assessment & Plan Dizziness due to low blood pressure   Dizziness is likely caused by hypotension, possibly worsened by amlodipine . She has experienced multiple falls, including one with rib fractures. Recent weight loss has contributed to hypotension, reducing the need for antihypertensive medication. Discontinuing amlodipine  should alleviate dizziness and prevent further falls. Monitor blood pressure regularly. Advise avoiding rapid position changes to prevent dizziness. Use a walker for stability on hardwood floors.  Weight loss   She has lost 50 pounds, primarily due to decreased appetite and radiation therapy. Her weight has stabilized at 155 pounds.  Rib fractures   Rib fractures are secondary to falls. No new fractures have been reported  recently.  Breast cancer in remission   Breast cancer is in remission. She is undergoing regular treatment with Zyprexa , fulvestrant  injections, and intravenous Zometa . Recent PET scan results are pending, with a follow-up appointment scheduled for further evaluation. Continue the current cancer treatment regimen. Attend the follow-up appointment for PET scan results. Coordinate with the oncology team for blood work during regular appointments.  General Health Maintenance   She uses a PICC line for medication administration, requiring regular maintenance and flushing. Continue weekly PICC line maintenance and flushing. Ensure an adequate supply of saline and heparin  for home use.    Return in about 3 months (around 07/20/2024).    Davyd Podgorski R Lowne Chase, DO

## 2024-04-25 ENCOUNTER — Other Ambulatory Visit: Payer: Self-pay | Admitting: *Deleted

## 2024-04-25 ENCOUNTER — Inpatient Hospital Stay

## 2024-04-25 ENCOUNTER — Encounter: Payer: Self-pay | Admitting: Hematology & Oncology

## 2024-04-25 ENCOUNTER — Inpatient Hospital Stay: Admitting: Hematology & Oncology

## 2024-04-25 VITALS — BP 118/61 | HR 73 | Temp 97.3°F | Resp 18 | Ht 66.0 in | Wt 155.5 lb

## 2024-04-25 DIAGNOSIS — C7951 Secondary malignant neoplasm of bone: Secondary | ICD-10-CM | POA: Diagnosis not present

## 2024-04-25 DIAGNOSIS — D500 Iron deficiency anemia secondary to blood loss (chronic): Secondary | ICD-10-CM

## 2024-04-25 DIAGNOSIS — C771 Secondary and unspecified malignant neoplasm of intrathoracic lymph nodes: Secondary | ICD-10-CM | POA: Diagnosis not present

## 2024-04-25 DIAGNOSIS — C50919 Malignant neoplasm of unspecified site of unspecified female breast: Secondary | ICD-10-CM

## 2024-04-25 DIAGNOSIS — Z853 Personal history of malignant neoplasm of breast: Secondary | ICD-10-CM

## 2024-04-25 DIAGNOSIS — Z0000 Encounter for general adult medical examination without abnormal findings: Secondary | ICD-10-CM

## 2024-04-25 DIAGNOSIS — Z7982 Long term (current) use of aspirin: Secondary | ICD-10-CM | POA: Diagnosis not present

## 2024-04-25 DIAGNOSIS — Z5111 Encounter for antineoplastic chemotherapy: Secondary | ICD-10-CM | POA: Diagnosis not present

## 2024-04-25 DIAGNOSIS — E785 Hyperlipidemia, unspecified: Secondary | ICD-10-CM

## 2024-04-25 DIAGNOSIS — Z170 Estrogen receptor positive status [ER+]: Secondary | ICD-10-CM | POA: Diagnosis not present

## 2024-04-25 DIAGNOSIS — Z95828 Presence of other vascular implants and grafts: Secondary | ICD-10-CM

## 2024-04-25 DIAGNOSIS — Z79899 Other long term (current) drug therapy: Secondary | ICD-10-CM | POA: Diagnosis not present

## 2024-04-25 DIAGNOSIS — Z1721 Progesterone receptor positive status: Secondary | ICD-10-CM | POA: Diagnosis not present

## 2024-04-25 DIAGNOSIS — I10 Essential (primary) hypertension: Secondary | ICD-10-CM

## 2024-04-25 DIAGNOSIS — D649 Anemia, unspecified: Secondary | ICD-10-CM | POA: Diagnosis not present

## 2024-04-25 LAB — LIPID PANEL
Cholesterol: 195 mg/dL (ref 0–200)
HDL: 48 mg/dL (ref >40)
LDL Cholesterol: 118 mg/dL — ABNORMAL HIGH (ref 0–99)
Total CHOL/HDL Ratio: 4.1 ratio
Triglycerides: 146 mg/dL (ref <150)
VLDL: 29 mg/dL (ref 0–40)

## 2024-04-25 LAB — CMP (CANCER CENTER ONLY)
ALT: 15 U/L (ref 0–44)
AST: 23 U/L (ref 15–41)
Albumin: 3.4 g/dL — ABNORMAL LOW (ref 3.5–5.0)
Alkaline Phosphatase: 65 U/L (ref 38–126)
Anion gap: 5 (ref 5–15)
BUN: 18 mg/dL (ref 8–23)
CO2: 31 mmol/L (ref 22–32)
Calcium: 8.8 mg/dL — ABNORMAL LOW (ref 8.9–10.3)
Chloride: 104 mmol/L (ref 98–111)
Creatinine: 1.38 mg/dL — ABNORMAL HIGH (ref 0.44–1.00)
GFR, Estimated: 41 mL/min — ABNORMAL LOW (ref >60)
Glucose, Bld: 120 mg/dL — ABNORMAL HIGH (ref 70–99)
Potassium: 3.7 mmol/L (ref 3.5–5.1)
Sodium: 140 mmol/L (ref 135–145)
Total Bilirubin: 0.3 mg/dL (ref 0.0–1.2)
Total Protein: 6 g/dL — ABNORMAL LOW (ref 6.5–8.1)

## 2024-04-25 LAB — CBC WITH DIFFERENTIAL (CANCER CENTER ONLY)
Abs Immature Granulocytes: 0.02 10*3/uL (ref 0.00–0.07)
Basophils Absolute: 0 10*3/uL (ref 0.0–0.1)
Basophils Relative: 1 %
Eosinophils Absolute: 0 10*3/uL (ref 0.0–0.5)
Eosinophils Relative: 1 %
HCT: 28.6 % — ABNORMAL LOW (ref 36.0–46.0)
Hemoglobin: 9.9 g/dL — ABNORMAL LOW (ref 12.0–15.0)
Immature Granulocytes: 1 %
Lymphocytes Relative: 13 %
Lymphs Abs: 0.4 10*3/uL — ABNORMAL LOW (ref 0.7–4.0)
MCH: 37.8 pg — ABNORMAL HIGH (ref 26.0–34.0)
MCHC: 34.6 g/dL (ref 30.0–36.0)
MCV: 109.2 fL — ABNORMAL HIGH (ref 80.0–100.0)
Monocytes Absolute: 0.3 10*3/uL (ref 0.1–1.0)
Monocytes Relative: 10 %
Neutro Abs: 2.1 10*3/uL (ref 1.7–7.7)
Neutrophils Relative %: 74 %
Platelet Count: 257 10*3/uL (ref 150–400)
RBC: 2.62 MIL/uL — ABNORMAL LOW (ref 3.87–5.11)
RDW: 12.4 % (ref 11.5–15.5)
WBC Count: 2.8 10*3/uL — ABNORMAL LOW (ref 4.0–10.5)
nRBC: 0 % (ref 0.0–0.2)

## 2024-04-25 LAB — LACTATE DEHYDROGENASE: LDH: 178 U/L (ref 98–192)

## 2024-04-25 LAB — TSH: TSH: 4.08 u[IU]/mL (ref 0.350–4.500)

## 2024-04-25 MED ORDER — HEPARIN SOD (PORK) LOCK FLUSH 100 UNIT/ML IV SOLN: 500.0000 [IU] | Freq: Once | INTRAVENOUS | Status: DC

## 2024-04-25 MED ORDER — FULVESTRANT 250 MG/5ML IM SOSY
500.0000 mg | PREFILLED_SYRINGE | Freq: Once | INTRAMUSCULAR | Status: AC
  Administered 2024-04-25: 500 mg via INTRAMUSCULAR
  Filled 2024-04-25: qty 10

## 2024-04-25 MED ORDER — SODIUM CHLORIDE 0.9% FLUSH: 10.0000 mL | INTRAVENOUS | Status: DC | PRN

## 2024-04-25 NOTE — Addendum Note (Signed)
 Addended by: Dov Dill E on: 04/25/2024 12:06 PM   Modules accepted: Orders

## 2024-04-25 NOTE — Patient Instructions (Signed)
 PICC Home Care Guide A peripherally inserted central catheter (PICC) is a form of IV access that allows medicines and IV fluids to be quickly put into the blood and spread throughout the body. The PICC is a long, thin, flexible tube (catheter) that is put into a vein in a person's arm or leg. The catheter ends in a large vein just outside the heart called the superior vena cava (SVC). After the PICC is put in, a chest X-ray may be done to make sure that it is in the right place. A PICC may be placed for different reasons, such as: To give medicines and liquid nutrition. To give IV fluids and blood products. To take blood samples often. If there is trouble placing a peripheral intravenous (PIV) catheter. If cared for properly, a PICC can remain in place for many months. Having a PICC can allow you to go home from the hospital sooner and continue treatment at home. Medicines and PICC care can be managed at home by a family member, caregiver, or home health care team. What are the risks? Generally, having a PICC is safe. However, problems may occur, including: A blood clot (thrombus) forming in or at the end of the PICC. A blood clot forming in a vein (deep vein thrombosis) or traveling to the lung (pulmonary embolism). Inflammation of the vein (phlebitis) in which the PICC is placed. Infection at the insertion site or in the blood. Blood infections from central lines, like PICCs, can be serious and often require a hospital stay. PICC malposition, or PICC movement or poor placement. A break or cut in the PICC. Do not use scissors near the PICC. Nerve or tendon irritation or injury during PICC insertion. How to care for your PICC Please follow the specific guidelines provided by your health care provider. Preventing infection You and any caregivers should wash your hands often with soap and water for at least 20 seconds. Wash hands: Before touching the PICC or the infusion device. Before changing a  bandage (dressing). Do not change the dressing unless you have been taught to do so and have shown you are able to change it safely. Flush the PICC as told. Tell your health care provider right away if the PICC is hard to flush or does not flush. Do not use force to flush the PICC. Use clean and germ-free (sterile) supplies only. Keep the supplies in a dry place. Do not reuse needles, syringes, or any other supplies. Reusing supplies can lead to infection. Keep the PICC dressing dry and secure it with tape if the edges stop sticking to your skin. Check your PICC insertion site every day for signs of infection. Check for: Redness, swelling, or pain. Fluid or blood. Warmth. Pus or a bad smell. Preventing other problems Do not use a syringe that is less than 10 mL to flush the PICC. Do not have your blood pressure checked on the arm in which the PICC is placed. Do not ever pull or tug on the PICC. Keep it secured to your arm with tape or a stretch wrap when not in use. Do not take the PICC out yourself. Only a trained health care provider should remove the PICC. Keep pets and children away from your PICC. How to care for your PICC dressing Keep your PICC dressing clean and dry to prevent infection. Do not take baths, swim, or use a hot tub until your health care provider approves. Ask your health care provider if you can take  showers. You may only be allowed to take sponge baths. When you are allowed to shower: Ask your health care provider to teach you how to wrap the PICC. Cover the PICC with clear plastic wrap and tape to keep it dry while showering. Follow instructions from your health care provider about how to take care of your insertion site and dressing. Make sure you: Wash your hands with soap and water for at least 20 seconds before and after you change your dressing. If soap and water are not available, use hand sanitizer. Change your dressing only if taught to do so by your health care  provider. Your PICC dressing needs to be changed if it becomes loose or wet. Leave stitches (sutures), skin glue, or adhesive strips in place. These skin closures may need to stay in place for 2 weeks or longer. If adhesive strip edges start to loosen and curl up, you may trim the loose edges. Do not remove adhesive strips completely unless your health care provider tells you to do that. Follow these instructions at home: Disposal of supplies Throw away any syringes in a disposal container that is meant for sharp items (sharps container). You can buy a sharps container from a pharmacy, or you can make one by using an empty, hard plastic bottle with a lid. Place any used dressings or infusion bags into a plastic bag. Throw that bag in the trash. General instructions  Always carry your PICC identification card or wear a medical alert bracelet. Keep the tube clamped at all times, unless it is being used. Always carry a smooth-edge clamp with you to clamp the PICC if it breaks. Do not use scissors or sharp objects near the tube. You may bend your arm and move it freely. If your PICC is near or at the bend of your elbow, avoid activity with repeated motion at the elbow. Avoid lifting heavy objects as told by your health care provider. Keep all follow-up visits. This is important. You will need to have your PICC dressing changed at least once a week. Contact a health care provider if: You have pain in your arm, ear, face, or teeth. You have a fever or chills. You have redness, swelling, or pain around the insertion site. You have fluid or blood coming from the insertion site. Your insertion site feels warm to the touch. You have pus or a bad smell coming from the insertion site. Your skin feels hard and raised around the insertion site. Your PICC dressing has gotten wet or is coming off and you have not been taught how to change it. Get help right away if: You have problems with your PICC, such as  your PICC: Was tugged or pulled and has partially come out. Do not  push the PICC back in. Cannot be flushed, is hard to flush, or leaks around the insertion site when it is flushed. Makes a flushing sound when it is flushed. Appears to have a hole or tear. Is accidentally pulled all the way out. If this happens, cover the insertion site with a gauze dressing. Do not throw the PICC away. Your health care provider will need to check it to be sure the entire catheter came out. You feel your heart racing or skipping beats, or you have chest pain. You have shortness of breath or trouble breathing. You have swelling, redness, warmth, or pain in the arm in which the PICC is placed. You have a red streak going up your arm that  starts under the PICC dressing. These symptoms may be an emergency. Get help right away. Call 911. Do not wait to see if the symptoms will go away. Do not drive yourself to the hospital. Summary A peripherally inserted central catheter (PICC) is a long, thin, flexible tube (catheter) that is put into a vein in the arm or leg. If cared for properly, a PICC can remain in place for many months. Having a PICC can allow you to go home from the hospital sooner and continue treatment at home. The PICC is inserted using a germ-free (sterile) technique by a specially trained health care provider. Only a trained health care provider should remove it. Do not have your blood pressure checked on the arm in which your PICC is placed. Always keep your PICC identification card with you. This information is not intended to replace advice given to you by your health care provider. Make sure you discuss any questions you have with your health care provider. Document Revised: 07/01/2021 Document Reviewed: 07/01/2021 Elsevier Patient Education  2024 [employer].

## 2024-04-25 NOTE — Patient Instructions (Signed)
 Fulvestrant Injection What is this medication? FULVESTRANT (ful VES trant) treats breast cancer. It works by blocking the hormone estrogen in breast tissue, which prevents breast cancer cells from spreading or growing. This medicine may be used for other purposes; ask your health care provider or pharmacist if you have questions. COMMON BRAND NAME(S): FASLODEX What should I tell my care team before I take this medication? They need to know if you have any of these conditions: Bleeding disorder Liver disease Low blood cell levels (white cells, red cells, and platelets) An unusual or allergic reaction to fulvestrant, other medications, foods, dyes, or preservatives Pregnant or trying to get pregnant Breastfeeding How should I use this medication? This medication is injected into a muscle. It is given by your care team in a hospital or clinic setting. Talk to your care team about the use of this medication in children. Special care may be needed. Overdosage: If you think you have taken too much of this medicine contact a poison control center or emergency room at once. NOTE: This medicine is only for you. Do not share this medicine with others. What if I miss a dose? Keep appointments for follow-up doses. It is important not to miss your dose. Call your care team if you are unable to keep an appointment. What may interact with this medication? Fluoroestradiol F18 This list may not describe all possible interactions. Give your health care provider a list of all the medicines, herbs, non-prescription drugs, or dietary supplements you use. Also tell them if you smoke, drink alcohol, or use illegal drugs. Some items may interact with your medicine. What should I watch for while using this medication? Your condition will be monitored carefully while you are receiving this medication. You may need blood work done while you are taking this medication. This medication is injected into a muscle. Talk  to your care team if you also take medications that prevent or treat blood clots, such as warfarin. Blood thinners may increase the risk of bleeding or bruising in the muscle where this medication is injected. The benefits of this medication may outweigh the risks. Your care team can help you find the option that works for you. They can also help limit the risk of bleeding. Talk to your care team if you may be pregnant. Serious birth defects can occur if you take this medication during pregnancy and for 1 year after the last dose. You will need a negative pregnancy test before starting this medication. Contraception is recommended while taking this medication and for 1 year after the last dose. Your care team can help you find the option that works for you. Do not breastfeed while taking this medication and for 1 year after the last dose. This medication may cause infertility. Talk to your care team if you are concerned about your fertility. What side effects may I notice from receiving this medication? Side effects that you should report to your care team as soon as possible: Allergic reactions or angioedema--skin rash, itching or hives, swelling of the face, eyes, lips, tongue, arms, or legs, trouble swallowing or breathing Pain, tingling, or numbness in the hands or feet Side effects that usually do not require medical attention (report to your care team if they continue or are bothersome): Bone, joint, or muscle pain Constipation Headache Hot flashes Nausea Pain, redness, or irritation at injection site Unusual weakness or fatigue This list may not describe all possible side effects. Call your doctor for medical advice about side  effects. You may report side effects to FDA at 1-800-FDA-1088. Where should I keep my medication? This medication is given in a hospital or clinic. It will not be stored at home. NOTE: This sheet is a summary. It may not cover all possible information. If you have  questions about this medicine, talk to your doctor, pharmacist, or health care provider.  2024 Elsevier/Gold Standard (2023-08-19 00:00:00)

## 2024-04-25 NOTE — Progress Notes (Signed)
 Hematology and Oncology Follow Up Visit  Tracy Thompson [id number] 07/13/1951 73 y.o. 04/25/2024   Principle Diagnosis:  Metastatic breast cancer-bone metastasis/hilar lymph node metastasis- ER+/PR+/HER-2 (2+) -- PIK3CA (+)  Current Therapy:   Faslodex  500 mg IM monthly -start on 11/09/2022 Ribociclib  600 mg p.o. daily (21/7) --start on 11/09/2022 --changed to 400 mg p.o. daily started on 09/05/2023 Zometa  4 mg IV every 3 months - next dose 04/2024 Radiation therapy to left hip -completed in 11/23/2022     Interim History:  Tracy Thompson is back for follow-up.  She had a PET scan that was done on 04/13/2024.  The PET scan looked great.  There is no evidence of active breast cancer.  She had sclerotic bone mets which were not metabolic.  Her last CA 27.29 was stable at 31.  She feels okay.  She was had no complaints.  She is a little bit more anemic.  I am going to send off some anemia studies on her and we will see if she has some that we can correct.  She has had no problems with nausea or vomiting.  There is been no change in bowel or bladder habits.  She has had no cough.  She has had no rashes.  She has some chronic leg swelling.  Her left leg is more swollen than the right leg.  She has had no bleeding.  Again, there is no change in bowel or bladder habits.  She has had no headache.  Overall, I would say that her performance status probably ECOG 1.    Wt Readings from Last 3 Encounters:  04/25/24 155 lb 8 oz (70.5 kg)  04/20/24 156 lb 3.2 oz (70.9 kg)  03/21/24 156 lb (70.8 kg)     Medications:  Current Outpatient Medications:    aspirin 81 MG tablet, Take 81 mg by mouth daily., Disp: , Rfl:    bisoprolol -hydrochlorothiazide  (ZIAC ) 10-6.25 MG tablet, Take 1 tablet by mouth daily., Disp: 90 tablet, Rfl: 1   carboxymethylcellulose (REFRESH PLUS) 0.5 % SOLN, Place 1 drop into both eyes 3 (three) times daily as needed., Disp: , Rfl:    Cholecalciferol (VITAMIN D ) 50 MCG (2000 UT)  CAPS, Take 2,000 Units by mouth daily., Disp: , Rfl:    Heparin  Na, Pork, Lock Flsh PF 100 UNIT/ML SOLN, Inject 2.5 mLs (250 Units total) into the vein every 12 (twelve) hours., Disp: 300 mL, Rfl: 3   meclizine  (ANTIVERT ) 12.5 MG tablet, Take 1 tablet (12.5 mg total) by mouth 3 (three) times daily as needed for dizziness., Disp: 30 tablet, Rfl: 4   OLANZapine  (ZYPREXA ) 5 MG tablet, TAKE 1 TABLET(5 MG) BY MOUTH AT BEDTIME, Disp: 30 tablet, Rfl: 3   ondansetron  (ZOFRAN ) 8 MG tablet, TAKE 1 TABLET(8 MG) BY MOUTH EVERY 8 HOURS AS NEEDED FOR NAUSEA OR VOMITING, Disp: 30 tablet, Rfl: 2   potassium chloride  SA (KLOR-CON  M) 20 MEQ tablet, Take 1 tablet (20 mEq total) by mouth 2 (two) times daily., Disp: 60 tablet, Rfl: 2   ribociclib  succ (KISQALI , 400 MG DOSE,) 200 MG Therapy Pack, Take 2 tablets (400 mg total) by mouth daily. Take for 21 days on, 7 days off, repeat every 28 days., Disp: 63 tablet, Rfl: 6   sodium chloride  flush 0.9 % SOLN injection, Inject 10 mLs into the vein every 12 (twelve) hours., Disp: 600 mL, Rfl: 3   traMADol  (ULTRAM ) 50 MG tablet, Take 1 tablet (50 mg total) by mouth every 6 (six) hours  as needed., Disp: 90 tablet, Rfl: 0   zoledronic  acid (ZOMETA ) 4 MG/5ML injection, Inject 4 mg into the vein every 3 (three) months., Disp: , Rfl:    bisacodyl (DULCOLAX) 5 MG EC tablet, Take 5 mg by mouth daily as needed for moderate constipation. (Patient not taking: Reported on 03/21/2024), Disp: , Rfl:  No current facility-administered medications for this visit.  Facility-Administered Medications Ordered in Other Visits:    heparin  lock flush 100 unit/mL, 500 Units, Intravenous, Once, Sueko Dimichele, Sherryll Donald, MD   sodium chloride  flush (NS) 0.9 % injection 10 mL, 10 mL, Intravenous, PRN, Namon Villarin, Sherryll Donald, MD  Allergies: No Known Allergies  Past Medical History, Surgical history, Social history, and Family History were reviewed and updated.  Review of Systems: Review of Systems  Constitutional:   Positive for fatigue.  HENT:  Negative.    Eyes: Negative.   Respiratory: Negative.    Cardiovascular: Negative.   Gastrointestinal:  Positive for constipation and nausea.  Endocrine: Negative.   Genitourinary: Negative.    Musculoskeletal:  Positive for arthralgias and myalgias.  Skin: Negative.   Neurological: Negative.   Hematological: Negative.   Psychiatric/Behavioral: Negative.      Physical Exam: Vital signs show temperature of 97.3.  Pulse 73.  Blood pressure 118/61.  Weight is 155 pounds.     Wt Readings from Last 3 Encounters:  04/25/24 155 lb 8 oz (70.5 kg)  04/20/24 156 lb 3.2 oz (70.9 kg)  03/21/24 156 lb (70.8 kg)    Physical Exam Vitals reviewed.  Constitutional:      Comments: Left breast shows no masses, edema or erythema.  There is no left axillary adenopathy.  Her right chest wall shows a well-healing mastectomy.  There is no erythema or warmth.  There is no right axillary adenopathy.  Tracy Thompson  HENT:     Head: Normocephalic and atraumatic.  Eyes:     Pupils: Pupils are equal, round, and reactive to light.  Cardiovascular:     Rate and Rhythm: Normal rate and regular rhythm.     Heart sounds: Normal heart sounds.  Pulmonary:     Effort: Pulmonary effort is normal.     Breath sounds: Normal breath sounds.  Abdominal:     General: Bowel sounds are normal.     Palpations: Abdomen is soft.  Musculoskeletal:        General: No tenderness or deformity. Normal range of motion.     Cervical back: Normal range of motion.  Lymphadenopathy:     Cervical: No cervical adenopathy.  Skin:    General: Skin is warm and dry.     Findings: No erythema or rash.  Neurological:     Mental Status: She is alert and oriented to person, place, and time.  Psychiatric:        Behavior: Behavior normal.        Thought Content: Thought content normal.        Judgment: Judgment normal.      Lab Results  Component Value Date   WBC 2.8 (L) 04/25/2024   HGB 9.9 (L) 04/25/2024    HCT 28.6 (L) 04/25/2024   MCV 109.2 (H) 04/25/2024   PLT 257 04/25/2024     Chemistry      Component Value Date/Time   NA 141 03/21/2024 1420   K 4.0 03/21/2024 1420   CL 105 03/21/2024 1420   CO2 30 03/21/2024 1420   BUN 21 03/21/2024 1420   CREATININE 1.33 (H) 03/21/2024 1420  Component Value Date/Time   CALCIUM  8.9 03/21/2024 1420   ALKPHOS 87 03/21/2024 1420   AST 31 03/21/2024 1420   ALT 24 03/21/2024 1420   BILITOT 0.3 03/21/2024 1420        Impression and Plan: Tracy Thompson is a very nice 73 year old white female.  She has metastatic breast cancer.  We have her on antiestrogen therapy.  So far, I think she is done well with the antiestrogen therapy.  Her PET scan confirms that everything is going well.  We do not have to make a change in her protocol which I am happy about.  She will get the Faslodex  today.  She will get her Zometa  when we see her back in May.  We will see what her anemia studies show.  I wanted to try to improve her anemia so she will have a little more energy and be able to do more what she would like to do.  We will plan to get her back in 1 month.   Ivor Mars, MD 4/30/202512:23 PM

## 2024-04-26 ENCOUNTER — Encounter: Payer: Self-pay | Admitting: *Deleted

## 2024-04-26 LAB — CANCER ANTIGEN 27.29: CA 27.29: 22.7 U/mL (ref 0.0–38.6)

## 2024-04-27 ENCOUNTER — Other Ambulatory Visit (HOSPITAL_BASED_OUTPATIENT_CLINIC_OR_DEPARTMENT_OTHER): Payer: Self-pay

## 2024-05-01 ENCOUNTER — Other Ambulatory Visit: Payer: Self-pay

## 2024-05-01 NOTE — Progress Notes (Signed)
 Specialty Pharmacy Refill Coordination Note  Tracy Thompson is a 73 y.o. female contacted today regarding refills of specialty medication(s) Ribociclib  Succinate (Kisqali  (400 MG Dose))   Patient requested Delivery   Delivery date: 05/08/24   Verified address: Patient address [address]  [place]   Medication will be filled on 05.12.25.

## 2024-05-02 ENCOUNTER — Inpatient Hospital Stay: Attending: Hematology & Oncology

## 2024-05-02 VITALS — BP 141/72 | HR 77 | Temp 97.7°F | Resp 18

## 2024-05-02 DIAGNOSIS — Z1721 Progesterone receptor positive status: Secondary | ICD-10-CM | POA: Diagnosis not present

## 2024-05-02 DIAGNOSIS — Z79899 Other long term (current) drug therapy: Secondary | ICD-10-CM | POA: Diagnosis not present

## 2024-05-02 DIAGNOSIS — Z1731 Human epidermal growth factor receptor 2 positive status: Secondary | ICD-10-CM | POA: Diagnosis not present

## 2024-05-02 DIAGNOSIS — Z170 Estrogen receptor positive status [ER+]: Secondary | ICD-10-CM | POA: Insufficient documentation

## 2024-05-02 DIAGNOSIS — C7951 Secondary malignant neoplasm of bone: Secondary | ICD-10-CM | POA: Diagnosis not present

## 2024-05-02 DIAGNOSIS — Z7982 Long term (current) use of aspirin: Secondary | ICD-10-CM | POA: Insufficient documentation

## 2024-05-02 DIAGNOSIS — C771 Secondary and unspecified malignant neoplasm of intrathoracic lymph nodes: Secondary | ICD-10-CM | POA: Insufficient documentation

## 2024-05-02 DIAGNOSIS — Z853 Personal history of malignant neoplasm of breast: Secondary | ICD-10-CM

## 2024-05-02 DIAGNOSIS — C50919 Malignant neoplasm of unspecified site of unspecified female breast: Secondary | ICD-10-CM | POA: Diagnosis not present

## 2024-05-02 DIAGNOSIS — Z5111 Encounter for antineoplastic chemotherapy: Secondary | ICD-10-CM | POA: Diagnosis not present

## 2024-05-02 MED ORDER — HEPARIN SOD (PORK) LOCK FLUSH 100 UNIT/ML IV SOLN
500.0000 [IU] | Freq: Once | INTRAVENOUS | Status: AC
  Administered 2024-05-02: 500 [IU] via INTRAVENOUS

## 2024-05-02 MED ORDER — SODIUM CHLORIDE 0.9% FLUSH
10.0000 mL | INTRAVENOUS | Status: DC | PRN
Start: 2024-05-02 — End: 2024-05-02
  Administered 2024-05-02: 10 mL via INTRAVENOUS

## 2024-05-02 NOTE — Patient Instructions (Signed)
 PICC Home Care Guide A peripherally inserted central catheter (PICC) is a form of IV access that allows medicines and IV fluids to be quickly put into the blood and spread throughout the body. The PICC is a long, thin, flexible tube (catheter) that is put into a vein in a person's arm or leg. The catheter ends in a large vein just outside the heart called the superior vena cava (SVC). After the PICC is put in, a chest X-ray may be done to make sure that it is in the right place. A PICC may be placed for different reasons, such as: To give medicines and liquid nutrition. To give IV fluids and blood products. To take blood samples often. If there is trouble placing a peripheral intravenous (PIV) catheter. If cared for properly, a PICC can remain in place for many months. Having a PICC can allow you to go home from the hospital sooner and continue treatment at home. Medicines and PICC care can be managed at home by a family member, caregiver, or home health care team. What are the risks? Generally, having a PICC is safe. However, problems may occur, including: A blood clot (thrombus) forming in or at the end of the PICC. A blood clot forming in a vein (deep vein thrombosis) or traveling to the lung (pulmonary embolism). Inflammation of the vein (phlebitis) in which the PICC is placed. Infection at the insertion site or in the blood. Blood infections from central lines, like PICCs, can be serious and often require a hospital stay. PICC malposition, or PICC movement or poor placement. A break or cut in the PICC. Do not use scissors near the PICC. Nerve or tendon irritation or injury during PICC insertion. How to care for your PICC Please follow the specific guidelines provided by your health care provider. Preventing infection You and any caregivers should wash your hands often with soap and water for at least 20 seconds. Wash hands: Before touching the PICC or the infusion device. Before changing a  bandage (dressing). Do not change the dressing unless you have been taught to do so and have shown you are able to change it safely. Flush the PICC as told. Tell your health care provider right away if the PICC is hard to flush or does not flush. Do not use force to flush the PICC. Use clean and germ-free (sterile) supplies only. Keep the supplies in a dry place. Do not reuse needles, syringes, or any other supplies. Reusing supplies can lead to infection. Keep the PICC dressing dry and secure it with tape if the edges stop sticking to your skin. Check your PICC insertion site every day for signs of infection. Check for: Redness, swelling, or pain. Fluid or blood. Warmth. Pus or a bad smell. Preventing other problems Do not use a syringe that is less than 10 mL to flush the PICC. Do not have your blood pressure checked on the arm in which the PICC is placed. Do not ever pull or tug on the PICC. Keep it secured to your arm with tape or a stretch wrap when not in use. Do not take the PICC out yourself. Only a trained health care provider should remove the PICC. Keep pets and children away from your PICC. How to care for your PICC dressing Keep your PICC dressing clean and dry to prevent infection. Do not take baths, swim, or use a hot tub until your health care provider approves. Ask your health care provider if you can take  showers. You may only be allowed to take sponge baths. When you are allowed to shower: Ask your health care provider to teach you how to wrap the PICC. Cover the PICC with clear plastic wrap and tape to keep it dry while showering. Follow instructions from your health care provider about how to take care of your insertion site and dressing. Make sure you: Wash your hands with soap and water for at least 20 seconds before and after you change your dressing. If soap and water are not available, use hand sanitizer. Change your dressing only if taught to do so by your health care  provider. Your PICC dressing needs to be changed if it becomes loose or wet. Leave stitches (sutures), skin glue, or adhesive strips in place. These skin closures may need to stay in place for 2 weeks or longer. If adhesive strip edges start to loosen and curl up, you may trim the loose edges. Do not remove adhesive strips completely unless your health care provider tells you to do that. Follow these instructions at home: Disposal of supplies Throw away any syringes in a disposal container that is meant for sharp items (sharps container). You can buy a sharps container from a pharmacy, or you can make one by using an empty, hard plastic bottle with a lid. Place any used dressings or infusion bags into a plastic bag. Throw that bag in the trash. General instructions  Always carry your PICC identification card or wear a medical alert bracelet. Keep the tube clamped at all times, unless it is being used. Always carry a smooth-edge clamp with you to clamp the PICC if it breaks. Do not use scissors or sharp objects near the tube. You may bend your arm and move it freely. If your PICC is near or at the bend of your elbow, avoid activity with repeated motion at the elbow. Avoid lifting heavy objects as told by your health care provider. Keep all follow-up visits. This is important. You will need to have your PICC dressing changed at least once a week. Contact a health care provider if: You have pain in your arm, ear, face, or teeth. You have a fever or chills. You have redness, swelling, or pain around the insertion site. You have fluid or blood coming from the insertion site. Your insertion site feels warm to the touch. You have pus or a bad smell coming from the insertion site. Your skin feels hard and raised around the insertion site. Your PICC dressing has gotten wet or is coming off and you have not been taught how to change it. Get help right away if: You have problems with your PICC, such as  your PICC: Was tugged or pulled and has partially come out. Do not  push the PICC back in. Cannot be flushed, is hard to flush, or leaks around the insertion site when it is flushed. Makes a flushing sound when it is flushed. Appears to have a hole or tear. Is accidentally pulled all the way out. If this happens, cover the insertion site with a gauze dressing. Do not throw the PICC away. Your health care provider will need to check it to be sure the entire catheter came out. You feel your heart racing or skipping beats, or you have chest pain. You have shortness of breath or trouble breathing. You have swelling, redness, warmth, or pain in the arm in which the PICC is placed. You have a red streak going up your arm that  starts under the PICC dressing. These symptoms may be an emergency. Get help right away. Call 911. Do not wait to see if the symptoms will go away. Do not drive yourself to the hospital. Summary A peripherally inserted central catheter (PICC) is a long, thin, flexible tube (catheter) that is put into a vein in the arm or leg. If cared for properly, a PICC can remain in place for many months. Having a PICC can allow you to go home from the hospital sooner and continue treatment at home. The PICC is inserted using a germ-free (sterile) technique by a specially trained health care provider. Only a trained health care provider should remove it. Do not have your blood pressure checked on the arm in which your PICC is placed. Always keep your PICC identification card with you. This information is not intended to replace advice given to you by your health care provider. Make sure you discuss any questions you have with your health care provider. Document Revised: 07/01/2021 Document Reviewed: 07/01/2021 Elsevier Patient Education  2024 [employer].

## 2024-05-09 ENCOUNTER — Inpatient Hospital Stay

## 2024-05-09 VITALS — BP 129/78 | HR 83 | Temp 97.7°F | Resp 16

## 2024-05-09 DIAGNOSIS — Z1721 Progesterone receptor positive status: Secondary | ICD-10-CM | POA: Diagnosis not present

## 2024-05-09 DIAGNOSIS — Z170 Estrogen receptor positive status [ER+]: Secondary | ICD-10-CM | POA: Diagnosis not present

## 2024-05-09 DIAGNOSIS — C50919 Malignant neoplasm of unspecified site of unspecified female breast: Secondary | ICD-10-CM | POA: Diagnosis not present

## 2024-05-09 DIAGNOSIS — C7951 Secondary malignant neoplasm of bone: Secondary | ICD-10-CM | POA: Diagnosis not present

## 2024-05-09 DIAGNOSIS — Z1731 Human epidermal growth factor receptor 2 positive status: Secondary | ICD-10-CM | POA: Diagnosis not present

## 2024-05-09 DIAGNOSIS — Z79899 Other long term (current) drug therapy: Secondary | ICD-10-CM | POA: Diagnosis not present

## 2024-05-09 DIAGNOSIS — Z7982 Long term (current) use of aspirin: Secondary | ICD-10-CM | POA: Diagnosis not present

## 2024-05-09 DIAGNOSIS — C771 Secondary and unspecified malignant neoplasm of intrathoracic lymph nodes: Secondary | ICD-10-CM | POA: Diagnosis not present

## 2024-05-09 DIAGNOSIS — Z5111 Encounter for antineoplastic chemotherapy: Secondary | ICD-10-CM | POA: Diagnosis not present

## 2024-05-09 DIAGNOSIS — Z853 Personal history of malignant neoplasm of breast: Secondary | ICD-10-CM

## 2024-05-09 MED ORDER — HEPARIN SOD (PORK) LOCK FLUSH 100 UNIT/ML IV SOLN
500.0000 [IU] | Freq: Once | INTRAVENOUS | Status: AC
Start: 2024-05-09 — End: 2024-05-09
  Administered 2024-05-09: 500 [IU] via INTRAVENOUS

## 2024-05-09 MED ORDER — SODIUM CHLORIDE 0.9% FLUSH
10.0000 mL | INTRAVENOUS | Status: DC | PRN
Start: 2024-05-09 — End: 2024-05-09
  Administered 2024-05-09 (×2): 10 mL via INTRAVENOUS

## 2024-05-09 NOTE — Patient Instructions (Signed)
 PICC Home Care Guide A peripherally inserted central catheter (PICC) is a form of IV access that allows medicines and IV fluids to be quickly put into the blood and spread throughout the body. The PICC is a long, thin, flexible tube (catheter) that is put into a vein in a person's arm or leg. The catheter ends in a large vein just outside the heart called the superior vena cava (SVC). After the PICC is put in, a chest X-ray may be done to make sure that it is in the right place. A PICC may be placed for different reasons, such as: To give medicines and liquid nutrition. To give IV fluids and blood products. To take blood samples often. If there is trouble placing a peripheral intravenous (PIV) catheter. If cared for properly, a PICC can remain in place for many months. Having a PICC can allow you to go home from the hospital sooner and continue treatment at home. Medicines and PICC care can be managed at home by a family member, caregiver, or home health care team. What are the risks? Generally, having a PICC is safe. However, problems may occur, including: A blood clot (thrombus) forming in or at the end of the PICC. A blood clot forming in a vein (deep vein thrombosis) or traveling to the lung (pulmonary embolism). Inflammation of the vein (phlebitis) in which the PICC is placed. Infection at the insertion site or in the blood. Blood infections from central lines, like PICCs, can be serious and often require a hospital stay. PICC malposition, or PICC movement or poor placement. A break or cut in the PICC. Do not use scissors near the PICC. Nerve or tendon irritation or injury during PICC insertion. How to care for your PICC Please follow the specific guidelines provided by your health care provider. Preventing infection You and any caregivers should wash your hands often with soap and water for at least 20 seconds. Wash hands: Before touching the PICC or the infusion device. Before changing a  bandage (dressing). Do not change the dressing unless you have been taught to do so and have shown you are able to change it safely. Flush the PICC as told. Tell your health care provider right away if the PICC is hard to flush or does not flush. Do not use force to flush the PICC. Use clean and germ-free (sterile) supplies only. Keep the supplies in a dry place. Do not reuse needles, syringes, or any other supplies. Reusing supplies can lead to infection. Keep the PICC dressing dry and secure it with tape if the edges stop sticking to your skin. Check your PICC insertion site every day for signs of infection. Check for: Redness, swelling, or pain. Fluid or blood. Warmth. Pus or a bad smell. Preventing other problems Do not use a syringe that is less than 10 mL to flush the PICC. Do not have your blood pressure checked on the arm in which the PICC is placed. Do not ever pull or tug on the PICC. Keep it secured to your arm with tape or a stretch wrap when not in use. Do not take the PICC out yourself. Only a trained health care provider should remove the PICC. Keep pets and children away from your PICC. How to care for your PICC dressing Keep your PICC dressing clean and dry to prevent infection. Do not take baths, swim, or use a hot tub until your health care provider approves. Ask your health care provider if you can take  showers. You may only be allowed to take sponge baths. When you are allowed to shower: Ask your health care provider to teach you how to wrap the PICC. Cover the PICC with clear plastic wrap and tape to keep it dry while showering. Follow instructions from your health care provider about how to take care of your insertion site and dressing. Make sure you: Wash your hands with soap and water for at least 20 seconds before and after you change your dressing. If soap and water are not available, use hand sanitizer. Change your dressing only if taught to do so by your health care  provider. Your PICC dressing needs to be changed if it becomes loose or wet. Leave stitches (sutures), skin glue, or adhesive strips in place. These skin closures may need to stay in place for 2 weeks or longer. If adhesive strip edges start to loosen and curl up, you may trim the loose edges. Do not remove adhesive strips completely unless your health care provider tells you to do that. Follow these instructions at home: Disposal of supplies Throw away any syringes in a disposal container that is meant for sharp items (sharps container). You can buy a sharps container from a pharmacy, or you can make one by using an empty, hard plastic bottle with a lid. Place any used dressings or infusion bags into a plastic bag. Throw that bag in the trash. General instructions  Always carry your PICC identification card or wear a medical alert bracelet. Keep the tube clamped at all times, unless it is being used. Always carry a smooth-edge clamp with you to clamp the PICC if it breaks. Do not use scissors or sharp objects near the tube. You may bend your arm and move it freely. If your PICC is near or at the bend of your elbow, avoid activity with repeated motion at the elbow. Avoid lifting heavy objects as told by your health care provider. Keep all follow-up visits. This is important. You will need to have your PICC dressing changed at least once a week. Contact a health care provider if: You have pain in your arm, ear, face, or teeth. You have a fever or chills. You have redness, swelling, or pain around the insertion site. You have fluid or blood coming from the insertion site. Your insertion site feels warm to the touch. You have pus or a bad smell coming from the insertion site. Your skin feels hard and raised around the insertion site. Your PICC dressing has gotten wet or is coming off and you have not been taught how to change it. Get help right away if: You have problems with your PICC, such as  your PICC: Was tugged or pulled and has partially come out. Do not  push the PICC back in. Cannot be flushed, is hard to flush, or leaks around the insertion site when it is flushed. Makes a flushing sound when it is flushed. Appears to have a hole or tear. Is accidentally pulled all the way out. If this happens, cover the insertion site with a gauze dressing. Do not throw the PICC away. Your health care provider will need to check it to be sure the entire catheter came out. You feel your heart racing or skipping beats, or you have chest pain. You have shortness of breath or trouble breathing. You have swelling, redness, warmth, or pain in the arm in which the PICC is placed. You have a red streak going up your arm that  starts under the PICC dressing. These symptoms may be an emergency. Get help right away. Call 911. Do not wait to see if the symptoms will go away. Do not drive yourself to the hospital. Summary A peripherally inserted central catheter (PICC) is a long, thin, flexible tube (catheter) that is put into a vein in the arm or leg. If cared for properly, a PICC can remain in place for many months. Having a PICC can allow you to go home from the hospital sooner and continue treatment at home. The PICC is inserted using a germ-free (sterile) technique by a specially trained health care provider. Only a trained health care provider should remove it. Do not have your blood pressure checked on the arm in which your PICC is placed. Always keep your PICC identification card with you. This information is not intended to replace advice given to you by your health care provider. Make sure you discuss any questions you have with your health care provider. Document Revised: 07/01/2021 Document Reviewed: 07/01/2021 Elsevier Patient Education  2024 [employer].

## 2024-05-14 ENCOUNTER — Other Ambulatory Visit (HOSPITAL_BASED_OUTPATIENT_CLINIC_OR_DEPARTMENT_OTHER): Payer: Self-pay

## 2024-05-14 ENCOUNTER — Other Ambulatory Visit: Payer: Self-pay

## 2024-05-14 ENCOUNTER — Encounter: Payer: Self-pay | Admitting: Hematology & Oncology

## 2024-05-16 ENCOUNTER — Inpatient Hospital Stay

## 2024-05-16 VITALS — BP 101/67 | HR 78 | Resp 18

## 2024-05-16 DIAGNOSIS — Z1731 Human epidermal growth factor receptor 2 positive status: Secondary | ICD-10-CM | POA: Diagnosis not present

## 2024-05-16 DIAGNOSIS — C771 Secondary and unspecified malignant neoplasm of intrathoracic lymph nodes: Secondary | ICD-10-CM | POA: Diagnosis not present

## 2024-05-16 DIAGNOSIS — Z1721 Progesterone receptor positive status: Secondary | ICD-10-CM | POA: Diagnosis not present

## 2024-05-16 DIAGNOSIS — Z7982 Long term (current) use of aspirin: Secondary | ICD-10-CM | POA: Diagnosis not present

## 2024-05-16 DIAGNOSIS — C50911 Malignant neoplasm of unspecified site of right female breast: Secondary | ICD-10-CM

## 2024-05-16 DIAGNOSIS — C50919 Malignant neoplasm of unspecified site of unspecified female breast: Secondary | ICD-10-CM | POA: Diagnosis not present

## 2024-05-16 DIAGNOSIS — Z5111 Encounter for antineoplastic chemotherapy: Secondary | ICD-10-CM | POA: Diagnosis not present

## 2024-05-16 DIAGNOSIS — C7951 Secondary malignant neoplasm of bone: Secondary | ICD-10-CM | POA: Diagnosis not present

## 2024-05-16 DIAGNOSIS — Z170 Estrogen receptor positive status [ER+]: Secondary | ICD-10-CM | POA: Diagnosis not present

## 2024-05-16 DIAGNOSIS — Z79899 Other long term (current) drug therapy: Secondary | ICD-10-CM | POA: Diagnosis not present

## 2024-05-16 MED ORDER — SODIUM CHLORIDE 0.9% FLUSH
10.0000 mL | Freq: Once | INTRAVENOUS | Status: AC
  Administered 2024-05-16: 10 mL

## 2024-05-16 MED ORDER — HEPARIN SOD (PORK) LOCK FLUSH 100 UNIT/ML IV SOLN
250.0000 [IU] | Freq: Once | INTRAVENOUS | Status: AC
  Administered 2024-05-16: 250 [IU] via INTRAVENOUS

## 2024-05-16 NOTE — Patient Instructions (Signed)
 PICC Home Care Guide A peripherally inserted central catheter (PICC) is a form of IV access that allows medicines and IV fluids to be quickly put into the blood and spread throughout the body. The PICC is a long, thin, flexible tube (catheter) that is put into a vein in a person's arm or leg. The catheter ends in a large vein just outside the heart called the superior vena cava (SVC). After the PICC is put in, a chest X-ray may be done to make sure that it is in the right place. A PICC may be placed for different reasons, such as: To give medicines and liquid nutrition. To give IV fluids and blood products. To take blood samples often. If there is trouble placing a peripheral intravenous (PIV) catheter. If cared for properly, a PICC can remain in place for many months. Having a PICC can allow you to go home from the hospital sooner and continue treatment at home. Medicines and PICC care can be managed at home by a family member, caregiver, or home health care team. What are the risks? Generally, having a PICC is safe. However, problems may occur, including: A blood clot (thrombus) forming in or at the end of the PICC. A blood clot forming in a vein (deep vein thrombosis) or traveling to the lung (pulmonary embolism). Inflammation of the vein (phlebitis) in which the PICC is placed. Infection at the insertion site or in the blood. Blood infections from central lines, like PICCs, can be serious and often require a hospital stay. PICC malposition, or PICC movement or poor placement. A break or cut in the PICC. Do not use scissors near the PICC. Nerve or tendon irritation or injury during PICC insertion. How to care for your PICC Please follow the specific guidelines provided by your health care provider. Preventing infection You and any caregivers should wash your hands often with soap and water for at least 20 seconds. Wash hands: Before touching the PICC or the infusion device. Before changing a  bandage (dressing). Do not change the dressing unless you have been taught to do so and have shown you are able to change it safely. Flush the PICC as told. Tell your health care provider right away if the PICC is hard to flush or does not flush. Do not use force to flush the PICC. Use clean and germ-free (sterile) supplies only. Keep the supplies in a dry place. Do not reuse needles, syringes, or any other supplies. Reusing supplies can lead to infection. Keep the PICC dressing dry and secure it with tape if the edges stop sticking to your skin. Check your PICC insertion site every day for signs of infection. Check for: Redness, swelling, or pain. Fluid or blood. Warmth. Pus or a bad smell. Preventing other problems Do not use a syringe that is less than 10 mL to flush the PICC. Do not have your blood pressure checked on the arm in which the PICC is placed. Do not ever pull or tug on the PICC. Keep it secured to your arm with tape or a stretch wrap when not in use. Do not take the PICC out yourself. Only a trained health care provider should remove the PICC. Keep pets and children away from your PICC. How to care for your PICC dressing Keep your PICC dressing clean and dry to prevent infection. Do not take baths, swim, or use a hot tub until your health care provider approves. Ask your health care provider if you can take  showers. You may only be allowed to take sponge baths. When you are allowed to shower: Ask your health care provider to teach you how to wrap the PICC. Cover the PICC with clear plastic wrap and tape to keep it dry while showering. Follow instructions from your health care provider about how to take care of your insertion site and dressing. Make sure you: Wash your hands with soap and water for at least 20 seconds before and after you change your dressing. If soap and water are not available, use hand sanitizer. Change your dressing only if taught to do so by your health care  provider. Your PICC dressing needs to be changed if it becomes loose or wet. Leave stitches (sutures), skin glue, or adhesive strips in place. These skin closures may need to stay in place for 2 weeks or longer. If adhesive strip edges start to loosen and curl up, you may trim the loose edges. Do not remove adhesive strips completely unless your health care provider tells you to do that. Follow these instructions at home: Disposal of supplies Throw away any syringes in a disposal container that is meant for sharp items (sharps container). You can buy a sharps container from a pharmacy, or you can make one by using an empty, hard plastic bottle with a lid. Place any used dressings or infusion bags into a plastic bag. Throw that bag in the trash. General instructions  Always carry your PICC identification card or wear a medical alert bracelet. Keep the tube clamped at all times, unless it is being used. Always carry a smooth-edge clamp with you to clamp the PICC if it breaks. Do not use scissors or sharp objects near the tube. You may bend your arm and move it freely. If your PICC is near or at the bend of your elbow, avoid activity with repeated motion at the elbow. Avoid lifting heavy objects as told by your health care provider. Keep all follow-up visits. This is important. You will need to have your PICC dressing changed at least once a week. Contact a health care provider if: You have pain in your arm, ear, face, or teeth. You have a fever or chills. You have redness, swelling, or pain around the insertion site. You have fluid or blood coming from the insertion site. Your insertion site feels warm to the touch. You have pus or a bad smell coming from the insertion site. Your skin feels hard and raised around the insertion site. Your PICC dressing has gotten wet or is coming off and you have not been taught how to change it. Get help right away if: You have problems with your PICC, such as  your PICC: Was tugged or pulled and has partially come out. Do not  push the PICC back in. Cannot be flushed, is hard to flush, or leaks around the insertion site when it is flushed. Makes a flushing sound when it is flushed. Appears to have a hole or tear. Is accidentally pulled all the way out. If this happens, cover the insertion site with a gauze dressing. Do not throw the PICC away. Your health care provider will need to check it to be sure the entire catheter came out. You feel your heart racing or skipping beats, or you have chest pain. You have shortness of breath or trouble breathing. You have swelling, redness, warmth, or pain in the arm in which the PICC is placed. You have a red streak going up your arm that  starts under the PICC dressing. These symptoms may be an emergency. Get help right away. Call 911. Do not wait to see if the symptoms will go away. Do not drive yourself to the hospital. Summary A peripherally inserted central catheter (PICC) is a long, thin, flexible tube (catheter) that is put into a vein in the arm or leg. If cared for properly, a PICC can remain in place for many months. Having a PICC can allow you to go home from the hospital sooner and continue treatment at home. The PICC is inserted using a germ-free (sterile) technique by a specially trained health care provider. Only a trained health care provider should remove it. Do not have your blood pressure checked on the arm in which your PICC is placed. Always keep your PICC identification card with you. This information is not intended to replace advice given to you by your health care provider. Make sure you discuss any questions you have with your health care provider. Document Revised: 07/01/2021 Document Reviewed: 07/01/2021 Elsevier Patient Education  2024 [employer].

## 2024-05-23 ENCOUNTER — Encounter: Payer: Self-pay | Admitting: Hematology & Oncology

## 2024-05-23 ENCOUNTER — Inpatient Hospital Stay

## 2024-05-23 ENCOUNTER — Other Ambulatory Visit: Payer: Self-pay

## 2024-05-23 ENCOUNTER — Inpatient Hospital Stay: Admitting: Hematology & Oncology

## 2024-05-23 VITALS — BP 117/59 | Temp 98.6°F | Resp 18 | Ht 66.0 in | Wt 155.0 lb

## 2024-05-23 VITALS — BP 123/50 | HR 73

## 2024-05-23 DIAGNOSIS — C50919 Malignant neoplasm of unspecified site of unspecified female breast: Secondary | ICD-10-CM

## 2024-05-23 DIAGNOSIS — Z1721 Progesterone receptor positive status: Secondary | ICD-10-CM | POA: Diagnosis not present

## 2024-05-23 DIAGNOSIS — Z7982 Long term (current) use of aspirin: Secondary | ICD-10-CM | POA: Diagnosis not present

## 2024-05-23 DIAGNOSIS — C771 Secondary and unspecified malignant neoplasm of intrathoracic lymph nodes: Secondary | ICD-10-CM | POA: Diagnosis not present

## 2024-05-23 DIAGNOSIS — Z170 Estrogen receptor positive status [ER+]: Secondary | ICD-10-CM | POA: Diagnosis not present

## 2024-05-23 DIAGNOSIS — C7951 Secondary malignant neoplasm of bone: Secondary | ICD-10-CM | POA: Diagnosis not present

## 2024-05-23 DIAGNOSIS — D500 Iron deficiency anemia secondary to blood loss (chronic): Secondary | ICD-10-CM

## 2024-05-23 DIAGNOSIS — Z5111 Encounter for antineoplastic chemotherapy: Secondary | ICD-10-CM | POA: Diagnosis not present

## 2024-05-23 DIAGNOSIS — Z1731 Human epidermal growth factor receptor 2 positive status: Secondary | ICD-10-CM | POA: Diagnosis not present

## 2024-05-23 DIAGNOSIS — C50911 Malignant neoplasm of unspecified site of right female breast: Secondary | ICD-10-CM | POA: Diagnosis not present

## 2024-05-23 DIAGNOSIS — Z79899 Other long term (current) drug therapy: Secondary | ICD-10-CM | POA: Diagnosis not present

## 2024-05-23 LAB — RETICULOCYTES
Immature Retic Fract: 16.6 % — ABNORMAL HIGH (ref 2.3–15.9)
RBC.: 2.64 MIL/uL — ABNORMAL LOW (ref 3.87–5.11)
Retic Count, Absolute: 41.2 10*3/uL (ref 19.0–186.0)
Retic Ct Pct: 1.6 % (ref 0.4–3.1)

## 2024-05-23 LAB — CMP (CANCER CENTER ONLY)
ALT: 14 U/L (ref 0–44)
AST: 26 U/L (ref 15–41)
Albumin: 3.6 g/dL (ref 3.5–5.0)
Alkaline Phosphatase: 55 U/L (ref 38–126)
Anion gap: 6 (ref 5–15)
BUN: 19 mg/dL (ref 8–23)
CO2: 30 mmol/L (ref 22–32)
Calcium: 8.7 mg/dL — ABNORMAL LOW (ref 8.9–10.3)
Chloride: 103 mmol/L (ref 98–111)
Creatinine: 1.37 mg/dL — ABNORMAL HIGH (ref 0.44–1.00)
GFR, Estimated: 41 mL/min — ABNORMAL LOW (ref >60)
Glucose, Bld: 115 mg/dL — ABNORMAL HIGH (ref 70–99)
Potassium: 3.9 mmol/L (ref 3.5–5.1)
Sodium: 139 mmol/L (ref 135–145)
Total Bilirubin: 0.4 mg/dL (ref 0.0–1.2)
Total Protein: 5.7 g/dL — ABNORMAL LOW (ref 6.5–8.1)

## 2024-05-23 LAB — CBC WITH DIFFERENTIAL (CANCER CENTER ONLY)
Abs Immature Granulocytes: 0.02 10*3/uL (ref 0.00–0.07)
Basophils Absolute: 0 10*3/uL (ref 0.0–0.1)
Basophils Relative: 1 %
Eosinophils Absolute: 0 10*3/uL (ref 0.0–0.5)
Eosinophils Relative: 1 %
HCT: 27.8 % — ABNORMAL LOW (ref 36.0–46.0)
Hemoglobin: 9.9 g/dL — ABNORMAL LOW (ref 12.0–15.0)
Immature Granulocytes: 1 %
Lymphocytes Relative: 17 %
Lymphs Abs: 0.4 10*3/uL — ABNORMAL LOW (ref 0.7–4.0)
MCH: 37.8 pg — ABNORMAL HIGH (ref 26.0–34.0)
MCHC: 35.6 g/dL (ref 30.0–36.0)
MCV: 106.1 fL — ABNORMAL HIGH (ref 80.0–100.0)
Monocytes Absolute: 0.3 10*3/uL (ref 0.1–1.0)
Monocytes Relative: 13 %
Neutro Abs: 1.6 10*3/uL — ABNORMAL LOW (ref 1.7–7.7)
Neutrophils Relative %: 67 %
Platelet Count: 235 10*3/uL (ref 150–400)
RBC: 2.62 MIL/uL — ABNORMAL LOW (ref 3.87–5.11)
RDW: 12.8 % (ref 11.5–15.5)
WBC Count: 2.3 10*3/uL — ABNORMAL LOW (ref 4.0–10.5)
nRBC: 0 % (ref 0.0–0.2)

## 2024-05-23 LAB — VITAMIN B12: Vitamin B-12: 145 pg/mL — ABNORMAL LOW (ref 180–914)

## 2024-05-23 LAB — FERRITIN: Ferritin: 377 ng/mL — ABNORMAL HIGH (ref 11–307)

## 2024-05-23 MED ORDER — SODIUM CHLORIDE 0.9 % IV SOLN: INTRAVENOUS | Status: DC

## 2024-05-23 MED ORDER — FULVESTRANT 250 MG/5ML IM SOSY
500.0000 mg | PREFILLED_SYRINGE | Freq: Once | INTRAMUSCULAR | Status: AC
  Administered 2024-05-23: 500 mg via INTRAMUSCULAR
  Filled 2024-05-23: qty 10

## 2024-05-23 MED ORDER — SODIUM CHLORIDE 0.9% FLUSH
10.0000 mL | Freq: Once | INTRAVENOUS | Status: AC
  Administered 2024-05-23: 10 mL via INTRAVENOUS

## 2024-05-23 MED ORDER — ZOLEDRONIC ACID 4 MG/100ML IV SOLN
4.0000 mg | Freq: Once | INTRAVENOUS | Status: AC
  Administered 2024-05-23: 4 mg via INTRAVENOUS
  Filled 2024-05-23: qty 100

## 2024-05-23 MED ORDER — HEPARIN SOD (PORK) LOCK FLUSH 100 UNIT/ML IV SOLN
500.0000 [IU] | Freq: Once | INTRAVENOUS | Status: AC
  Administered 2024-05-23: 500 [IU] via INTRAVENOUS

## 2024-05-23 NOTE — Progress Notes (Signed)
 Hematology and Oncology Follow Up Visit  Tracy Thompson [id number] [birth year] 73 y.o. 05/23/2024   Principle Diagnosis:  Metastatic breast cancer-bone metastasis/hilar lymph node metastasis- ER+/PR+/HER-2 (2+) -- PIK3CA (+)  Current Therapy:   Faslodex  500 mg IM monthly -start on 11/09/2022 Ribociclib  600 mg p.o. daily (21/7) --start on 11/09/2022 --changed to 400 mg p.o. daily started on 09/05/2023 Zometa  4 mg IV every 3 months - next dose 07/2024 Radiation therapy to left hip -completed in 11/23/2022     Interim History:  Tracy Thompson is back for follow-up.  Tracy Thompson is doing fairly well.  Tracy Thompson and her husband has her 50th wedding anniversary over the weekend.  I am very impressed by this.  They really I had a very long and very happy marriage.  Tracy Thompson is doing well with the Faslodex  and the ribociclib .  Her last CA 27.29 was down to 23.  Her appetite has been okay.  Tracy Thompson has had no problems with nausea or vomiting.  Has been no abdominal pain.  There is been no change in bowel or bladder habits.  Tracy Thompson has had no back discomfort.  Tracy Thompson has had no leg swelling or pain.  There has been no bleeding.  Tracy Thompson has had no headache.  Overall, I would say that her performance status is probably ECOG 1.    Wt Readings from Last 3 Encounters:  05/23/24 155 lb (70.3 kg)  04/25/24 155 lb 8 oz (70.5 kg)  04/20/24 156 lb 3.2 oz (70.9 kg)     Medications:  Current Outpatient Medications:    aspirin 81 MG tablet, Take 81 mg by mouth daily., Disp: , Rfl:    bisacodyl (DULCOLAX) 5 MG EC tablet, Take 5 mg by mouth daily as needed for moderate constipation. (Patient not taking: Reported on 05/16/2024), Disp: , Rfl:    bisoprolol -hydrochlorothiazide  (ZIAC ) 10-6.25 MG tablet, Take 1 tablet by mouth daily., Disp: 90 tablet, Rfl: 1   carboxymethylcellulose (REFRESH PLUS) 0.5 % SOLN, Place 1 drop into both eyes 3 (three) times daily as needed., Disp: , Rfl:    Cholecalciferol (VITAMIN D ) 50 MCG (2000 UT) CAPS, Take 2,000  Units by mouth daily., Disp: , Rfl:    Heparin  Na, Pork, Lock Flsh PF 100 UNIT/ML SOLN, Inject 2.5 mLs (250 Units total) into the vein every 12 (twelve) hours., Disp: 300 mL, Rfl: 3   meclizine  (ANTIVERT ) 12.5 MG tablet, Take 1 tablet (12.5 mg total) by mouth 3 (three) times daily as needed for dizziness., Disp: 30 tablet, Rfl: 4   OLANZapine  (ZYPREXA ) 5 MG tablet, TAKE 1 TABLET(5 MG) BY MOUTH AT BEDTIME, Disp: 30 tablet, Rfl: 3   ondansetron  (ZOFRAN ) 8 MG tablet, TAKE 1 TABLET(8 MG) BY MOUTH EVERY 8 HOURS AS NEEDED FOR NAUSEA OR VOMITING, Disp: 30 tablet, Rfl: 2   potassium chloride  SA (KLOR-CON  M) 20 MEQ tablet, Take 1 tablet (20 mEq total) by mouth 2 (two) times daily., Disp: 60 tablet, Rfl: 2   ribociclib  succ (KISQALI , 400 MG DOSE,) 200 MG Therapy Pack, Take 2 tablets (400 mg total) by mouth daily. Take for 21 days on, 7 days off, repeat every 28 days., Disp: 63 tablet, Rfl: 6   sodium chloride  flush 0.9 % SOLN injection, Inject 10 mLs into the vein every 12 (twelve) hours., Disp: 600 mL, Rfl: 3   traMADol  (ULTRAM ) 50 MG tablet, Take 1 tablet (50 mg total) by mouth every 6 (six) hours as needed., Disp: 90 tablet, Rfl: 0   zoledronic  acid (ZOMETA )  4 MG/5ML injection, Inject 4 mg into the vein every 3 (three) months., Disp: , Rfl:  No current facility-administered medications for this visit.  Facility-Administered Medications Ordered in Other Visits:    fulvestrant  (FASLODEX ) injection 500 mg, 500 mg, Intramuscular, Once, [place], Sarah M, PA-C  Allergies: No Known Allergies  Past Medical History, Surgical history, Social history, and Family History were reviewed and updated.  Review of Systems: Review of Systems  Constitutional:  Positive for fatigue.  HENT:  Negative.    Eyes: Negative.   Respiratory: Negative.    Cardiovascular: Negative.   Gastrointestinal:  Positive for constipation and nausea.  Endocrine: Negative.   Genitourinary: Negative.    Musculoskeletal:  Positive for  arthralgias and myalgias.  Skin: Negative.   Neurological: Negative.   Hematological: Negative.   Psychiatric/Behavioral: Negative.      Physical Exam: Vital signs show temperature of 98.6.  Pulse 72.  Blood pressure 117/59.  Weight is 155 pounds.     Wt Readings from Last 3 Encounters:  05/23/24 155 lb (70.3 kg)  04/25/24 155 lb 8 oz (70.5 kg)  04/20/24 156 lb 3.2 oz (70.9 kg)    Physical Exam Vitals reviewed.  Constitutional:      Comments: Left breast shows no masses, edema or erythema.  There is no left axillary adenopathy.  Her right chest wall shows a well-healing mastectomy.  There is no erythema or warmth.  There is no right axillary adenopathy.  [name]  HENT:     Head: Normocephalic and atraumatic.  Eyes:     Pupils: Pupils are equal, round, and reactive to light.  Cardiovascular:     Rate and Rhythm: Normal rate and regular rhythm.     Heart sounds: Normal heart sounds.  Pulmonary:     Effort: Pulmonary effort is normal.     Breath sounds: Normal breath sounds.  Abdominal:     General: Bowel sounds are normal.     Palpations: Abdomen is soft.  Musculoskeletal:        General: No tenderness or deformity. Normal range of motion.     Cervical back: Normal range of motion.  Lymphadenopathy:     Cervical: No cervical adenopathy.  Skin:    General: Skin is warm and dry.     Findings: No erythema or rash.  Neurological:     Mental Status: Tracy Thompson is alert and oriented to person, place, and time.  Psychiatric:        Behavior: Behavior normal.        Thought Content: Thought content normal.        Judgment: Judgment normal.     Lab Results  Component Value Date   WBC 2.3 (L) 05/23/2024   HGB 9.9 (L) 05/23/2024   HCT 27.8 (L) 05/23/2024   MCV 106.1 (H) 05/23/2024   PLT 235 05/23/2024     Chemistry      Component Value Date/Time   NA 140 04/25/2024 1122   K 3.7 04/25/2024 1122   CL 104 04/25/2024 1122   CO2 31 04/25/2024 1122   BUN 18 04/25/2024 1122    CREATININE 1.38 (H) 04/25/2024 1122      Component Value Date/Time   CALCIUM  8.8 (L) 04/25/2024 1122   ALKPHOS 65 04/25/2024 1122   AST 23 04/25/2024 1122   ALT 15 04/25/2024 1122   BILITOT 0.3 04/25/2024 1122        Impression and Plan: Tracy Thompson is a very nice 73 year old white female.  Tracy Thompson has  metastatic breast cancer.  We have her on antiestrogen therapy.  So far, I think Tracy Thompson is done well with the antiestrogen therapy.  I am so happy that Tracy Thompson and her husband had a wonderful 50th wedding anniversary.  So far, I do not see anything that looks like it is progressive with her breast cancer.  Her last PET scan was done back in April so I do not think Tracy Thompson needs another scan probably till July or August.  We will go ahead and plan to get her back in another month.  Tracy Thompson gets her Zometa  today.    Ivor Mars, MD 5/28/20252:36 PM

## 2024-05-23 NOTE — Patient Instructions (Signed)
 PICC Home Care Guide A peripherally inserted central catheter (PICC) is a form of IV access that allows medicines and IV fluids to be quickly put into the blood and spread throughout the body. The PICC is a long, thin, flexible tube (catheter) that is put into a vein in a person's arm or leg. The catheter ends in a large vein just outside the heart called the superior vena cava (SVC). After the PICC is put in, a chest X-ray may be done to make sure that it is in the right place. A PICC may be placed for different reasons, such as: To give medicines and liquid nutrition. To give IV fluids and blood products. To take blood samples often. If there is trouble placing a peripheral intravenous (PIV) catheter. If cared for properly, a PICC can remain in place for many months. Having a PICC can allow you to go home from the hospital sooner and continue treatment at home. Medicines and PICC care can be managed at home by a family member, caregiver, or home health care team. What are the risks? Generally, having a PICC is safe. However, problems may occur, including: A blood clot (thrombus) forming in or at the end of the PICC. A blood clot forming in a vein (deep vein thrombosis) or traveling to the lung (pulmonary embolism). Inflammation of the vein (phlebitis) in which the PICC is placed. Infection at the insertion site or in the blood. Blood infections from central lines, like PICCs, can be serious and often require a hospital stay. PICC malposition, or PICC movement or poor placement. A break or cut in the PICC. Do not use scissors near the PICC. Nerve or tendon irritation or injury during PICC insertion. How to care for your PICC Please follow the specific guidelines provided by your health care provider. Preventing infection You and any caregivers should wash your hands often with soap and water for at least 20 seconds. Wash hands: Before touching the PICC or the infusion device. Before changing a  bandage (dressing). Do not change the dressing unless you have been taught to do so and have shown you are able to change it safely. Flush the PICC as told. Tell your health care provider right away if the PICC is hard to flush or does not flush. Do not use force to flush the PICC. Use clean and germ-free (sterile) supplies only. Keep the supplies in a dry place. Do not reuse needles, syringes, or any other supplies. Reusing supplies can lead to infection. Keep the PICC dressing dry and secure it with tape if the edges stop sticking to your skin. Check your PICC insertion site every day for signs of infection. Check for: Redness, swelling, or pain. Fluid or blood. Warmth. Pus or a bad smell. Preventing other problems Do not use a syringe that is less than 10 mL to flush the PICC. Do not have your blood pressure checked on the arm in which the PICC is placed. Do not ever pull or tug on the PICC. Keep it secured to your arm with tape or a stretch wrap when not in use. Do not take the PICC out yourself. Only a trained health care provider should remove the PICC. Keep pets and children away from your PICC. How to care for your PICC dressing Keep your PICC dressing clean and dry to prevent infection. Do not take baths, swim, or use a hot tub until your health care provider approves. Ask your health care provider if you can take  showers. You may only be allowed to take sponge baths. When you are allowed to shower: Ask your health care provider to teach you how to wrap the PICC. Cover the PICC with clear plastic wrap and tape to keep it dry while showering. Follow instructions from your health care provider about how to take care of your insertion site and dressing. Make sure you: Wash your hands with soap and water for at least 20 seconds before and after you change your dressing. If soap and water are not available, use hand sanitizer. Change your dressing only if taught to do so by your health care  provider. Your PICC dressing needs to be changed if it becomes loose or wet. Leave stitches (sutures), skin glue, or adhesive strips in place. These skin closures may need to stay in place for 2 weeks or longer. If adhesive strip edges start to loosen and curl up, you may trim the loose edges. Do not remove adhesive strips completely unless your health care provider tells you to do that. Follow these instructions at home: Disposal of supplies Throw away any syringes in a disposal container that is meant for sharp items (sharps container). You can buy a sharps container from a pharmacy, or you can make one by using an empty, hard plastic bottle with a lid. Place any used dressings or infusion bags into a plastic bag. Throw that bag in the trash. General instructions  Always carry your PICC identification card or wear a medical alert bracelet. Keep the tube clamped at all times, unless it is being used. Always carry a smooth-edge clamp with you to clamp the PICC if it breaks. Do not use scissors or sharp objects near the tube. You may bend your arm and move it freely. If your PICC is near or at the bend of your elbow, avoid activity with repeated motion at the elbow. Avoid lifting heavy objects as told by your health care provider. Keep all follow-up visits. This is important. You will need to have your PICC dressing changed at least once a week. Contact a health care provider if: You have pain in your arm, ear, face, or teeth. You have a fever or chills. You have redness, swelling, or pain around the insertion site. You have fluid or blood coming from the insertion site. Your insertion site feels warm to the touch. You have pus or a bad smell coming from the insertion site. Your skin feels hard and raised around the insertion site. Your PICC dressing has gotten wet or is coming off and you have not been taught how to change it. Get help right away if: You have problems with your PICC, such as  your PICC: Was tugged or pulled and has partially come out. Do not  push the PICC back in. Cannot be flushed, is hard to flush, or leaks around the insertion site when it is flushed. Makes a flushing sound when it is flushed. Appears to have a hole or tear. Is accidentally pulled all the way out. If this happens, cover the insertion site with a gauze dressing. Do not throw the PICC away. Your health care provider will need to check it to be sure the entire catheter came out. You feel your heart racing or skipping beats, or you have chest pain. You have shortness of breath or trouble breathing. You have swelling, redness, warmth, or pain in the arm in which the PICC is placed. You have a red streak going up your arm that  starts under the PICC dressing. These symptoms may be an emergency. Get help right away. Call 911. Do not wait to see if the symptoms will go away. Do not drive yourself to the hospital. Summary A peripherally inserted central catheter (PICC) is a long, thin, flexible tube (catheter) that is put into a vein in the arm or leg. If cared for properly, a PICC can remain in place for many months. Having a PICC can allow you to go home from the hospital sooner and continue treatment at home. The PICC is inserted using a germ-free (sterile) technique by a specially trained health care provider. Only a trained health care provider should remove it. Do not have your blood pressure checked on the arm in which your PICC is placed. Always keep your PICC identification card with you. This information is not intended to replace advice given to you by your health care provider. Make sure you discuss any questions you have with your health care provider. Document Revised: 07/01/2021 Document Reviewed: 07/01/2021 Elsevier Patient Education  2024 [employer].

## 2024-05-23 NOTE — Patient Instructions (Signed)
 Fulvestrant Injection What is this medication? FULVESTRANT (ful VES trant) treats breast cancer. It works by blocking the hormone estrogen in breast tissue, which prevents breast cancer cells from spreading or growing. This medicine may be used for other purposes; ask your health care provider or pharmacist if you have questions. COMMON BRAND NAME(S): FASLODEX What should I tell my care team before I take this medication? They need to know if you have any of these conditions: Bleeding disorder Liver disease Low blood cell levels (white cells, red cells, and platelets) An unusual or allergic reaction to fulvestrant, other medications, foods, dyes, or preservatives Pregnant or trying to get pregnant Breastfeeding How should I use this medication? This medication is injected into a muscle. It is given by your care team in a hospital or clinic setting. Talk to your care team about the use of this medication in children. Special care may be needed. Overdosage: If you think you have taken too much of this medicine contact a poison control center or emergency room at once. NOTE: This medicine is only for you. Do not share this medicine with others. What if I miss a dose? Keep appointments for follow-up doses. It is important not to miss your dose. Call your care team if you are unable to keep an appointment. What may interact with this medication? Fluoroestradiol F18 This list may not describe all possible interactions. Give your health care provider a list of all the medicines, herbs, non-prescription drugs, or dietary supplements you use. Also tell them if you smoke, drink alcohol, or use illegal drugs. Some items may interact with your medicine. What should I watch for while using this medication? Your condition will be monitored carefully while you are receiving this medication. You may need blood work done while you are taking this medication. This medication is injected into a muscle. Talk  to your care team if you also take medications that prevent or treat blood clots, such as warfarin. Blood thinners may increase the risk of bleeding or bruising in the muscle where this medication is injected. The benefits of this medication may outweigh the risks. Your care team can help you find the option that works for you. They can also help limit the risk of bleeding. Talk to your care team if you may be pregnant. Serious birth defects can occur if you take this medication during pregnancy and for 1 year after the last dose. You will need a negative pregnancy test before starting this medication. Contraception is recommended while taking this medication and for 1 year after the last dose. Your care team can help you find the option that works for you. Do not breastfeed while taking this medication and for 1 year after the last dose. This medication may cause infertility. Talk to your care team if you are concerned about your fertility. What side effects may I notice from receiving this medication? Side effects that you should report to your care team as soon as possible: Allergic reactions or angioedema--skin rash, itching or hives, swelling of the face, eyes, lips, tongue, arms, or legs, trouble swallowing or breathing Pain, tingling, or numbness in the hands or feet Side effects that usually do not require medical attention (report to your care team if they continue or are bothersome): Bone, joint, or muscle pain Constipation Headache Hot flashes Nausea Pain, redness, or irritation at injection site Unusual weakness or fatigue This list may not describe all possible side effects. Call your doctor for medical advice about side  effects. You may report side effects to FDA at 1-800-FDA-1088. Where should I keep my medication? This medication is given in a hospital or clinic. It will not be stored at home. NOTE: This sheet is a summary. It may not cover all possible information. If you have  questions about this medicine, talk to your doctor, pharmacist, or health care provider.  2024 Elsevier/Gold Standard (2023-08-19 00:00:00)  Zoledronic Acid Injection (Cancer) What is this medication? ZOLEDRONIC ACID (ZOE le dron ik AS id) treats high calcium levels in the blood caused by cancer. It may also be used with chemotherapy to treat weakened bones caused by cancer. It works by slowing down the release of calcium from bones. This lowers calcium levels in your blood. It also makes your bones stronger and less likely to break (fracture). It belongs to a group of medications called bisphosphonates. This medicine may be used for other purposes; ask your health care provider or pharmacist if you have questions. COMMON BRAND NAME(S): Zometa, Zometa Powder What should I tell my care team before I take this medication? They need to know if you have any of these conditions: Dehydration Dental disease Kidney disease Liver disease Low levels of calcium in the blood Lung or breathing disease, such as asthma Receiving steroids, such as dexamethasone or prednisone An unusual or allergic reaction to zoledronic acid, other medications, foods, dyes, or preservatives Pregnant or trying to get pregnant Breast-feeding How should I use this medication? This medication is injected into a vein. It is given by your care team in a hospital or clinic setting. Talk to your care team about the use of this medication in children. Special care may be needed. Overdosage: If you think you have taken too much of this medicine contact a poison control center or emergency room at once. NOTE: This medicine is only for you. Do not share this medicine with others. What if I miss a dose? Keep appointments for follow-up doses. It is important not to miss your dose. Call your care team if you are unable to keep an appointment. What may interact with this medication? Certain antibiotics given by injection Diuretics,  such as bumetanide, furosemide NSAIDs, medications for pain and inflammation, such as ibuprofen or naproxen Teriparatide Thalidomide This list may not describe all possible interactions. Give your health care provider a list of all the medicines, herbs, non-prescription drugs, or dietary supplements you use. Also tell them if you smoke, drink alcohol, or use illegal drugs. Some items may interact with your medicine. What should I watch for while using this medication? Visit your care team for regular checks on your progress. It may be some time before you see the benefit from this medication. Some people who take this medication have severe bone, joint, or muscle pain. This medication may also increase your risk for jaw problems or a broken thigh bone. Tell your care team right away if you have severe pain in your jaw, bones, joints, or muscles. Tell you care team if you have any pain that does not go away or that gets worse. Tell your dentist and dental surgeon that you are taking this medication. You should not have major dental surgery while on this medication. See your dentist to have a dental exam and fix any dental problems before starting this medication. Take good care of your teeth while on this medication. Make sure you see your dentist for regular follow-up appointments. You should make sure you get enough calcium and vitamin D while  you are taking this medication. Discuss the foods you eat and the vitamins you take with your care team. Check with your care team if you have severe diarrhea, nausea, and vomiting, or if you sweat a lot. The loss of too much body fluid may make it dangerous for you to take this medication. You may need bloodwork while taking this medication. Talk to your care team if you wish to become pregnant or think you might be pregnant. This medication can cause serious birth defects. What side effects may I notice from receiving this medication? Side effects that you  should report to your care team as soon as possible: Allergic reactions--skin rash, itching, hives, swelling of the face, lips, tongue, or throat Kidney injury--decrease in the amount of urine, swelling of the ankles, hands, or feet Low calcium level--muscle pain or cramps, confusion, tingling, or numbness in the hands or feet Osteonecrosis of the jaw--pain, swelling, or redness in the mouth, numbness of the jaw, poor healing after dental work, unusual discharge from the mouth, visible bones in the mouth Severe bone, joint, or muscle pain Side effects that usually do not require medical attention (report to your care team if they continue or are bothersome): Constipation Fatigue Fever Loss of appetite Nausea Stomach pain This list may not describe all possible side effects. Call your doctor for medical advice about side effects. You may report side effects to FDA at 1-800-FDA-1088. Where should I keep my medication? This medication is given in a hospital or clinic. It will not be stored at home. NOTE: This sheet is a summary. It may not cover all possible information. If you have questions about this medicine, talk to your doctor, pharmacist, or health care provider.  2024 Elsevier/Gold Standard (2022-02-05 00:00:00)

## 2024-05-24 ENCOUNTER — Encounter: Payer: Self-pay | Admitting: *Deleted

## 2024-05-24 ENCOUNTER — Ambulatory Visit: Payer: Self-pay | Admitting: Hematology & Oncology

## 2024-05-24 LAB — IRON AND IRON BINDING CAPACITY (CC-WL,HP ONLY)
Iron: 66 ug/dL (ref 28–170)
Saturation Ratios: 23 % (ref 10.4–31.8)
TIBC: 284 ug/dL (ref 250–450)
UIBC: 218 ug/dL (ref 148–442)

## 2024-05-24 LAB — ERYTHROPOIETIN: Erythropoietin: 29.2 m[IU]/mL — ABNORMAL HIGH (ref 2.6–18.5)

## 2024-05-24 LAB — CANCER ANTIGEN 27.29: CA 27.29: 18.2 U/mL (ref 0.0–38.6)

## 2024-05-28 ENCOUNTER — Other Ambulatory Visit (HOSPITAL_BASED_OUTPATIENT_CLINIC_OR_DEPARTMENT_OTHER): Payer: Self-pay

## 2024-05-30 ENCOUNTER — Inpatient Hospital Stay: Attending: Hematology & Oncology

## 2024-05-30 VITALS — BP 134/67 | HR 79 | Temp 97.8°F | Resp 18

## 2024-05-30 DIAGNOSIS — Z5111 Encounter for antineoplastic chemotherapy: Secondary | ICD-10-CM | POA: Insufficient documentation

## 2024-05-30 DIAGNOSIS — C7951 Secondary malignant neoplasm of bone: Secondary | ICD-10-CM | POA: Diagnosis not present

## 2024-05-30 DIAGNOSIS — Z1731 Human epidermal growth factor receptor 2 positive status: Secondary | ICD-10-CM | POA: Insufficient documentation

## 2024-05-30 DIAGNOSIS — Z1721 Progesterone receptor positive status: Secondary | ICD-10-CM | POA: Insufficient documentation

## 2024-05-30 DIAGNOSIS — C771 Secondary and unspecified malignant neoplasm of intrathoracic lymph nodes: Secondary | ICD-10-CM | POA: Insufficient documentation

## 2024-05-30 DIAGNOSIS — Z170 Estrogen receptor positive status [ER+]: Secondary | ICD-10-CM | POA: Insufficient documentation

## 2024-05-30 DIAGNOSIS — C50919 Malignant neoplasm of unspecified site of unspecified female breast: Secondary | ICD-10-CM | POA: Diagnosis not present

## 2024-05-30 MED ORDER — HEPARIN SOD (PORK) LOCK FLUSH 100 UNIT/ML IV SOLN
500.0000 [IU] | Freq: Once | INTRAVENOUS | Status: AC
  Administered 2024-05-30: 500 [IU] via INTRAVENOUS

## 2024-05-30 MED ORDER — SODIUM CHLORIDE 0.9% FLUSH
10.0000 mL | INTRAVENOUS | Status: DC | PRN
  Administered 2024-05-30: 10 mL via INTRAVENOUS

## 2024-05-31 ENCOUNTER — Other Ambulatory Visit (HOSPITAL_COMMUNITY): Payer: Self-pay

## 2024-05-31 ENCOUNTER — Other Ambulatory Visit: Payer: Self-pay

## 2024-05-31 NOTE — Progress Notes (Signed)
 Specialty Pharmacy Refill Coordination Note  Tracy Thompson is a 73 y.o. female contacted today regarding refills of specialty medication(s) Ribociclib  Succinate (Kisqali  (400 MG Dose))   Patient requested Delivery   Delivery date: 06/05/24   Verified address: Patient address [address]  [place]   Medication will be filled on 06/04/24.

## 2024-06-04 ENCOUNTER — Other Ambulatory Visit: Payer: Self-pay

## 2024-06-04 ENCOUNTER — Other Ambulatory Visit (HOSPITAL_BASED_OUTPATIENT_CLINIC_OR_DEPARTMENT_OTHER): Payer: Self-pay

## 2024-06-06 ENCOUNTER — Inpatient Hospital Stay

## 2024-06-06 ENCOUNTER — Other Ambulatory Visit (HOSPITAL_BASED_OUTPATIENT_CLINIC_OR_DEPARTMENT_OTHER): Payer: Self-pay

## 2024-06-06 VITALS — BP 140/80 | HR 80 | Temp 98.2°F | Resp 20

## 2024-06-06 DIAGNOSIS — Z1731 Human epidermal growth factor receptor 2 positive status: Secondary | ICD-10-CM | POA: Diagnosis not present

## 2024-06-06 DIAGNOSIS — Z1721 Progesterone receptor positive status: Secondary | ICD-10-CM | POA: Diagnosis not present

## 2024-06-06 DIAGNOSIS — C771 Secondary and unspecified malignant neoplasm of intrathoracic lymph nodes: Secondary | ICD-10-CM | POA: Diagnosis not present

## 2024-06-06 DIAGNOSIS — C50919 Malignant neoplasm of unspecified site of unspecified female breast: Secondary | ICD-10-CM | POA: Diagnosis not present

## 2024-06-06 DIAGNOSIS — Z170 Estrogen receptor positive status [ER+]: Secondary | ICD-10-CM | POA: Diagnosis not present

## 2024-06-06 DIAGNOSIS — C7951 Secondary malignant neoplasm of bone: Secondary | ICD-10-CM | POA: Diagnosis not present

## 2024-06-06 DIAGNOSIS — Z5111 Encounter for antineoplastic chemotherapy: Secondary | ICD-10-CM | POA: Diagnosis not present

## 2024-06-06 MED ORDER — SODIUM CHLORIDE 0.9% FLUSH
10.0000 mL | INTRAVENOUS | Status: DC | PRN
  Administered 2024-06-06: 10 mL via INTRAVENOUS

## 2024-06-06 MED ORDER — HEPARIN SOD (PORK) LOCK FLUSH 100 UNIT/ML IV SOLN
500.0000 [IU] | Freq: Once | INTRAVENOUS | Status: AC
  Administered 2024-06-06: 500 [IU] via INTRAVENOUS

## 2024-06-06 NOTE — Patient Instructions (Signed)
 PICC Home Care Guide A peripherally inserted central catheter (PICC) is a form of IV access that allows medicines and IV fluids to be quickly put into the blood and spread throughout the body. The PICC is a long, thin, flexible tube (catheter) that is put into a vein in a person's arm or leg. The catheter ends in a large vein just outside the heart called the superior vena cava (SVC). After the PICC is put in, a chest X-ray may be done to make sure that it is in the right place. A PICC may be placed for different reasons, such as: To give medicines and liquid nutrition. To give IV fluids and blood products. To take blood samples often. If there is trouble placing a peripheral intravenous (PIV) catheter. If cared for properly, a PICC can remain in place for many months. Having a PICC can allow you to go home from the hospital sooner and continue treatment at home. Medicines and PICC care can be managed at home by a family member, caregiver, or home health care team. What are the risks? Generally, having a PICC is safe. However, problems may occur, including: A blood clot (thrombus) forming in or at the end of the PICC. A blood clot forming in a vein (deep vein thrombosis) or traveling to the lung (pulmonary embolism). Inflammation of the vein (phlebitis) in which the PICC is placed. Infection at the insertion site or in the blood. Blood infections from central lines, like PICCs, can be serious and often require a hospital stay. PICC malposition, or PICC movement or poor placement. A break or cut in the PICC. Do not use scissors near the PICC. Nerve or tendon irritation or injury during PICC insertion. How to care for your PICC Please follow the specific guidelines provided by your health care provider. Preventing infection You and any caregivers should wash your hands often with soap and water for at least 20 seconds. Wash hands: Before touching the PICC or the infusion device. Before changing a  bandage (dressing). Do not change the dressing unless you have been taught to do so and have shown you are able to change it safely. Flush the PICC as told. Tell your health care provider right away if the PICC is hard to flush or does not flush. Do not use force to flush the PICC. Use clean and germ-free (sterile) supplies only. Keep the supplies in a dry place. Do not reuse needles, syringes, or any other supplies. Reusing supplies can lead to infection. Keep the PICC dressing dry and secure it with tape if the edges stop sticking to your skin. Check your PICC insertion site every day for signs of infection. Check for: Redness, swelling, or pain. Fluid or blood. Warmth. Pus or a bad smell. Preventing other problems Do not use a syringe that is less than 10 mL to flush the PICC. Do not have your blood pressure checked on the arm in which the PICC is placed. Do not ever pull or tug on the PICC. Keep it secured to your arm with tape or a stretch wrap when not in use. Do not take the PICC out yourself. Only a trained health care provider should remove the PICC. Keep pets and children away from your PICC. How to care for your PICC dressing Keep your PICC dressing clean and dry to prevent infection. Do not take baths, swim, or use a hot tub until your health care provider approves. Ask your health care provider if you can take  showers. You may only be allowed to take sponge baths. When you are allowed to shower: Ask your health care provider to teach you how to wrap the PICC. Cover the PICC with clear plastic wrap and tape to keep it dry while showering. Follow instructions from your health care provider about how to take care of your insertion site and dressing. Make sure you: Wash your hands with soap and water for at least 20 seconds before and after you change your dressing. If soap and water are not available, use hand sanitizer. Change your dressing only if taught to do so by your health care  provider. Your PICC dressing needs to be changed if it becomes loose or wet. Leave stitches (sutures), skin glue, or adhesive strips in place. These skin closures may need to stay in place for 2 weeks or longer. If adhesive strip edges start to loosen and curl up, you may trim the loose edges. Do not remove adhesive strips completely unless your health care provider tells you to do that. Follow these instructions at home: Disposal of supplies Throw away any syringes in a disposal container that is meant for sharp items (sharps container). You can buy a sharps container from a pharmacy, or you can make one by using an empty, hard plastic bottle with a lid. Place any used dressings or infusion bags into a plastic bag. Throw that bag in the trash. General instructions  Always carry your PICC identification card or wear a medical alert bracelet. Keep the tube clamped at all times, unless it is being used. Always carry a smooth-edge clamp with you to clamp the PICC if it breaks. Do not use scissors or sharp objects near the tube. You may bend your arm and move it freely. If your PICC is near or at the bend of your elbow, avoid activity with repeated motion at the elbow. Avoid lifting heavy objects as told by your health care provider. Keep all follow-up visits. This is important. You will need to have your PICC dressing changed at least once a week. Contact a health care provider if: You have pain in your arm, ear, face, or teeth. You have a fever or chills. You have redness, swelling, or pain around the insertion site. You have fluid or blood coming from the insertion site. Your insertion site feels warm to the touch. You have pus or a bad smell coming from the insertion site. Your skin feels hard and raised around the insertion site. Your PICC dressing has gotten wet or is coming off and you have not been taught how to change it. Get help right away if: You have problems with your PICC, such as  your PICC: Was tugged or pulled and has partially come out. Do not  push the PICC back in. Cannot be flushed, is hard to flush, or leaks around the insertion site when it is flushed. Makes a flushing sound when it is flushed. Appears to have a hole or tear. Is accidentally pulled all the way out. If this happens, cover the insertion site with a gauze dressing. Do not throw the PICC away. Your health care provider will need to check it to be sure the entire catheter came out. You feel your heart racing or skipping beats, or you have chest pain. You have shortness of breath or trouble breathing. You have swelling, redness, warmth, or pain in the arm in which the PICC is placed. You have a red streak going up your arm that  starts under the PICC dressing. These symptoms may be an emergency. Get help right away. Call 911. Do not wait to see if the symptoms will go away. Do not drive yourself to the hospital. Summary A peripherally inserted central catheter (PICC) is a long, thin, flexible tube (catheter) that is put into a vein in the arm or leg. If cared for properly, a PICC can remain in place for many months. Having a PICC can allow you to go home from the hospital sooner and continue treatment at home. The PICC is inserted using a germ-free (sterile) technique by a specially trained health care provider. Only a trained health care provider should remove it. Do not have your blood pressure checked on the arm in which your PICC is placed. Always keep your PICC identification card with you. This information is not intended to replace advice given to you by your health care provider. Make sure you discuss any questions you have with your health care provider. Document Revised: 07/01/2021 Document Reviewed: 07/01/2021 Elsevier Patient Education  2024 [employer].

## 2024-06-13 ENCOUNTER — Inpatient Hospital Stay

## 2024-06-13 VITALS — BP 118/69 | HR 79 | Temp 98.0°F | Resp 18

## 2024-06-13 DIAGNOSIS — Z1721 Progesterone receptor positive status: Secondary | ICD-10-CM | POA: Diagnosis not present

## 2024-06-13 DIAGNOSIS — C50919 Malignant neoplasm of unspecified site of unspecified female breast: Secondary | ICD-10-CM | POA: Diagnosis not present

## 2024-06-13 DIAGNOSIS — Z5111 Encounter for antineoplastic chemotherapy: Secondary | ICD-10-CM | POA: Diagnosis not present

## 2024-06-13 DIAGNOSIS — Z170 Estrogen receptor positive status [ER+]: Secondary | ICD-10-CM | POA: Diagnosis not present

## 2024-06-13 DIAGNOSIS — C7951 Secondary malignant neoplasm of bone: Secondary | ICD-10-CM | POA: Diagnosis not present

## 2024-06-13 DIAGNOSIS — Z1731 Human epidermal growth factor receptor 2 positive status: Secondary | ICD-10-CM | POA: Diagnosis not present

## 2024-06-13 DIAGNOSIS — C771 Secondary and unspecified malignant neoplasm of intrathoracic lymph nodes: Secondary | ICD-10-CM | POA: Diagnosis not present

## 2024-06-13 MED ORDER — SODIUM CHLORIDE 0.9% FLUSH
10.0000 mL | INTRAVENOUS | Status: DC | PRN
  Administered 2024-06-13: 10 mL via INTRAVENOUS

## 2024-06-13 MED ORDER — HEPARIN SOD (PORK) LOCK FLUSH 100 UNIT/ML IV SOLN
500.0000 [IU] | Freq: Once | INTRAVENOUS | Status: AC
  Administered 2024-06-13: 500 [IU] via INTRAVENOUS

## 2024-06-20 ENCOUNTER — Inpatient Hospital Stay

## 2024-06-20 ENCOUNTER — Inpatient Hospital Stay: Admitting: Hematology & Oncology

## 2024-06-20 ENCOUNTER — Other Ambulatory Visit: Payer: Self-pay

## 2024-06-20 ENCOUNTER — Encounter: Payer: Self-pay | Admitting: Hematology & Oncology

## 2024-06-20 VITALS — BP 129/60 | HR 74 | Temp 97.7°F | Resp 20 | Ht 66.0 in | Wt 154.0 lb

## 2024-06-20 DIAGNOSIS — C771 Secondary and unspecified malignant neoplasm of intrathoracic lymph nodes: Secondary | ICD-10-CM | POA: Diagnosis not present

## 2024-06-20 DIAGNOSIS — C7951 Secondary malignant neoplasm of bone: Secondary | ICD-10-CM

## 2024-06-20 DIAGNOSIS — C50919 Malignant neoplasm of unspecified site of unspecified female breast: Secondary | ICD-10-CM

## 2024-06-20 DIAGNOSIS — C50911 Malignant neoplasm of unspecified site of right female breast: Secondary | ICD-10-CM

## 2024-06-20 DIAGNOSIS — Z5111 Encounter for antineoplastic chemotherapy: Secondary | ICD-10-CM | POA: Diagnosis not present

## 2024-06-20 DIAGNOSIS — Z1731 Human epidermal growth factor receptor 2 positive status: Secondary | ICD-10-CM | POA: Diagnosis not present

## 2024-06-20 DIAGNOSIS — Z170 Estrogen receptor positive status [ER+]: Secondary | ICD-10-CM | POA: Diagnosis not present

## 2024-06-20 DIAGNOSIS — Z1721 Progesterone receptor positive status: Secondary | ICD-10-CM | POA: Diagnosis not present

## 2024-06-20 LAB — CMP (CANCER CENTER ONLY)
ALT: 14 U/L (ref 0–44)
AST: 21 U/L (ref 15–41)
Albumin: 3.6 g/dL (ref 3.5–5.0)
Alkaline Phosphatase: 53 U/L (ref 38–126)
Anion gap: 6 (ref 5–15)
BUN: 17 mg/dL (ref 8–23)
CO2: 29 mmol/L (ref 22–32)
Calcium: 8.8 mg/dL — ABNORMAL LOW (ref 8.9–10.3)
Chloride: 104 mmol/L (ref 98–111)
Creatinine: 1.42 mg/dL — ABNORMAL HIGH (ref 0.44–1.00)
GFR, Estimated: 40 mL/min — ABNORMAL LOW (ref >60)
Glucose, Bld: 103 mg/dL — ABNORMAL HIGH (ref 70–99)
Potassium: 4 mmol/L (ref 3.5–5.1)
Sodium: 139 mmol/L (ref 135–145)
Total Bilirubin: 0.4 mg/dL (ref 0.0–1.2)
Total Protein: 5.9 g/dL — ABNORMAL LOW (ref 6.5–8.1)

## 2024-06-20 LAB — CBC WITH DIFFERENTIAL (CANCER CENTER ONLY)
Abs Immature Granulocytes: 0.01 10*3/uL (ref 0.00–0.07)
Basophils Absolute: 0 10*3/uL (ref 0.0–0.1)
Basophils Relative: 1 %
Eosinophils Absolute: 0 10*3/uL (ref 0.0–0.5)
Eosinophils Relative: 1 %
HCT: 28.1 % — ABNORMAL LOW (ref 36.0–46.0)
Hemoglobin: 9.9 g/dL — ABNORMAL LOW (ref 12.0–15.0)
Immature Granulocytes: 1 %
Lymphocytes Relative: 15 %
Lymphs Abs: 0.3 10*3/uL — ABNORMAL LOW (ref 0.7–4.0)
MCH: 37.6 pg — ABNORMAL HIGH (ref 26.0–34.0)
MCHC: 35.2 g/dL (ref 30.0–36.0)
MCV: 106.8 fL — ABNORMAL HIGH (ref 80.0–100.0)
Monocytes Absolute: 0.3 10*3/uL (ref 0.1–1.0)
Monocytes Relative: 15 %
Neutro Abs: 1.4 10*3/uL — ABNORMAL LOW (ref 1.7–7.7)
Neutrophils Relative %: 67 %
Platelet Count: 246 10*3/uL (ref 150–400)
RBC: 2.63 MIL/uL — ABNORMAL LOW (ref 3.87–5.11)
RDW: 13.4 % (ref 11.5–15.5)
WBC Count: 2.1 10*3/uL — ABNORMAL LOW (ref 4.0–10.5)
nRBC: 0 % (ref 0.0–0.2)

## 2024-06-20 MED ORDER — FULVESTRANT 250 MG/5ML IM SOSY
500.0000 mg | PREFILLED_SYRINGE | Freq: Once | INTRAMUSCULAR | Status: AC
  Administered 2024-06-20: 500 mg via INTRAMUSCULAR
  Filled 2024-06-20: qty 10

## 2024-06-20 MED ORDER — HEPARIN SOD (PORK) LOCK FLUSH 100 UNIT/ML IV SOLN
500.0000 [IU] | Freq: Once | INTRAVENOUS | Status: AC
  Administered 2024-06-20: 500 [IU] via INTRAVENOUS

## 2024-06-20 MED ORDER — SODIUM CHLORIDE 0.9% FLUSH
10.0000 mL | INTRAVENOUS | Status: DC | PRN
  Administered 2024-06-20: 10 mL via INTRAVENOUS

## 2024-06-20 NOTE — Patient Instructions (Signed)
 PICC Home Care Guide A peripherally inserted central catheter (PICC) is a form of IV access that allows medicines and IV fluids to be quickly put into the blood and spread throughout the body. The PICC is a long, thin, flexible tube (catheter) that is put into a vein in a person's arm or leg. The catheter ends in a large vein just outside the heart called the superior vena cava (SVC). After the PICC is put in, a chest X-ray may be done to make sure that it is in the right place. A PICC may be placed for different reasons, such as: To give medicines and liquid nutrition. To give IV fluids and blood products. To take blood samples often. If there is trouble placing a peripheral intravenous (PIV) catheter. If cared for properly, a PICC can remain in place for many months. Having a PICC can allow you to go home from the hospital sooner and continue treatment at home. Medicines and PICC care can be managed at home by a family member, caregiver, or home health care team. What are the risks? Generally, having a PICC is safe. However, problems may occur, including: A blood clot (thrombus) forming in or at the end of the PICC. A blood clot forming in a vein (deep vein thrombosis) or traveling to the lung (pulmonary embolism). Inflammation of the vein (phlebitis) in which the PICC is placed. Infection at the insertion site or in the blood. Blood infections from central lines, like PICCs, can be serious and often require a hospital stay. PICC malposition, or PICC movement or poor placement. A break or cut in the PICC. Do not use scissors near the PICC. Nerve or tendon irritation or injury during PICC insertion. How to care for your PICC Please follow the specific guidelines provided by your health care provider. Preventing infection You and any caregivers should wash your hands often with soap and water for at least 20 seconds. Wash hands: Before touching the PICC or the infusion device. Before changing a  bandage (dressing). Do not change the dressing unless you have been taught to do so and have shown you are able to change it safely. Flush the PICC as told. Tell your health care provider right away if the PICC is hard to flush or does not flush. Do not use force to flush the PICC. Use clean and germ-free (sterile) supplies only. Keep the supplies in a dry place. Do not reuse needles, syringes, or any other supplies. Reusing supplies can lead to infection. Keep the PICC dressing dry and secure it with tape if the edges stop sticking to your skin. Check your PICC insertion site every day for signs of infection. Check for: Redness, swelling, or pain. Fluid or blood. Warmth. Pus or a bad smell. Preventing other problems Do not use a syringe that is less than 10 mL to flush the PICC. Do not have your blood pressure checked on the arm in which the PICC is placed. Do not ever pull or tug on the PICC. Keep it secured to your arm with tape or a stretch wrap when not in use. Do not take the PICC out yourself. Only a trained health care provider should remove the PICC. Keep pets and children away from your PICC. How to care for your PICC dressing Keep your PICC dressing clean and dry to prevent infection. Do not take baths, swim, or use a hot tub until your health care provider approves. Ask your health care provider if you can take  showers. You may only be allowed to take sponge baths. When you are allowed to shower: Ask your health care provider to teach you how to wrap the PICC. Cover the PICC with clear plastic wrap and tape to keep it dry while showering. Follow instructions from your health care provider about how to take care of your insertion site and dressing. Make sure you: Wash your hands with soap and water for at least 20 seconds before and after you change your dressing. If soap and water are not available, use hand sanitizer. Change your dressing only if taught to do so by your health care  provider. Your PICC dressing needs to be changed if it becomes loose or wet. Leave stitches (sutures), skin glue, or adhesive strips in place. These skin closures may need to stay in place for 2 weeks or longer. If adhesive strip edges start to loosen and curl up, you may trim the loose edges. Do not remove adhesive strips completely unless your health care provider tells you to do that. Follow these instructions at home: Disposal of supplies Throw away any syringes in a disposal container that is meant for sharp items (sharps container). You can buy a sharps container from a pharmacy, or you can make one by using an empty, hard plastic bottle with a lid. Place any used dressings or infusion bags into a plastic bag. Throw that bag in the trash. General instructions  Always carry your PICC identification card or wear a medical alert bracelet. Keep the tube clamped at all times, unless it is being used. Always carry a smooth-edge clamp with you to clamp the PICC if it breaks. Do not use scissors or sharp objects near the tube. You may bend your arm and move it freely. If your PICC is near or at the bend of your elbow, avoid activity with repeated motion at the elbow. Avoid lifting heavy objects as told by your health care provider. Keep all follow-up visits. This is important. You will need to have your PICC dressing changed at least once a week. Contact a health care provider if: You have pain in your arm, ear, face, or teeth. You have a fever or chills. You have redness, swelling, or pain around the insertion site. You have fluid or blood coming from the insertion site. Your insertion site feels warm to the touch. You have pus or a bad smell coming from the insertion site. Your skin feels hard and raised around the insertion site. Your PICC dressing has gotten wet or is coming off and you have not been taught how to change it. Get help right away if: You have problems with your PICC, such as  your PICC: Was tugged or pulled and has partially come out. Do not  push the PICC back in. Cannot be flushed, is hard to flush, or leaks around the insertion site when it is flushed. Makes a flushing sound when it is flushed. Appears to have a hole or tear. Is accidentally pulled all the way out. If this happens, cover the insertion site with a gauze dressing. Do not throw the PICC away. Your health care provider will need to check it to be sure the entire catheter came out. You feel your heart racing or skipping beats, or you have chest pain. You have shortness of breath or trouble breathing. You have swelling, redness, warmth, or pain in the arm in which the PICC is placed. You have a red streak going up your arm that  starts under the PICC dressing. These symptoms may be an emergency. Get help right away. Call 911. Do not wait to see if the symptoms will go away. Do not drive yourself to the hospital. Summary A peripherally inserted central catheter (PICC) is a long, thin, flexible tube (catheter) that is put into a vein in the arm or leg. If cared for properly, a PICC can remain in place for many months. Having a PICC can allow you to go home from the hospital sooner and continue treatment at home. The PICC is inserted using a germ-free (sterile) technique by a specially trained health care provider. Only a trained health care provider should remove it. Do not have your blood pressure checked on the arm in which your PICC is placed. Always keep your PICC identification card with you. This information is not intended to replace advice given to you by your health care provider. Make sure you discuss any questions you have with your health care provider. Document Revised: 07/01/2021 Document Reviewed: 07/01/2021 Elsevier Patient Education  2024 [employer].

## 2024-06-20 NOTE — Progress Notes (Signed)
 Hematology and Oncology Follow Up Visit  Tracy Thompson [id number] 1951-10-12 73 y.o. 06/20/2024   Principle Diagnosis:  Metastatic breast cancer-bone metastasis/hilar lymph node metastasis- ER+/PR+/HER-2 (2+) -- PIK3CA (+)  Current Therapy:   Faslodex  500 mg IM monthly -start on 11/09/2022 Ribociclib  600 mg p.o. daily (21/7) --start on 11/09/2022 --changed to 400 mg p.o. daily started on 09/05/2023 Zometa  4 mg IV every 3 months - next dose 07/2024 Radiation therapy to left hip -completed in 11/23/2022     Interim History:  Tracy Thompson is back for follow-up.  She is doing pretty well.  She really has had no complaints since we last saw her.  Her last CA 27.29 was 18.2.  She has had no problems with pain.  She has had no problems with cough or shortness of breath.  She has had no nausea or vomiting.  She has had no bleeding.  There is been no rashes.  She has had no obvious change in bowel or bladder habits.  She has had no headache.  Overall, I would have to say that her performance status is probably ECOG 1.    Wt Readings from Last 3 Encounters:  06/20/24 154 lb 0.6 oz (69.9 kg)  05/23/24 155 lb (70.3 kg)  04/25/24 155 lb 8 oz (70.5 kg)     Medications:  Current Outpatient Medications:    aspirin 81 MG tablet, Take 81 mg by mouth daily., Disp: , Rfl:    bisacodyl (DULCOLAX) 5 MG EC tablet, Take 5 mg by mouth daily as needed for moderate constipation., Disp: , Rfl:    bisoprolol -hydrochlorothiazide  (ZIAC ) 10-6.25 MG tablet, Take 1 tablet by mouth daily., Disp: 90 tablet, Rfl: 1   carboxymethylcellulose (REFRESH PLUS) 0.5 % SOLN, Place 1 drop into both eyes 3 (three) times daily as needed., Disp: , Rfl:    Cholecalciferol (VITAMIN D ) 50 MCG (2000 UT) CAPS, Take 2,000 Units by mouth daily., Disp: , Rfl:    Cyanocobalamin  (VITAMIN B 12 PO), Take 5,000 mcg by mouth daily., Disp: , Rfl:    fulvestrant  (FASLODEX ) 250 MG/5ML injection, Inject 500 mg into the muscle every 30 (thirty) days.  One injection each buttock over 1-2 minutes. Warm prior to use., Disp: , Rfl:    Heparin  Na, Pork, Lock Flsh PF 100 UNIT/ML SOLN, Inject 2.5 mLs (250 Units total) into the vein every 12 (twelve) hours., Disp: 300 mL, Rfl: 3   meclizine  (ANTIVERT ) 12.5 MG tablet, Take 1 tablet (12.5 mg total) by mouth 3 (three) times daily as needed for dizziness., Disp: 30 tablet, Rfl: 4   OLANZapine  (ZYPREXA ) 5 MG tablet, TAKE 1 TABLET(5 MG) BY MOUTH AT BEDTIME (Patient taking differently: Takes at night.), Disp: 30 tablet, Rfl: 3   ondansetron  (ZOFRAN ) 8 MG tablet, TAKE 1 TABLET(8 MG) BY MOUTH EVERY 8 HOURS AS NEEDED FOR NAUSEA OR VOMITING, Disp: 30 tablet, Rfl: 2   potassium chloride  SA (KLOR-CON  M) 20 MEQ tablet, Take 1 tablet (20 mEq total) by mouth 2 (two) times daily. (Patient taking differently: Take 20 mEq by mouth 2 (two) times daily. Takes 10 meq BID.), Disp: 60 tablet, Rfl: 2   ribociclib  succ (KISQALI , 400 MG DOSE,) 200 MG Therapy Pack, Take 2 tablets (400 mg total) by mouth daily. Take for 21 days on, 7 days off, repeat every 28 days., Disp: 63 tablet, Rfl: 6   sodium chloride  flush 0.9 % SOLN injection, Inject 10 mLs into the vein every 12 (twelve) hours., Disp: 600 mL, Rfl: 3  traMADol  (ULTRAM ) 50 MG tablet, Take 1 tablet (50 mg total) by mouth every 6 (six) hours as needed., Disp: 90 tablet, Rfl: 0   zoledronic  acid (ZOMETA ) 4 MG/5ML injection, Inject 4 mg into the vein every 3 (three) months., Disp: , Rfl:  No current facility-administered medications for this visit.  Facility-Administered Medications Ordered in Other Visits:    sodium chloride  flush (NS) 0.9 % injection 10 mL, 10 mL, Intravenous, PRN, Luwanda Starr R, MD, 10 mL at 06/20/24 1030  Allergies: No Known Allergies  Past Medical History, Surgical history, Social history, and Family History were reviewed and updated.  Review of Systems: Review of Systems  Constitutional:  Positive for fatigue.  HENT:  Negative.    Eyes: Negative.    Respiratory: Negative.    Cardiovascular: Negative.   Gastrointestinal:  Positive for constipation and nausea.  Endocrine: Negative.   Genitourinary: Negative.    Musculoskeletal:  Positive for arthralgias and myalgias.  Skin: Negative.   Neurological: Negative.   Hematological: Negative.   Psychiatric/Behavioral: Negative.      Physical Exam: Vital signs show temperature of 97.7.  Pulse 74.  Blood pressure 129/60.  Weight is 154 pounds.    Wt Readings from Last 3 Encounters:  06/20/24 154 lb 0.6 oz (69.9 kg)  05/23/24 155 lb (70.3 kg)  04/25/24 155 lb 8 oz (70.5 kg)    Physical Exam Vitals reviewed.  Constitutional:      Comments: Left breast shows no masses, edema or erythema.  There is no left axillary adenopathy.  Her right chest wall shows a well-healing mastectomy.  There is no erythema or warmth.  There is no right axillary adenopathy.  Tracy Thompson  HENT:     Head: Normocephalic and atraumatic.   Eyes:     Pupils: Pupils are equal, round, and reactive to light.    Cardiovascular:     Rate and Rhythm: Normal rate and regular rhythm.     Heart sounds: Normal heart sounds.  Pulmonary:     Effort: Pulmonary effort is normal.     Breath sounds: Normal breath sounds.  Abdominal:     General: Bowel sounds are normal.     Palpations: Abdomen is soft.   Musculoskeletal:        General: No tenderness or deformity. Normal range of motion.     Cervical back: Normal range of motion.  Lymphadenopathy:     Cervical: No cervical adenopathy.   Skin:    General: Skin is warm and dry.     Findings: No erythema or rash.   Neurological:     Mental Status: She is alert and oriented to person, place, and time.   Psychiatric:        Behavior: Behavior normal.        Thought Content: Thought content normal.        Judgment: Judgment normal.      Lab Results  Component Value Date   WBC 2.1 (L) 06/20/2024   HGB 9.9 (L) 06/20/2024   HCT 28.1 (L) 06/20/2024   MCV 106.8 (H)  06/20/2024   PLT 246 06/20/2024     Chemistry      Component Value Date/Time   NA 139 06/20/2024 1005   K 4.0 06/20/2024 1005   CL 104 06/20/2024 1005   CO2 29 06/20/2024 1005   BUN 17 06/20/2024 1005   CREATININE 1.42 (H) 06/20/2024 1005      Component Value Date/Time   CALCIUM  8.8 (L) 06/20/2024 1005  ALKPHOS 53 06/20/2024 1005   AST 21 06/20/2024 1005   ALT 14 06/20/2024 1005   BILITOT 0.4 06/20/2024 1005        Impression and Plan: Ms. Brummell is a very nice 73 year old white female.  She has metastatic breast cancer.  We have her on antiestrogen therapy.  So far, I think she is done well with the antiestrogen therapy.  She asked me what I thought her prognosis was.  Given the fact that she is doing so well so far, I would have to hope that we are looking at several years at least.  I told her that it would be nice if he got into 2030.  Her tumor marker is certainly moving in the right direction.  Her overall quality life is doing nicely.  For right now, I will plan to get her back to see me in another month.  I think we can probably do another PET scan in August.    Maude JONELLE Crease, MD 6/25/202511:34 AM

## 2024-06-21 LAB — CANCER ANTIGEN 27.29: CA 27.29: 32.9 U/mL (ref 0.0–38.6)

## 2024-06-25 ENCOUNTER — Other Ambulatory Visit: Payer: Self-pay

## 2024-06-25 ENCOUNTER — Other Ambulatory Visit: Payer: Self-pay | Admitting: Hematology & Oncology

## 2024-06-25 ENCOUNTER — Other Ambulatory Visit: Payer: Self-pay | Admitting: Family Medicine

## 2024-06-25 DIAGNOSIS — I10 Essential (primary) hypertension: Secondary | ICD-10-CM

## 2024-06-25 NOTE — Progress Notes (Signed)
 Specialty Pharmacy Refill Coordination Note  Tracy Thompson is a 73 y.o. female contacted today regarding refills of specialty medication(s) Ribociclib  Succinate (Kisqali  (400 MG Dose))   Patient requested Delivery   Delivery date: 07/03/24   Verified address: Patient address [address]  [place]   Medication will be filled on 07.07.25.

## 2024-06-27 ENCOUNTER — Inpatient Hospital Stay: Attending: Hematology & Oncology

## 2024-06-27 VITALS — BP 123/72 | HR 72 | Temp 98.1°F | Resp 18

## 2024-06-27 DIAGNOSIS — Z5111 Encounter for antineoplastic chemotherapy: Secondary | ICD-10-CM | POA: Diagnosis not present

## 2024-06-27 DIAGNOSIS — Z79899 Other long term (current) drug therapy: Secondary | ICD-10-CM | POA: Diagnosis not present

## 2024-06-27 DIAGNOSIS — E538 Deficiency of other specified B group vitamins: Secondary | ICD-10-CM | POA: Insufficient documentation

## 2024-06-27 DIAGNOSIS — Z7982 Long term (current) use of aspirin: Secondary | ICD-10-CM | POA: Diagnosis not present

## 2024-06-27 DIAGNOSIS — C7951 Secondary malignant neoplasm of bone: Secondary | ICD-10-CM | POA: Insufficient documentation

## 2024-06-27 DIAGNOSIS — Z1721 Progesterone receptor positive status: Secondary | ICD-10-CM | POA: Insufficient documentation

## 2024-06-27 DIAGNOSIS — Z170 Estrogen receptor positive status [ER+]: Secondary | ICD-10-CM | POA: Insufficient documentation

## 2024-06-27 DIAGNOSIS — C771 Secondary and unspecified malignant neoplasm of intrathoracic lymph nodes: Secondary | ICD-10-CM | POA: Diagnosis not present

## 2024-06-27 DIAGNOSIS — C50911 Malignant neoplasm of unspecified site of right female breast: Secondary | ICD-10-CM | POA: Diagnosis not present

## 2024-06-27 DIAGNOSIS — C50919 Malignant neoplasm of unspecified site of unspecified female breast: Secondary | ICD-10-CM

## 2024-06-27 MED ORDER — SODIUM CHLORIDE 0.9% FLUSH
10.0000 mL | INTRAVENOUS | Status: DC | PRN
  Administered 2024-06-27: 10 mL via INTRAVENOUS

## 2024-06-27 MED ORDER — HEPARIN SOD (PORK) LOCK FLUSH 100 UNIT/ML IV SOLN
500.0000 [IU] | Freq: Once | INTRAVENOUS | Status: AC
  Administered 2024-06-27: 500 [IU] via INTRAVENOUS

## 2024-06-27 NOTE — Patient Instructions (Signed)
 PICC Home Care Guide A peripherally inserted central catheter (PICC) is a form of IV access that allows medicines and IV fluids to be quickly put into the blood and spread throughout the body. The PICC is a long, thin, flexible tube (catheter) that is put into a vein in a person's arm or leg. The catheter ends in a large vein just outside the heart called the superior vena cava (SVC). After the PICC is put in, a chest X-ray may be done to make sure that it is in the right place. A PICC may be placed for different reasons, such as: To give medicines and liquid nutrition. To give IV fluids and blood products. To take blood samples often. If there is trouble placing a peripheral intravenous (PIV) catheter. If cared for properly, a PICC can remain in place for many months. Having a PICC can allow you to go home from the hospital sooner and continue treatment at home. Medicines and PICC care can be managed at home by a family member, caregiver, or home health care team. What are the risks? Generally, having a PICC is safe. However, problems may occur, including: A blood clot (thrombus) forming in or at the end of the PICC. A blood clot forming in a vein (deep vein thrombosis) or traveling to the lung (pulmonary embolism). Inflammation of the vein (phlebitis) in which the PICC is placed. Infection at the insertion site or in the blood. Blood infections from central lines, like PICCs, can be serious and often require a hospital stay. PICC malposition, or PICC movement or poor placement. A break or cut in the PICC. Do not use scissors near the PICC. Nerve or tendon irritation or injury during PICC insertion. How to care for your PICC Please follow the specific guidelines provided by your health care provider. Preventing infection You and any caregivers should wash your hands often with soap and water for at least 20 seconds. Wash hands: Before touching the PICC or the infusion device. Before changing a  bandage (dressing). Do not change the dressing unless you have been taught to do so and have shown you are able to change it safely. Flush the PICC as told. Tell your health care provider right away if the PICC is hard to flush or does not flush. Do not use force to flush the PICC. Use clean and germ-free (sterile) supplies only. Keep the supplies in a dry place. Do not reuse needles, syringes, or any other supplies. Reusing supplies can lead to infection. Keep the PICC dressing dry and secure it with tape if the edges stop sticking to your skin. Check your PICC insertion site every day for signs of infection. Check for: Redness, swelling, or pain. Fluid or blood. Warmth. Pus or a bad smell. Preventing other problems Do not use a syringe that is less than 10 mL to flush the PICC. Do not have your blood pressure checked on the arm in which the PICC is placed. Do not ever pull or tug on the PICC. Keep it secured to your arm with tape or a stretch wrap when not in use. Do not take the PICC out yourself. Only a trained health care provider should remove the PICC. Keep pets and children away from your PICC. How to care for your PICC dressing Keep your PICC dressing clean and dry to prevent infection. Do not take baths, swim, or use a hot tub until your health care provider approves. Ask your health care provider if you can take  showers. You may only be allowed to take sponge baths. When you are allowed to shower: Ask your health care provider to teach you how to wrap the PICC. Cover the PICC with clear plastic wrap and tape to keep it dry while showering. Follow instructions from your health care provider about how to take care of your insertion site and dressing. Make sure you: Wash your hands with soap and water for at least 20 seconds before and after you change your dressing. If soap and water are not available, use hand sanitizer. Change your dressing only if taught to do so by your health care  provider. Your PICC dressing needs to be changed if it becomes loose or wet. Leave stitches (sutures), skin glue, or adhesive strips in place. These skin closures may need to stay in place for 2 weeks or longer. If adhesive strip edges start to loosen and curl up, you may trim the loose edges. Do not remove adhesive strips completely unless your health care provider tells you to do that. Follow these instructions at home: Disposal of supplies Throw away any syringes in a disposal container that is meant for sharp items (sharps container). You can buy a sharps container from a pharmacy, or you can make one by using an empty, hard plastic bottle with a lid. Place any used dressings or infusion bags into a plastic bag. Throw that bag in the trash. General instructions  Always carry your PICC identification card or wear a medical alert bracelet. Keep the tube clamped at all times, unless it is being used. Always carry a smooth-edge clamp with you to clamp the PICC if it breaks. Do not use scissors or sharp objects near the tube. You may bend your arm and move it freely. If your PICC is near or at the bend of your elbow, avoid activity with repeated motion at the elbow. Avoid lifting heavy objects as told by your health care provider. Keep all follow-up visits. This is important. You will need to have your PICC dressing changed at least once a week. Contact a health care provider if: You have pain in your arm, ear, face, or teeth. You have a fever or chills. You have redness, swelling, or pain around the insertion site. You have fluid or blood coming from the insertion site. Your insertion site feels warm to the touch. You have pus or a bad smell coming from the insertion site. Your skin feels hard and raised around the insertion site. Your PICC dressing has gotten wet or is coming off and you have not been taught how to change it. Get help right away if: You have problems with your PICC, such as  your PICC: Was tugged or pulled and has partially come out. Do not  push the PICC back in. Cannot be flushed, is hard to flush, or leaks around the insertion site when it is flushed. Makes a flushing sound when it is flushed. Appears to have a hole or tear. Is accidentally pulled all the way out. If this happens, cover the insertion site with a gauze dressing. Do not throw the PICC away. Your health care provider will need to check it to be sure the entire catheter came out. You feel your heart racing or skipping beats, or you have chest pain. You have shortness of breath or trouble breathing. You have swelling, redness, warmth, or pain in the arm in which the PICC is placed. You have a red streak going up your arm that  starts under the PICC dressing. These symptoms may be an emergency. Get help right away. Call 911. Do not wait to see if the symptoms will go away. Do not drive yourself to the hospital. Summary A peripherally inserted central catheter (PICC) is a long, thin, flexible tube (catheter) that is put into a vein in the arm or leg. If cared for properly, a PICC can remain in place for many months. Having a PICC can allow you to go home from the hospital sooner and continue treatment at home. The PICC is inserted using a germ-free (sterile) technique by a specially trained health care provider. Only a trained health care provider should remove it. Do not have your blood pressure checked on the arm in which your PICC is placed. Always keep your PICC identification card with you. This information is not intended to replace advice given to you by your health care provider. Make sure you discuss any questions you have with your health care provider. Document Revised: 07/01/2021 Document Reviewed: 07/01/2021 Elsevier Patient Education  2024 [employer].

## 2024-07-02 ENCOUNTER — Other Ambulatory Visit (HOSPITAL_BASED_OUTPATIENT_CLINIC_OR_DEPARTMENT_OTHER): Payer: Self-pay

## 2024-07-02 ENCOUNTER — Other Ambulatory Visit: Payer: Self-pay

## 2024-07-04 ENCOUNTER — Inpatient Hospital Stay

## 2024-07-04 VITALS — BP 117/74 | HR 84 | Temp 98.0°F | Resp 17

## 2024-07-04 DIAGNOSIS — C50911 Malignant neoplasm of unspecified site of right female breast: Secondary | ICD-10-CM | POA: Diagnosis not present

## 2024-07-04 DIAGNOSIS — C7951 Secondary malignant neoplasm of bone: Secondary | ICD-10-CM | POA: Diagnosis not present

## 2024-07-04 DIAGNOSIS — Z79899 Other long term (current) drug therapy: Secondary | ICD-10-CM | POA: Diagnosis not present

## 2024-07-04 DIAGNOSIS — C50919 Malignant neoplasm of unspecified site of unspecified female breast: Secondary | ICD-10-CM

## 2024-07-04 DIAGNOSIS — Z5111 Encounter for antineoplastic chemotherapy: Secondary | ICD-10-CM | POA: Diagnosis not present

## 2024-07-04 DIAGNOSIS — Z1721 Progesterone receptor positive status: Secondary | ICD-10-CM | POA: Diagnosis not present

## 2024-07-04 DIAGNOSIS — C771 Secondary and unspecified malignant neoplasm of intrathoracic lymph nodes: Secondary | ICD-10-CM | POA: Diagnosis not present

## 2024-07-04 DIAGNOSIS — Z170 Estrogen receptor positive status [ER+]: Secondary | ICD-10-CM | POA: Diagnosis not present

## 2024-07-04 DIAGNOSIS — E538 Deficiency of other specified B group vitamins: Secondary | ICD-10-CM | POA: Diagnosis not present

## 2024-07-04 DIAGNOSIS — Z7982 Long term (current) use of aspirin: Secondary | ICD-10-CM | POA: Diagnosis not present

## 2024-07-04 MED ORDER — SODIUM CHLORIDE 0.9% FLUSH
10.0000 mL | Freq: Once | INTRAVENOUS | Status: AC
  Administered 2024-07-04: 10 mL via INTRAVENOUS

## 2024-07-04 MED ORDER — HEPARIN SOD (PORK) LOCK FLUSH 100 UNIT/ML IV SOLN
500.0000 [IU] | Freq: Once | INTRAVENOUS | Status: AC
  Administered 2024-07-04: 500 [IU] via INTRAVENOUS

## 2024-07-04 NOTE — Patient Instructions (Signed)
 PICC Home Care Guide A peripherally inserted central catheter (PICC) is a form of IV access that allows medicines and IV fluids to be quickly put into the blood and spread throughout the body. The PICC is a long, thin, flexible tube (catheter) that is put into a vein in a person's arm or leg. The catheter ends in a large vein just outside the heart called the superior vena cava (SVC). After the PICC is put in, a chest X-ray may be done to make sure that it is in the right place. A PICC may be placed for different reasons, such as: To give medicines and liquid nutrition. To give IV fluids and blood products. To take blood samples often. If there is trouble placing a peripheral intravenous (PIV) catheter. If cared for properly, a PICC can remain in place for many months. Having a PICC can allow you to go home from the hospital sooner and continue treatment at home. Medicines and PICC care can be managed at home by a family member, caregiver, or home health care team. What are the risks? Generally, having a PICC is safe. However, problems may occur, including: A blood clot (thrombus) forming in or at the end of the PICC. A blood clot forming in a vein (deep vein thrombosis) or traveling to the lung (pulmonary embolism). Inflammation of the vein (phlebitis) in which the PICC is placed. Infection at the insertion site or in the blood. Blood infections from central lines, like PICCs, can be serious and often require a hospital stay. PICC malposition, or PICC movement or poor placement. A break or cut in the PICC. Do not use scissors near the PICC. Nerve or tendon irritation or injury during PICC insertion. How to care for your PICC Please follow the specific guidelines provided by your health care provider. Preventing infection You and any caregivers should wash your hands often with soap and water for at least 20 seconds. Wash hands: Before touching the PICC or the infusion device. Before changing a  bandage (dressing). Do not change the dressing unless you have been taught to do so and have shown you are able to change it safely. Flush the PICC as told. Tell your health care provider right away if the PICC is hard to flush or does not flush. Do not use force to flush the PICC. Use clean and germ-free (sterile) supplies only. Keep the supplies in a dry place. Do not reuse needles, syringes, or any other supplies. Reusing supplies can lead to infection. Keep the PICC dressing dry and secure it with tape if the edges stop sticking to your skin. Check your PICC insertion site every day for signs of infection. Check for: Redness, swelling, or pain. Fluid or blood. Warmth. Pus or a bad smell. Preventing other problems Do not use a syringe that is less than 10 mL to flush the PICC. Do not have your blood pressure checked on the arm in which the PICC is placed. Do not ever pull or tug on the PICC. Keep it secured to your arm with tape or a stretch wrap when not in use. Do not take the PICC out yourself. Only a trained health care provider should remove the PICC. Keep pets and children away from your PICC. How to care for your PICC dressing Keep your PICC dressing clean and dry to prevent infection. Do not take baths, swim, or use a hot tub until your health care provider approves. Ask your health care provider if you can take  showers. You may only be allowed to take sponge baths. When you are allowed to shower: Ask your health care provider to teach you how to wrap the PICC. Cover the PICC with clear plastic wrap and tape to keep it dry while showering. Follow instructions from your health care provider about how to take care of your insertion site and dressing. Make sure you: Wash your hands with soap and water for at least 20 seconds before and after you change your dressing. If soap and water are not available, use hand sanitizer. Change your dressing only if taught to do so by your health care  provider. Your PICC dressing needs to be changed if it becomes loose or wet. Leave stitches (sutures), skin glue, or adhesive strips in place. These skin closures may need to stay in place for 2 weeks or longer. If adhesive strip edges start to loosen and curl up, you may trim the loose edges. Do not remove adhesive strips completely unless your health care provider tells you to do that. Follow these instructions at home: Disposal of supplies Throw away any syringes in a disposal container that is meant for sharp items (sharps container). You can buy a sharps container from a pharmacy, or you can make one by using an empty, hard plastic bottle with a lid. Place any used dressings or infusion bags into a plastic bag. Throw that bag in the trash. General instructions  Always carry your PICC identification card or wear a medical alert bracelet. Keep the tube clamped at all times, unless it is being used. Always carry a smooth-edge clamp with you to clamp the PICC if it breaks. Do not use scissors or sharp objects near the tube. You may bend your arm and move it freely. If your PICC is near or at the bend of your elbow, avoid activity with repeated motion at the elbow. Avoid lifting heavy objects as told by your health care provider. Keep all follow-up visits. This is important. You will need to have your PICC dressing changed at least once a week. Contact a health care provider if: You have pain in your arm, ear, face, or teeth. You have a fever or chills. You have redness, swelling, or pain around the insertion site. You have fluid or blood coming from the insertion site. Your insertion site feels warm to the touch. You have pus or a bad smell coming from the insertion site. Your skin feels hard and raised around the insertion site. Your PICC dressing has gotten wet or is coming off and you have not been taught how to change it. Get help right away if: You have problems with your PICC, such as  your PICC: Was tugged or pulled and has partially come out. Do not  push the PICC back in. Cannot be flushed, is hard to flush, or leaks around the insertion site when it is flushed. Makes a flushing sound when it is flushed. Appears to have a hole or tear. Is accidentally pulled all the way out. If this happens, cover the insertion site with a gauze dressing. Do not throw the PICC away. Your health care provider will need to check it to be sure the entire catheter came out. You feel your heart racing or skipping beats, or you have chest pain. You have shortness of breath or trouble breathing. You have swelling, redness, warmth, or pain in the arm in which the PICC is placed. You have a red streak going up your arm that  starts under the PICC dressing. These symptoms may be an emergency. Get help right away. Call 911. Do not wait to see if the symptoms will go away. Do not drive yourself to the hospital. Summary A peripherally inserted central catheter (PICC) is a long, thin, flexible tube (catheter) that is put into a vein in the arm or leg. If cared for properly, a PICC can remain in place for many months. Having a PICC can allow you to go home from the hospital sooner and continue treatment at home. The PICC is inserted using a germ-free (sterile) technique by a specially trained health care provider. Only a trained health care provider should remove it. Do not have your blood pressure checked on the arm in which your PICC is placed. Always keep your PICC identification card with you. This information is not intended to replace advice given to you by your health care provider. Make sure you discuss any questions you have with your health care provider. Document Revised: 07/01/2021 Document Reviewed: 07/01/2021 Elsevier Patient Education  2024 [employer].

## 2024-07-11 ENCOUNTER — Inpatient Hospital Stay

## 2024-07-11 VITALS — BP 120/69 | HR 78 | Temp 98.3°F | Resp 17

## 2024-07-11 DIAGNOSIS — Z7982 Long term (current) use of aspirin: Secondary | ICD-10-CM | POA: Diagnosis not present

## 2024-07-11 DIAGNOSIS — Z170 Estrogen receptor positive status [ER+]: Secondary | ICD-10-CM | POA: Diagnosis not present

## 2024-07-11 DIAGNOSIS — Z79899 Other long term (current) drug therapy: Secondary | ICD-10-CM | POA: Diagnosis not present

## 2024-07-11 DIAGNOSIS — Z5111 Encounter for antineoplastic chemotherapy: Secondary | ICD-10-CM | POA: Diagnosis not present

## 2024-07-11 DIAGNOSIS — C50911 Malignant neoplasm of unspecified site of right female breast: Secondary | ICD-10-CM | POA: Diagnosis not present

## 2024-07-11 DIAGNOSIS — E538 Deficiency of other specified B group vitamins: Secondary | ICD-10-CM | POA: Diagnosis not present

## 2024-07-11 DIAGNOSIS — Z1721 Progesterone receptor positive status: Secondary | ICD-10-CM | POA: Diagnosis not present

## 2024-07-11 DIAGNOSIS — C7951 Secondary malignant neoplasm of bone: Secondary | ICD-10-CM | POA: Diagnosis not present

## 2024-07-11 DIAGNOSIS — D235 Other benign neoplasm of skin of trunk: Secondary | ICD-10-CM

## 2024-07-11 DIAGNOSIS — C771 Secondary and unspecified malignant neoplasm of intrathoracic lymph nodes: Secondary | ICD-10-CM | POA: Diagnosis not present

## 2024-07-11 MED ORDER — SODIUM CHLORIDE 0.9% FLUSH
10.0000 mL | Freq: Once | INTRAVENOUS | Status: AC
  Administered 2024-07-11: 10 mL via INTRAVENOUS

## 2024-07-11 MED ORDER — HEPARIN SOD (PORK) LOCK FLUSH 100 UNIT/ML IV SOLN
500.0000 [IU] | Freq: Once | INTRAVENOUS | Status: AC
  Administered 2024-07-11: 500 [IU] via INTRAVENOUS

## 2024-07-11 NOTE — Patient Instructions (Signed)
 PICC Home Care Guide A peripherally inserted central catheter (PICC) is a form of IV access that allows medicines and IV fluids to be quickly put into the blood and spread throughout the body. The PICC is a long, thin, flexible tube (catheter) that is put into a vein in a person's arm or leg. The catheter ends in a large vein just outside the heart called the superior vena cava (SVC). After the PICC is put in, a chest X-ray may be done to make sure that it is in the right place. A PICC may be placed for different reasons, such as: To give medicines and liquid nutrition. To give IV fluids and blood products. To take blood samples often. If there is trouble placing a peripheral intravenous (PIV) catheter. If cared for properly, a PICC can remain in place for many months. Having a PICC can allow you to go home from the hospital sooner and continue treatment at home. Medicines and PICC care can be managed at home by a family member, caregiver, or home health care team. What are the risks? Generally, having a PICC is safe. However, problems may occur, including: A blood clot (thrombus) forming in or at the end of the PICC. A blood clot forming in a vein (deep vein thrombosis) or traveling to the lung (pulmonary embolism). Inflammation of the vein (phlebitis) in which the PICC is placed. Infection at the insertion site or in the blood. Blood infections from central lines, like PICCs, can be serious and often require a hospital stay. PICC malposition, or PICC movement or poor placement. A break or cut in the PICC. Do not use scissors near the PICC. Nerve or tendon irritation or injury during PICC insertion. How to care for your PICC Please follow the specific guidelines provided by your health care provider. Preventing infection You and any caregivers should wash your hands often with soap and water for at least 20 seconds. Wash hands: Before touching the PICC or the infusion device. Before changing a  bandage (dressing). Do not change the dressing unless you have been taught to do so and have shown you are able to change it safely. Flush the PICC as told. Tell your health care provider right away if the PICC is hard to flush or does not flush. Do not use force to flush the PICC. Use clean and germ-free (sterile) supplies only. Keep the supplies in a dry place. Do not reuse needles, syringes, or any other supplies. Reusing supplies can lead to infection. Keep the PICC dressing dry and secure it with tape if the edges stop sticking to your skin. Check your PICC insertion site every day for signs of infection. Check for: Redness, swelling, or pain. Fluid or blood. Warmth. Pus or a bad smell. Preventing other problems Do not use a syringe that is less than 10 mL to flush the PICC. Do not have your blood pressure checked on the arm in which the PICC is placed. Do not ever pull or tug on the PICC. Keep it secured to your arm with tape or a stretch wrap when not in use. Do not take the PICC out yourself. Only a trained health care provider should remove the PICC. Keep pets and children away from your PICC. How to care for your PICC dressing Keep your PICC dressing clean and dry to prevent infection. Do not take baths, swim, or use a hot tub until your health care provider approves. Ask your health care provider if you can take  showers. You may only be allowed to take sponge baths. When you are allowed to shower: Ask your health care provider to teach you how to wrap the PICC. Cover the PICC with clear plastic wrap and tape to keep it dry while showering. Follow instructions from your health care provider about how to take care of your insertion site and dressing. Make sure you: Wash your hands with soap and water for at least 20 seconds before and after you change your dressing. If soap and water are not available, use hand sanitizer. Change your dressing only if taught to do so by your health care  provider. Your PICC dressing needs to be changed if it becomes loose or wet. Leave stitches (sutures), skin glue, or adhesive strips in place. These skin closures may need to stay in place for 2 weeks or longer. If adhesive strip edges start to loosen and curl up, you may trim the loose edges. Do not remove adhesive strips completely unless your health care provider tells you to do that. Follow these instructions at home: Disposal of supplies Throw away any syringes in a disposal container that is meant for sharp items (sharps container). You can buy a sharps container from a pharmacy, or you can make one by using an empty, hard plastic bottle with a lid. Place any used dressings or infusion bags into a plastic bag. Throw that bag in the trash. General instructions  Always carry your PICC identification card or wear a medical alert bracelet. Keep the tube clamped at all times, unless it is being used. Always carry a smooth-edge clamp with you to clamp the PICC if it breaks. Do not use scissors or sharp objects near the tube. You may bend your arm and move it freely. If your PICC is near or at the bend of your elbow, avoid activity with repeated motion at the elbow. Avoid lifting heavy objects as told by your health care provider. Keep all follow-up visits. This is important. You will need to have your PICC dressing changed at least once a week. Contact a health care provider if: You have pain in your arm, ear, face, or teeth. You have a fever or chills. You have redness, swelling, or pain around the insertion site. You have fluid or blood coming from the insertion site. Your insertion site feels warm to the touch. You have pus or a bad smell coming from the insertion site. Your skin feels hard and raised around the insertion site. Your PICC dressing has gotten wet or is coming off and you have not been taught how to change it. Get help right away if: You have problems with your PICC, such as  your PICC: Was tugged or pulled and has partially come out. Do not  push the PICC back in. Cannot be flushed, is hard to flush, or leaks around the insertion site when it is flushed. Makes a flushing sound when it is flushed. Appears to have a hole or tear. Is accidentally pulled all the way out. If this happens, cover the insertion site with a gauze dressing. Do not throw the PICC away. Your health care provider will need to check it to be sure the entire catheter came out. You feel your heart racing or skipping beats, or you have chest pain. You have shortness of breath or trouble breathing. You have swelling, redness, warmth, or pain in the arm in which the PICC is placed. You have a red streak going up your arm that  starts under the PICC dressing. These symptoms may be an emergency. Get help right away. Call 911. Do not wait to see if the symptoms will go away. Do not drive yourself to the hospital. Summary A peripherally inserted central catheter (PICC) is a long, thin, flexible tube (catheter) that is put into a vein in the arm or leg. If cared for properly, a PICC can remain in place for many months. Having a PICC can allow you to go home from the hospital sooner and continue treatment at home. The PICC is inserted using a germ-free (sterile) technique by a specially trained health care provider. Only a trained health care provider should remove it. Do not have your blood pressure checked on the arm in which your PICC is placed. Always keep your PICC identification card with you. This information is not intended to replace advice given to you by your health care provider. Make sure you discuss any questions you have with your health care provider. Document Revised: 07/01/2021 Document Reviewed: 07/01/2021 Elsevier Patient Education  2024 [employer].

## 2024-07-12 ENCOUNTER — Other Ambulatory Visit: Payer: Self-pay | Admitting: Hematology & Oncology

## 2024-07-18 ENCOUNTER — Inpatient Hospital Stay

## 2024-07-18 VITALS — BP 132/53 | HR 70 | Temp 98.1°F | Resp 18

## 2024-07-18 DIAGNOSIS — C771 Secondary and unspecified malignant neoplasm of intrathoracic lymph nodes: Secondary | ICD-10-CM | POA: Diagnosis not present

## 2024-07-18 DIAGNOSIS — E538 Deficiency of other specified B group vitamins: Secondary | ICD-10-CM | POA: Diagnosis not present

## 2024-07-18 DIAGNOSIS — Z1721 Progesterone receptor positive status: Secondary | ICD-10-CM | POA: Diagnosis not present

## 2024-07-18 DIAGNOSIS — Z79899 Other long term (current) drug therapy: Secondary | ICD-10-CM | POA: Diagnosis not present

## 2024-07-18 DIAGNOSIS — Z7982 Long term (current) use of aspirin: Secondary | ICD-10-CM | POA: Diagnosis not present

## 2024-07-18 DIAGNOSIS — Z5111 Encounter for antineoplastic chemotherapy: Secondary | ICD-10-CM | POA: Diagnosis not present

## 2024-07-18 DIAGNOSIS — C7951 Secondary malignant neoplasm of bone: Secondary | ICD-10-CM | POA: Diagnosis not present

## 2024-07-18 DIAGNOSIS — Z170 Estrogen receptor positive status [ER+]: Secondary | ICD-10-CM | POA: Diagnosis not present

## 2024-07-18 DIAGNOSIS — C50911 Malignant neoplasm of unspecified site of right female breast: Secondary | ICD-10-CM | POA: Diagnosis not present

## 2024-07-18 DIAGNOSIS — T82594D Other mechanical complication of infusion catheter, subsequent encounter: Secondary | ICD-10-CM

## 2024-07-18 MED ORDER — HEPARIN SOD (PORK) LOCK FLUSH 100 UNIT/ML IV SOLN
250.0000 [IU] | Freq: Once | INTRAVENOUS | Status: AC
  Administered 2024-07-18: 250 [IU] via INTRAVENOUS

## 2024-07-18 MED ORDER — SODIUM CHLORIDE 0.9% FLUSH
10.0000 mL | Freq: Once | INTRAVENOUS | Status: AC
  Administered 2024-07-18: 10 mL via INTRAVENOUS

## 2024-07-23 ENCOUNTER — Other Ambulatory Visit: Payer: Self-pay

## 2024-07-23 ENCOUNTER — Other Ambulatory Visit: Payer: Self-pay | Admitting: Hematology & Oncology

## 2024-07-23 DIAGNOSIS — C50911 Malignant neoplasm of unspecified site of right female breast: Secondary | ICD-10-CM

## 2024-07-23 MED ORDER — RIBOCICLIB SUCC (400 MG DOSE) 200 MG PO TBPK
400.0000 mg | ORAL_TABLET | Freq: Every day | ORAL | 6 refills | Status: AC
  Filled 2024-07-23: qty 42, 28d supply, fill #0
  Filled 2024-08-21: qty 42, 28d supply, fill #1
  Filled 2024-09-18: qty 42, 28d supply, fill #2
  Filled 2024-10-15: qty 42, 28d supply, fill #3
  Filled 2024-11-12: qty 42, 28d supply, fill #4
  Filled 2024-12-10: qty 42, 28d supply, fill #5
  Filled 2025-01-07 (×2): qty 42, 28d supply, fill #6

## 2024-07-23 NOTE — Progress Notes (Signed)
 Specialty Pharmacy Refill Coordination Note  Tracy Thompson is a 73 y.o. female contacted today regarding refills of specialty medication(s) Ribociclib  Succinate (Kisqali  (400 MG Dose))   Patient requested Delivery   Delivery date: 08/01/24   Verified address: [address]  [place]   Medication will be filled on 07/31/24. This fill date is pending response to refill request from provider. Patient is aware and if they have not received fill by intended date they must follow up with pharmacy.

## 2024-07-25 ENCOUNTER — Other Ambulatory Visit: Payer: Self-pay

## 2024-07-25 ENCOUNTER — Inpatient Hospital Stay

## 2024-07-25 ENCOUNTER — Inpatient Hospital Stay: Admitting: Medical Oncology

## 2024-07-25 ENCOUNTER — Encounter: Payer: Self-pay | Admitting: Medical Oncology

## 2024-07-25 VITALS — BP 106/63 | HR 66 | Temp 98.0°F | Resp 17 | Ht 66.0 in | Wt 156.5 lb

## 2024-07-25 DIAGNOSIS — C50919 Malignant neoplasm of unspecified site of unspecified female breast: Secondary | ICD-10-CM

## 2024-07-25 DIAGNOSIS — C7951 Secondary malignant neoplasm of bone: Secondary | ICD-10-CM

## 2024-07-25 DIAGNOSIS — Z853 Personal history of malignant neoplasm of breast: Secondary | ICD-10-CM

## 2024-07-25 DIAGNOSIS — E538 Deficiency of other specified B group vitamins: Secondary | ICD-10-CM | POA: Diagnosis not present

## 2024-07-25 DIAGNOSIS — Z5111 Encounter for antineoplastic chemotherapy: Secondary | ICD-10-CM | POA: Diagnosis not present

## 2024-07-25 DIAGNOSIS — Z170 Estrogen receptor positive status [ER+]: Secondary | ICD-10-CM | POA: Diagnosis not present

## 2024-07-25 DIAGNOSIS — D7589 Other specified diseases of blood and blood-forming organs: Secondary | ICD-10-CM | POA: Diagnosis not present

## 2024-07-25 DIAGNOSIS — C771 Secondary and unspecified malignant neoplasm of intrathoracic lymph nodes: Secondary | ICD-10-CM | POA: Diagnosis not present

## 2024-07-25 DIAGNOSIS — Z7982 Long term (current) use of aspirin: Secondary | ICD-10-CM | POA: Diagnosis not present

## 2024-07-25 DIAGNOSIS — C50911 Malignant neoplasm of unspecified site of right female breast: Secondary | ICD-10-CM

## 2024-07-25 DIAGNOSIS — Z79899 Other long term (current) drug therapy: Secondary | ICD-10-CM | POA: Diagnosis not present

## 2024-07-25 DIAGNOSIS — Z1721 Progesterone receptor positive status: Secondary | ICD-10-CM | POA: Diagnosis not present

## 2024-07-25 LAB — CBC WITH DIFFERENTIAL (CANCER CENTER ONLY)
Abs Immature Granulocytes: 0.01 K/uL (ref 0.00–0.07)
Basophils Absolute: 0 K/uL (ref 0.0–0.1)
Basophils Relative: 1 %
Eosinophils Absolute: 0 K/uL (ref 0.0–0.5)
Eosinophils Relative: 1 %
HCT: 29.9 % — ABNORMAL LOW (ref 36.0–46.0)
Hemoglobin: 10.5 g/dL — ABNORMAL LOW (ref 12.0–15.0)
Immature Granulocytes: 1 %
Lymphocytes Relative: 17 %
Lymphs Abs: 0.4 K/uL — ABNORMAL LOW (ref 0.7–4.0)
MCH: 37.5 pg — ABNORMAL HIGH (ref 26.0–34.0)
MCHC: 35.1 g/dL (ref 30.0–36.0)
MCV: 106.8 fL — ABNORMAL HIGH (ref 80.0–100.0)
Monocytes Absolute: 0.3 K/uL (ref 0.1–1.0)
Monocytes Relative: 12 %
Neutro Abs: 1.5 K/uL — ABNORMAL LOW (ref 1.7–7.7)
Neutrophils Relative %: 68 %
Platelet Count: 190 K/uL (ref 150–400)
RBC: 2.8 MIL/uL — ABNORMAL LOW (ref 3.87–5.11)
RDW: 13.4 % (ref 11.5–15.5)
WBC Count: 2.2 K/uL — ABNORMAL LOW (ref 4.0–10.5)
nRBC: 0 % (ref 0.0–0.2)

## 2024-07-25 LAB — CMP (CANCER CENTER ONLY)
ALT: 17 U/L (ref 0–44)
AST: 27 U/L (ref 15–41)
Albumin: 3.7 g/dL (ref 3.5–5.0)
Alkaline Phosphatase: 65 U/L (ref 38–126)
Anion gap: 10 (ref 5–15)
BUN: 17 mg/dL (ref 8–23)
CO2: 26 mmol/L (ref 22–32)
Calcium: 8.9 mg/dL (ref 8.9–10.3)
Chloride: 104 mmol/L (ref 98–111)
Creatinine: 1.4 mg/dL — ABNORMAL HIGH (ref 0.44–1.00)
GFR, Estimated: 40 mL/min — ABNORMAL LOW (ref >60)
Glucose, Bld: 94 mg/dL (ref 70–99)
Potassium: 4 mmol/L (ref 3.5–5.1)
Sodium: 140 mmol/L (ref 135–145)
Total Bilirubin: 0.3 mg/dL (ref 0.0–1.2)
Total Protein: 6.4 g/dL — ABNORMAL LOW (ref 6.5–8.1)

## 2024-07-25 MED ORDER — FULVESTRANT 250 MG/5ML IM SOSY
500.0000 mg | PREFILLED_SYRINGE | Freq: Once | INTRAMUSCULAR | Status: AC
Start: 2024-07-25 — End: 2024-07-25
  Administered 2024-07-25: 500 mg via INTRAMUSCULAR
  Filled 2024-07-25: qty 10

## 2024-07-25 NOTE — Progress Notes (Signed)
 Hematology and Oncology Follow Up Visit  Tracy Thompson [id number] August 22, 1951 72 y.o. 07/25/2024   Principle Diagnosis:  Metastatic breast cancer-bone metastasis/hilar lymph node metastasis- ER+/PR+/HER-2 (2+) -- PIK3CA (+)  Current Therapy:   Faslodex  500 mg IM monthly -start on 11/09/2022 Ribociclib  600 mg p.o. daily (21/7) --start on 11/09/2022 --changed to 400 mg p.o. daily started on 09/05/2023 Zometa  4 mg IV every 3 months - next dose 07/2024 Radiation therapy to left hip -completed in 11/23/2022     Interim History:  Tracy Thompson is back for follow-up.    Today she states that she has been doing well. She really has had no complaints since we last saw her.   At her last visit she was asked to start B12 5,000 mcg once daily for a low B12 level and macrocytosis.   Her last CA 27.29 was 32.9  She has had no problems with pain.  She has had no problems with cough or shortness of breath.  She has had no nausea or vomiting.  She has had no bleeding.  There is been no rashes.  She has had no obvious change in bowel or bladder habits.  She has had no headache.  She is eating well. She is trying to stay hydrated. She is having a few episodes of orthostatic hypotension. Her PCP discussed stopping her BP meds.   Overall, I would have to say that her performance status is probably ECOG 1.    Wt Readings from Last 3 Encounters:  07/25/24 156 lb 8 oz (71 kg)  06/20/24 154 lb 0.6 oz (69.9 kg)  05/23/24 155 lb (70.3 kg)    Medications:  Current Outpatient Medications:    aspirin 81 MG tablet, Take 81 mg by mouth daily., Disp: , Rfl:    bisacodyl (DULCOLAX) 5 MG EC tablet, Take 5 mg by mouth daily as needed for moderate constipation., Disp: , Rfl:    bisoprolol -hydrochlorothiazide  (ZIAC ) 10-6.25 MG tablet, Take 1 tablet by mouth daily., Disp: 90 tablet, Rfl: 1   carboxymethylcellulose (REFRESH PLUS) 0.5 % SOLN, Place 1 drop into both eyes 3 (three) times daily as needed., Disp: , Rfl:     Cholecalciferol (VITAMIN D ) 50 MCG (2000 UT) CAPS, Take 2,000 Units by mouth daily., Disp: , Rfl:    Cyanocobalamin  (VITAMIN B 12 PO), Take 5,000 mcg by mouth daily., Disp: , Rfl:    fulvestrant  (FASLODEX ) 250 MG/5ML injection, Inject 500 mg into the muscle every 30 (thirty) days. One injection each buttock over 1-2 minutes. Warm prior to use., Disp: , Rfl:    Heparin  Na, Pork, Lock Flsh PF 100 UNIT/ML SOLN, Inject 2.5 mLs (250 Units total) into the vein every 12 (twelve) hours., Disp: 300 mL, Rfl: 3   meclizine  (ANTIVERT ) 12.5 MG tablet, Take 1 tablet (12.5 mg total) by mouth 3 (three) times daily as needed for dizziness., Disp: 30 tablet, Rfl: 4   OLANZapine  (ZYPREXA ) 5 MG tablet, TAKE 1 TABLET(5 MG) BY MOUTH AT BEDTIME, Disp: 30 tablet, Rfl: 3   ondansetron  (ZOFRAN ) 8 MG tablet, TAKE 1 TABLET(8 MG) BY MOUTH EVERY 8 HOURS AS NEEDED FOR NAUSEA OR VOMITING, Disp: 30 tablet, Rfl: 2   potassium chloride  SA (KLOR-CON  M) 20 MEQ tablet, Take 1 tablet (20 mEq total) by mouth 2 (two) times daily., Disp: 180 tablet, Rfl: 1   ribociclib  succ (KISQALI  400MG  DAILY DOSE) 200 MG Therapy Pack, Take 2 tablets (400 mg total) by mouth daily. Take for 21 days on, 7 days  off, repeat every 28 days., Disp: 63 tablet, Rfl: 6   sodium chloride  flush 0.9 % SOLN injection, Inject 10 mLs into the vein every 12 (twelve) hours., Disp: 600 mL, Rfl: 3   traMADol  (ULTRAM ) 50 MG tablet, Take 1 tablet (50 mg total) by mouth every 6 (six) hours as needed., Disp: 90 tablet, Rfl: 0   zoledronic  acid (ZOMETA ) 4 MG/5ML injection, Inject 4 mg into the vein every 3 (three) months., Disp: , Rfl:   Allergies: No Known Allergies  Past Medical History, Surgical history, Social history, and Family History were reviewed and updated.  Review of Systems: Review of Systems  Constitutional:  Positive for fatigue.  HENT:  Negative.    Eyes: Negative.   Respiratory: Negative.    Cardiovascular: Negative.   Gastrointestinal:  Positive for  constipation and nausea.  Endocrine: Negative.   Genitourinary: Negative.    Musculoskeletal:  Positive for arthralgias and myalgias.  Skin: Negative.   Neurological: Negative.   Hematological: Negative.   Psychiatric/Behavioral: Negative.      Physical Exam: Vitals:   07/25/24 1034  BP: 106/63  Pulse: 66  Resp: 17  Temp: 98 F (36.7 C)  SpO2: 99%   Wt Readings from Last 3 Encounters:  07/25/24 156 lb 8 oz (71 kg)  06/20/24 154 lb 0.6 oz (69.9 kg)  05/23/24 155 lb (70.3 kg)    Physical Exam Vitals reviewed.  HENT:     Head: Normocephalic and atraumatic.  Eyes:     Pupils: Pupils are equal, round, and reactive to light.  Cardiovascular:     Rate and Rhythm: Normal rate and regular rhythm.     Heart sounds: Normal heart sounds.  Pulmonary:     Effort: Pulmonary effort is normal.     Breath sounds: Normal breath sounds.  Abdominal:     General: Bowel sounds are normal.     Palpations: Abdomen is soft.  Musculoskeletal:        General: No tenderness or deformity. Normal range of motion.     Cervical back: Normal range of motion.  Lymphadenopathy:     Cervical: No cervical adenopathy.  Skin:    General: Skin is warm and dry.     Findings: No erythema or rash.  Neurological:     Mental Status: She is alert and oriented to person, place, and time.  Psychiatric:        Behavior: Behavior normal.        Thought Content: Thought content normal.        Judgment: Judgment normal.      Lab Results  Component Value Date   WBC 2.2 (L) 07/25/2024   HGB 10.5 (L) 07/25/2024   HCT 29.9 (L) 07/25/2024   MCV 106.8 (H) 07/25/2024   PLT 190 07/25/2024     Chemistry      Component Value Date/Time   NA 140 07/25/2024 1017   K 4.0 07/25/2024 1017   CL 104 07/25/2024 1017   CO2 26 07/25/2024 1017   BUN 17 07/25/2024 1017   CREATININE 1.40 (H) 07/25/2024 1017      Component Value Date/Time   CALCIUM  8.9 07/25/2024 1017   ALKPHOS 65 07/25/2024 1017   AST 27  07/25/2024 1017   ALT 17 07/25/2024 1017   BILITOT 0.3 07/25/2024 1017     Encounter Diagnoses  Name Primary?   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  She has metastatic breast cancer.  We have her on antiestrogen therapy.  So far, I think she is done well with the antiestrogen therapy. She also has B12 deficiency for which she takes B12 5,000 mcg once daily.   At her last visit her tumor marker had risen a bit. Repeat testing today.  PET scan order placed CBC overall stable CMP overall stable  PET scan order placed for August 2025 Faslodex  today RTC 1 week port flush, labs (B12, folate) RTC weekly for port flush x 3 RTC 1 month MD, port labs, Faslodex     Lauraine CHRISTELLA Dais, [place] 7/30/202511:50 AM

## 2024-07-25 NOTE — Patient Instructions (Signed)
 Fulvestrant Injection What is this medication? FULVESTRANT (ful VES trant) treats breast cancer. It works by blocking the hormone estrogen in breast tissue, which prevents breast cancer cells from spreading or growing. This medicine may be used for other purposes; ask your health care provider or pharmacist if you have questions. COMMON BRAND NAME(S): FASLODEX What should I tell my care team before I take this medication? They need to know if you have any of these conditions: Bleeding disorder Liver disease Low blood cell levels (white cells, red cells, and platelets) An unusual or allergic reaction to fulvestrant, other medications, foods, dyes, or preservatives Pregnant or trying to get pregnant Breastfeeding How should I use this medication? This medication is injected into a muscle. It is given by your care team in a hospital or clinic setting. Talk to your care team about the use of this medication in children. Special care may be needed. Overdosage: If you think you have taken too much of this medicine contact a poison control center or emergency room at once. NOTE: This medicine is only for you. Do not share this medicine with others. What if I miss a dose? Keep appointments for follow-up doses. It is important not to miss your dose. Call your care team if you are unable to keep an appointment. What may interact with this medication? Fluoroestradiol F18 This list may not describe all possible interactions. Give your health care provider a list of all the medicines, herbs, non-prescription drugs, or dietary supplements you use. Also tell them if you smoke, drink alcohol, or use illegal drugs. Some items may interact with your medicine. What should I watch for while using this medication? Your condition will be monitored carefully while you are receiving this medication. You may need blood work done while you are taking this medication. This medication is injected into a muscle. Talk  to your care team if you also take medications that prevent or treat blood clots, such as warfarin. Blood thinners may increase the risk of bleeding or bruising in the muscle where this medication is injected. The benefits of this medication may outweigh the risks. Your care team can help you find the option that works for you. They can also help limit the risk of bleeding. Talk to your care team if you may be pregnant. Serious birth defects can occur if you take this medication during pregnancy and for 1 year after the last dose. You will need a negative pregnancy test before starting this medication. Contraception is recommended while taking this medication and for 1 year after the last dose. Your care team can help you find the option that works for you. Do not breastfeed while taking this medication and for 1 year after the last dose. This medication may cause infertility. Talk to your care team if you are concerned about your fertility. What side effects may I notice from receiving this medication? Side effects that you should report to your care team as soon as possible: Allergic reactions or angioedema--skin rash, itching or hives, swelling of the face, eyes, lips, tongue, arms, or legs, trouble swallowing or breathing Pain, tingling, or numbness in the hands or feet Side effects that usually do not require medical attention (report to your care team if they continue or are bothersome): Bone, joint, or muscle pain Constipation Headache Hot flashes Nausea Pain, redness, or irritation at injection site Unusual weakness or fatigue This list may not describe all possible side effects. Call your doctor for medical advice about side  effects. You may report side effects to FDA at 1-800-FDA-1088. Where should I keep my medication? This medication is given in a hospital or clinic. It will not be stored at home. NOTE: This sheet is a summary. It may not cover all possible information. If you have  questions about this medicine, talk to your doctor, pharmacist, or health care provider.  2024 Elsevier/Gold Standard (2023-08-19 00:00:00)

## 2024-07-25 NOTE — Patient Instructions (Signed)
 PICC Home Care Guide A peripherally inserted central catheter (PICC) is a form of IV access that allows medicines and IV fluids to be quickly put into the blood and spread throughout the body. The PICC is a long, thin, flexible tube (catheter) that is put into a vein in a person's arm or leg. The catheter ends in a large vein just outside the heart called the superior vena cava (SVC). After the PICC is put in, a chest X-ray may be done to make sure that it is in the right place. A PICC may be placed for different reasons, such as: To give medicines and liquid nutrition. To give IV fluids and blood products. To take blood samples often. If there is trouble placing a peripheral intravenous (PIV) catheter. If cared for properly, a PICC can remain in place for many months. Having a PICC can allow you to go home from the hospital sooner and continue treatment at home. Medicines and PICC care can be managed at home by a family member, caregiver, or home health care team. What are the risks? Generally, having a PICC is safe. However, problems may occur, including: A blood clot (thrombus) forming in or at the end of the PICC. A blood clot forming in a vein (deep vein thrombosis) or traveling to the lung (pulmonary embolism). Inflammation of the vein (phlebitis) in which the PICC is placed. Infection at the insertion site or in the blood. Blood infections from central lines, like PICCs, can be serious and often require a hospital stay. PICC malposition, or PICC movement or poor placement. A break or cut in the PICC. Do not use scissors near the PICC. Nerve or tendon irritation or injury during PICC insertion. How to care for your PICC Please follow the specific guidelines provided by your health care provider. Preventing infection You and any caregivers should wash your hands often with soap and water for at least 20 seconds. Wash hands: Before touching the PICC or the infusion device. Before changing a  bandage (dressing). Do not change the dressing unless you have been taught to do so and have shown you are able to change it safely. Flush the PICC as told. Tell your health care provider right away if the PICC is hard to flush or does not flush. Do not use force to flush the PICC. Use clean and germ-free (sterile) supplies only. Keep the supplies in a dry place. Do not reuse needles, syringes, or any other supplies. Reusing supplies can lead to infection. Keep the PICC dressing dry and secure it with tape if the edges stop sticking to your skin. Check your PICC insertion site every day for signs of infection. Check for: Redness, swelling, or pain. Fluid or blood. Warmth. Pus or a bad smell. Preventing other problems Do not use a syringe that is less than 10 mL to flush the PICC. Do not have your blood pressure checked on the arm in which the PICC is placed. Do not ever pull or tug on the PICC. Keep it secured to your arm with tape or a stretch wrap when not in use. Do not take the PICC out yourself. Only a trained health care provider should remove the PICC. Keep pets and children away from your PICC. How to care for your PICC dressing Keep your PICC dressing clean and dry to prevent infection. Do not take baths, swim, or use a hot tub until your health care provider approves. Ask your health care provider if you can take  showers. You may only be allowed to take sponge baths. When you are allowed to shower: Ask your health care provider to teach you how to wrap the PICC. Cover the PICC with clear plastic wrap and tape to keep it dry while showering. Follow instructions from your health care provider about how to take care of your insertion site and dressing. Make sure you: Wash your hands with soap and water for at least 20 seconds before and after you change your dressing. If soap and water are not available, use hand sanitizer. Change your dressing only if taught to do so by your health care  provider. Your PICC dressing needs to be changed if it becomes loose or wet. Leave stitches (sutures), skin glue, or adhesive strips in place. These skin closures may need to stay in place for 2 weeks or longer. If adhesive strip edges start to loosen and curl up, you may trim the loose edges. Do not remove adhesive strips completely unless your health care provider tells you to do that. Follow these instructions at home: Disposal of supplies Throw away any syringes in a disposal container that is meant for sharp items (sharps container). You can buy a sharps container from a pharmacy, or you can make one by using an empty, hard plastic bottle with a lid. Place any used dressings or infusion bags into a plastic bag. Throw that bag in the trash. General instructions  Always carry your PICC identification card or wear a medical alert bracelet. Keep the tube clamped at all times, unless it is being used. Always carry a smooth-edge clamp with you to clamp the PICC if it breaks. Do not use scissors or sharp objects near the tube. You may bend your arm and move it freely. If your PICC is near or at the bend of your elbow, avoid activity with repeated motion at the elbow. Avoid lifting heavy objects as told by your health care provider. Keep all follow-up visits. This is important. You will need to have your PICC dressing changed at least once a week. Contact a health care provider if: You have pain in your arm, ear, face, or teeth. You have a fever or chills. You have redness, swelling, or pain around the insertion site. You have fluid or blood coming from the insertion site. Your insertion site feels warm to the touch. You have pus or a bad smell coming from the insertion site. Your skin feels hard and raised around the insertion site. Your PICC dressing has gotten wet or is coming off and you have not been taught how to change it. Get help right away if: You have problems with your PICC, such as  your PICC: Was tugged or pulled and has partially come out. Do not  push the PICC back in. Cannot be flushed, is hard to flush, or leaks around the insertion site when it is flushed. Makes a flushing sound when it is flushed. Appears to have a hole or tear. Is accidentally pulled all the way out. If this happens, cover the insertion site with a gauze dressing. Do not throw the PICC away. Your health care provider will need to check it to be sure the entire catheter came out. You feel your heart racing or skipping beats, or you have chest pain. You have shortness of breath or trouble breathing. You have swelling, redness, warmth, or pain in the arm in which the PICC is placed. You have a red streak going up your arm that  starts under the PICC dressing. These symptoms may be an emergency. Get help right away. Call 911. Do not wait to see if the symptoms will go away. Do not drive yourself to the hospital. Summary A peripherally inserted central catheter (PICC) is a long, thin, flexible tube (catheter) that is put into a vein in the arm or leg. If cared for properly, a PICC can remain in place for many months. Having a PICC can allow you to go home from the hospital sooner and continue treatment at home. The PICC is inserted using a germ-free (sterile) technique by a specially trained health care provider. Only a trained health care provider should remove it. Do not have your blood pressure checked on the arm in which your PICC is placed. Always keep your PICC identification card with you. This information is not intended to replace advice given to you by your health care provider. Make sure you discuss any questions you have with your health care provider. Document Revised: 07/01/2021 Document Reviewed: 07/01/2021 Elsevier Patient Education  2024 [employer].

## 2024-07-26 ENCOUNTER — Ambulatory Visit: Payer: Self-pay | Admitting: Hematology & Oncology

## 2024-07-26 ENCOUNTER — Encounter: Payer: Self-pay | Admitting: *Deleted

## 2024-07-26 LAB — CANCER ANTIGEN 27.29: CA 27.29: 25.8 U/mL (ref 0.0–38.6)

## 2024-07-30 ENCOUNTER — Other Ambulatory Visit: Payer: Self-pay | Admitting: Hematology & Oncology

## 2024-07-30 ENCOUNTER — Other Ambulatory Visit (HOSPITAL_BASED_OUTPATIENT_CLINIC_OR_DEPARTMENT_OTHER): Payer: Self-pay

## 2024-07-30 MED ORDER — SODIUM CHLORIDE FLUSH 0.9 % IV SOLN
10.0000 mL | Freq: Two times a day (BID) | INTRAVENOUS | 3 refills | Status: DC
  Filled 2024-07-30: qty 600, 30d supply, fill #0
  Filled 2024-08-27: qty 600, 30d supply, fill #1
  Filled 2024-10-01: qty 600, 30d supply, fill #2
  Filled 2024-11-05: qty 600, 30d supply, fill #3

## 2024-07-31 ENCOUNTER — Other Ambulatory Visit: Payer: Self-pay

## 2024-08-01 ENCOUNTER — Inpatient Hospital Stay: Attending: Hematology & Oncology

## 2024-08-01 ENCOUNTER — Inpatient Hospital Stay

## 2024-08-01 DIAGNOSIS — Z1721 Progesterone receptor positive status: Secondary | ICD-10-CM | POA: Diagnosis not present

## 2024-08-01 DIAGNOSIS — K5900 Constipation, unspecified: Secondary | ICD-10-CM | POA: Insufficient documentation

## 2024-08-01 DIAGNOSIS — Z170 Estrogen receptor positive status [ER+]: Secondary | ICD-10-CM | POA: Diagnosis not present

## 2024-08-01 DIAGNOSIS — M791 Myalgia, unspecified site: Secondary | ICD-10-CM | POA: Diagnosis not present

## 2024-08-01 DIAGNOSIS — C50911 Malignant neoplasm of unspecified site of right female breast: Secondary | ICD-10-CM | POA: Insufficient documentation

## 2024-08-01 DIAGNOSIS — Z7982 Long term (current) use of aspirin: Secondary | ICD-10-CM | POA: Insufficient documentation

## 2024-08-01 DIAGNOSIS — D7589 Other specified diseases of blood and blood-forming organs: Secondary | ICD-10-CM

## 2024-08-01 DIAGNOSIS — C7951 Secondary malignant neoplasm of bone: Secondary | ICD-10-CM | POA: Diagnosis not present

## 2024-08-01 DIAGNOSIS — Z79899 Other long term (current) drug therapy: Secondary | ICD-10-CM | POA: Diagnosis not present

## 2024-08-01 DIAGNOSIS — M2550 Pain in unspecified joint: Secondary | ICD-10-CM | POA: Diagnosis not present

## 2024-08-01 DIAGNOSIS — Z5111 Encounter for antineoplastic chemotherapy: Secondary | ICD-10-CM | POA: Diagnosis not present

## 2024-08-01 DIAGNOSIS — E538 Deficiency of other specified B group vitamins: Secondary | ICD-10-CM | POA: Diagnosis not present

## 2024-08-01 DIAGNOSIS — R110 Nausea: Secondary | ICD-10-CM | POA: Insufficient documentation

## 2024-08-01 DIAGNOSIS — C771 Secondary and unspecified malignant neoplasm of intrathoracic lymph nodes: Secondary | ICD-10-CM | POA: Insufficient documentation

## 2024-08-01 DIAGNOSIS — R5383 Other fatigue: Secondary | ICD-10-CM | POA: Diagnosis not present

## 2024-08-01 LAB — FOLATE: Folate: 7 ng/mL (ref >5.9)

## 2024-08-01 LAB — VITAMIN B12: Vitamin B-12: 5478 pg/mL — ABNORMAL HIGH (ref 180–914)

## 2024-08-02 ENCOUNTER — Ambulatory Visit: Payer: Self-pay | Admitting: Medical Oncology

## 2024-08-06 ENCOUNTER — Encounter (HOSPITAL_COMMUNITY)
Admission: RE | Admit: 2024-08-06 | Discharge: 2024-08-06 | Disposition: A | Discharge status: Home / Self Care | Source: Ambulatory Visit | Attending: Medical Oncology | Admitting: Medical Oncology

## 2024-08-06 DIAGNOSIS — C7951 Secondary malignant neoplasm of bone: Secondary | ICD-10-CM | POA: Diagnosis not present

## 2024-08-06 DIAGNOSIS — C50919 Malignant neoplasm of unspecified site of unspecified female breast: Secondary | ICD-10-CM | POA: Insufficient documentation

## 2024-08-06 DIAGNOSIS — Z853 Personal history of malignant neoplasm of breast: Secondary | ICD-10-CM | POA: Insufficient documentation

## 2024-08-06 DIAGNOSIS — R918 Other nonspecific abnormal finding of lung field: Secondary | ICD-10-CM | POA: Diagnosis not present

## 2024-08-06 DIAGNOSIS — C50911 Malignant neoplasm of unspecified site of right female breast: Secondary | ICD-10-CM | POA: Insufficient documentation

## 2024-08-06 LAB — GLUCOSE, CAPILLARY: Glucose-Capillary: 82 mg/dL (ref 70–99)

## 2024-08-06 MED ORDER — FLUDEOXYGLUCOSE F - 18 (FDG) INJECTION
7.5900 | Freq: Once | INTRAVENOUS | Status: AC | PRN
  Administered 2024-08-06 (×2): 7.59 via INTRAVENOUS

## 2024-08-08 ENCOUNTER — Inpatient Hospital Stay

## 2024-08-08 DIAGNOSIS — C7951 Secondary malignant neoplasm of bone: Secondary | ICD-10-CM | POA: Diagnosis not present

## 2024-08-08 DIAGNOSIS — Z170 Estrogen receptor positive status [ER+]: Secondary | ICD-10-CM | POA: Diagnosis not present

## 2024-08-08 DIAGNOSIS — C50911 Malignant neoplasm of unspecified site of right female breast: Secondary | ICD-10-CM | POA: Diagnosis not present

## 2024-08-08 DIAGNOSIS — R110 Nausea: Secondary | ICD-10-CM | POA: Diagnosis not present

## 2024-08-08 DIAGNOSIS — Z5111 Encounter for antineoplastic chemotherapy: Secondary | ICD-10-CM | POA: Diagnosis not present

## 2024-08-08 DIAGNOSIS — Z1721 Progesterone receptor positive status: Secondary | ICD-10-CM | POA: Diagnosis not present

## 2024-08-08 DIAGNOSIS — C771 Secondary and unspecified malignant neoplasm of intrathoracic lymph nodes: Secondary | ICD-10-CM | POA: Diagnosis not present

## 2024-08-08 DIAGNOSIS — K5900 Constipation, unspecified: Secondary | ICD-10-CM | POA: Diagnosis not present

## 2024-08-08 DIAGNOSIS — R5383 Other fatigue: Secondary | ICD-10-CM | POA: Diagnosis not present

## 2024-08-08 NOTE — Patient Instructions (Signed)
 PICC Home Care Guide A peripherally inserted central catheter (PICC) is a form of IV access that allows medicines and IV fluids to be quickly put into the blood and spread throughout the body. The PICC is a long, thin, flexible tube (catheter) that is put into a vein in a person's arm or leg. The catheter ends in a large vein just outside the heart called the superior vena cava (SVC). After the PICC is put in, a chest X-ray may be done to make sure that it is in the right place. A PICC may be placed for different reasons, such as: To give medicines and liquid nutrition. To give IV fluids and blood products. To take blood samples often. If there is trouble placing a peripheral intravenous (PIV) catheter. If cared for properly, a PICC can remain in place for many months. Having a PICC can allow you to go home from the hospital sooner and continue treatment at home. Medicines and PICC care can be managed at home by a family member, caregiver, or home health care team. What are the risks? Generally, having a PICC is safe. However, problems may occur, including: A blood clot (thrombus) forming in or at the end of the PICC. A blood clot forming in a vein (deep vein thrombosis) or traveling to the lung (pulmonary embolism). Inflammation of the vein (phlebitis) in which the PICC is placed. Infection at the insertion site or in the blood. Blood infections from central lines, like PICCs, can be serious and often require a hospital stay. PICC malposition, or PICC movement or poor placement. A break or cut in the PICC. Do not use scissors near the PICC. Nerve or tendon irritation or injury during PICC insertion. How to care for your PICC Please follow the specific guidelines provided by your health care provider. Preventing infection You and any caregivers should wash your hands often with soap and water for at least 20 seconds. Wash hands: Before touching the PICC or the infusion device. Before changing a  bandage (dressing). Do not change the dressing unless you have been taught to do so and have shown you are able to change it safely. Flush the PICC as told. Tell your health care provider right away if the PICC is hard to flush or does not flush. Do not use force to flush the PICC. Use clean and germ-free (sterile) supplies only. Keep the supplies in a dry place. Do not reuse needles, syringes, or any other supplies. Reusing supplies can lead to infection. Keep the PICC dressing dry and secure it with tape if the edges stop sticking to your skin. Check your PICC insertion site every day for signs of infection. Check for: Redness, swelling, or pain. Fluid or blood. Warmth. Pus or a bad smell. Preventing other problems Do not use a syringe that is less than 10 mL to flush the PICC. Do not have your blood pressure checked on the arm in which the PICC is placed. Do not ever pull or tug on the PICC. Keep it secured to your arm with tape or a stretch wrap when not in use. Do not take the PICC out yourself. Only a trained health care provider should remove the PICC. Keep pets and children away from your PICC. How to care for your PICC dressing Keep your PICC dressing clean and dry to prevent infection. Do not take baths, swim, or use a hot tub until your health care provider approves. Ask your health care provider if you can take  showers. You may only be allowed to take sponge baths. When you are allowed to shower: Ask your health care provider to teach you how to wrap the PICC. Cover the PICC with clear plastic wrap and tape to keep it dry while showering. Follow instructions from your health care provider about how to take care of your insertion site and dressing. Make sure you: Wash your hands with soap and water for at least 20 seconds before and after you change your dressing. If soap and water are not available, use hand sanitizer. Change your dressing only if taught to do so by your health care  provider. Your PICC dressing needs to be changed if it becomes loose or wet. Leave stitches (sutures), skin glue, or adhesive strips in place. These skin closures may need to stay in place for 2 weeks or longer. If adhesive strip edges start to loosen and curl up, you may trim the loose edges. Do not remove adhesive strips completely unless your health care provider tells you to do that. Follow these instructions at home: Disposal of supplies Throw away any syringes in a disposal container that is meant for sharp items (sharps container). You can buy a sharps container from a pharmacy, or you can make one by using an empty, hard plastic bottle with a lid. Place any used dressings or infusion bags into a plastic bag. Throw that bag in the trash. General instructions  Always carry your PICC identification card or wear a medical alert bracelet. Keep the tube clamped at all times, unless it is being used. Always carry a smooth-edge clamp with you to clamp the PICC if it breaks. Do not use scissors or sharp objects near the tube. You may bend your arm and move it freely. If your PICC is near or at the bend of your elbow, avoid activity with repeated motion at the elbow. Avoid lifting heavy objects as told by your health care provider. Keep all follow-up visits. This is important. You will need to have your PICC dressing changed at least once a week. Contact a health care provider if: You have pain in your arm, ear, face, or teeth. You have a fever or chills. You have redness, swelling, or pain around the insertion site. You have fluid or blood coming from the insertion site. Your insertion site feels warm to the touch. You have pus or a bad smell coming from the insertion site. Your skin feels hard and raised around the insertion site. Your PICC dressing has gotten wet or is coming off and you have not been taught how to change it. Get help right away if: You have problems with your PICC, such as  your PICC: Was tugged or pulled and has partially come out. Do not  push the PICC back in. Cannot be flushed, is hard to flush, or leaks around the insertion site when it is flushed. Makes a flushing sound when it is flushed. Appears to have a hole or tear. Is accidentally pulled all the way out. If this happens, cover the insertion site with a gauze dressing. Do not throw the PICC away. Your health care provider will need to check it to be sure the entire catheter came out. You feel your heart racing or skipping beats, or you have chest pain. You have shortness of breath or trouble breathing. You have swelling, redness, warmth, or pain in the arm in which the PICC is placed. You have a red streak going up your arm that  starts under the PICC dressing. These symptoms may be an emergency. Get help right away. Call 911. Do not wait to see if the symptoms will go away. Do not drive yourself to the hospital. Summary A peripherally inserted central catheter (PICC) is a long, thin, flexible tube (catheter) that is put into a vein in the arm or leg. If cared for properly, a PICC can remain in place for many months. Having a PICC can allow you to go home from the hospital sooner and continue treatment at home. The PICC is inserted using a germ-free (sterile) technique by a specially trained health care provider. Only a trained health care provider should remove it. Do not have your blood pressure checked on the arm in which your PICC is placed. Always keep your PICC identification card with you. This information is not intended to replace advice given to you by your health care provider. Make sure you discuss any questions you have with your health care provider. Document Revised: 07/01/2021 Document Reviewed: 07/01/2021 Elsevier Patient Education  2024 [employer].

## 2024-08-15 ENCOUNTER — Inpatient Hospital Stay

## 2024-08-15 DIAGNOSIS — R110 Nausea: Secondary | ICD-10-CM | POA: Diagnosis not present

## 2024-08-15 DIAGNOSIS — C50911 Malignant neoplasm of unspecified site of right female breast: Secondary | ICD-10-CM | POA: Diagnosis not present

## 2024-08-15 DIAGNOSIS — C771 Secondary and unspecified malignant neoplasm of intrathoracic lymph nodes: Secondary | ICD-10-CM | POA: Diagnosis not present

## 2024-08-15 DIAGNOSIS — K5900 Constipation, unspecified: Secondary | ICD-10-CM | POA: Diagnosis not present

## 2024-08-15 DIAGNOSIS — C7951 Secondary malignant neoplasm of bone: Secondary | ICD-10-CM | POA: Diagnosis not present

## 2024-08-15 DIAGNOSIS — Z170 Estrogen receptor positive status [ER+]: Secondary | ICD-10-CM | POA: Diagnosis not present

## 2024-08-15 DIAGNOSIS — R5383 Other fatigue: Secondary | ICD-10-CM | POA: Diagnosis not present

## 2024-08-15 DIAGNOSIS — Z5111 Encounter for antineoplastic chemotherapy: Secondary | ICD-10-CM | POA: Diagnosis not present

## 2024-08-15 DIAGNOSIS — Z1721 Progesterone receptor positive status: Secondary | ICD-10-CM | POA: Diagnosis not present

## 2024-08-15 NOTE — Patient Instructions (Signed)
 PICC Home Care Guide A peripherally inserted central catheter (PICC) is a form of IV access that allows medicines and IV fluids to be quickly put into the blood and spread throughout the body. The PICC is a long, thin, flexible tube (catheter) that is put into a vein in a person's arm or leg. The catheter ends in a large vein just outside the heart called the superior vena cava (SVC). After the PICC is put in, a chest X-ray may be done to make sure that it is in the right place. A PICC may be placed for different reasons, such as: To give medicines and liquid nutrition. To give IV fluids and blood products. To take blood samples often. If there is trouble placing a peripheral intravenous (PIV) catheter. If cared for properly, a PICC can remain in place for many months. Having a PICC can allow you to go home from the hospital sooner and continue treatment at home. Medicines and PICC care can be managed at home by a family member, caregiver, or home health care team. What are the risks? Generally, having a PICC is safe. However, problems may occur, including: A blood clot (thrombus) forming in or at the end of the PICC. A blood clot forming in a vein (deep vein thrombosis) or traveling to the lung (pulmonary embolism). Inflammation of the vein (phlebitis) in which the PICC is placed. Infection at the insertion site or in the blood. Blood infections from central lines, like PICCs, can be serious and often require a hospital stay. PICC malposition, or PICC movement or poor placement. A break or cut in the PICC. Do not use scissors near the PICC. Nerve or tendon irritation or injury during PICC insertion. How to care for your PICC Please follow the specific guidelines provided by your health care provider. Preventing infection You and any caregivers should wash your hands often with soap and water for at least 20 seconds. Wash hands: Before touching the PICC or the infusion device. Before changing a  bandage (dressing). Do not change the dressing unless you have been taught to do so and have shown you are able to change it safely. Flush the PICC as told. Tell your health care provider right away if the PICC is hard to flush or does not flush. Do not use force to flush the PICC. Use clean and germ-free (sterile) supplies only. Keep the supplies in a dry place. Do not reuse needles, syringes, or any other supplies. Reusing supplies can lead to infection. Keep the PICC dressing dry and secure it with tape if the edges stop sticking to your skin. Check your PICC insertion site every day for signs of infection. Check for: Redness, swelling, or pain. Fluid or blood. Warmth. Pus or a bad smell. Preventing other problems Do not use a syringe that is less than 10 mL to flush the PICC. Do not have your blood pressure checked on the arm in which the PICC is placed. Do not ever pull or tug on the PICC. Keep it secured to your arm with tape or a stretch wrap when not in use. Do not take the PICC out yourself. Only a trained health care provider should remove the PICC. Keep pets and children away from your PICC. How to care for your PICC dressing Keep your PICC dressing clean and dry to prevent infection. Do not take baths, swim, or use a hot tub until your health care provider approves. Ask your health care provider if you can take  showers. You may only be allowed to take sponge baths. When you are allowed to shower: Ask your health care provider to teach you how to wrap the PICC. Cover the PICC with clear plastic wrap and tape to keep it dry while showering. Follow instructions from your health care provider about how to take care of your insertion site and dressing. Make sure you: Wash your hands with soap and water for at least 20 seconds before and after you change your dressing. If soap and water are not available, use hand sanitizer. Change your dressing only if taught to do so by your health care  provider. Your PICC dressing needs to be changed if it becomes loose or wet. Leave stitches (sutures), skin glue, or adhesive strips in place. These skin closures may need to stay in place for 2 weeks or longer. If adhesive strip edges start to loosen and curl up, you may trim the loose edges. Do not remove adhesive strips completely unless your health care provider tells you to do that. Follow these instructions at home: Disposal of supplies Throw away any syringes in a disposal container that is meant for sharp items (sharps container). You can buy a sharps container from a pharmacy, or you can make one by using an empty, hard plastic bottle with a lid. Place any used dressings or infusion bags into a plastic bag. Throw that bag in the trash. General instructions  Always carry your PICC identification card or wear a medical alert bracelet. Keep the tube clamped at all times, unless it is being used. Always carry a smooth-edge clamp with you to clamp the PICC if it breaks. Do not use scissors or sharp objects near the tube. You may bend your arm and move it freely. If your PICC is near or at the bend of your elbow, avoid activity with repeated motion at the elbow. Avoid lifting heavy objects as told by your health care provider. Keep all follow-up visits. This is important. You will need to have your PICC dressing changed at least once a week. Contact a health care provider if: You have pain in your arm, ear, face, or teeth. You have a fever or chills. You have redness, swelling, or pain around the insertion site. You have fluid or blood coming from the insertion site. Your insertion site feels warm to the touch. You have pus or a bad smell coming from the insertion site. Your skin feels hard and raised around the insertion site. Your PICC dressing has gotten wet or is coming off and you have not been taught how to change it. Get help right away if: You have problems with your PICC, such as  your PICC: Was tugged or pulled and has partially come out. Do not  push the PICC back in. Cannot be flushed, is hard to flush, or leaks around the insertion site when it is flushed. Makes a flushing sound when it is flushed. Appears to have a hole or tear. Is accidentally pulled all the way out. If this happens, cover the insertion site with a gauze dressing. Do not throw the PICC away. Your health care provider will need to check it to be sure the entire catheter came out. You feel your heart racing or skipping beats, or you have chest pain. You have shortness of breath or trouble breathing. You have swelling, redness, warmth, or pain in the arm in which the PICC is placed. You have a red streak going up your arm that  starts under the PICC dressing. These symptoms may be an emergency. Get help right away. Call 911. Do not wait to see if the symptoms will go away. Do not drive yourself to the hospital. Summary A peripherally inserted central catheter (PICC) is a long, thin, flexible tube (catheter) that is put into a vein in the arm or leg. If cared for properly, a PICC can remain in place for many months. Having a PICC can allow you to go home from the hospital sooner and continue treatment at home. The PICC is inserted using a germ-free (sterile) technique by a specially trained health care provider. Only a trained health care provider should remove it. Do not have your blood pressure checked on the arm in which your PICC is placed. Always keep your PICC identification card with you. This information is not intended to replace advice given to you by your health care provider. Make sure you discuss any questions you have with your health care provider. Document Revised: 07/01/2021 Document Reviewed: 07/01/2021 Elsevier Patient Education  2024 [employer].

## 2024-08-17 ENCOUNTER — Other Ambulatory Visit: Payer: Self-pay | Admitting: Family Medicine

## 2024-08-17 DIAGNOSIS — I10 Essential (primary) hypertension: Secondary | ICD-10-CM

## 2024-08-21 ENCOUNTER — Other Ambulatory Visit: Payer: Self-pay

## 2024-08-21 ENCOUNTER — Other Ambulatory Visit: Payer: Self-pay | Admitting: *Deleted

## 2024-08-21 ENCOUNTER — Other Ambulatory Visit: Payer: Self-pay | Admitting: Pharmacy Technician

## 2024-08-21 DIAGNOSIS — E538 Deficiency of other specified B group vitamins: Secondary | ICD-10-CM

## 2024-08-21 DIAGNOSIS — C50919 Malignant neoplasm of unspecified site of unspecified female breast: Secondary | ICD-10-CM

## 2024-08-21 DIAGNOSIS — D500 Iron deficiency anemia secondary to blood loss (chronic): Secondary | ICD-10-CM

## 2024-08-21 NOTE — Progress Notes (Signed)
 Specialty Pharmacy Refill Coordination Note  Tracy Thompson is a 73 y.o. female contacted today regarding refills of specialty medication(s) Ribociclib  Succinate (KISQALI  400mg  Daily Dose)   Patient requested Delivery   Delivery date: 08/28/24   Verified address: [address]  [place] [id number]   Medication will be filled on 08/24/24.  Patient will go on break from 9.1-9.7 and will resume on 9/8.   (8.29) Called to confirm with patient new delivery date of 9/3.

## 2024-08-22 ENCOUNTER — Inpatient Hospital Stay

## 2024-08-22 ENCOUNTER — Inpatient Hospital Stay: Admitting: Hematology & Oncology

## 2024-08-22 ENCOUNTER — Encounter: Payer: Self-pay | Admitting: Hematology & Oncology

## 2024-08-22 VITALS — BP 116/60 | HR 64 | Temp 98.0°F | Resp 18 | Ht 66.0 in | Wt 157.1 lb

## 2024-08-22 DIAGNOSIS — C50911 Malignant neoplasm of unspecified site of right female breast: Secondary | ICD-10-CM | POA: Diagnosis not present

## 2024-08-22 DIAGNOSIS — C50919 Malignant neoplasm of unspecified site of unspecified female breast: Secondary | ICD-10-CM

## 2024-08-22 DIAGNOSIS — K5900 Constipation, unspecified: Secondary | ICD-10-CM | POA: Diagnosis not present

## 2024-08-22 DIAGNOSIS — R5383 Other fatigue: Secondary | ICD-10-CM | POA: Diagnosis not present

## 2024-08-22 DIAGNOSIS — R110 Nausea: Secondary | ICD-10-CM | POA: Diagnosis not present

## 2024-08-22 DIAGNOSIS — D500 Iron deficiency anemia secondary to blood loss (chronic): Secondary | ICD-10-CM

## 2024-08-22 DIAGNOSIS — C7951 Secondary malignant neoplasm of bone: Secondary | ICD-10-CM | POA: Diagnosis not present

## 2024-08-22 DIAGNOSIS — Z5111 Encounter for antineoplastic chemotherapy: Secondary | ICD-10-CM | POA: Diagnosis not present

## 2024-08-22 DIAGNOSIS — C771 Secondary and unspecified malignant neoplasm of intrathoracic lymph nodes: Secondary | ICD-10-CM | POA: Diagnosis not present

## 2024-08-22 DIAGNOSIS — Z170 Estrogen receptor positive status [ER+]: Secondary | ICD-10-CM | POA: Diagnosis not present

## 2024-08-22 DIAGNOSIS — Z1721 Progesterone receptor positive status: Secondary | ICD-10-CM | POA: Diagnosis not present

## 2024-08-22 DIAGNOSIS — E538 Deficiency of other specified B group vitamins: Secondary | ICD-10-CM

## 2024-08-22 LAB — CBC WITH DIFFERENTIAL (CANCER CENTER ONLY)
Abs Immature Granulocytes: 0.02 K/uL (ref 0.00–0.07)
Basophils Absolute: 0 K/uL (ref 0.0–0.1)
Basophils Relative: 1 %
Eosinophils Absolute: 0 K/uL (ref 0.0–0.5)
Eosinophils Relative: 1 %
HCT: 29.6 % — ABNORMAL LOW (ref 36.0–46.0)
Hemoglobin: 10.3 g/dL — ABNORMAL LOW (ref 12.0–15.0)
Immature Granulocytes: 1 %
Lymphocytes Relative: 19 %
Lymphs Abs: 0.4 K/uL — ABNORMAL LOW (ref 0.7–4.0)
MCH: 37.6 pg — ABNORMAL HIGH (ref 26.0–34.0)
MCHC: 34.8 g/dL (ref 30.0–36.0)
MCV: 108 fL — ABNORMAL HIGH (ref 80.0–100.0)
Monocytes Absolute: 0.3 K/uL (ref 0.1–1.0)
Monocytes Relative: 13 %
Neutro Abs: 1.4 K/uL — ABNORMAL LOW (ref 1.7–7.7)
Neutrophils Relative %: 65 %
Platelet Count: 190 K/uL (ref 150–400)
RBC: 2.74 MIL/uL — ABNORMAL LOW (ref 3.87–5.11)
RDW: 13.4 % (ref 11.5–15.5)
Smear Review: NORMAL
WBC Count: 2.1 K/uL — ABNORMAL LOW (ref 4.0–10.5)
nRBC: 0 % (ref 0.0–0.2)

## 2024-08-22 LAB — CMP (CANCER CENTER ONLY)
ALT: 17 U/L (ref 0–44)
AST: 33 U/L (ref 15–41)
Albumin: 3.6 g/dL (ref 3.5–5.0)
Alkaline Phosphatase: 64 U/L (ref 38–126)
Anion gap: 11 (ref 5–15)
BUN: 16 mg/dL (ref 8–23)
CO2: 26 mmol/L (ref 22–32)
Calcium: 8.9 mg/dL (ref 8.9–10.3)
Chloride: 103 mmol/L (ref 98–111)
Creatinine: 1.48 mg/dL — ABNORMAL HIGH (ref 0.44–1.00)
GFR, Estimated: 37 mL/min — ABNORMAL LOW (ref >60)
Glucose, Bld: 100 mg/dL — ABNORMAL HIGH (ref 70–99)
Potassium: 4.1 mmol/L (ref 3.5–5.1)
Sodium: 140 mmol/L (ref 135–145)
Total Bilirubin: 0.4 mg/dL (ref 0.0–1.2)
Total Protein: 6.3 g/dL — ABNORMAL LOW (ref 6.5–8.1)

## 2024-08-22 LAB — VITAMIN B12: Vitamin B-12: 2068 pg/mL — ABNORMAL HIGH (ref 180–914)

## 2024-08-22 LAB — FOLATE: Folate: 7.1 ng/mL (ref >5.9)

## 2024-08-22 MED ORDER — SODIUM CHLORIDE 0.9 % IV SOLN: Freq: Once | INTRAVENOUS | Status: AC

## 2024-08-22 MED ORDER — FULVESTRANT 250 MG/5ML IM SOSY
500.0000 mg | PREFILLED_SYRINGE | Freq: Once | INTRAMUSCULAR | Status: AC
Start: 2024-08-22 — End: 2024-08-22
  Administered 2024-08-22: 500 mg via INTRAMUSCULAR
  Filled 2024-08-22: qty 10

## 2024-08-22 MED ORDER — ZOLEDRONIC ACID 4 MG/100ML IV SOLN
4.0000 mg | Freq: Once | INTRAVENOUS | Status: AC
  Administered 2024-08-22: 4 mg via INTRAVENOUS
  Filled 2024-08-22: qty 100

## 2024-08-22 NOTE — Patient Instructions (Signed)
 Fulvestrant Injection What is this medication? FULVESTRANT (ful VES trant) treats breast cancer. It works by blocking the hormone estrogen in breast tissue, which prevents breast cancer cells from spreading or growing. This medicine may be used for other purposes; ask your health care provider or pharmacist if you have questions. COMMON BRAND NAME(S): FASLODEX What should I tell my care team before I take this medication? They need to know if you have any of these conditions: Bleeding disorder Liver disease Low blood cell levels (white cells, red cells, and platelets) An unusual or allergic reaction to fulvestrant, other medications, foods, dyes, or preservatives Pregnant or trying to get pregnant Breastfeeding How should I use this medication? This medication is injected into a muscle. It is given by your care team in a hospital or clinic setting. Talk to your care team about the use of this medication in children. Special care may be needed. Overdosage: If you think you have taken too much of this medicine contact a poison control center or emergency room at once. NOTE: This medicine is only for you. Do not share this medicine with others. What if I miss a dose? Keep appointments for follow-up doses. It is important not to miss your dose. Call your care team if you are unable to keep an appointment. What may interact with this medication? Fluoroestradiol F18 This list may not describe all possible interactions. Give your health care provider a list of all the medicines, herbs, non-prescription drugs, or dietary supplements you use. Also tell them if you smoke, drink alcohol, or use illegal drugs. Some items may interact with your medicine. What should I watch for while using this medication? Your condition will be monitored carefully while you are receiving this medication. You may need blood work done while you are taking this medication. This medication is injected into a muscle. Talk  to your care team if you also take medications that prevent or treat blood clots, such as warfarin. Blood thinners may increase the risk of bleeding or bruising in the muscle where this medication is injected. The benefits of this medication may outweigh the risks. Your care team can help you find the option that works for you. They can also help limit the risk of bleeding. Talk to your care team if you may be pregnant. Serious birth defects can occur if you take this medication during pregnancy and for 1 year after the last dose. You will need a negative pregnancy test before starting this medication. Contraception is recommended while taking this medication and for 1 year after the last dose. Your care team can help you find the option that works for you. Do not breastfeed while taking this medication and for 1 year after the last dose. This medication may cause infertility. Talk to your care team if you are concerned about your fertility. What side effects may I notice from receiving this medication? Side effects that you should report to your care team as soon as possible: Allergic reactions or angioedema--skin rash, itching or hives, swelling of the face, eyes, lips, tongue, arms, or legs, trouble swallowing or breathing Pain, tingling, or numbness in the hands or feet Side effects that usually do not require medical attention (report to your care team if they continue or are bothersome): Bone, joint, or muscle pain Constipation Headache Hot flashes Nausea Pain, redness, or irritation at injection site Unusual weakness or fatigue This list may not describe all possible side effects. Call your doctor for medical advice about side  effects. You may report side effects to FDA at 1-800-FDA-1088. Where should I keep my medication? This medication is given in a hospital or clinic. It will not be stored at home. NOTE: This sheet is a summary. It may not cover all possible information. If you have  questions about this medicine, talk to your doctor, pharmacist, or health care provider.  2024 Elsevier/Gold Standard (2023-08-19 00:00:00)  Zoledronic Acid Injection (Cancer) What is this medication? ZOLEDRONIC ACID (ZOE le dron ik AS id) treats high calcium levels in the blood caused by cancer. It may also be used with chemotherapy to treat weakened bones caused by cancer. It works by slowing down the release of calcium from bones. This lowers calcium levels in your blood. It also makes your bones stronger and less likely to break (fracture). It belongs to a group of medications called bisphosphonates. This medicine may be used for other purposes; ask your health care provider or pharmacist if you have questions. COMMON BRAND NAME(S): Zometa, Zometa Powder What should I tell my care team before I take this medication? They need to know if you have any of these conditions: Dehydration Dental disease Kidney disease Liver disease Low levels of calcium in the blood Lung or breathing disease, such as asthma Receiving steroids, such as dexamethasone or prednisone An unusual or allergic reaction to zoledronic acid, other medications, foods, dyes, or preservatives Pregnant or trying to get pregnant Breast-feeding How should I use this medication? This medication is injected into a vein. It is given by your care team in a hospital or clinic setting. Talk to your care team about the use of this medication in children. Special care may be needed. Overdosage: If you think you have taken too much of this medicine contact a poison control center or emergency room at once. NOTE: This medicine is only for you. Do not share this medicine with others. What if I miss a dose? Keep appointments for follow-up doses. It is important not to miss your dose. Call your care team if you are unable to keep an appointment. What may interact with this medication? Certain antibiotics given by injection Diuretics,  such as bumetanide, furosemide NSAIDs, medications for pain and inflammation, such as ibuprofen or naproxen Teriparatide Thalidomide This list may not describe all possible interactions. Give your health care provider a list of all the medicines, herbs, non-prescription drugs, or dietary supplements you use. Also tell them if you smoke, drink alcohol, or use illegal drugs. Some items may interact with your medicine. What should I watch for while using this medication? Visit your care team for regular checks on your progress. It may be some time before you see the benefit from this medication. Some people who take this medication have severe bone, joint, or muscle pain. This medication may also increase your risk for jaw problems or a broken thigh bone. Tell your care team right away if you have severe pain in your jaw, bones, joints, or muscles. Tell you care team if you have any pain that does not go away or that gets worse. Tell your dentist and dental surgeon that you are taking this medication. You should not have major dental surgery while on this medication. See your dentist to have a dental exam and fix any dental problems before starting this medication. Take good care of your teeth while on this medication. Make sure you see your dentist for regular follow-up appointments. You should make sure you get enough calcium and vitamin D while  you are taking this medication. Discuss the foods you eat and the vitamins you take with your care team. Check with your care team if you have severe diarrhea, nausea, and vomiting, or if you sweat a lot. The loss of too much body fluid may make it dangerous for you to take this medication. You may need bloodwork while taking this medication. Talk to your care team if you wish to become pregnant or think you might be pregnant. This medication can cause serious birth defects. What side effects may I notice from receiving this medication? Side effects that you  should report to your care team as soon as possible: Allergic reactions--skin rash, itching, hives, swelling of the face, lips, tongue, or throat Kidney injury--decrease in the amount of urine, swelling of the ankles, hands, or feet Low calcium level--muscle pain or cramps, confusion, tingling, or numbness in the hands or feet Osteonecrosis of the jaw--pain, swelling, or redness in the mouth, numbness of the jaw, poor healing after dental work, unusual discharge from the mouth, visible bones in the mouth Severe bone, joint, or muscle pain Side effects that usually do not require medical attention (report to your care team if they continue or are bothersome): Constipation Fatigue Fever Loss of appetite Nausea Stomach pain This list may not describe all possible side effects. Call your doctor for medical advice about side effects. You may report side effects to FDA at 1-800-FDA-1088. Where should I keep my medication? This medication is given in a hospital or clinic. It will not be stored at home. NOTE: This sheet is a summary. It may not cover all possible information. If you have questions about this medicine, talk to your doctor, pharmacist, or health care provider.  2024 Elsevier/Gold Standard (2022-02-05 00:00:00)

## 2024-08-22 NOTE — Progress Notes (Signed)
 Hematology and Oncology Follow Up Visit  Tracy Thompson [id number] 20-Nov-1951 73 y.o. 08/22/2024   Principle Diagnosis:  Metastatic breast cancer-bone metastasis/hilar lymph node metastasis- ER+/PR+/HER-2 (2+) -- PIK3CA (+)  Current Therapy:   Faslodex  500 mg IM monthly -start on 11/09/2022 Ribociclib  600 mg p.o. daily (21/7) --start on 11/09/2022 --changed to 400 mg p.o. daily started on 09/05/2023 Zometa  4 mg IV every 3 months - next dose 10/2024 Radiation therapy to left hip -completed in 11/23/2022     Interim History:  Tracy Thompson is back for follow-up.  Overall, she is doing quite well.  We last saw her a month ago.  She is having no problems with the ribociclib .  She has had no nausea or vomiting.  She has had no bleeding.  She has had no fever.  She has had no change in bowel or bladder habits.  We did go ahead and do a PET scan on her.  This was done on 08/06/2024.  Thankfully, the PET scan did not show any evidence of active breast cancer.  Her last CA 27.29 was holding steady at 26.  She has had no bleeding.  There has been no leg swelling.  She has had no headache.  There is been no mouth sores.  Her B12 level has come up quite nicely with oral B12.  When we checked it back in 3 weeks ago, her B12 level was about 5500 pg/mL.  Currently, I would say that her performance status is probably ECOG 1.   Wt Readings from Last 3 Encounters:  08/22/24 157 lb 1.9 oz (71.3 kg)  07/25/24 156 lb 8 oz (71 kg)  06/20/24 154 lb 0.6 oz (69.9 kg)    Medications:  Current Outpatient Medications:    aspirin 81 MG tablet, Take 81 mg by mouth daily., Disp: , Rfl:    bisacodyl (DULCOLAX) 5 MG EC tablet, Take 5 mg by mouth daily as needed for moderate constipation., Disp: , Rfl:    bisoprolol -hydrochlorothiazide  (ZIAC ) 10-6.25 MG tablet, Take 1 tablet by mouth daily., Disp: 90 tablet, Rfl: 0   carboxymethylcellulose (REFRESH PLUS) 0.5 % SOLN, Place 1 drop into both eyes 3 (three) times daily  as needed., Disp: , Rfl:    Cholecalciferol (VITAMIN D ) 50 MCG (2000 UT) CAPS, Take 2,000 Units by mouth daily., Disp: , Rfl:    Cyanocobalamin  (VITAMIN B 12) 500 MCG TABS, Take 1,000 mcg by mouth daily., Disp: , Rfl:    fulvestrant  (FASLODEX ) 250 MG/5ML injection, Inject 500 mg into the muscle every 30 (thirty) days. One injection each buttock over 1-2 minutes. Warm prior to use., Disp: , Rfl:    Heparin  Na, Pork, Lock Flsh PF 100 UNIT/ML SOLN, Inject 2.5 mLs (250 Units total) into the vein every 12 (twelve) hours., Disp: 300 mL, Rfl: 3   meclizine  (ANTIVERT ) 12.5 MG tablet, Take 1 tablet (12.5 mg total) by mouth 3 (three) times daily as needed for dizziness., Disp: 30 tablet, Rfl: 4   OLANZapine  (ZYPREXA ) 5 MG tablet, TAKE 1 TABLET(5 MG) BY MOUTH AT BEDTIME, Disp: 30 tablet, Rfl: 3   ondansetron  (ZOFRAN ) 8 MG tablet, TAKE 1 TABLET(8 MG) BY MOUTH EVERY 8 HOURS AS NEEDED FOR NAUSEA OR VOMITING, Disp: 30 tablet, Rfl: 2   potassium chloride  SA (KLOR-CON  M) 20 MEQ tablet, Take 1 tablet (20 mEq total) by mouth 2 (two) times daily. (Patient taking differently: Take 1 tablet (20 mEq total) by mouth 2 (two) times daily.), Disp: 180 tablet, Rfl: 1  ribociclib  succ (KISQALI  400MG  DAILY DOSE) 200 MG Therapy Pack, Take 2 tablets (400 mg total) by mouth daily. Take for 21 days on, 7 days off, repeat every 28 days., Disp: 63 tablet, Rfl: 6   sodium chloride  flush 0.9 % SOLN injection, Inject 10 mLs into the vein every 12 (twelve) hours., Disp: 600 mL, Rfl: 3   traMADol  (ULTRAM ) 50 MG tablet, Take 1 tablet (50 mg total) by mouth every 6 (six) hours as needed., Disp: 90 tablet, Rfl: 0   zoledronic  acid (ZOMETA ) 4 MG/5ML injection, Inject 4 mg into the vein every 3 (three) months., Disp: , Rfl:   Allergies: No Known Allergies  Past Medical History, Surgical history, Social history, and Family History were reviewed and updated.  Review of Systems: Review of Systems  Constitutional:  Positive for fatigue.  HENT:   Negative.    Eyes: Negative.   Respiratory: Negative.    Cardiovascular: Negative.   Gastrointestinal:  Positive for constipation and nausea.  Endocrine: Negative.   Genitourinary: Negative.    Musculoskeletal:  Positive for arthralgias and myalgias.  Skin: Negative.   Neurological: Negative.   Hematological: Negative.   Psychiatric/Behavioral: Negative.      Physical Exam: Vitals:   08/22/24 1236  BP: 116/60  Pulse: 64  Resp: 18  Temp: 98 F (36.7 C)  SpO2: 100%   Wt Readings from Last 3 Encounters:  08/22/24 157 lb 1.9 oz (71.3 kg)  07/25/24 156 lb 8 oz (71 kg)  06/20/24 154 lb 0.6 oz (69.9 kg)    Physical Exam Vitals reviewed.  HENT:     Head: Normocephalic and atraumatic.  Eyes:     Pupils: Pupils are equal, round, and reactive to light.  Cardiovascular:     Rate and Rhythm: Normal rate and regular rhythm.     Heart sounds: Normal heart sounds.  Pulmonary:     Effort: Pulmonary effort is normal.     Breath sounds: Normal breath sounds.  Abdominal:     General: Bowel sounds are normal.     Palpations: Abdomen is soft.  Musculoskeletal:        General: No tenderness or deformity. Normal range of motion.     Cervical back: Normal range of motion.  Lymphadenopathy:     Cervical: No cervical adenopathy.  Skin:    General: Skin is warm and dry.     Findings: No erythema or rash.  Neurological:     Mental Status: She is alert and oriented to person, place, and time.  Psychiatric:        Behavior: Behavior normal.        Thought Content: Thought content normal.        Judgment: Judgment normal.      Lab Results  Component Value Date   WBC 2.1 (L) 08/22/2024   HGB 10.3 (L) 08/22/2024   HCT 29.6 (L) 08/22/2024   MCV 108.0 (H) 08/22/2024   PLT 190 08/22/2024     Chemistry      Component Value Date/Time   NA 140 08/22/2024 1220   K 4.1 08/22/2024 1220   CL 103 08/22/2024 1220   CO2 26 08/22/2024 1220   BUN 16 08/22/2024 1220   CREATININE 1.48  (H) 08/22/2024 1220      Component Value Date/Time   CALCIUM  8.9 08/22/2024 1220   ALKPHOS 64 08/22/2024 1220   AST 33 08/22/2024 1220   ALT 17 08/22/2024 1220   BILITOT 0.4 08/22/2024 1220  Impression and Plan: Ms. Butterbaugh is a very nice 73 year old white female.  She has metastatic breast cancer.  We have her on antiestrogen therapy.  So far, I think she is done well with the antiestrogen therapy.   She also has B12 deficiency for which she takes B12 5,000 mcg once daily.   She will going get her Faslodex  today.  She will also get her Zometa  today.  Her quality of life is doing quite well.  I am very happy about this.  We will go ahead and plan to get her back in another month.   Maude JONELLE Crease, MD 8/27/20251:11 PM

## 2024-08-22 NOTE — Patient Instructions (Signed)
 Fulvestrant Injection What is this medication? FULVESTRANT (ful VES trant) treats breast cancer. It works by blocking the hormone estrogen in breast tissue, which prevents breast cancer cells from spreading or growing. This medicine may be used for other purposes; ask your health care provider or pharmacist if you have questions. COMMON BRAND NAME(S): FASLODEX What should I tell my care team before I take this medication? They need to know if you have any of these conditions: Bleeding disorder Liver disease Low blood cell levels (white cells, red cells, and platelets) An unusual or allergic reaction to fulvestrant, other medications, foods, dyes, or preservatives Pregnant or trying to get pregnant Breastfeeding How should I use this medication? This medication is injected into a muscle. It is given by your care team in a hospital or clinic setting. Talk to your care team about the use of this medication in children. Special care may be needed. Overdosage: If you think you have taken too much of this medicine contact a poison control center or emergency room at once. NOTE: This medicine is only for you. Do not share this medicine with others. What if I miss a dose? Keep appointments for follow-up doses. It is important not to miss your dose. Call your care team if you are unable to keep an appointment. What may interact with this medication? Fluoroestradiol F18 This list may not describe all possible interactions. Give your health care provider a list of all the medicines, herbs, non-prescription drugs, or dietary supplements you use. Also tell them if you smoke, drink alcohol, or use illegal drugs. Some items may interact with your medicine. What should I watch for while using this medication? Your condition will be monitored carefully while you are receiving this medication. You may need blood work done while you are taking this medication. This medication is injected into a muscle. Talk  to your care team if you also take medications that prevent or treat blood clots, such as warfarin. Blood thinners may increase the risk of bleeding or bruising in the muscle where this medication is injected. The benefits of this medication may outweigh the risks. Your care team can help you find the option that works for you. They can also help limit the risk of bleeding. Talk to your care team if you may be pregnant. Serious birth defects can occur if you take this medication during pregnancy and for 1 year after the last dose. You will need a negative pregnancy test before starting this medication. Contraception is recommended while taking this medication and for 1 year after the last dose. Your care team can help you find the option that works for you. Do not breastfeed while taking this medication and for 1 year after the last dose. This medication may cause infertility. Talk to your care team if you are concerned about your fertility. What side effects may I notice from receiving this medication? Side effects that you should report to your care team as soon as possible: Allergic reactions or angioedema--skin rash, itching or hives, swelling of the face, eyes, lips, tongue, arms, or legs, trouble swallowing or breathing Pain, tingling, or numbness in the hands or feet Side effects that usually do not require medical attention (report to your care team if they continue or are bothersome): Bone, joint, or muscle pain Constipation Headache Hot flashes Nausea Pain, redness, or irritation at injection site Unusual weakness or fatigue This list may not describe all possible side effects. Call your doctor for medical advice about side  effects. You may report side effects to FDA at 1-800-FDA-1088. Where should I keep my medication? This medication is given in a hospital or clinic. It will not be stored at home. NOTE: This sheet is a summary. It may not cover all possible information. If you have  questions about this medicine, talk to your doctor, pharmacist, or health care provider.  2024 Elsevier/Gold Standard (2023-08-19 00:00:00)

## 2024-08-23 ENCOUNTER — Ambulatory Visit: Payer: Self-pay | Admitting: Medical Oncology

## 2024-08-24 ENCOUNTER — Other Ambulatory Visit: Payer: Self-pay

## 2024-08-27 ENCOUNTER — Other Ambulatory Visit (HOSPITAL_BASED_OUTPATIENT_CLINIC_OR_DEPARTMENT_OTHER): Payer: Self-pay

## 2024-08-27 ENCOUNTER — Other Ambulatory Visit: Payer: Self-pay | Admitting: Hematology & Oncology

## 2024-08-28 ENCOUNTER — Other Ambulatory Visit (HOSPITAL_BASED_OUTPATIENT_CLINIC_OR_DEPARTMENT_OTHER): Payer: Self-pay

## 2024-08-28 MED ORDER — HEPARIN NA (PORK) LOCK FLSH PF 100 UNIT/ML IV SOLN
250.0000 [IU] | Freq: Two times a day (BID) | INTRAVENOUS | 3 refills | Status: DC
  Filled 2024-08-28: qty 300, 30d supply, fill #0
  Filled 2024-10-01: qty 300, 30d supply, fill #1
  Filled 2024-11-05: qty 300, 30d supply, fill #2
  Filled 2024-12-02: qty 300, 30d supply, fill #3

## 2024-08-29 ENCOUNTER — Inpatient Hospital Stay: Attending: Hematology & Oncology

## 2024-08-29 DIAGNOSIS — Z170 Estrogen receptor positive status [ER+]: Secondary | ICD-10-CM | POA: Insufficient documentation

## 2024-08-29 DIAGNOSIS — C7951 Secondary malignant neoplasm of bone: Secondary | ICD-10-CM | POA: Insufficient documentation

## 2024-08-29 DIAGNOSIS — Z5111 Encounter for antineoplastic chemotherapy: Secondary | ICD-10-CM | POA: Diagnosis not present

## 2024-08-29 DIAGNOSIS — C50911 Malignant neoplasm of unspecified site of right female breast: Secondary | ICD-10-CM | POA: Insufficient documentation

## 2024-08-29 DIAGNOSIS — G9332 Myalgic encephalomyelitis/chronic fatigue syndrome: Secondary | ICD-10-CM | POA: Insufficient documentation

## 2024-08-29 NOTE — Patient Instructions (Signed)

## 2024-08-30 ENCOUNTER — Other Ambulatory Visit: Payer: Self-pay

## 2024-08-30 NOTE — Progress Notes (Signed)
 Specialty Pharmacy Ongoing Clinical Assessment Note  Tracy Thompson is a 73 y.o. female who is being followed by the specialty pharmacy service for RxSp Oncology   Patient's specialty medication(s) reviewed today: Ribociclib  Succinate (KISQALI  400mg  Daily Dose)   Missed doses in the last 4 weeks: 0   Patient/Caregiver did not have any additional questions or concerns.   Therapeutic benefit summary: Patient is achieving benefit   Adverse events/side effects summary: Experienced adverse events/side effects (fatigue, hair thinning, dry skin - all tolerable at this time)   Patient's therapy is appropriate to: Continue    Goals Addressed             This Visit's Progress    Slow Disease Progression   On track    Patient is on track. Patient will maintain adherence. Per visit on 08/22/24, recent PET scan showed no active cancer and CA 27.29 is holding steady at 26.        Clinical Intervention Note  Clinical Intervention Notes: Patient recently started a B12 supplement, and asked about taking a biotin/collagen supplement for her hair thinning. No DDIs identified with Kisqali .   Clinical Intervention Outcomes: Prevention of an adverse drug event   Follow up: 6 months  [facility]

## 2024-09-05 ENCOUNTER — Inpatient Hospital Stay

## 2024-09-05 DIAGNOSIS — Z5111 Encounter for antineoplastic chemotherapy: Secondary | ICD-10-CM | POA: Diagnosis not present

## 2024-09-05 NOTE — Patient Instructions (Signed)

## 2024-09-12 ENCOUNTER — Inpatient Hospital Stay

## 2024-09-12 DIAGNOSIS — Z5111 Encounter for antineoplastic chemotherapy: Secondary | ICD-10-CM | POA: Diagnosis not present

## 2024-09-12 NOTE — Patient Instructions (Signed)

## 2024-09-13 DIAGNOSIS — H35041 Retinal micro-aneurysms, unspecified, right eye: Secondary | ICD-10-CM | POA: Diagnosis not present

## 2024-09-13 DIAGNOSIS — H2513 Age-related nuclear cataract, bilateral: Secondary | ICD-10-CM | POA: Diagnosis not present

## 2024-09-17 ENCOUNTER — Other Ambulatory Visit: Payer: Self-pay

## 2024-09-18 ENCOUNTER — Other Ambulatory Visit: Payer: Self-pay

## 2024-09-18 ENCOUNTER — Other Ambulatory Visit (HOSPITAL_COMMUNITY): Payer: Self-pay

## 2024-09-18 NOTE — Progress Notes (Signed)
 Specialty Pharmacy Refill Coordination Note  Spoke with Tracy Thompson is a 73 y.o. female contacted today regarding refills of specialty medication(s) Ribociclib  Succinate (KISQALI  400mg  Daily Dose)  Doses on hand: 5, next cycle 10/01/24  Patient requested: Delivery   Delivery date: 09/25/24   Verified address: [address] [place] [address]  Medication will be filled on 09/24/24.

## 2024-09-19 ENCOUNTER — Inpatient Hospital Stay

## 2024-09-19 ENCOUNTER — Inpatient Hospital Stay: Admitting: Hematology & Oncology

## 2024-09-19 ENCOUNTER — Encounter: Payer: Self-pay | Admitting: Hematology & Oncology

## 2024-09-19 VITALS — BP 108/48 | HR 71 | Temp 98.2°F | Resp 18 | Ht 66.0 in | Wt 159.0 lb

## 2024-09-19 DIAGNOSIS — C50919 Malignant neoplasm of unspecified site of unspecified female breast: Secondary | ICD-10-CM

## 2024-09-19 DIAGNOSIS — Z5111 Encounter for antineoplastic chemotherapy: Secondary | ICD-10-CM | POA: Diagnosis not present

## 2024-09-19 DIAGNOSIS — Z170 Estrogen receptor positive status [ER+]: Secondary | ICD-10-CM | POA: Diagnosis not present

## 2024-09-19 DIAGNOSIS — C7951 Secondary malignant neoplasm of bone: Secondary | ICD-10-CM | POA: Diagnosis not present

## 2024-09-19 DIAGNOSIS — C50911 Malignant neoplasm of unspecified site of right female breast: Secondary | ICD-10-CM | POA: Diagnosis not present

## 2024-09-19 DIAGNOSIS — G9332 Myalgic encephalomyelitis/chronic fatigue syndrome: Secondary | ICD-10-CM | POA: Diagnosis not present

## 2024-09-19 LAB — CBC WITH DIFFERENTIAL (CANCER CENTER ONLY)
Abs Immature Granulocytes: 0.02 K/uL (ref 0.00–0.07)
Basophils Absolute: 0 K/uL (ref 0.0–0.1)
Basophils Relative: 1 %
Eosinophils Absolute: 0 K/uL (ref 0.0–0.5)
Eosinophils Relative: 2 %
HCT: 29.2 % — ABNORMAL LOW (ref 36.0–46.0)
Hemoglobin: 10.3 g/dL — ABNORMAL LOW (ref 12.0–15.0)
Immature Granulocytes: 1 %
Lymphocytes Relative: 18 %
Lymphs Abs: 0.4 K/uL — ABNORMAL LOW (ref 0.7–4.0)
MCH: 38.6 pg — ABNORMAL HIGH (ref 26.0–34.0)
MCHC: 35.3 g/dL (ref 30.0–36.0)
MCV: 109.4 fL — ABNORMAL HIGH (ref 80.0–100.0)
Monocytes Absolute: 0.3 K/uL (ref 0.1–1.0)
Monocytes Relative: 12 %
Neutro Abs: 1.4 K/uL — ABNORMAL LOW (ref 1.7–7.7)
Neutrophils Relative %: 66 %
Platelet Count: 162 K/uL (ref 150–400)
RBC: 2.67 MIL/uL — ABNORMAL LOW (ref 3.87–5.11)
RDW: 13.2 % (ref 11.5–15.5)
WBC Count: 2 K/uL — ABNORMAL LOW (ref 4.0–10.5)
nRBC: 0 % (ref 0.0–0.2)

## 2024-09-19 LAB — CMP (CANCER CENTER ONLY)
ALT: 15 U/L (ref 0–44)
AST: 26 U/L (ref 15–41)
Albumin: 3.6 g/dL (ref 3.5–5.0)
Alkaline Phosphatase: 65 U/L (ref 38–126)
Anion gap: 10 (ref 5–15)
BUN: 16 mg/dL (ref 8–23)
CO2: 26 mmol/L (ref 22–32)
Calcium: 8.6 mg/dL — ABNORMAL LOW (ref 8.9–10.3)
Chloride: 104 mmol/L (ref 98–111)
Creatinine: 1.44 mg/dL — ABNORMAL HIGH (ref 0.44–1.00)
GFR, Estimated: 38 mL/min — ABNORMAL LOW (ref >60)
Glucose, Bld: 112 mg/dL — ABNORMAL HIGH (ref 70–99)
Potassium: 3.8 mmol/L (ref 3.5–5.1)
Sodium: 139 mmol/L (ref 135–145)
Total Bilirubin: 0.4 mg/dL (ref 0.0–1.2)
Total Protein: 6.2 g/dL — ABNORMAL LOW (ref 6.5–8.1)

## 2024-09-19 LAB — VITAMIN B12: Vitamin B-12: 1824 pg/mL — ABNORMAL HIGH (ref 180–914)

## 2024-09-19 MED ORDER — FULVESTRANT 250 MG/5ML IM SOSY
500.0000 mg | PREFILLED_SYRINGE | Freq: Once | INTRAMUSCULAR | Status: AC
  Administered 2024-09-19: 500 mg via INTRAMUSCULAR
  Filled 2024-09-19: qty 10

## 2024-09-19 NOTE — Progress Notes (Signed)
 Hematology and Oncology Follow Up Visit  Tracy Thompson [id number] [birth year] 73 y.o. 09/19/2024   Principle Diagnosis:  Metastatic breast cancer-bone metastasis/hilar lymph node metastasis- ER+/PR+/HER-2 (2+) -- PIK3CA (+)  Current Therapy:   Faslodex  500 mg IM monthly -start on 11/09/2022 Ribociclib  600 mg p.o. daily (21/7) --start on 11/09/2022 --changed to 400 mg p.o. daily started on 09/05/2023 Zometa  4 mg IV every 3 months - next dose 10/2024 Radiation therapy to left hip -completed in 11/23/2022     Interim History:  [name] is back for follow-up.  Overall, she is doing quite well.  We last saw her a month ago.  She is having no problems with the ribociclib .  She has had no nausea or vomiting.  She has had no bleeding.  She has had no fever.  She has had no change in bowel or bladder habits.  She and her husband have been staying around locally.  I know he has chronic fatigue syndrome.  It makes it hard for him to get out for a long period of time.  Her last CA 27.29 was 26.  She has had no problems with bowels or bladder.  She has had no cough.  She has had no headache.  However appetite has been quite good.  Her last vitamin B12 level was 2068 pg/dL.  She is on oral vitamin B-12.  Currently, I would say that her performance status is ECOG 1.    Wt Readings from Last 3 Encounters:  09/19/24 159 lb (72.1 kg)  08/22/24 157 lb 1.9 oz (71.3 kg)  07/25/24 156 lb 8 oz (71 kg)    Medications:  Current Outpatient Medications:    aspirin 81 MG tablet, Take 81 mg by mouth daily., Disp: , Rfl:    bisacodyl (DULCOLAX) 5 MG EC tablet, Take 5 mg by mouth daily as needed for moderate constipation., Disp: , Rfl:    bisoprolol -hydrochlorothiazide  (ZIAC ) 10-6.25 MG tablet, Take 1 tablet by mouth daily., Disp: 90 tablet, Rfl: 0   carboxymethylcellulose (REFRESH PLUS) 0.5 % SOLN, Place 1 drop into both eyes 3 (three) times daily as needed., Disp: , Rfl:    Cholecalciferol (VITAMIN D )  50 MCG (2000 UT) CAPS, Take 2,000 Units by mouth daily., Disp: , Rfl:    Cyanocobalamin  (VITAMIN B 12) 500 MCG TABS, Take 1,000 mcg by mouth daily., Disp: , Rfl:    fulvestrant  (FASLODEX ) 250 MG/5ML injection, Inject 500 mg into the muscle every 30 (thirty) days. One injection each buttock over 1-2 minutes. Warm prior to use., Disp: , Rfl:    Heparin  Na, Pork, Lock Flsh PF 100 UNIT/ML SOLN, Inject 2.5 mLs (250 Units total) into the vein every 12 (twelve) hours., Disp: 300 mL, Rfl: 3   meclizine  (ANTIVERT ) 12.5 MG tablet, Take 1 tablet (12.5 mg total) by mouth 3 (three) times daily as needed for dizziness., Disp: 30 tablet, Rfl: 4   OLANZapine  (ZYPREXA ) 5 MG tablet, TAKE 1 TABLET(5 MG) BY MOUTH AT BEDTIME, Disp: 30 tablet, Rfl: 3   ondansetron  (ZOFRAN ) 8 MG tablet, TAKE 1 TABLET(8 MG) BY MOUTH EVERY 8 HOURS AS NEEDED FOR NAUSEA OR VOMITING, Disp: 30 tablet, Rfl: 2   potassium chloride  SA (KLOR-CON  M) 20 MEQ tablet, Take 1 tablet (20 mEq total) by mouth 2 (two) times daily., Disp: 180 tablet, Rfl: 1   ribociclib  succ (KISQALI  400MG  DAILY DOSE) 200 MG Therapy Pack, Take 2 tablets (400 mg total) by mouth daily. Take for 21 days on, 7 days  off, repeat every 28 days., Disp: 63 tablet, Rfl: 6   sodium chloride  flush 0.9 % SOLN injection, Inject 10 mLs into the vein every 12 (twelve) hours., Disp: 600 mL, Rfl: 3   traMADol  (ULTRAM ) 50 MG tablet, Take 1 tablet (50 mg total) by mouth every 6 (six) hours as needed., Disp: 90 tablet, Rfl: 0   zoledronic  acid (ZOMETA ) 4 MG/5ML injection, Inject 4 mg into the vein every 3 (three) months., Disp: , Rfl:   Allergies: No Known Allergies  Past Medical History, Surgical history, Social history, and Family History were reviewed and updated.  Review of Systems: Review of Systems  Constitutional:  Positive for fatigue.  HENT:  Negative.    Eyes: Negative.   Respiratory: Negative.    Cardiovascular: Negative.   Gastrointestinal:  Positive for constipation and  nausea.  Endocrine: Negative.   Genitourinary: Negative.    Musculoskeletal:  Positive for arthralgias and myalgias.  Skin: Negative.   Neurological: Negative.   Hematological: Negative.   Psychiatric/Behavioral: Negative.      Physical Exam: Vitals:   09/19/24 1259  BP: (!) 108/48  Pulse: 71  Resp: 18  Temp: 98.2 F (36.8 C)  SpO2: 99%   Wt Readings from Last 3 Encounters:  09/19/24 159 lb (72.1 kg)  08/22/24 157 lb 1.9 oz (71.3 kg)  07/25/24 156 lb 8 oz (71 kg)    Physical Exam Vitals reviewed.  HENT:     Head: Normocephalic and atraumatic.  Eyes:     Pupils: Pupils are equal, round, and reactive to light.  Cardiovascular:     Rate and Rhythm: Normal rate and regular rhythm.     Heart sounds: Normal heart sounds.  Pulmonary:     Effort: Pulmonary effort is normal.     Breath sounds: Normal breath sounds.  Abdominal:     General: Bowel sounds are normal.     Palpations: Abdomen is soft.  Musculoskeletal:        General: No tenderness or deformity. Normal range of motion.     Cervical back: Normal range of motion.  Lymphadenopathy:     Cervical: No cervical adenopathy.  Skin:    General: Skin is warm and dry.     Findings: No erythema or rash.  Neurological:     Mental Status: She is alert and oriented to person, place, and time.  Psychiatric:        Behavior: Behavior normal.        Thought Content: Thought content normal.        Judgment: Judgment normal.      Lab Results  Component Value Date   WBC 2.0 (L) 09/19/2024   HGB 10.3 (L) 09/19/2024   HCT 29.2 (L) 09/19/2024   MCV 109.4 (H) 09/19/2024   PLT 162 09/19/2024     Chemistry      Component Value Date/Time   NA 139 09/19/2024 1234   K 3.8 09/19/2024 1234   CL 104 09/19/2024 1234   CO2 26 09/19/2024 1234   BUN 16 09/19/2024 1234   CREATININE 1.44 (H) 09/19/2024 1234      Component Value Date/Time   CALCIUM  8.6 (L) 09/19/2024 1234   ALKPHOS 65 09/19/2024 1234   AST 26 09/19/2024 1234    ALT 15 09/19/2024 1234   BILITOT 0.4 09/19/2024 1234      Impression and Plan: [name] is a very nice 73 year old white female.  She has metastatic breast cancer.  We have her on antiestrogen therapy.  So far, I think she is done well with the antiestrogen therapy.   Overall, I really think things are going quite well for her.  We do not need a PET scan probably until November.  She is doing nicely on the ribociclib .  Her blood counts are little bit low which I expect.  For right now, we will plan to get her back in 1 month.  She will have her Faslodex .   Maude JONELLE Crease, MD 9/24/20251:23 PM

## 2024-09-19 NOTE — Patient Instructions (Signed)
 Fulvestrant Injection What is this medication? FULVESTRANT (ful VES trant) treats breast cancer. It works by blocking the hormone estrogen in breast tissue, which prevents breast cancer cells from spreading or growing. This medicine may be used for other purposes; ask your health care provider or pharmacist if you have questions. COMMON BRAND NAME(S): FASLODEX What should I tell my care team before I take this medication? They need to know if you have any of these conditions: Bleeding disorder Liver disease Low blood cell levels (white cells, red cells, and platelets) An unusual or allergic reaction to fulvestrant, other medications, foods, dyes, or preservatives Pregnant or trying to get pregnant Breastfeeding How should I use this medication? This medication is injected into a muscle. It is given by your care team in a hospital or clinic setting. Talk to your care team about the use of this medication in children. Special care may be needed. Overdosage: If you think you have taken too much of this medicine contact a poison control center or emergency room at once. NOTE: This medicine is only for you. Do not share this medicine with others. What if I miss a dose? Keep appointments for follow-up doses. It is important not to miss your dose. Call your care team if you are unable to keep an appointment. What may interact with this medication? Fluoroestradiol F18 This list may not describe all possible interactions. Give your health care provider a list of all the medicines, herbs, non-prescription drugs, or dietary supplements you use. Also tell them if you smoke, drink alcohol, or use illegal drugs. Some items may interact with your medicine. What should I watch for while using this medication? Your condition will be monitored carefully while you are receiving this medication. You may need blood work done while you are taking this medication. This medication is injected into a muscle. Talk  to your care team if you also take medications that prevent or treat blood clots, such as warfarin. Blood thinners may increase the risk of bleeding or bruising in the muscle where this medication is injected. The benefits of this medication may outweigh the risks. Your care team can help you find the option that works for you. They can also help limit the risk of bleeding. Talk to your care team if you may be pregnant. Serious birth defects can occur if you take this medication during pregnancy and for 1 year after the last dose. You will need a negative pregnancy test before starting this medication. Contraception is recommended while taking this medication and for 1 year after the last dose. Your care team can help you find the option that works for you. Do not breastfeed while taking this medication and for 1 year after the last dose. This medication may cause infertility. Talk to your care team if you are concerned about your fertility. What side effects may I notice from receiving this medication? Side effects that you should report to your care team as soon as possible: Allergic reactions or angioedema--skin rash, itching or hives, swelling of the face, eyes, lips, tongue, arms, or legs, trouble swallowing or breathing Pain, tingling, or numbness in the hands or feet Side effects that usually do not require medical attention (report to your care team if they continue or are bothersome): Bone, joint, or muscle pain Constipation Headache Hot flashes Nausea Pain, redness, or irritation at injection site Unusual weakness or fatigue This list may not describe all possible side effects. Call your doctor for medical advice about side  effects. You may report side effects to FDA at 1-800-FDA-1088. Where should I keep my medication? This medication is given in a hospital or clinic. It will not be stored at home. NOTE: This sheet is a summary. It may not cover all possible information. If you have  questions about this medicine, talk to your doctor, pharmacist, or health care provider.  2024 Elsevier/Gold Standard (2023-08-19 00:00:00)

## 2024-09-19 NOTE — Patient Instructions (Signed)

## 2024-09-20 LAB — CANCER ANTIGEN 27.29: CA 27.29: 52.7 U/mL — ABNORMAL HIGH (ref 0.0–38.6)

## 2024-09-24 ENCOUNTER — Other Ambulatory Visit: Payer: Self-pay

## 2024-09-26 ENCOUNTER — Inpatient Hospital Stay: Attending: Hematology & Oncology

## 2024-09-26 DIAGNOSIS — C50911 Malignant neoplasm of unspecified site of right female breast: Secondary | ICD-10-CM | POA: Diagnosis not present

## 2024-09-26 DIAGNOSIS — C7951 Secondary malignant neoplasm of bone: Secondary | ICD-10-CM | POA: Insufficient documentation

## 2024-09-26 DIAGNOSIS — C771 Secondary and unspecified malignant neoplasm of intrathoracic lymph nodes: Secondary | ICD-10-CM | POA: Insufficient documentation

## 2024-09-26 DIAGNOSIS — Z170 Estrogen receptor positive status [ER+]: Secondary | ICD-10-CM | POA: Diagnosis not present

## 2024-09-26 DIAGNOSIS — Z79899 Other long term (current) drug therapy: Secondary | ICD-10-CM | POA: Insufficient documentation

## 2024-09-26 NOTE — Patient Instructions (Signed)

## 2024-10-01 ENCOUNTER — Other Ambulatory Visit (HOSPITAL_BASED_OUTPATIENT_CLINIC_OR_DEPARTMENT_OTHER): Payer: Self-pay

## 2024-10-03 ENCOUNTER — Other Ambulatory Visit: Payer: Self-pay

## 2024-10-03 ENCOUNTER — Inpatient Hospital Stay

## 2024-10-03 DIAGNOSIS — C50911 Malignant neoplasm of unspecified site of right female breast: Secondary | ICD-10-CM | POA: Diagnosis not present

## 2024-10-03 NOTE — Patient Instructions (Signed)
 PICC Home Care Guide A peripherally inserted central catheter (PICC) is a form of IV access that allows medicines and IV fluids to be quickly put into the blood and spread throughout the body. The PICC is a long, thin, flexible tube (catheter) that is put into a vein in a person's arm or leg. The catheter ends in a large vein just outside the heart called the superior vena cava (SVC). After the PICC is put in, a chest X-ray may be done to make sure that it is in the right place. A PICC may be placed for different reasons, such as: To give medicines and liquid nutrition. To give IV fluids and blood products. To take blood samples often. If there is trouble placing a peripheral intravenous (PIV) catheter. If cared for properly, a PICC can remain in place for many months. Having a PICC can allow you to go home from the hospital sooner and continue treatment at home. Medicines and PICC care can be managed at home by a family member, caregiver, or home health care team. What are the risks? Generally, having a PICC is safe. However, problems may occur, including: A blood clot (thrombus) forming in or at the end of the PICC. A blood clot forming in a vein (deep vein thrombosis) or traveling to the lung (pulmonary embolism). Inflammation of the vein (phlebitis) in which the PICC is placed. Infection at the insertion site or in the blood. Blood infections from central lines, like PICCs, can be serious and often require a hospital stay. PICC malposition, or PICC movement or poor placement. A break or cut in the PICC. Do not use scissors near the PICC. Nerve or tendon irritation or injury during PICC insertion. How to care for your PICC Please follow the specific guidelines provided by your health care provider. Preventing infection You and any caregivers should wash your hands often with soap and water for at least 20 seconds. Wash hands: Before touching the PICC or the infusion device. Before changing a  bandage (dressing). Do not change the dressing unless you have been taught to do so and have shown you are able to change it safely. Flush the PICC as told. Tell your health care provider right away if the PICC is hard to flush or does not flush. Do not use force to flush the PICC. Use clean and germ-free (sterile) supplies only. Keep the supplies in a dry place. Do not reuse needles, syringes, or any other supplies. Reusing supplies can lead to infection. Keep the PICC dressing dry and secure it with tape if the edges stop sticking to your skin. Check your PICC insertion site every day for signs of infection. Check for: Redness, swelling, or pain. Fluid or blood. Warmth. Pus or a bad smell. Preventing other problems Do not use a syringe that is less than 10 mL to flush the PICC. Do not have your blood pressure checked on the arm in which the PICC is placed. Do not ever pull or tug on the PICC. Keep it secured to your arm with tape or a stretch wrap when not in use. Do not take the PICC out yourself. Only a trained health care provider should remove the PICC. Keep pets and children away from your PICC. How to care for your PICC dressing Keep your PICC dressing clean and dry to prevent infection. Do not take baths, swim, or use a hot tub until your health care provider approves. Ask your health care provider if you can take  showers. You may only be allowed to take sponge baths. When you are allowed to shower: Ask your health care provider to teach you how to wrap the PICC. Cover the PICC with clear plastic wrap and tape to keep it dry while showering. Follow instructions from your health care provider about how to take care of your insertion site and dressing. Make sure you: Wash your hands with soap and water for at least 20 seconds before and after you change your dressing. If soap and water are not available, use hand sanitizer. Change your dressing only if taught to do so by your health care  provider. Your PICC dressing needs to be changed if it becomes loose or wet. Leave stitches (sutures), skin glue, or adhesive strips in place. These skin closures may need to stay in place for 2 weeks or longer. If adhesive strip edges start to loosen and curl up, you may trim the loose edges. Do not remove adhesive strips completely unless your health care provider tells you to do that. Follow these instructions at home: Disposal of supplies Throw away any syringes in a disposal container that is meant for sharp items (sharps container). You can buy a sharps container from a pharmacy, or you can make one by using an empty, hard plastic bottle with a lid. Place any used dressings or infusion bags into a plastic bag. Throw that bag in the trash. General instructions  Always carry your PICC identification card or wear a medical alert bracelet. Keep the tube clamped at all times, unless it is being used. Always carry a smooth-edge clamp with you to clamp the PICC if it breaks. Do not use scissors or sharp objects near the tube. You may bend your arm and move it freely. If your PICC is near or at the bend of your elbow, avoid activity with repeated motion at the elbow. Avoid lifting heavy objects as told by your health care provider. Keep all follow-up visits. This is important. You will need to have your PICC dressing changed at least once a week. Contact a health care provider if: You have pain in your arm, ear, face, or teeth. You have a fever or chills. You have redness, swelling, or pain around the insertion site. You have fluid or blood coming from the insertion site. Your insertion site feels warm to the touch. You have pus or a bad smell coming from the insertion site. Your skin feels hard and raised around the insertion site. Your PICC dressing has gotten wet or is coming off and you have not been taught how to change it. Get help right away if: You have problems with your PICC, such as  your PICC: Was tugged or pulled and has partially come out. Do not  push the PICC back in. Cannot be flushed, is hard to flush, or leaks around the insertion site when it is flushed. Makes a flushing sound when it is flushed. Appears to have a hole or tear. Is accidentally pulled all the way out. If this happens, cover the insertion site with a gauze dressing. Do not throw the PICC away. Your health care provider will need to check it to be sure the entire catheter came out. You feel your heart racing or skipping beats, or you have chest pain. You have shortness of breath or trouble breathing. You have swelling, redness, warmth, or pain in the arm in which the PICC is placed. You have a red streak going up your arm that  starts under the PICC dressing. These symptoms may be an emergency. Get help right away. Call 911. Do not wait to see if the symptoms will go away. Do not drive yourself to the hospital. Summary A peripherally inserted central catheter (PICC) is a long, thin, flexible tube (catheter) that is put into a vein in the arm or leg. If cared for properly, a PICC can remain in place for many months. Having a PICC can allow you to go home from the hospital sooner and continue treatment at home. The PICC is inserted using a germ-free (sterile) technique by a specially trained health care provider. Only a trained health care provider should remove it. Do not have your blood pressure checked on the arm in which your PICC is placed. Always keep your PICC identification card with you. This information is not intended to replace advice given to you by your health care provider. Make sure you discuss any questions you have with your health care provider. Document Revised: 07/01/2021 Document Reviewed: 07/01/2021 Elsevier Patient Education  2024 [employer].

## 2024-10-08 ENCOUNTER — Encounter: Payer: Self-pay | Admitting: Family Medicine

## 2024-10-08 ENCOUNTER — Ambulatory Visit: Admitting: Family Medicine

## 2024-10-08 VITALS — BP 122/76 | HR 73 | Temp 98.2°F | Resp 18 | Ht 66.0 in | Wt 159.6 lb

## 2024-10-08 DIAGNOSIS — H8110 Benign paroxysmal vertigo, unspecified ear: Secondary | ICD-10-CM | POA: Diagnosis not present

## 2024-10-08 DIAGNOSIS — R600 Localized edema: Secondary | ICD-10-CM

## 2024-10-08 DIAGNOSIS — Z0000 Encounter for general adult medical examination without abnormal findings: Secondary | ICD-10-CM

## 2024-10-08 DIAGNOSIS — I10 Essential (primary) hypertension: Secondary | ICD-10-CM | POA: Diagnosis not present

## 2024-10-08 DIAGNOSIS — Z23 Encounter for immunization: Secondary | ICD-10-CM | POA: Diagnosis not present

## 2024-10-08 DIAGNOSIS — C7951 Secondary malignant neoplasm of bone: Secondary | ICD-10-CM

## 2024-10-08 DIAGNOSIS — C50919 Malignant neoplasm of unspecified site of unspecified female breast: Secondary | ICD-10-CM | POA: Diagnosis not present

## 2024-10-08 MED ORDER — HYDROCHLOROTHIAZIDE 25 MG PO TABS: 25.0000 mg | ORAL_TABLET | Freq: Every day | ORAL | 3 refills | Status: AC

## 2024-10-08 MED ORDER — MECLIZINE HCL 12.5 MG PO TABS: 12.5000 mg | ORAL_TABLET | Freq: Three times a day (TID) | ORAL | 4 refills | Status: AC | PRN

## 2024-10-08 MED ORDER — NONFORMULARY OR COMPOUNDED ITEM: 0 refills | Status: DC

## 2024-10-08 NOTE — Assessment & Plan Note (Signed)
 Per oncology

## 2024-10-08 NOTE — Assessment & Plan Note (Signed)
 Ghm utd Check labs  See AVS  Health Maintenance  Topic Date Due   DTaP/Tdap/Td (3 - Td or Tdap) 04/17/2023   Medicare Annual Wellness (AWV)  07/29/2023   Influenza Vaccine  07/27/2024   COVID-19 Vaccine (6 - 2025-26 season) 10/24/2024 (Originally 08/27/2024)   Mammogram  10/08/2025 (Originally 09/02/2023)   DEXA SCAN  10/08/2025 (Originally 09/01/2024)   Colonoscopy  10/08/2025 (Originally 07/25/2022)   Pneumococcal Vaccine: 50+ Years  Completed   Hepatitis C Screening  Completed   Zoster Vaccines- Shingrix   Completed   Meningococcal B Vaccine  Aged Out   Hepatitis B Vaccines 19-59 Average Risk  Discontinued

## 2024-10-08 NOTE — Progress Notes (Signed)
 Subjective:    Patient ID: Tracy Thompson, female    DOB: 03-12-1951, 73 y.o.   MRN: [id number]  Chief Complaint  Patient presents with   Annual Exam    Pt states fasting     HPI Patient is in today for cpe.  Discussed the use of AI scribe software for clinical note transcription with the patient, who gave verbal consent to proceed.  History of Present Illness Tracy Thompson is a 73 year old female with hypertension and edema who presents for routine follow-up and management.  She attends weekly appointments at the cancer center for blood pressure monitoring and monthly lab work. She tracks her blood pressure readings and is currently on Ziac , which contains a diuretic. She prefers to continue this medication due to its diuretic effect, which she finds necessary for managing her symptoms.  She experiences swelling in her ankles, with the left leg being slightly larger than the right. She notes frequent urination as a side effect of the diuretic use.  Her past medical history includes undergoing radiation therapy two years ago following her father's passing. She was significantly debilitated during that time, requiring assistance from her sister-in-law. She feels better now but acknowledges that both she and her husband, who has chronic fatigue syndrome and back issues, are not at full capacity.  She discusses her family history, noting that no other family members had breast cancer. She mentions having a genetic mutation identified during a genetic screening but did not pursue further testing as she has no children and her family is deceased.  Regarding her social history, she and her husband manage daily activities together despite their health challenges. They occasionally rely on takeout meals due to physical limitations.  In terms of her current medications, she is on Ziac  for blood pressure management and takes aspirin. She has not had knee surgery but is undergoing physical  therapy for a torn ACL, which has been effective in managing her symptoms without the need for surgery.  No changes in family history. Regular visits to the eye doctor and dentist. Not recently vaccinated for COVID due to adverse effects but up to date with other vaccinations, including the flu shot.    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>C (p.[id number])     Past Surgical History:  Procedure Laterality Date   BREAST LUMPECTOMY Right 1994   w/ radiation and chemotherapy following lumpectomy   BRONCHIAL BIOPSY  11/10/2022   Procedure: BRONCHIAL BIOPSIES;  Surgeon: Alaine Vicenta NOVAK, MD;  Location: [facility] ENDOSCOPY;  Service: Cardiopulmonary;;   BRONCHIAL BRUSHINGS  11/10/2022   Procedure: BRONCHIAL BRUSHINGS;  Surgeon: Alaine Vicenta NOVAK, MD;  Location: [facility] ENDOSCOPY;  Service: Cardiopulmonary;;   BRONCHIAL WASHINGS  11/10/2022   Procedure: BRONCHIAL WASHINGS;  Surgeon: Alaine Vicenta NOVAK, MD;  Location: [facility] ENDOSCOPY;  Service: Cardiopulmonary;;   CHOLECYSTECTOMY  06/20/2002  Laparoscopic cholestectomy   IR FLUORO GUIDE CV LINE LEFT  07/08/2023   IR IMAGING GUIDED PORT INSERTION  07/08/2023   IR US  GUIDE VASC ACCESS LEFT  07/08/2023   IR VENO/EXT/UNI LEFT  07/08/2023   IR VENO/JUGULAR LEFT  07/08/2023   TOTAL MASTECTOMY Right 04/12/2023   Procedure: RIGHT TOTAL MASTECTOMY;  Surgeon: Ebbie Cough, MD;  Location: [facility] OR;  Service: General;  Laterality: Right;   varicose  veins Bilateral 2013   laser surgery   Dr. Audrey   VIDEO BRONCHOSCOPY WITH ENDOBRONCHIAL ULTRASOUND N/A 11/10/2022   Procedure: VIDEO BRONCHOSCOPY WITH ENDOBRONCHIAL ULTRASOUND;  Surgeon: Alaine Vicenta NOVAK, MD;  Location: [facility] ENDOSCOPY;  Service: Cardiopulmonary;  Laterality: N/A;    Family History  Problem Relation Age of Onset   Colon cancer Mother 72 - 39   Stroke Mother    Hypertension Mother    Hyperlipidemia Mother    Hepatitis Mother        hep A   COPD Mother    Dementia Mother    Bladder Cancer Mother 35   Hypertension Father    Prostate cancer Father    Diabetes Father    Stroke Father    Pulmonary embolism Father    Deep vein thrombosis Father        PE   Schizophrenia Brother    Mental illness Brother    COPD Brother    Hepatitis C Brother    Liver disease Other     Social History   Socioeconomic History   Marital status: Married    Spouse name: ANGELYNA HENDERSON   Number of children: Not on file   Years of education: Not on file   Highest education level: Not on file  Occupational History   Occupation: retired Magazine features editor: RETIRED  Tobacco Use   Smoking status: Never   Smokeless tobacco: Never  Vaping Use   Vaping status: Never Used  Substance and Sexual Activity   Alcohol use: No   Drug use: No   Sexual activity: Not Currently    Partners: Male  Other Topics Concern   Not on file  Social History Narrative   Exercise---  no ,    Social Drivers of Health   Financial Resource Strain: Low Risk  (07/20/2021)   Overall Financial Resource Strain (CARDIA)    Difficulty of Paying Living Expenses: Not hard at all  Food Insecurity: No Food Insecurity (04/12/2023)   Hunger Vital Sign    Worried About Running Out of Food in the Last Year: Never true    Ran Out of Food in the Last Year: Never true  Transportation Needs: No Transportation Needs (04/12/2023)   PRAPARE - Administrator, Civil Service (Medical): No    Lack of  Transportation (Non-Medical): No  Physical Activity: Sufficiently Active (07/20/2021)   Exercise Vital Sign    Days of Exercise per Week: 7 days    Minutes of Exercise per Session: 30 min  Stress: No Stress Concern Present (07/20/2021)   [employer] of Occupational Health - Occupational Stress Questionnaire    Feeling of Stress : Not at all  Social Connections: Moderately Isolated (07/20/2021)   Social Connection and Isolation Panel    Frequency of Communication with Friends and Family: More than three times a week    Frequency of Social Gatherings with Friends and Family: More than three times a week    Attends Religious Services: Never    Active Member of  Clubs or Organizations: No    Attends Banker Meetings: Never    Marital Status: Married  Catering manager Violence: Not At Risk (07/20/2021)   Humiliation, Afraid, Rape, and Kick questionnaire    Fear of Current or Ex-Partner: No    Emotionally Abused: No    Physically Abused: No    Sexually Abused: No    Outpatient Medications Prior to Visit  Medication Sig Dispense Refill   aspirin 81 MG tablet Take 81 mg by mouth daily.     bisacodyl (DULCOLAX) 5 MG EC tablet Take 5 mg by mouth daily as needed for moderate constipation.     bisoprolol -hydrochlorothiazide  (ZIAC ) 10-6.25 MG tablet Take 1 tablet by mouth daily. 90 tablet 0   carboxymethylcellulose (REFRESH PLUS) 0.5 % SOLN Place 1 drop into both eyes 3 (three) times daily as needed.     Cholecalciferol (VITAMIN D ) 50 MCG (2000 UT) CAPS Take 2,000 Units by mouth daily.     Cyanocobalamin  (VITAMIN B 12) 500 MCG TABS Take 1,000 mcg by mouth daily.     fulvestrant  (FASLODEX ) 250 MG/5ML injection Inject 500 mg into the muscle every 30 (thirty) days. One injection each buttock over 1-2 minutes. Warm prior to use.     Heparin  Na, Pork, Lock Flsh PF 100 UNIT/ML SOLN Inject 2.5 mLs (250 Units total) into the vein every 12 (twelve) hours. 300 mL 3   OLANZapine  (ZYPREXA ) 5  MG tablet TAKE 1 TABLET(5 MG) BY MOUTH AT BEDTIME 30 tablet 3   ondansetron  (ZOFRAN ) 8 MG tablet TAKE 1 TABLET(8 MG) BY MOUTH EVERY 8 HOURS AS NEEDED FOR NAUSEA OR VOMITING 30 tablet 2   potassium chloride  SA (KLOR-CON  M) 20 MEQ tablet Take 1 tablet (20 mEq total) by mouth 2 (two) times daily. 180 tablet 1   ribociclib  succ (KISQALI  400MG  DAILY DOSE) 200 MG Therapy Pack Take 2 tablets (400 mg total) by mouth daily. Take for 21 days on, 7 days off, repeat every 28 days. 63 tablet 6   sodium chloride  flush 0.9 % SOLN injection Inject 10 mLs into the vein every 12 (twelve) hours. 600 mL 3   traMADol  (ULTRAM ) 50 MG tablet Take 1 tablet (50 mg total) by mouth every 6 (six) hours as needed. 90 tablet 0   zoledronic  acid (ZOMETA ) 4 MG/5ML injection Inject 4 mg into the vein every 3 (three) months.     meclizine  (ANTIVERT ) 12.5 MG tablet Take 1 tablet (12.5 mg total) by mouth 3 (three) times daily as needed for dizziness. 30 tablet 4   No facility-administered medications prior to visit.    No Known Allergies  Review of Systems  Constitutional:  Negative for fever and malaise/fatigue.  HENT:  Negative for congestion.   Eyes:  Negative for blurred vision.  Respiratory:  Negative for cough and shortness of breath.   Cardiovascular:  Negative for chest pain, palpitations and leg swelling.  Gastrointestinal:  Negative for vomiting.  Musculoskeletal:  Negative for back pain.  Skin:  Negative for rash.  Neurological:  Negative for loss of consciousness and headaches.       Objective:    Physical Exam Vitals and nursing note reviewed.  Constitutional:      General: She is not in acute distress.    Appearance: Normal appearance. She is well-developed.  HENT:     Head: Normocephalic and atraumatic.     Right Ear: Tympanic membrane, ear canal and external ear normal. There is no impacted cerumen.  Left Ear: Tympanic membrane, ear canal and external ear normal. There is no impacted cerumen.      Nose: Nose normal.     Mouth/Throat:     Mouth: Mucous membranes are moist.     Pharynx: Oropharynx is clear. No oropharyngeal exudate or posterior oropharyngeal erythema.  Eyes:     General: No scleral icterus.       Right eye: No discharge.        Left eye: No discharge.     Conjunctiva/sclera: Conjunctivae normal.     Pupils: Pupils are equal, round, and reactive to light.  Neck:     Thyroid : No thyromegaly or thyroid  tenderness.     Vascular: No JVD.  Cardiovascular:     Rate and Rhythm: Normal rate and regular rhythm.     Heart sounds: Normal heart sounds. No murmur heard. Pulmonary:     Effort: Pulmonary effort is normal. No respiratory distress.     Breath sounds: Normal breath sounds.  Abdominal:     General: Bowel sounds are normal. There is no distension.     Palpations: Abdomen is soft. There is no mass.     Tenderness: There is no abdominal tenderness. There is no guarding or rebound.  Musculoskeletal:        General: Normal range of motion.     Cervical back: Normal range of motion and neck supple.     Right lower leg: No edema.     Left lower leg: No edema.  Lymphadenopathy:     Cervical: No cervical adenopathy.  Skin:    General: Skin is warm and dry.     Findings: No erythema or rash.  Neurological:     Mental Status: She is alert and oriented to person, place, and time.     Cranial Nerves: No cranial nerve deficit.     Deep Tendon Reflexes: Reflexes are normal and symmetric.  Psychiatric:        Mood and Affect: Mood normal.        Behavior: Behavior normal.        Thought Content: Thought content normal.        Judgment: Judgment normal.     BP 122/76 (BP Location: Left Arm, Patient Position: Sitting, Cuff Size: Normal)   Pulse 73   Temp 98.2 F (36.8 C) (Oral)   Resp 18   Ht 5' 6 (1.676 m)   Wt 159 lb 9.6 oz (72.4 kg)   SpO2 97%   BMI 25.76 kg/m  Wt Readings from Last 3 Encounters:  10/08/24 159 lb 9.6 oz (72.4 kg)  09/19/24 159 lb (72.1  kg)  08/22/24 157 lb 1.9 oz (71.3 kg)    Diabetic Foot Exam - Simple   No data filed    Lab Results  Component Value Date   WBC 2.0 (L) 09/19/2024   HGB 10.3 (L) 09/19/2024   HCT 29.2 (L) 09/19/2024   PLT 162 09/19/2024   GLUCOSE 112 (H) 09/19/2024   CHOL 195 04/25/2024   TRIG 146 04/25/2024   HDL 48 04/25/2024   LDLDIRECT 135.7 02/11/2012   LDLCALC 118 (H) 04/25/2024   ALT 15 09/19/2024   AST 26 09/19/2024   NA 139 09/19/2024   K 3.8 09/19/2024   CL 104 09/19/2024   CREATININE 1.44 (H) 09/19/2024   BUN 16 09/19/2024   CO2 26 09/19/2024   TSH 4.080 04/25/2024   HGBA1C 6.4 09/15/2021    Lab Results  Component Value Date  TSH 4.080 04/25/2024   Lab Results  Component Value Date   WBC 2.0 (L) 09/19/2024   HGB 10.3 (L) 09/19/2024   HCT 29.2 (L) 09/19/2024   MCV 109.4 (H) 09/19/2024   PLT 162 09/19/2024   Lab Results  Component Value Date   NA 139 09/19/2024   K 3.8 09/19/2024   CO2 26 09/19/2024   GLUCOSE 112 (H) 09/19/2024   BUN 16 09/19/2024   CREATININE 1.44 (H) 09/19/2024   BILITOT 0.4 09/19/2024   ALKPHOS 65 09/19/2024   AST 26 09/19/2024   ALT 15 09/19/2024   PROT 6.2 (L) 09/19/2024   ALBUMIN 3.6 09/19/2024   CALCIUM  8.6 (L) 09/19/2024   ANIONGAP 10 09/19/2024   GFR 46.33 (L) 03/11/2023   Lab Results  Component Value Date   CHOL 195 04/25/2024   Lab Results  Component Value Date   HDL 48 04/25/2024   Lab Results  Component Value Date   LDLCALC 118 (H) 04/25/2024   Lab Results  Component Value Date   TRIG 146 04/25/2024   Lab Results  Component Value Date   CHOLHDL 4.1 04/25/2024   Lab Results  Component Value Date   HGBA1C 6.4 09/15/2021       Assessment & Plan:  Need for influenza vaccination -     Flu vaccine HIGH DOSE PF(Fluzone Trivalent)  Benign paroxysmal positional vertigo, unspecified laterality -     Meclizine  HCl; Take 1 tablet (12.5 mg total) by mouth 3 (three) times daily as needed for dizziness.  Dispense: 30  tablet; Refill: 4  Lower extremity edema -     hydroCHLOROthiazide ; Take 1 tablet (25 mg total) by mouth daily.  Dispense: 90 tablet; Refill: 3  Essential hypertension -     NONFORMULARY OR COMPOUNDED ITEM; Cbcd, cmp, tsh, lipid ---  dx htn,, hyperlipidemia  Dispense: 1 each; Refill: 0  Primary hypertension -     NONFORMULARY OR COMPOUNDED ITEM; Cbcd, cmp, tsh, lipid ---  dx htn,, hyperlipidemia  Dispense: 1 each; Refill: 0  Preventative health care Assessment & Plan: Ghm utd Check labs  See AVS  Health Maintenance  Topic Date Due   DTaP/Tdap/Td (3 - Td or Tdap) 04/17/2023   Medicare Annual Wellness (AWV)  07/29/2023   Influenza Vaccine  07/27/2024   COVID-19 Vaccine (6 - 2025-26 season) 10/24/2024 (Originally 08/27/2024)   Mammogram  10/08/2025 (Originally 09/02/2023)   DEXA SCAN  10/08/2025 (Originally 09/01/2024)   Colonoscopy  10/08/2025 (Originally 07/25/2022)   Pneumococcal Vaccine: 50+ Years  Completed   Hepatitis C Screening  Completed   Zoster Vaccines- Shingrix   Completed   Meningococcal B Vaccine  Aged Out   Hepatitis B Vaccines 19-59 Average Risk  Discontinued       Assessment and Plan Assessment & Plan Encounter for immunization   She is due for her annual flu vaccination and has experienced recent side effects from the COVID-19 vaccine. There was a discussion about the RSV vaccine, considering her cancer history. Administer the flu shot today and consult with her oncologist regarding the RSV vaccine before administration.  Breast cancer, status post bilateral mastectomy and radiation therapy   She has completed bilateral mastectomy and radiation therapy. There is no family history of breast cancer, and although a genetic mutation is present, no further genetic testing is pursued due to the lack of direct family benefit. Mammograms are not performed due to the mastectomy and absence of issues in the remaining breast.  Presence of vascular access device (port)  A  vascular access device is in place. The port is flushed daily with saline and heparin , although the cancer center no longer uses heparin . She prefers labs drawn through the port at the cancer center. Print a lab order for port access at the cancer center and continue daily flushing of the port with saline and heparin .  Hypertension with lower extremity edema   Her blood pressure is well-controlled with the current medication regimen. She experiences lower extremity edema, particularly in the left leg, which is slightly larger than the right. She is on Ziac , which contains a diuretic, and prefers to continue it. An additional diuretic is prescribed as needed for edema. Continue Ziac  as prescribed, prescribe an additional diuretic to be taken as needed for edema, and monitor blood pressure and edema.  General Health Maintenance   She is up to date with eye and dental check-ups and has declined further colonoscopy screenings due to her cancer history, prioritizing quality of life. A bone density scan was last done two years ago, and she questions its necessity given regular PET scans. Discuss the necessity of a bone density scan with her oncologist and continue regular eye and dental check-ups.   Kariem Wolfson R Lowne Chase, DO

## 2024-10-10 ENCOUNTER — Inpatient Hospital Stay

## 2024-10-10 DIAGNOSIS — C50911 Malignant neoplasm of unspecified site of right female breast: Secondary | ICD-10-CM | POA: Diagnosis not present

## 2024-10-10 NOTE — Patient Instructions (Signed)
 PICC Home Care Guide A peripherally inserted central catheter (PICC) is a form of IV access that allows medicines and IV fluids to be quickly put into the blood and spread throughout the body. The PICC is a long, thin, flexible tube (catheter) that is put into a vein in a person's arm or leg. The catheter ends in a large vein just outside the heart called the superior vena cava (SVC). After the PICC is put in, a chest X-ray may be done to make sure that it is in the right place. A PICC may be placed for different reasons, such as: To give medicines and liquid nutrition. To give IV fluids and blood products. To take blood samples often. If there is trouble placing a peripheral intravenous (PIV) catheter. If cared for properly, a PICC can remain in place for many months. Having a PICC can allow you to go home from the hospital sooner and continue treatment at home. Medicines and PICC care can be managed at home by a family member, caregiver, or home health care team. What are the risks? Generally, having a PICC is safe. However, problems may occur, including: A blood clot (thrombus) forming in or at the end of the PICC. A blood clot forming in a vein (deep vein thrombosis) or traveling to the lung (pulmonary embolism). Inflammation of the vein (phlebitis) in which the PICC is placed. Infection at the insertion site or in the blood. Blood infections from central lines, like PICCs, can be serious and often require a hospital stay. PICC malposition, or PICC movement or poor placement. A break or cut in the PICC. Do not use scissors near the PICC. Nerve or tendon irritation or injury during PICC insertion. How to care for your PICC Please follow the specific guidelines provided by your health care provider. Preventing infection You and any caregivers should wash your hands often with soap and water for at least 20 seconds. Wash hands: Before touching the PICC or the infusion device. Before changing a  bandage (dressing). Do not change the dressing unless you have been taught to do so and have shown you are able to change it safely. Flush the PICC as told. Tell your health care provider right away if the PICC is hard to flush or does not flush. Do not use force to flush the PICC. Use clean and germ-free (sterile) supplies only. Keep the supplies in a dry place. Do not reuse needles, syringes, or any other supplies. Reusing supplies can lead to infection. Keep the PICC dressing dry and secure it with tape if the edges stop sticking to your skin. Check your PICC insertion site every day for signs of infection. Check for: Redness, swelling, or pain. Fluid or blood. Warmth. Pus or a bad smell. Preventing other problems Do not use a syringe that is less than 10 mL to flush the PICC. Do not have your blood pressure checked on the arm in which the PICC is placed. Do not ever pull or tug on the PICC. Keep it secured to your arm with tape or a stretch wrap when not in use. Do not take the PICC out yourself. Only a trained health care provider should remove the PICC. Keep pets and children away from your PICC. How to care for your PICC dressing Keep your PICC dressing clean and dry to prevent infection. Do not take baths, swim, or use a hot tub until your health care provider approves. Ask your health care provider if you can take  showers. You may only be allowed to take sponge baths. When you are allowed to shower: Ask your health care provider to teach you how to wrap the PICC. Cover the PICC with clear plastic wrap and tape to keep it dry while showering. Follow instructions from your health care provider about how to take care of your insertion site and dressing. Make sure you: Wash your hands with soap and water for at least 20 seconds before and after you change your dressing. If soap and water are not available, use hand sanitizer. Change your dressing only if taught to do so by your health care  provider. Your PICC dressing needs to be changed if it becomes loose or wet. Leave stitches (sutures), skin glue, or adhesive strips in place. These skin closures may need to stay in place for 2 weeks or longer. If adhesive strip edges start to loosen and curl up, you may trim the loose edges. Do not remove adhesive strips completely unless your health care provider tells you to do that. Follow these instructions at home: Disposal of supplies Throw away any syringes in a disposal container that is meant for sharp items (sharps container). You can buy a sharps container from a pharmacy, or you can make one by using an empty, hard plastic bottle with a lid. Place any used dressings or infusion bags into a plastic bag. Throw that bag in the trash. General instructions  Always carry your PICC identification card or wear a medical alert bracelet. Keep the tube clamped at all times, unless it is being used. Always carry a smooth-edge clamp with you to clamp the PICC if it breaks. Do not use scissors or sharp objects near the tube. You may bend your arm and move it freely. If your PICC is near or at the bend of your elbow, avoid activity with repeated motion at the elbow. Avoid lifting heavy objects as told by your health care provider. Keep all follow-up visits. This is important. You will need to have your PICC dressing changed at least once a week. Contact a health care provider if: You have pain in your arm, ear, face, or teeth. You have a fever or chills. You have redness, swelling, or pain around the insertion site. You have fluid or blood coming from the insertion site. Your insertion site feels warm to the touch. You have pus or a bad smell coming from the insertion site. Your skin feels hard and raised around the insertion site. Your PICC dressing has gotten wet or is coming off and you have not been taught how to change it. Get help right away if: You have problems with your PICC, such as  your PICC: Was tugged or pulled and has partially come out. Do not  push the PICC back in. Cannot be flushed, is hard to flush, or leaks around the insertion site when it is flushed. Makes a flushing sound when it is flushed. Appears to have a hole or tear. Is accidentally pulled all the way out. If this happens, cover the insertion site with a gauze dressing. Do not throw the PICC away. Your health care provider will need to check it to be sure the entire catheter came out. You feel your heart racing or skipping beats, or you have chest pain. You have shortness of breath or trouble breathing. You have swelling, redness, warmth, or pain in the arm in which the PICC is placed. You have a red streak going up your arm that  starts under the PICC dressing. These symptoms may be an emergency. Get help right away. Call 911. Do not wait to see if the symptoms will go away. Do not drive yourself to the hospital. Summary A peripherally inserted central catheter (PICC) is a long, thin, flexible tube (catheter) that is put into a vein in the arm or leg. If cared for properly, a PICC can remain in place for many months. Having a PICC can allow you to go home from the hospital sooner and continue treatment at home. The PICC is inserted using a germ-free (sterile) technique by a specially trained health care provider. Only a trained health care provider should remove it. Do not have your blood pressure checked on the arm in which your PICC is placed. Always keep your PICC identification card with you. This information is not intended to replace advice given to you by your health care provider. Make sure you discuss any questions you have with your health care provider. Document Revised: 07/01/2021 Document Reviewed: 07/01/2021 Elsevier Patient Education  2024 [employer].

## 2024-10-15 ENCOUNTER — Other Ambulatory Visit (HOSPITAL_COMMUNITY): Payer: Self-pay

## 2024-10-15 NOTE — Progress Notes (Signed)
 Specialty Pharmacy Refill Coordination Note  Tracy Thompson is a 73 y.o. female contacted today regarding refills of specialty medication(s) Ribociclib  Succinate (KISQALI  400mg  Daily Dose)   Patient requested Delivery   Delivery date: 10/23/24   Verified address: [address] [place] [address]   Medication will be filled on 10/22/24.

## 2024-10-17 ENCOUNTER — Ambulatory Visit

## 2024-10-17 ENCOUNTER — Other Ambulatory Visit

## 2024-10-17 ENCOUNTER — Inpatient Hospital Stay

## 2024-10-17 ENCOUNTER — Ambulatory Visit: Admitting: Medical Oncology

## 2024-10-17 DIAGNOSIS — C50911 Malignant neoplasm of unspecified site of right female breast: Secondary | ICD-10-CM | POA: Diagnosis not present

## 2024-10-17 NOTE — Patient Instructions (Signed)
 PICC Home Care Guide A peripherally inserted central catheter (PICC) is a form of IV access that allows medicines and IV fluids to be quickly put into the blood and spread throughout the body. The PICC is a long, thin, flexible tube (catheter) that is put into a vein in a person's arm or leg. The catheter ends in a large vein just outside the heart called the superior vena cava (SVC). After the PICC is put in, a chest X-ray may be done to make sure that it is in the right place. A PICC may be placed for different reasons, such as: To give medicines and liquid nutrition. To give IV fluids and blood products. To take blood samples often. If there is trouble placing a peripheral intravenous (PIV) catheter. If cared for properly, a PICC can remain in place for many months. Having a PICC can allow you to go home from the hospital sooner and continue treatment at home. Medicines and PICC care can be managed at home by a family member, caregiver, or home health care team. What are the risks? Generally, having a PICC is safe. However, problems may occur, including: A blood clot (thrombus) forming in or at the end of the PICC. A blood clot forming in a vein (deep vein thrombosis) or traveling to the lung (pulmonary embolism). Inflammation of the vein (phlebitis) in which the PICC is placed. Infection at the insertion site or in the blood. Blood infections from central lines, like PICCs, can be serious and often require a hospital stay. PICC malposition, or PICC movement or poor placement. A break or cut in the PICC. Do not use scissors near the PICC. Nerve or tendon irritation or injury during PICC insertion. How to care for your PICC Please follow the specific guidelines provided by your health care provider. Preventing infection You and any caregivers should wash your hands often with soap and water for at least 20 seconds. Wash hands: Before touching the PICC or the infusion device. Before changing a  bandage (dressing). Do not change the dressing unless you have been taught to do so and have shown you are able to change it safely. Flush the PICC as told. Tell your health care provider right away if the PICC is hard to flush or does not flush. Do not use force to flush the PICC. Use clean and germ-free (sterile) supplies only. Keep the supplies in a dry place. Do not reuse needles, syringes, or any other supplies. Reusing supplies can lead to infection. Keep the PICC dressing dry and secure it with tape if the edges stop sticking to your skin. Check your PICC insertion site every day for signs of infection. Check for: Redness, swelling, or pain. Fluid or blood. Warmth. Pus or a bad smell. Preventing other problems Do not use a syringe that is less than 10 mL to flush the PICC. Do not have your blood pressure checked on the arm in which the PICC is placed. Do not ever pull or tug on the PICC. Keep it secured to your arm with tape or a stretch wrap when not in use. Do not take the PICC out yourself. Only a trained health care provider should remove the PICC. Keep pets and children away from your PICC. How to care for your PICC dressing Keep your PICC dressing clean and dry to prevent infection. Do not take baths, swim, or use a hot tub until your health care provider approves. Ask your health care provider if you can take  showers. You may only be allowed to take sponge baths. When you are allowed to shower: Ask your health care provider to teach you how to wrap the PICC. Cover the PICC with clear plastic wrap and tape to keep it dry while showering. Follow instructions from your health care provider about how to take care of your insertion site and dressing. Make sure you: Wash your hands with soap and water for at least 20 seconds before and after you change your dressing. If soap and water are not available, use hand sanitizer. Change your dressing only if taught to do so by your health care  provider. Your PICC dressing needs to be changed if it becomes loose or wet. Leave stitches (sutures), skin glue, or adhesive strips in place. These skin closures may need to stay in place for 2 weeks or longer. If adhesive strip edges start to loosen and curl up, you may trim the loose edges. Do not remove adhesive strips completely unless your health care provider tells you to do that. Follow these instructions at home: Disposal of supplies Throw away any syringes in a disposal container that is meant for sharp items (sharps container). You can buy a sharps container from a pharmacy, or you can make one by using an empty, hard plastic bottle with a lid. Place any used dressings or infusion bags into a plastic bag. Throw that bag in the trash. General instructions  Always carry your PICC identification card or wear a medical alert bracelet. Keep the tube clamped at all times, unless it is being used. Always carry a smooth-edge clamp with you to clamp the PICC if it breaks. Do not use scissors or sharp objects near the tube. You may bend your arm and move it freely. If your PICC is near or at the bend of your elbow, avoid activity with repeated motion at the elbow. Avoid lifting heavy objects as told by your health care provider. Keep all follow-up visits. This is important. You will need to have your PICC dressing changed at least once a week. Contact a health care provider if: You have pain in your arm, ear, face, or teeth. You have a fever or chills. You have redness, swelling, or pain around the insertion site. You have fluid or blood coming from the insertion site. Your insertion site feels warm to the touch. You have pus or a bad smell coming from the insertion site. Your skin feels hard and raised around the insertion site. Your PICC dressing has gotten wet or is coming off and you have not been taught how to change it. Get help right away if: You have problems with your PICC, such as  your PICC: Was tugged or pulled and has partially come out. Do not  push the PICC back in. Cannot be flushed, is hard to flush, or leaks around the insertion site when it is flushed. Makes a flushing sound when it is flushed. Appears to have a hole or tear. Is accidentally pulled all the way out. If this happens, cover the insertion site with a gauze dressing. Do not throw the PICC away. Your health care provider will need to check it to be sure the entire catheter came out. You feel your heart racing or skipping beats, or you have chest pain. You have shortness of breath or trouble breathing. You have swelling, redness, warmth, or pain in the arm in which the PICC is placed. You have a red streak going up your arm that  starts under the PICC dressing. These symptoms may be an emergency. Get help right away. Call 911. Do not wait to see if the symptoms will go away. Do not drive yourself to the hospital. Summary A peripherally inserted central catheter (PICC) is a long, thin, flexible tube (catheter) that is put into a vein in the arm or leg. If cared for properly, a PICC can remain in place for many months. Having a PICC can allow you to go home from the hospital sooner and continue treatment at home. The PICC is inserted using a germ-free (sterile) technique by a specially trained health care provider. Only a trained health care provider should remove it. Do not have your blood pressure checked on the arm in which your PICC is placed. Always keep your PICC identification card with you. This information is not intended to replace advice given to you by your health care provider. Make sure you discuss any questions you have with your health care provider. Document Revised: 07/01/2021 Document Reviewed: 07/01/2021 Elsevier Patient Education  2024 [employer].

## 2024-10-20 ENCOUNTER — Other Ambulatory Visit: Payer: Self-pay | Admitting: Hematology & Oncology

## 2024-10-22 ENCOUNTER — Other Ambulatory Visit: Payer: Self-pay

## 2024-10-22 ENCOUNTER — Ambulatory Visit: Admitting: *Deleted

## 2024-10-22 ENCOUNTER — Telehealth: Payer: Self-pay | Admitting: *Deleted

## 2024-10-22 VITALS — Ht 66.0 in | Wt 159.0 lb

## 2024-10-22 DIAGNOSIS — C50911 Malignant neoplasm of unspecified site of right female breast: Secondary | ICD-10-CM | POA: Diagnosis not present

## 2024-10-22 DIAGNOSIS — Z0000 Encounter for general adult medical examination without abnormal findings: Secondary | ICD-10-CM | POA: Diagnosis not present

## 2024-10-22 DIAGNOSIS — C7951 Secondary malignant neoplasm of bone: Secondary | ICD-10-CM

## 2024-10-22 DIAGNOSIS — Z780 Asymptomatic menopausal state: Secondary | ICD-10-CM

## 2024-10-22 NOTE — Progress Notes (Signed)
 Subjective:   Tracy Thompson is a 73 y.o. who presents for a Medicare Wellness preventive visit.  As a reminder, Annual Wellness Visits don't include a physical exam, and some assessments may be limited, especially if this visit is performed virtually. We may recommend an in-person follow-up visit with your provider if needed.  Visit Complete: Virtual I connected with  Tracy Thompson on 10/22/24 by a audio enabled telemedicine application and verified that I am speaking with the correct person using two identifiers.  Patient Location: Home  Provider Location: Office/Clinic  I discussed the limitations of evaluation and management by telemedicine. The patient expressed understanding and agreed to proceed.  Vital Signs: Because this visit was a virtual/telehealth visit, some criteria may be missing or patient reported. Any vitals not documented were not able to be obtained and vitals that have been documented are patient reported.  VideoDeclined- This patient declined Librarian, academic. Therefore the visit was completed with audio only.  Persons Participating in Visit: Patient.  AWV Questionnaire: No: Patient Medicare AWV questionnaire was not completed prior to this visit.  Cardiac Risk Factors include: advanced age (>19men, >37 women);dyslipidemia;hypertension;Other (see comment), Risk factor comments: hx of breast cancer     Objective:    Today's Vitals   10/22/24 1501 10/22/24 1508  Weight: 159 lb (72.1 kg)   Height: 5' 6 (1.676 m)   PainSc:  0-No pain   Body mass index is 25.66 kg/m.     10/22/2024    3:35 PM 10/10/2024    2:09 PM 10/03/2024    2:18 PM 09/19/2024   12:57 PM 08/22/2024   12:31 PM 07/25/2024   10:32 AM 06/20/2024   11:08 AM  Advanced Directives  Does Patient Have a Medical Advance Directive? Yes Yes Yes Yes Yes Yes Yes  Type of Estate Agent of [place];Living will Healthcare Power of [place];Living  will Healthcare Power of [place];Living will Living will;Healthcare Power of [employer] Power of [place];Living will Healthcare Power of [place];Living will Healthcare Power of [place];Living will  Does patient want to make changes to medical advance directive? No - Patient declined No - Patient declined No - Patient declined No - Patient declined No - Patient declined No - Patient declined   Copy of Healthcare Power of Attorney in Chart? Yes - validated most recent copy scanned in chart (See row information) No - copy requested  No - copy requested No - copy requested No - copy requested No - copy requested  Would patient like information on creating a medical advance directive? No - Patient declined No - Patient declined No - Patient declined No - Patient declined No - Patient declined No - Patient declined No - Patient declined    Current Medications (verified) Outpatient Encounter Medications as of 10/22/2024  Medication Sig   aspirin 81 MG tablet Take 81 mg by mouth daily.   bisacodyl (DULCOLAX) 5 MG EC tablet Take 5 mg by mouth daily as needed for moderate constipation.   bisoprolol -hydrochlorothiazide  (ZIAC ) 10-6.25 MG tablet Take 1 tablet by mouth daily.   carboxymethylcellulose (REFRESH PLUS) 0.5 % SOLN Place 1 drop into both eyes 3 (three) times daily as needed.   Cholecalciferol (VITAMIN D ) 50 MCG (2000 UT) CAPS Take 2,000 Units by mouth daily.   Cyanocobalamin  (VITAMIN B 12) 500 MCG TABS Take 1,000 mcg by mouth daily.   fulvestrant  (FASLODEX ) 250 MG/5ML injection Inject 500 mg into the muscle every 30 (thirty) days. One injection  each buttock over 1-2 minutes. Warm prior to use.   Heparin  Na, Pork, Lock Flsh PF 100 UNIT/ML SOLN Inject 2.5 mLs (250 Units total) into the vein every 12 (twelve) hours.   hydrochlorothiazide  (HYDRODIURIL ) 25 MG tablet Take 1 tablet (25 mg total) by mouth daily.   meclizine  (ANTIVERT ) 12.5 MG tablet Take 1 tablet (12.5 mg total) by mouth 3  (three) times daily as needed for dizziness.   OLANZapine  (ZYPREXA ) 5 MG tablet TAKE 1 TABLET(5 MG) BY MOUTH AT BEDTIME   ondansetron  (ZOFRAN ) 8 MG tablet TAKE 1 TABLET(8 MG) BY MOUTH EVERY 8 HOURS AS NEEDED FOR NAUSEA OR VOMITING   potassium chloride  SA (KLOR-CON  M) 20 MEQ tablet Take 1 tablet (20 mEq total) by mouth 2 (two) times daily. (Patient taking differently: Take 20 mEq by mouth 2 (two) times daily. Taking 1/2 tablet twice a day)   ribociclib  succ (KISQALI  400MG  DAILY DOSE) 200 MG Therapy Pack Take 2 tablets (400 mg total) by mouth daily. Take for 21 days on, 7 days off, repeat every 28 days.   sodium chloride  flush 0.9 % SOLN injection Inject 10 mLs into the vein every 12 (twelve) hours.   traMADol  (ULTRAM ) 50 MG tablet Take 1 tablet (50 mg total) by mouth every 6 (six) hours as needed.   zoledronic  acid (ZOMETA ) 4 MG/5ML injection Inject 4 mg into the vein every 3 (three) months.   NONFORMULARY OR COMPOUNDED ITEM Cbcd, cmp, tsh, lipid ---  dx htn,, hyperlipidemia   No facility-administered encounter medications on file as of 10/22/2024.    Allergies (verified) Patient has no known allergies.   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>C (p.[id number])    Past Surgical History:  Procedure Laterality Date   BREAST LUMPECTOMY Right 1994   w/ radiation and chemotherapy following lumpectomy   BRONCHIAL BIOPSY  11/10/2022   Procedure: BRONCHIAL BIOPSIES;   Surgeon: Alaine Vicenta NOVAK, MD;  Location: [facility] ENDOSCOPY;  Service: Cardiopulmonary;;   BRONCHIAL BRUSHINGS  11/10/2022   Procedure: BRONCHIAL BRUSHINGS;  Surgeon: Alaine Vicenta NOVAK, MD;  Location: [facility] ENDOSCOPY;  Service: Cardiopulmonary;;   BRONCHIAL WASHINGS  11/10/2022   Procedure: BRONCHIAL WASHINGS;  Surgeon: Alaine Vicenta NOVAK, MD;  Location: [facility] ENDOSCOPY;  Service: Cardiopulmonary;;   CHOLECYSTECTOMY  06/20/2002   Laparoscopic cholestectomy   IR FLUORO GUIDE CV LINE LEFT  07/08/2023   IR IMAGING GUIDED PORT INSERTION  07/08/2023   IR US  GUIDE VASC ACCESS LEFT  07/08/2023   IR VENO/EXT/UNI LEFT  07/08/2023   IR VENO/JUGULAR LEFT  07/08/2023   TOTAL MASTECTOMY Right 04/12/2023   Procedure: RIGHT TOTAL MASTECTOMY;  Surgeon: Ebbie Cough, MD;  Location: [facility] OR;  Service: General;  Laterality: Right;   varicose veins Bilateral 2013   laser surgery   Dr. Audrey   VIDEO BRONCHOSCOPY WITH ENDOBRONCHIAL ULTRASOUND N/A 11/10/2022   Procedure: VIDEO BRONCHOSCOPY WITH  ENDOBRONCHIAL ULTRASOUND;  Surgeon: Alaine Vicenta NOVAK, MD;  Location: [facility] ENDOSCOPY;  Service: Cardiopulmonary;  Laterality: N/A;   Family History  Problem Relation Age of Onset   Colon cancer Mother 35 - 88   Stroke Mother    Hypertension Mother    Hyperlipidemia Mother    Hepatitis Mother        hep A   COPD Mother    Dementia Mother    Bladder Cancer Mother 22   Hypertension Father    Prostate cancer Father    Diabetes Father    Stroke Father    Pulmonary embolism Father    Deep vein thrombosis Father        PE   Schizophrenia Brother    Mental illness Brother    COPD Brother    Hepatitis C Brother    Liver disease Other    Social History   Socioeconomic History   Marital status: Married    Spouse name: LASHANNA ANGELO   Number of children: Not on file   Years of education: Not on file   Highest education level: Not on file  Occupational History   Occupation: retired Magazine Features Editor: RETIRED   Tobacco Use   Smoking status: Never   Smokeless tobacco: Never  Vaping Use   Vaping status: Never Used  Substance and Sexual Activity   Alcohol use: No   Drug use: No   Sexual activity: Not Currently    Partners: Male  Other Topics Concern   Not on file  Social History Narrative   Exercise---  no ,    Social Drivers of Health   Financial Resource Strain: Low Risk  (10/22/2024)   Overall Financial Resource Strain (CARDIA)    Difficulty of Paying Living Expenses: Not hard at all  Food Insecurity: No Food Insecurity (10/22/2024)   Hunger Vital Sign    Worried About Running Out of Food in the Last Year: Never true    Ran Out of Food in the Last Year: Never true  Transportation Needs: No Transportation Needs (10/22/2024)   PRAPARE - Administrator, Civil Service (Medical): No    Lack of Transportation (Non-Medical): No  Physical Activity: Inactive (10/22/2024)   Exercise Vital Sign    Days of Exercise per Week: 0 days    Minutes of Exercise per Session: 0 min  Stress: No Stress Concern Present (10/22/2024)   [employer] of Occupational Health - Occupational Stress Questionnaire    Feeling of Stress: Not at all  Social Connections: Moderately Isolated (10/22/2024)   Social Connection and Isolation Panel    Frequency of Communication with Friends and Family: Three times a week    Frequency of Social Gatherings with Friends and Family: Once a week    Attends Religious Services: Never    Database Administrator or Organizations: No    Attends Engineer, Structural: Never    Marital Status: Married    Tobacco Counseling Counseling given: Not Answered    Clinical Intake:  Pre-visit preparation completed: Yes  Pain : No/denies pain Pain Score: 0-No pain     BMI - recorded: 25.66 Nutritional Status: BMI 25 -29 Overweight Nutritional Risks: None Diabetes: No  Lab Results  Component Value Date   HGBA1C 6.4 09/15/2021   HGBA1C 6.4  03/20/2021   HGBA1C 6.4 07/08/2020     How often do you need to have someone help you when you read instructions, pamphlets, or other written  materials from your doctor or pharmacy?: 1 - Never  Interpreter Needed?: No  Information entered by :: lolita Libra, [facility])   Activities of Daily Living     10/22/2024    3:10 PM  In your present state of health, do you have any difficulty performing the following activities:  Hearing? 0  Vision? 0  Difficulty concentrating or making decisions? 1  Walking or climbing stairs? 0  Dressing or bathing? 1  Comment spouse assists  Doing errands, shopping? 0  Comment spouse does grocery shopping  Preparing Food and eating ? N  Using the Toilet? N  In the past six months, have you accidently leaked urine? N  Do you have problems with loss of bowel control? N  Managing your Medications? N  Managing your Finances? N  Housekeeping or managing your Housekeeping? N    Patient Care Team: Antonio Meth, Jamee SAUNDERS, DO as PCP - General Audrey Mcardle, MD as Consulting Physician (Family Medicine) Dermatology, [facility], MD as Consulting Physician (Ophthalmology) Timmy Maude SAUNDERS, MD as Medical Oncologist (Oncology)  I have updated your Care Teams any recent Medical Services you may have received from other providers in the past year.     Assessment:   This is a routine wellness examination for Tracy Thompson.  Hearing/Vision screen Hearing Screening - Comments:: Denies hearing difficulties.  Vision Screening - Comments:: Up to date with routine eye exams with [facility]   Goals Addressed             This Visit's Progress    Patient Stated   Not on track    Drink more water       Depression Screen     10/22/2024    3:15 PM 10/10/2024    2:10 PM 10/03/2024    2:19 PM 09/26/2024    2:31 PM 09/19/2024    1:00 PM 09/05/2024    2:30 PM 08/29/2024    2:26 PM  PHQ 2/9 Scores  PHQ - 2 Score 0 0 0 0 0 0  0  PHQ- 9 Score 2          Fall Risk     10/22/2024    3:19 PM 09/30/2023    9:00 AM 09/21/2022    9:39 AM 07/28/2022    9:03 AM 03/22/2022    9:45 AM  Fall Risk   Falls in the past year? 0 1 0 0 0  Number falls in past yr: 0 0 0 0 0  Injury with Fall? 0 1 0 0 0  Risk for fall due to : Medication side effect History of fall(s);Impaired balance/gait Impaired balance/gait No Fall Risks   Risk for fall due to: Comment weakness from cancer treatments      Follow up Education provided Falls evaluation completed Falls evaluation completed  Falls evaluation completed       Data saved with a previous flowsheet row definition    MEDICARE RISK AT HOME:  Medicare Risk at Home Any stairs in or around the home?: No If so, are there any without handrails?: No Home free of loose throw rugs in walkways, pet beds, electrical cords, etc?: Yes Adequate lighting in your home to reduce risk of falls?: Yes Life alert?: No Use of a cane, walker or w/c?: No Grab bars in the bathroom?: No Shower chair or bench in shower?: Yes Elevated toilet seat or a handicapped toilet?: Yes  TIMED UP AND GO:  Was the test performed?  No,audio  Cognitive Function: 6CIT completed    06/12/2018    1:36 PM  MMSE - Mini Mental State Exam  Orientation to time 5  Orientation to Place 5  Registration 3  Attention/ Calculation 5  Recall 3  Language- name 2 objects 2  Language- repeat 1  Language- follow 3 step command 3  Language- read & follow direction 1  Write a sentence 1  Copy design 1  Total score 30        10/22/2024    3:20 PM 07/28/2022    9:12 AM  6CIT Screen  What Year? 0 points 0 points  What month? 0 points 0 points  What time? 0 points 0 points  Count back from 20 0 points 0 points  Months in reverse 0 points 0 points  Repeat phrase 0 points 0 points  Total Score 0 points 0 points    Immunizations Immunization History  Administered Date(s) Administered   Fluad Quad(high Dose 65+)  09/11/2019, 10/07/2020, 10/02/2021, 09/21/2022   Fluad Trivalent(High Dose 65+) 09/30/2023   Hep A / Hep B 01/26/2011, 03/30/2011, 07/29/2011   INFLUENZA, HIGH DOSE SEASONAL PF 10/11/2017, 10/16/2018, 10/08/2024   Influenza Split 09/20/2011, 10/17/2012   Influenza Whole 09/26/2008, 10/08/2009, 10/19/2010   Influenza,inj,Quad PF,6+ Mos 10/08/2013, 10/08/2014, 10/16/2015, 10/12/2016   PFIZER Comirnaty(Gray Top)Covid-19 Tri-Sucrose Vaccine 05/26/2021   PFIZER(Purple Top)SARS-COV-2 Vaccination 02/09/2020, 03/03/2020, 11/11/2020   Pfizer Covid-19 Vaccine Bivalent Booster 79yrs & up 10/23/2021   Pneumococcal Conjugate-13 06/12/2018   Pneumococcal Polysaccharide-23 04/22/2015, 03/20/2021   Td 07/02/2003   Tdap 04/16/2013   Zoster Recombinant(Shingrix ) 06/10/2017, 08/23/2017   Zoster, Live 10/31/2012    Screening Tests Health Maintenance  Topic Date Due   DTaP/Tdap/Td (3 - Td or Tdap) 04/17/2023   COVID-19 Vaccine (6 - 2025-26 season) 10/24/2024 (Originally 08/27/2024)   Mammogram  10/08/2025 (Originally 09/02/2023)   DEXA SCAN  10/08/2025 (Originally 09/01/2024)   Colonoscopy  10/08/2025 (Originally 07/25/2022)   Medicare Annual Wellness (AWV)  10/22/2025   Pneumococcal Vaccine: 50+ Years  Completed   Influenza Vaccine  Completed   Hepatitis C Screening  Completed   Zoster Vaccines- Shingrix   Completed   Meningococcal B Vaccine  Aged Out   Hepatitis B Vaccines 19-59 Average Risk  Discontinued    Health Maintenance Items Addressed: Will get tetanus vaccine at pharmacy. Dexa ordered.   Additional Screening:  Vision Screening: Recommended annual ophthalmology exams for early detection of glaucoma and other disorders of the eye. Is the patient up to date with their annual eye exam?  Yes  Who is the provider or what is the name of the office in which the patient attends annual eye exams? [facility]  Dental Screening: Recommended annual dental exams for proper oral  hygiene  Community Resource Referral / Chronic Care Management: CRR required this visit?  No   CCM required this visit?  No   Plan:    I have personally reviewed and noted the following in the patient's chart:   Medical and social history Use of alcohol, tobacco or illicit drugs  Current medications and supplements including opioid prescriptions. Patient is not currently taking opioid prescriptions. Functional ability and status Nutritional status Physical activity Advanced directives List of other physicians Hospitalizations, surgeries, and ER visits in previous 12 months Vitals Screenings to include cognitive, depression, and falls Referrals and appointments  In addition, I have reviewed and discussed with patient certain preventive protocols, quality metrics, and best practice recommendations. A written personalized care plan for preventive services as  well as general preventive health recommendations were provided to patient.   Lolita Libra, CMA   10/22/2024   After Visit Summary: (MyChart) Due to this being a telephonic visit, the after visit summary with patients personalized plan was offered to patient via MyChart   Notes: Nothing significant to report at this time.

## 2024-10-22 NOTE — Telephone Encounter (Signed)
 Pt is due for DEXA. She currently gets a Pet scan every 4 months. Wants to know if this would replace the DEXA? I told her I didn't think it would but would get clarification from PCP.  Please advise.

## 2024-10-22 NOTE — Patient Instructions (Signed)
 Ms. Crooke , Thank you for taking time out of your busy schedule to complete your Annual Wellness Visit with me. I enjoyed our conversation and look forward to speaking with you again next year. I, as well as your care team,  appreciate your ongoing commitment to your health goals. Please review the following plan we discussed and let me know if I can assist you in the future. Your Game plan/ To Do List    Referrals: If you haven't heard from the office you've been referred to, please reach out to them at the phone provided.   Bone Density ([facility]):  [phone]  Follow up Visits: Next Medicare AWV with our clinical staff: 10/24/25 3pm, telephone    Next Office Visit with your provider: 10/11/25 8:40am, Dr Antonio Meth, CPE fasting. Please drink plenty of water prior to the appointment to be well hydrated for the labwork.  Clinician Recommendations:  Aim for 30 minutes of exercise or brisk walking, 6-8 glasses of water, and 5 servings of fruits and vegetables each day.   You will need to get the following vaccines at your local pharmacy: Tetanus       This is a list of the screening recommended for you and due dates:  Health Maintenance  Topic Date Due   DTaP/Tdap/Td vaccine (3 - Td or Tdap) 04/17/2023   Medicare Annual Wellness Visit  07/29/2023   COVID-19 Vaccine (6 - 2025-26 season) 10/24/2024*   Breast Cancer Screening  10/08/2025*   DEXA scan (bone density measurement)  10/08/2025*   Colon Cancer Screening  10/08/2025*   Pneumococcal Vaccine for age over 3  Completed   Flu Shot  Completed   Hepatitis C Screening  Completed   Zoster (Shingles) Vaccine  Completed   Meningitis B Vaccine  Aged Out   Hepatitis B Vaccine  Discontinued  *Topic was postponed. The date shown is not the original due date.    Advanced directives: (In Chart) A copy of your advanced directives are scanned into your chart should your provider ever need it. Advance Care Planning is important  because it:  [x]  Makes sure you receive the medical care that is consistent with your values, goals, and preferences  [x]  It provides guidance to your family and loved ones and reduces their decisional burden about whether or not they are making the right decisions based on your wishes.  Follow the link provided in your after visit summary or read over the paperwork we have mailed to you to help you started getting your Advance Directives in place. If you need assistance in completing these, please reach out to us  so that we can help you!  See attachments for Preventive Care and Fall Prevention Tips.

## 2024-10-24 ENCOUNTER — Inpatient Hospital Stay

## 2024-10-24 ENCOUNTER — Inpatient Hospital Stay: Admitting: Hematology & Oncology

## 2024-10-24 ENCOUNTER — Encounter: Payer: Self-pay | Admitting: Hematology & Oncology

## 2024-10-24 VITALS — BP 146/83 | HR 83 | Temp 97.7°F | Resp 20 | Ht 66.0 in | Wt 155.0 lb

## 2024-10-24 DIAGNOSIS — C50911 Malignant neoplasm of unspecified site of right female breast: Secondary | ICD-10-CM | POA: Diagnosis not present

## 2024-10-24 DIAGNOSIS — C50919 Malignant neoplasm of unspecified site of unspecified female breast: Secondary | ICD-10-CM | POA: Diagnosis not present

## 2024-10-24 DIAGNOSIS — C7951 Secondary malignant neoplasm of bone: Secondary | ICD-10-CM

## 2024-10-24 LAB — CBC WITH DIFFERENTIAL (CANCER CENTER ONLY)
Abs Immature Granulocytes: 0.01 K/uL (ref 0.00–0.07)
Basophils Absolute: 0 K/uL (ref 0.0–0.1)
Basophils Relative: 2 %
Eosinophils Absolute: 0 K/uL (ref 0.0–0.5)
Eosinophils Relative: 1 %
HCT: 30.8 % — ABNORMAL LOW (ref 36.0–46.0)
Hemoglobin: 11 g/dL — ABNORMAL LOW (ref 12.0–15.0)
Immature Granulocytes: 1 %
Lymphocytes Relative: 29 %
Lymphs Abs: 0.5 K/uL — ABNORMAL LOW (ref 0.7–4.0)
MCH: 38.3 pg — ABNORMAL HIGH (ref 26.0–34.0)
MCHC: 35.7 g/dL (ref 30.0–36.0)
MCV: 107.3 fL — ABNORMAL HIGH (ref 80.0–100.0)
Monocytes Absolute: 0.2 K/uL (ref 0.1–1.0)
Monocytes Relative: 11 %
Neutro Abs: 1 K/uL — ABNORMAL LOW (ref 1.7–7.7)
Neutrophils Relative %: 56 %
Platelet Count: 195 K/uL (ref 150–400)
RBC: 2.87 MIL/uL — ABNORMAL LOW (ref 3.87–5.11)
RDW: 13.2 % (ref 11.5–15.5)
Smear Review: NORMAL
WBC Count: 1.7 K/uL — ABNORMAL LOW (ref 4.0–10.5)
nRBC: 0 % (ref 0.0–0.2)

## 2024-10-24 LAB — CMP (CANCER CENTER ONLY)
ALT: 21 U/L (ref 0–44)
AST: 32 U/L (ref 15–41)
Albumin: 3.9 g/dL (ref 3.5–5.0)
Alkaline Phosphatase: 80 U/L (ref 38–126)
Anion gap: 10 (ref 5–15)
BUN: 21 mg/dL (ref 8–23)
CO2: 29 mmol/L (ref 22–32)
Calcium: 9.3 mg/dL (ref 8.9–10.3)
Chloride: 100 mmol/L (ref 98–111)
Creatinine: 1.52 mg/dL — ABNORMAL HIGH (ref 0.44–1.00)
GFR, Estimated: 36 mL/min — ABNORMAL LOW (ref >60)
Glucose, Bld: 88 mg/dL (ref 70–99)
Potassium: 3.7 mmol/L (ref 3.5–5.1)
Sodium: 138 mmol/L (ref 135–145)
Total Bilirubin: 0.4 mg/dL (ref 0.0–1.2)
Total Protein: 7 g/dL (ref 6.5–8.1)

## 2024-10-24 MED ORDER — FULVESTRANT 250 MG/5ML IM SOSY
500.0000 mg | PREFILLED_SYRINGE | Freq: Once | INTRAMUSCULAR | Status: AC
  Administered 2024-10-24: 500 mg via INTRAMUSCULAR
  Filled 2024-10-24: qty 10

## 2024-10-24 NOTE — Telephone Encounter (Signed)
 Notified pt. She is agreeable to proceed with DEXA. Order placed for [facility] and pt given contact # to schedule appt.

## 2024-10-24 NOTE — Telephone Encounter (Signed)
 Left message for pt to return my call.

## 2024-10-24 NOTE — Patient Instructions (Signed)
 Fulvestrant Injection What is this medication? FULVESTRANT (ful VES trant) treats breast cancer. It works by blocking the hormone estrogen in breast tissue, which prevents breast cancer cells from spreading or growing. This medicine may be used for other purposes; ask your health care provider or pharmacist if you have questions. COMMON BRAND NAME(S): FASLODEX What should I tell my care team before I take this medication? They need to know if you have any of these conditions: Bleeding disorder Liver disease Low blood cell levels (white cells, red cells, and platelets) An unusual or allergic reaction to fulvestrant, other medications, foods, dyes, or preservatives Pregnant or trying to get pregnant Breastfeeding How should I use this medication? This medication is injected into a muscle. It is given by your care team in a hospital or clinic setting. Talk to your care team about the use of this medication in children. Special care may be needed. Overdosage: If you think you have taken too much of this medicine contact a poison control center or emergency room at once. NOTE: This medicine is only for you. Do not share this medicine with others. What if I miss a dose? Keep appointments for follow-up doses. It is important not to miss your dose. Call your care team if you are unable to keep an appointment. What may interact with this medication? Fluoroestradiol F18 This list may not describe all possible interactions. Give your health care provider a list of all the medicines, herbs, non-prescription drugs, or dietary supplements you use. Also tell them if you smoke, drink alcohol, or use illegal drugs. Some items may interact with your medicine. What should I watch for while using this medication? Your condition will be monitored carefully while you are receiving this medication. You may need blood work done while you are taking this medication. This medication is injected into a muscle. Talk  to your care team if you also take medications that prevent or treat blood clots, such as warfarin. Blood thinners may increase the risk of bleeding or bruising in the muscle where this medication is injected. The benefits of this medication may outweigh the risks. Your care team can help you find the option that works for you. They can also help limit the risk of bleeding. Talk to your care team if you may be pregnant. Serious birth defects can occur if you take this medication during pregnancy and for 1 year after the last dose. You will need a negative pregnancy test before starting this medication. Contraception is recommended while taking this medication and for 1 year after the last dose. Your care team can help you find the option that works for you. Do not breastfeed while taking this medication and for 1 year after the last dose. This medication may cause infertility. Talk to your care team if you are concerned about your fertility. What side effects may I notice from receiving this medication? Side effects that you should report to your care team as soon as possible: Allergic reactions or angioedema--skin rash, itching or hives, swelling of the face, eyes, lips, tongue, arms, or legs, trouble swallowing or breathing Pain, tingling, or numbness in the hands or feet Side effects that usually do not require medical attention (report to your care team if they continue or are bothersome): Bone, joint, or muscle pain Constipation Headache Hot flashes Nausea Pain, redness, or irritation at injection site Unusual weakness or fatigue This list may not describe all possible side effects. Call your doctor for medical advice about side  effects. You may report side effects to FDA at 1-800-FDA-1088. Where should I keep my medication? This medication is given in a hospital or clinic. It will not be stored at home. NOTE: This sheet is a summary. It may not cover all possible information. If you have  questions about this medicine, talk to your doctor, pharmacist, or health care provider.  2024 Elsevier/Gold Standard (2023-08-19 00:00:00)

## 2024-10-24 NOTE — Progress Notes (Signed)
 Hematology and Oncology Follow Up Visit  Tracy Thompson [id number] 10-02-1951 73 y.o. 10/24/2024   Principle Diagnosis:  Metastatic breast cancer-bone metastasis/hilar lymph node metastasis- ER+/PR+/HER-2 (2+) -- PIK3CA (+)  Current Therapy:   Faslodex  500 mg IM monthly -start on 11/09/2022 Ribociclib  600 mg p.o. daily (21/7) --start on 11/09/2022 --changed to 400 mg p.o. daily started on 09/05/2023 Zometa  4 mg IV every 3 months - next dose 10/2024 Radiation therapy to left hip -completed in 11/23/2022     Interim History:  Tracy Thompson is back for follow-up.  Overall, she is doing quite well.  We last saw her a month ago.  She is having no problems with the ribociclib .  She has had no nausea or vomiting.  She has had no bleeding.  She has had no fever.  She has had no change in bowel or bladder habits.  She does have a little bit of fatigue.  I am sure this is probably from the ribociclib .  I am a little bit troubled by the fact that her CA 27.29 was up to 52.  Will have to get a set of PET scans on her.  I think the last set of PET scans was done on 08/06/2024.    It sounds like they will be going to [place]  for Thanksgiving.  I am sure that they will have a good time.  Currently, I would say that her performance status is probably ECOG 1.    Wt Readings from Last 3 Encounters:  10/24/24 155 lb 0.6 oz (70.3 kg)  10/22/24 159 lb (72.1 kg)  10/08/24 159 lb 9.6 oz (72.4 kg)    Medications:  Current Outpatient Medications:    aspirin 81 MG tablet, Take 81 mg by mouth daily., Disp: , Rfl:    bisacodyl (DULCOLAX) 5 MG EC tablet, Take 5 mg by mouth daily as needed for moderate constipation., Disp: , Rfl:    bisoprolol -hydrochlorothiazide  (ZIAC ) 10-6.25 MG tablet, Take 1 tablet by mouth daily., Disp: 90 tablet, Rfl: 0   carboxymethylcellulose (REFRESH PLUS) 0.5 % SOLN, Place 1 drop into both eyes 3 (three) times daily as needed., Disp: , Rfl:    Cholecalciferol (VITAMIN D ) 50 MCG  (2000 UT) CAPS, Take 2,000 Units by mouth daily., Disp: , Rfl:    Cyanocobalamin  (VITAMIN B 12) 500 MCG TABS, Take 1,000 mcg by mouth daily., Disp: , Rfl:    fulvestrant  (FASLODEX ) 250 MG/5ML injection, Inject 500 mg into the muscle every 30 (thirty) days. One injection each buttock over 1-2 minutes. Warm prior to use., Disp: , Rfl:    Heparin  Na, Pork, Lock Flsh PF 100 UNIT/ML SOLN, Inject 2.5 mLs (250 Units total) into the vein every 12 (twelve) hours., Disp: 300 mL, Rfl: 3   hydrochlorothiazide  (HYDRODIURIL ) 25 MG tablet, Take 1 tablet (25 mg total) by mouth daily., Disp: 90 tablet, Rfl: 3   meclizine  (ANTIVERT ) 12.5 MG tablet, Take 1 tablet (12.5 mg total) by mouth 3 (three) times daily as needed for dizziness., Disp: 30 tablet, Rfl: 4   OLANZapine  (ZYPREXA ) 5 MG tablet, TAKE 1 TABLET(5 MG) BY MOUTH AT BEDTIME, Disp: 30 tablet, Rfl: 3   potassium chloride  SA (KLOR-CON  M) 20 MEQ tablet, Take 1 tablet (20 mEq total) by mouth 2 (two) times daily., Disp: 180 tablet, Rfl: 1   ribociclib  succ (KISQALI  400MG  DAILY DOSE) 200 MG Therapy Pack, Take 2 tablets (400 mg total) by mouth daily. Take for 21 days on, 7 days off, repeat every 28  days., Disp: 63 tablet, Rfl: 6   sodium chloride  flush 0.9 % SOLN injection, Inject 10 mLs into the vein every 12 (twelve) hours., Disp: 600 mL, Rfl: 3   traMADol  (ULTRAM ) 50 MG tablet, Take 1 tablet (50 mg total) by mouth every 6 (six) hours as needed., Disp: 90 tablet, Rfl: 0   zoledronic  acid (ZOMETA ) 4 MG/5ML injection, Inject 4 mg into the vein every 3 (three) months., Disp: , Rfl:    NONFORMULARY OR COMPOUNDED ITEM, Cbcd, cmp, tsh, lipid ---  dx htn,, hyperlipidemia, Disp: 1 each, Rfl: 0   ondansetron  (ZOFRAN ) 8 MG tablet, TAKE 1 TABLET(8 MG) BY MOUTH EVERY 8 HOURS AS NEEDED FOR NAUSEA OR VOMITING (Patient not taking: Reported on 10/24/2024), Disp: 30 tablet, Rfl: 2  Allergies: No Known Allergies  Past Medical History, Surgical history, Social history, and Family  History were reviewed and updated.  Review of Systems: Review of Systems  Constitutional:  Positive for fatigue.  HENT:  Negative.    Eyes: Negative.   Respiratory: Negative.    Cardiovascular: Negative.   Gastrointestinal:  Positive for constipation and nausea.  Endocrine: Negative.   Genitourinary: Negative.    Musculoskeletal:  Positive for arthralgias and myalgias.  Skin: Negative.   Neurological: Negative.   Hematological: Negative.   Psychiatric/Behavioral: Negative.      Physical Exam: Vitals:   10/24/24 1246  BP: (!) 146/83  Pulse: 83  Resp: 20  Temp: 97.7 F (36.5 C)  SpO2: 95%   Wt Readings from Last 3 Encounters:  10/24/24 155 lb 0.6 oz (70.3 kg)  10/22/24 159 lb (72.1 kg)  10/08/24 159 lb 9.6 oz (72.4 kg)    Physical Exam Vitals reviewed.  HENT:     Head: Normocephalic and atraumatic.  Eyes:     Pupils: Pupils are equal, round, and reactive to light.  Cardiovascular:     Rate and Rhythm: Normal rate and regular rhythm.     Heart sounds: Normal heart sounds.  Pulmonary:     Effort: Pulmonary effort is normal.     Breath sounds: Normal breath sounds.  Abdominal:     General: Bowel sounds are normal.     Palpations: Abdomen is soft.  Musculoskeletal:        General: No tenderness or deformity. Normal range of motion.     Cervical back: Normal range of motion.  Lymphadenopathy:     Cervical: No cervical adenopathy.  Skin:    General: Skin is warm and dry.     Findings: No erythema or rash.  Neurological:     Mental Status: She is alert and oriented to person, place, and time.  Psychiatric:        Behavior: Behavior normal.        Thought Content: Thought content normal.        Judgment: Judgment normal.      Lab Results  Component Value Date   WBC 1.7 (L) 10/24/2024   HGB 11.0 (L) 10/24/2024   HCT 30.8 (L) 10/24/2024   MCV 107.3 (H) 10/24/2024   PLT 195 10/24/2024     Chemistry      Component Value Date/Time   NA 139 09/19/2024  1234   K 3.8 09/19/2024 1234   CL 104 09/19/2024 1234   CO2 26 09/19/2024 1234   BUN 16 09/19/2024 1234   CREATININE 1.44 (H) 09/19/2024 1234      Component Value Date/Time   CALCIUM  8.6 (L) 09/19/2024 1234   ALKPHOS 65 09/19/2024  1234   AST 26 09/19/2024 1234   ALT 15 09/19/2024 1234   BILITOT 0.4 09/19/2024 1234      Impression and Plan: Ms. Geddis is a very nice 73 year old white female.  She has metastatic breast cancer.  We have her on antiestrogen therapy.  So far, I think she is done well with the antiestrogen therapy.   We will have to see what the PET scan shows.  Again the last 1 was done in August.  We will see what her CA 27.29 looks like.  Hopefully, this elevation was just an anomaly.  We will plan to get her back in a month.  I told her that we can always move her appointment after Thanksgiving.  She wants to get it done before Thanksgiving.  When we see her back, we will have to give her the Zometa ..   Maude JONELLE Crease, MD 10/29/202512:50 PM

## 2024-10-24 NOTE — Patient Instructions (Signed)
 PICC Home Care Guide A peripherally inserted central catheter (PICC) is a form of IV access that allows medicines and IV fluids to be quickly put into the blood and spread throughout the body. The PICC is a long, thin, flexible tube (catheter) that is put into a vein in a person's arm or leg. The catheter ends in a large vein just outside the heart called the superior vena cava (SVC). After the PICC is put in, a chest X-ray may be done to make sure that it is in the right place. A PICC may be placed for different reasons, such as: To give medicines and liquid nutrition. To give IV fluids and blood products. To take blood samples often. If there is trouble placing a peripheral intravenous (PIV) catheter. If cared for properly, a PICC can remain in place for many months. Having a PICC can allow you to go home from the hospital sooner and continue treatment at home. Medicines and PICC care can be managed at home by a family member, caregiver, or home health care team. What are the risks? Generally, having a PICC is safe. However, problems may occur, including: A blood clot (thrombus) forming in or at the end of the PICC. A blood clot forming in a vein (deep vein thrombosis) or traveling to the lung (pulmonary embolism). Inflammation of the vein (phlebitis) in which the PICC is placed. Infection at the insertion site or in the blood. Blood infections from central lines, like PICCs, can be serious and often require a hospital stay. PICC malposition, or PICC movement or poor placement. A break or cut in the PICC. Do not use scissors near the PICC. Nerve or tendon irritation or injury during PICC insertion. How to care for your PICC Please follow the specific guidelines provided by your health care provider. Preventing infection You and any caregivers should wash your hands often with soap and water for at least 20 seconds. Wash hands: Before touching the PICC or the infusion device. Before changing a  bandage (dressing). Do not change the dressing unless you have been taught to do so and have shown you are able to change it safely. Flush the PICC as told. Tell your health care provider right away if the PICC is hard to flush or does not flush. Do not use force to flush the PICC. Use clean and germ-free (sterile) supplies only. Keep the supplies in a dry place. Do not reuse needles, syringes, or any other supplies. Reusing supplies can lead to infection. Keep the PICC dressing dry and secure it with tape if the edges stop sticking to your skin. Check your PICC insertion site every day for signs of infection. Check for: Redness, swelling, or pain. Fluid or blood. Warmth. Pus or a bad smell. Preventing other problems Do not use a syringe that is less than 10 mL to flush the PICC. Do not have your blood pressure checked on the arm in which the PICC is placed. Do not ever pull or tug on the PICC. Keep it secured to your arm with tape or a stretch wrap when not in use. Do not take the PICC out yourself. Only a trained health care provider should remove the PICC. Keep pets and children away from your PICC. How to care for your PICC dressing Keep your PICC dressing clean and dry to prevent infection. Do not take baths, swim, or use a hot tub until your health care provider approves. Ask your health care provider if you can take  showers. You may only be allowed to take sponge baths. When you are allowed to shower: Ask your health care provider to teach you how to wrap the PICC. Cover the PICC with clear plastic wrap and tape to keep it dry while showering. Follow instructions from your health care provider about how to take care of your insertion site and dressing. Make sure you: Wash your hands with soap and water for at least 20 seconds before and after you change your dressing. If soap and water are not available, use hand sanitizer. Change your dressing only if taught to do so by your health care  provider. Your PICC dressing needs to be changed if it becomes loose or wet. Leave stitches (sutures), skin glue, or adhesive strips in place. These skin closures may need to stay in place for 2 weeks or longer. If adhesive strip edges start to loosen and curl up, you may trim the loose edges. Do not remove adhesive strips completely unless your health care provider tells you to do that. Follow these instructions at home: Disposal of supplies Throw away any syringes in a disposal container that is meant for sharp items (sharps container). You can buy a sharps container from a pharmacy, or you can make one by using an empty, hard plastic bottle with a lid. Place any used dressings or infusion bags into a plastic bag. Throw that bag in the trash. General instructions  Always carry your PICC identification card or wear a medical alert bracelet. Keep the tube clamped at all times, unless it is being used. Always carry a smooth-edge clamp with you to clamp the PICC if it breaks. Do not use scissors or sharp objects near the tube. You may bend your arm and move it freely. If your PICC is near or at the bend of your elbow, avoid activity with repeated motion at the elbow. Avoid lifting heavy objects as told by your health care provider. Keep all follow-up visits. This is important. You will need to have your PICC dressing changed at least once a week. Contact a health care provider if: You have pain in your arm, ear, face, or teeth. You have a fever or chills. You have redness, swelling, or pain around the insertion site. You have fluid or blood coming from the insertion site. Your insertion site feels warm to the touch. You have pus or a bad smell coming from the insertion site. Your skin feels hard and raised around the insertion site. Your PICC dressing has gotten wet or is coming off and you have not been taught how to change it. Get help right away if: You have problems with your PICC, such as  your PICC: Was tugged or pulled and has partially come out. Do not  push the PICC back in. Cannot be flushed, is hard to flush, or leaks around the insertion site when it is flushed. Makes a flushing sound when it is flushed. Appears to have a hole or tear. Is accidentally pulled all the way out. If this happens, cover the insertion site with a gauze dressing. Do not throw the PICC away. Your health care provider will need to check it to be sure the entire catheter came out. You feel your heart racing or skipping beats, or you have chest pain. You have shortness of breath or trouble breathing. You have swelling, redness, warmth, or pain in the arm in which the PICC is placed. You have a red streak going up your arm that  starts under the PICC dressing. These symptoms may be an emergency. Get help right away. Call 911. Do not wait to see if the symptoms will go away. Do not drive yourself to the hospital. Summary A peripherally inserted central catheter (PICC) is a long, thin, flexible tube (catheter) that is put into a vein in the arm or leg. If cared for properly, a PICC can remain in place for many months. Having a PICC can allow you to go home from the hospital sooner and continue treatment at home. The PICC is inserted using a germ-free (sterile) technique by a specially trained health care provider. Only a trained health care provider should remove it. Do not have your blood pressure checked on the arm in which your PICC is placed. Always keep your PICC identification card with you. This information is not intended to replace advice given to you by your health care provider. Make sure you discuss any questions you have with your health care provider. Document Revised: 07/01/2021 Document Reviewed: 07/01/2021 Elsevier Patient Education  2024 [employer].

## 2024-10-25 ENCOUNTER — Ambulatory Visit: Payer: Self-pay | Admitting: Hematology & Oncology

## 2024-10-25 LAB — CANCER ANTIGEN 27.29: CA 27.29: 34.1 U/mL (ref 0.0–38.6)

## 2024-10-31 ENCOUNTER — Inpatient Hospital Stay: Attending: Hematology & Oncology

## 2024-10-31 DIAGNOSIS — C50911 Malignant neoplasm of unspecified site of right female breast: Secondary | ICD-10-CM | POA: Diagnosis not present

## 2024-10-31 DIAGNOSIS — Z79899 Other long term (current) drug therapy: Secondary | ICD-10-CM | POA: Diagnosis not present

## 2024-10-31 DIAGNOSIS — C771 Secondary and unspecified malignant neoplasm of intrathoracic lymph nodes: Secondary | ICD-10-CM | POA: Diagnosis not present

## 2024-10-31 DIAGNOSIS — C7951 Secondary malignant neoplasm of bone: Secondary | ICD-10-CM | POA: Insufficient documentation

## 2024-10-31 DIAGNOSIS — Z170 Estrogen receptor positive status [ER+]: Secondary | ICD-10-CM | POA: Insufficient documentation

## 2024-10-31 NOTE — Patient Instructions (Signed)
 PICC Home Care Guide A peripherally inserted central catheter (PICC) is a form of IV access that allows medicines and IV fluids to be quickly put into the blood and spread throughout the body. The PICC is a long, thin, flexible tube (catheter) that is put into a vein in a person's arm or leg. The catheter ends in a large vein just outside the heart called the superior vena cava (SVC). After the PICC is put in, a chest X-ray may be done to make sure that it is in the right place. A PICC may be placed for different reasons, such as: To give medicines and liquid nutrition. To give IV fluids and blood products. To take blood samples often. If there is trouble placing a peripheral intravenous (PIV) catheter. If cared for properly, a PICC can remain in place for many months. Having a PICC can allow you to go home from the hospital sooner and continue treatment at home. Medicines and PICC care can be managed at home by a family member, caregiver, or home health care team. What are the risks? Generally, having a PICC is safe. However, problems may occur, including: A blood clot (thrombus) forming in or at the end of the PICC. A blood clot forming in a vein (deep vein thrombosis) or traveling to the lung (pulmonary embolism). Inflammation of the vein (phlebitis) in which the PICC is placed. Infection at the insertion site or in the blood. Blood infections from central lines, like PICCs, can be serious and often require a hospital stay. PICC malposition, or PICC movement or poor placement. A break or cut in the PICC. Do not use scissors near the PICC. Nerve or tendon irritation or injury during PICC insertion. How to care for your PICC Please follow the specific guidelines provided by your health care provider. Preventing infection You and any caregivers should wash your hands often with soap and water for at least 20 seconds. Wash hands: Before touching the PICC or the infusion device. Before changing a  bandage (dressing). Do not change the dressing unless you have been taught to do so and have shown you are able to change it safely. Flush the PICC as told. Tell your health care provider right away if the PICC is hard to flush or does not flush. Do not use force to flush the PICC. Use clean and germ-free (sterile) supplies only. Keep the supplies in a dry place. Do not reuse needles, syringes, or any other supplies. Reusing supplies can lead to infection. Keep the PICC dressing dry and secure it with tape if the edges stop sticking to your skin. Check your PICC insertion site every day for signs of infection. Check for: Redness, swelling, or pain. Fluid or blood. Warmth. Pus or a bad smell. Preventing other problems Do not use a syringe that is less than 10 mL to flush the PICC. Do not have your blood pressure checked on the arm in which the PICC is placed. Do not ever pull or tug on the PICC. Keep it secured to your arm with tape or a stretch wrap when not in use. Do not take the PICC out yourself. Only a trained health care provider should remove the PICC. Keep pets and children away from your PICC. How to care for your PICC dressing Keep your PICC dressing clean and dry to prevent infection. Do not take baths, swim, or use a hot tub until your health care provider approves. Ask your health care provider if you can take  showers. You may only be allowed to take sponge baths. When you are allowed to shower: Ask your health care provider to teach you how to wrap the PICC. Cover the PICC with clear plastic wrap and tape to keep it dry while showering. Follow instructions from your health care provider about how to take care of your insertion site and dressing. Make sure you: Wash your hands with soap and water for at least 20 seconds before and after you change your dressing. If soap and water are not available, use hand sanitizer. Change your dressing only if taught to do so by your health care  provider. Your PICC dressing needs to be changed if it becomes loose or wet. Leave stitches (sutures), skin glue, or adhesive strips in place. These skin closures may need to stay in place for 2 weeks or longer. If adhesive strip edges start to loosen and curl up, you may trim the loose edges. Do not remove adhesive strips completely unless your health care provider tells you to do that. Follow these instructions at home: Disposal of supplies Throw away any syringes in a disposal container that is meant for sharp items (sharps container). You can buy a sharps container from a pharmacy, or you can make one by using an empty, hard plastic bottle with a lid. Place any used dressings or infusion bags into a plastic bag. Throw that bag in the trash. General instructions  Always carry your PICC identification card or wear a medical alert bracelet. Keep the tube clamped at all times, unless it is being used. Always carry a smooth-edge clamp with you to clamp the PICC if it breaks. Do not use scissors or sharp objects near the tube. You may bend your arm and move it freely. If your PICC is near or at the bend of your elbow, avoid activity with repeated motion at the elbow. Avoid lifting heavy objects as told by your health care provider. Keep all follow-up visits. This is important. You will need to have your PICC dressing changed at least once a week. Contact a health care provider if: You have pain in your arm, ear, face, or teeth. You have a fever or chills. You have redness, swelling, or pain around the insertion site. You have fluid or blood coming from the insertion site. Your insertion site feels warm to the touch. You have pus or a bad smell coming from the insertion site. Your skin feels hard and raised around the insertion site. Your PICC dressing has gotten wet or is coming off and you have not been taught how to change it. Get help right away if: You have problems with your PICC, such as  your PICC: Was tugged or pulled and has partially come out. Do not  push the PICC back in. Cannot be flushed, is hard to flush, or leaks around the insertion site when it is flushed. Makes a flushing sound when it is flushed. Appears to have a hole or tear. Is accidentally pulled all the way out. If this happens, cover the insertion site with a gauze dressing. Do not throw the PICC away. Your health care provider will need to check it to be sure the entire catheter came out. You feel your heart racing or skipping beats, or you have chest pain. You have shortness of breath or trouble breathing. You have swelling, redness, warmth, or pain in the arm in which the PICC is placed. You have a red streak going up your arm that  starts under the PICC dressing. These symptoms may be an emergency. Get help right away. Call 911. Do not wait to see if the symptoms will go away. Do not drive yourself to the hospital. Summary A peripherally inserted central catheter (PICC) is a long, thin, flexible tube (catheter) that is put into a vein in the arm or leg. If cared for properly, a PICC can remain in place for many months. Having a PICC can allow you to go home from the hospital sooner and continue treatment at home. The PICC is inserted using a germ-free (sterile) technique by a specially trained health care provider. Only a trained health care provider should remove it. Do not have your blood pressure checked on the arm in which your PICC is placed. Always keep your PICC identification card with you. This information is not intended to replace advice given to you by your health care provider. Make sure you discuss any questions you have with your health care provider. Document Revised: 07/01/2021 Document Reviewed: 07/01/2021 Elsevier Patient Education  2024 [employer].

## 2024-11-05 ENCOUNTER — Other Ambulatory Visit (HOSPITAL_BASED_OUTPATIENT_CLINIC_OR_DEPARTMENT_OTHER): Payer: Self-pay

## 2024-11-05 ENCOUNTER — Other Ambulatory Visit: Payer: Self-pay

## 2024-11-07 ENCOUNTER — Other Ambulatory Visit (HOSPITAL_BASED_OUTPATIENT_CLINIC_OR_DEPARTMENT_OTHER): Payer: Self-pay

## 2024-11-07 ENCOUNTER — Inpatient Hospital Stay

## 2024-11-07 DIAGNOSIS — C771 Secondary and unspecified malignant neoplasm of intrathoracic lymph nodes: Secondary | ICD-10-CM | POA: Diagnosis not present

## 2024-11-07 NOTE — Patient Instructions (Signed)
 PICC Home Care Guide A peripherally inserted central catheter (PICC) is a form of IV access that allows medicines and IV fluids to be quickly put into the blood and spread throughout the body. The PICC is a long, thin, flexible tube (catheter) that is put into a vein in a person's arm or leg. The catheter ends in a large vein just outside the heart called the superior vena cava (SVC). After the PICC is put in, a chest X-ray may be done to make sure that it is in the right place. A PICC may be placed for different reasons, such as: To give medicines and liquid nutrition. To give IV fluids and blood products. To take blood samples often. If there is trouble placing a peripheral intravenous (PIV) catheter. If cared for properly, a PICC can remain in place for many months. Having a PICC can allow you to go home from the hospital sooner and continue treatment at home. Medicines and PICC care can be managed at home by a family member, caregiver, or home health care team. What are the risks? Generally, having a PICC is safe. However, problems may occur, including: A blood clot (thrombus) forming in or at the end of the PICC. A blood clot forming in a vein (deep vein thrombosis) or traveling to the lung (pulmonary embolism). Inflammation of the vein (phlebitis) in which the PICC is placed. Infection at the insertion site or in the blood. Blood infections from central lines, like PICCs, can be serious and often require a hospital stay. PICC malposition, or PICC movement or poor placement. A break or cut in the PICC. Do not use scissors near the PICC. Nerve or tendon irritation or injury during PICC insertion. How to care for your PICC Please follow the specific guidelines provided by your health care provider. Preventing infection You and any caregivers should wash your hands often with soap and water for at least 20 seconds. Wash hands: Before touching the PICC or the infusion device. Before changing a  bandage (dressing). Do not change the dressing unless you have been taught to do so and have shown you are able to change it safely. Flush the PICC as told. Tell your health care provider right away if the PICC is hard to flush or does not flush. Do not use force to flush the PICC. Use clean and germ-free (sterile) supplies only. Keep the supplies in a dry place. Do not reuse needles, syringes, or any other supplies. Reusing supplies can lead to infection. Keep the PICC dressing dry and secure it with tape if the edges stop sticking to your skin. Check your PICC insertion site every day for signs of infection. Check for: Redness, swelling, or pain. Fluid or blood. Warmth. Pus or a bad smell. Preventing other problems Do not use a syringe that is less than 10 mL to flush the PICC. Do not have your blood pressure checked on the arm in which the PICC is placed. Do not ever pull or tug on the PICC. Keep it secured to your arm with tape or a stretch wrap when not in use. Do not take the PICC out yourself. Only a trained health care provider should remove the PICC. Keep pets and children away from your PICC. How to care for your PICC dressing Keep your PICC dressing clean and dry to prevent infection. Do not take baths, swim, or use a hot tub until your health care provider approves. Ask your health care provider if you can take  showers. You may only be allowed to take sponge baths. When you are allowed to shower: Ask your health care provider to teach you how to wrap the PICC. Cover the PICC with clear plastic wrap and tape to keep it dry while showering. Follow instructions from your health care provider about how to take care of your insertion site and dressing. Make sure you: Wash your hands with soap and water for at least 20 seconds before and after you change your dressing. If soap and water are not available, use hand sanitizer. Change your dressing only if taught to do so by your health care  provider. Your PICC dressing needs to be changed if it becomes loose or wet. Leave stitches (sutures), skin glue, or adhesive strips in place. These skin closures may need to stay in place for 2 weeks or longer. If adhesive strip edges start to loosen and curl up, you may trim the loose edges. Do not remove adhesive strips completely unless your health care provider tells you to do that. Follow these instructions at home: Disposal of supplies Throw away any syringes in a disposal container that is meant for sharp items (sharps container). You can buy a sharps container from a pharmacy, or you can make one by using an empty, hard plastic bottle with a lid. Place any used dressings or infusion bags into a plastic bag. Throw that bag in the trash. General instructions  Always carry your PICC identification card or wear a medical alert bracelet. Keep the tube clamped at all times, unless it is being used. Always carry a smooth-edge clamp with you to clamp the PICC if it breaks. Do not use scissors or sharp objects near the tube. You may bend your arm and move it freely. If your PICC is near or at the bend of your elbow, avoid activity with repeated motion at the elbow. Avoid lifting heavy objects as told by your health care provider. Keep all follow-up visits. This is important. You will need to have your PICC dressing changed at least once a week. Contact a health care provider if: You have pain in your arm, ear, face, or teeth. You have a fever or chills. You have redness, swelling, or pain around the insertion site. You have fluid or blood coming from the insertion site. Your insertion site feels warm to the touch. You have pus or a bad smell coming from the insertion site. Your skin feels hard and raised around the insertion site. Your PICC dressing has gotten wet or is coming off and you have not been taught how to change it. Get help right away if: You have problems with your PICC, such as  your PICC: Was tugged or pulled and has partially come out. Do not  push the PICC back in. Cannot be flushed, is hard to flush, or leaks around the insertion site when it is flushed. Makes a flushing sound when it is flushed. Appears to have a hole or tear. Is accidentally pulled all the way out. If this happens, cover the insertion site with a gauze dressing. Do not throw the PICC away. Your health care provider will need to check it to be sure the entire catheter came out. You feel your heart racing or skipping beats, or you have chest pain. You have shortness of breath or trouble breathing. You have swelling, redness, warmth, or pain in the arm in which the PICC is placed. You have a red streak going up your arm that  starts under the PICC dressing. These symptoms may be an emergency. Get help right away. Call 911. Do not wait to see if the symptoms will go away. Do not drive yourself to the hospital. Summary A peripherally inserted central catheter (PICC) is a long, thin, flexible tube (catheter) that is put into a vein in the arm or leg. If cared for properly, a PICC can remain in place for many months. Having a PICC can allow you to go home from the hospital sooner and continue treatment at home. The PICC is inserted using a germ-free (sterile) technique by a specially trained health care provider. Only a trained health care provider should remove it. Do not have your blood pressure checked on the arm in which your PICC is placed. Always keep your PICC identification card with you. This information is not intended to replace advice given to you by your health care provider. Make sure you discuss any questions you have with your health care provider. Document Revised: 07/01/2021 Document Reviewed: 07/01/2021 Elsevier Patient Education  2024 [employer].

## 2024-11-12 ENCOUNTER — Other Ambulatory Visit: Payer: Self-pay

## 2024-11-12 ENCOUNTER — Encounter (HOSPITAL_COMMUNITY)
Admission: RE | Admit: 2024-11-12 | Discharge: 2024-11-12 | Disposition: A | Discharge status: Home / Self Care | Source: Ambulatory Visit | Attending: Hematology & Oncology | Admitting: Hematology & Oncology

## 2024-11-12 ENCOUNTER — Other Ambulatory Visit (HOSPITAL_COMMUNITY): Payer: Self-pay

## 2024-11-12 DIAGNOSIS — C7951 Secondary malignant neoplasm of bone: Secondary | ICD-10-CM | POA: Diagnosis not present

## 2024-11-12 DIAGNOSIS — C50919 Malignant neoplasm of unspecified site of unspecified female breast: Secondary | ICD-10-CM | POA: Diagnosis not present

## 2024-11-12 LAB — GLUCOSE, CAPILLARY: Glucose-Capillary: 100 mg/dL — ABNORMAL HIGH (ref 70–99)

## 2024-11-12 MED ORDER — FLUDEOXYGLUCOSE F - 18 (FDG) INJECTION
7.5000 | Freq: Once | INTRAVENOUS | Status: AC
  Administered 2024-11-12: 7.74 via INTRAVENOUS

## 2024-11-12 NOTE — Progress Notes (Signed)
 Specialty Pharmacy Refill Coordination Note  Tracy Thompson is a 73 y.o. female contacted today regarding refills of specialty medication(s) Ribociclib  Succinate (KISQALI  400mg  Daily Dose)   Patient requested Delivery   Delivery date: 11/21/24   Verified address: [address] [place] [id number]   Medication will be filled on: 11/20/24

## 2024-11-13 ENCOUNTER — Other Ambulatory Visit: Payer: Self-pay | Admitting: Family Medicine

## 2024-11-13 DIAGNOSIS — I10 Essential (primary) hypertension: Secondary | ICD-10-CM

## 2024-11-14 ENCOUNTER — Inpatient Hospital Stay

## 2024-11-14 ENCOUNTER — Ambulatory Visit: Payer: Self-pay | Admitting: Hematology & Oncology

## 2024-11-14 NOTE — Patient Instructions (Signed)
 PICC Home Care Guide A peripherally inserted central catheter (PICC) is a form of IV access that allows medicines and IV fluids to be quickly put into the blood and spread throughout the body. The PICC is a long, thin, flexible tube (catheter) that is put into a vein in a person's arm or leg. The catheter ends in a large vein just outside the heart called the superior vena cava (SVC). After the PICC is put in, a chest X-ray may be done to make sure that it is in the right place. A PICC may be placed for different reasons, such as: To give medicines and liquid nutrition. To give IV fluids and blood products. To take blood samples often. If there is trouble placing a peripheral intravenous (PIV) catheter. If cared for properly, a PICC can remain in place for many months. Having a PICC can allow you to go home from the hospital sooner and continue treatment at home. Medicines and PICC care can be managed at home by a family member, caregiver, or home health care team. What are the risks? Generally, having a PICC is safe. However, problems may occur, including: A blood clot (thrombus) forming in or at the end of the PICC. A blood clot forming in a vein (deep vein thrombosis) or traveling to the lung (pulmonary embolism). Inflammation of the vein (phlebitis) in which the PICC is placed. Infection at the insertion site or in the blood. Blood infections from central lines, like PICCs, can be serious and often require a hospital stay. PICC malposition, or PICC movement or poor placement. A break or cut in the PICC. Do not use scissors near the PICC. Nerve or tendon irritation or injury during PICC insertion. How to care for your PICC Please follow the specific guidelines provided by your health care provider. Preventing infection You and any caregivers should wash your hands often with soap and water for at least 20 seconds. Wash hands: Before touching the PICC or the infusion device. Before changing a  bandage (dressing). Do not change the dressing unless you have been taught to do so and have shown you are able to change it safely. Flush the PICC as told. Tell your health care provider right away if the PICC is hard to flush or does not flush. Do not use force to flush the PICC. Use clean and germ-free (sterile) supplies only. Keep the supplies in a dry place. Do not reuse needles, syringes, or any other supplies. Reusing supplies can lead to infection. Keep the PICC dressing dry and secure it with tape if the edges stop sticking to your skin. Check your PICC insertion site every day for signs of infection. Check for: Redness, swelling, or pain. Fluid or blood. Warmth. Pus or a bad smell. Preventing other problems Do not use a syringe that is less than 10 mL to flush the PICC. Do not have your blood pressure checked on the arm in which the PICC is placed. Do not ever pull or tug on the PICC. Keep it secured to your arm with tape or a stretch wrap when not in use. Do not take the PICC out yourself. Only a trained health care provider should remove the PICC. Keep pets and children away from your PICC. How to care for your PICC dressing Keep your PICC dressing clean and dry to prevent infection. Do not take baths, swim, or use a hot tub until your health care provider approves. Ask your health care provider if you can take  showers. You may only be allowed to take sponge baths. When you are allowed to shower: Ask your health care provider to teach you how to wrap the PICC. Cover the PICC with clear plastic wrap and tape to keep it dry while showering. Follow instructions from your health care provider about how to take care of your insertion site and dressing. Make sure you: Wash your hands with soap and water for at least 20 seconds before and after you change your dressing. If soap and water are not available, use hand sanitizer. Change your dressing only if taught to do so by your health care  provider. Your PICC dressing needs to be changed if it becomes loose or wet. Leave stitches (sutures), skin glue, or adhesive strips in place. These skin closures may need to stay in place for 2 weeks or longer. If adhesive strip edges start to loosen and curl up, you may trim the loose edges. Do not remove adhesive strips completely unless your health care provider tells you to do that. Follow these instructions at home: Disposal of supplies Throw away any syringes in a disposal container that is meant for sharp items (sharps container). You can buy a sharps container from a pharmacy, or you can make one by using an empty, hard plastic bottle with a lid. Place any used dressings or infusion bags into a plastic bag. Throw that bag in the trash. General instructions  Always carry your PICC identification card or wear a medical alert bracelet. Keep the tube clamped at all times, unless it is being used. Always carry a smooth-edge clamp with you to clamp the PICC if it breaks. Do not use scissors or sharp objects near the tube. You may bend your arm and move it freely. If your PICC is near or at the bend of your elbow, avoid activity with repeated motion at the elbow. Avoid lifting heavy objects as told by your health care provider. Keep all follow-up visits. This is important. You will need to have your PICC dressing changed at least once a week. Contact a health care provider if: You have pain in your arm, ear, face, or teeth. You have a fever or chills. You have redness, swelling, or pain around the insertion site. You have fluid or blood coming from the insertion site. Your insertion site feels warm to the touch. You have pus or a bad smell coming from the insertion site. Your skin feels hard and raised around the insertion site. Your PICC dressing has gotten wet or is coming off and you have not been taught how to change it. Get help right away if: You have problems with your PICC, such as  your PICC: Was tugged or pulled and has partially come out. Do not  push the PICC back in. Cannot be flushed, is hard to flush, or leaks around the insertion site when it is flushed. Makes a flushing sound when it is flushed. Appears to have a hole or tear. Is accidentally pulled all the way out. If this happens, cover the insertion site with a gauze dressing. Do not throw the PICC away. Your health care provider will need to check it to be sure the entire catheter came out. You feel your heart racing or skipping beats, or you have chest pain. You have shortness of breath or trouble breathing. You have swelling, redness, warmth, or pain in the arm in which the PICC is placed. You have a red streak going up your arm that  starts under the PICC dressing. These symptoms may be an emergency. Get help right away. Call 911. Do not wait to see if the symptoms will go away. Do not drive yourself to the hospital. Summary A peripherally inserted central catheter (PICC) is a long, thin, flexible tube (catheter) that is put into a vein in the arm or leg. If cared for properly, a PICC can remain in place for many months. Having a PICC can allow you to go home from the hospital sooner and continue treatment at home. The PICC is inserted using a germ-free (sterile) technique by a specially trained health care provider. Only a trained health care provider should remove it. Do not have your blood pressure checked on the arm in which your PICC is placed. Always keep your PICC identification card with you. This information is not intended to replace advice given to you by your health care provider. Make sure you discuss any questions you have with your health care provider. Document Revised: 07/01/2021 Document Reviewed: 07/01/2021 Elsevier Patient Education  2024 [employer].

## 2024-11-21 ENCOUNTER — Encounter: Payer: Self-pay | Admitting: Hematology & Oncology

## 2024-11-21 ENCOUNTER — Inpatient Hospital Stay

## 2024-11-21 ENCOUNTER — Other Ambulatory Visit: Payer: Self-pay | Admitting: Family Medicine

## 2024-11-21 ENCOUNTER — Inpatient Hospital Stay: Admitting: Hematology & Oncology

## 2024-11-21 VITALS — BP 102/72 | HR 76 | Resp 18

## 2024-11-21 VITALS — BP 121/77 | HR 86 | Temp 98.1°F | Resp 16 | Ht 66.0 in | Wt 156.0 lb

## 2024-11-21 DIAGNOSIS — Z79899 Other long term (current) drug therapy: Secondary | ICD-10-CM | POA: Diagnosis not present

## 2024-11-21 DIAGNOSIS — C50919 Malignant neoplasm of unspecified site of unspecified female breast: Secondary | ICD-10-CM

## 2024-11-21 DIAGNOSIS — C7951 Secondary malignant neoplasm of bone: Secondary | ICD-10-CM | POA: Diagnosis not present

## 2024-11-21 DIAGNOSIS — Z170 Estrogen receptor positive status [ER+]: Secondary | ICD-10-CM | POA: Diagnosis not present

## 2024-11-21 DIAGNOSIS — C771 Secondary and unspecified malignant neoplasm of intrathoracic lymph nodes: Secondary | ICD-10-CM | POA: Diagnosis not present

## 2024-11-21 DIAGNOSIS — C50911 Malignant neoplasm of unspecified site of right female breast: Secondary | ICD-10-CM | POA: Diagnosis not present

## 2024-11-21 DIAGNOSIS — I10 Essential (primary) hypertension: Secondary | ICD-10-CM

## 2024-11-21 LAB — CMP (CANCER CENTER ONLY)
ALT: 19 U/L (ref 0–44)
AST: 29 U/L (ref 15–41)
Albumin: 3.8 g/dL (ref 3.5–5.0)
Alkaline Phosphatase: 66 U/L (ref 38–126)
Anion gap: 11 (ref 5–15)
BUN: 20 mg/dL (ref 8–23)
CO2: 28 mmol/L (ref 22–32)
Calcium: 9 mg/dL (ref 8.9–10.3)
Chloride: 100 mmol/L (ref 98–111)
Creatinine: 1.55 mg/dL — ABNORMAL HIGH (ref 0.44–1.00)
GFR, Estimated: 35 mL/min — ABNORMAL LOW (ref >60)
Glucose, Bld: 101 mg/dL — ABNORMAL HIGH (ref 70–99)
Potassium: 3.3 mmol/L — ABNORMAL LOW (ref 3.5–5.1)
Sodium: 139 mmol/L (ref 135–145)
Total Bilirubin: 0.4 mg/dL (ref 0.0–1.2)
Total Protein: 6.7 g/dL (ref 6.5–8.1)

## 2024-11-21 LAB — CBC WITH DIFFERENTIAL (CANCER CENTER ONLY)
Abs Immature Granulocytes: 0 K/uL (ref 0.00–0.07)
Basophils Absolute: 0 K/uL (ref 0.0–0.1)
Basophils Relative: 2 %
Eosinophils Absolute: 0 K/uL (ref 0.0–0.5)
Eosinophils Relative: 1 %
HCT: 29.4 % — ABNORMAL LOW (ref 36.0–46.0)
Hemoglobin: 10.4 g/dL — ABNORMAL LOW (ref 12.0–15.0)
Immature Granulocytes: 0 %
Lymphocytes Relative: 23 %
Lymphs Abs: 0.4 K/uL — ABNORMAL LOW (ref 0.7–4.0)
MCH: 38 pg — ABNORMAL HIGH (ref 26.0–34.0)
MCHC: 35.4 g/dL (ref 30.0–36.0)
MCV: 107.3 fL — ABNORMAL HIGH (ref 80.0–100.0)
Monocytes Absolute: 0.2 K/uL (ref 0.1–1.0)
Monocytes Relative: 14 %
Neutro Abs: 1 K/uL — ABNORMAL LOW (ref 1.7–7.7)
Neutrophils Relative %: 60 %
Platelet Count: 170 K/uL (ref 150–400)
RBC: 2.74 MIL/uL — ABNORMAL LOW (ref 3.87–5.11)
RDW: 13.1 % (ref 11.5–15.5)
WBC Count: 1.7 K/uL — ABNORMAL LOW (ref 4.0–10.5)
nRBC: 0 % (ref 0.0–0.2)

## 2024-11-21 LAB — LACTATE DEHYDROGENASE: LDH: 188 U/L (ref 105–235)

## 2024-11-21 MED ORDER — ZOLEDRONIC ACID 4 MG/5ML IV CONC
3.0000 mg | Freq: Once | INTRAVENOUS | Status: AC
  Administered 2024-11-21: 3 mg via INTRAVENOUS
  Filled 2024-11-21: qty 3.75

## 2024-11-21 MED ORDER — SODIUM CHLORIDE 0.9 % IV SOLN: INTRAVENOUS | Status: DC

## 2024-11-21 MED ORDER — FULVESTRANT 250 MG/5ML IM SOSY: 500.0000 mg | PREFILLED_SYRINGE | Freq: Once | INTRAMUSCULAR | Status: DC

## 2024-11-21 MED ORDER — FULVESTRANT 250 MG/5ML IM SOSY
500.0000 mg | PREFILLED_SYRINGE | Freq: Once | INTRAMUSCULAR | Status: AC
  Administered 2024-11-21: 500 mg via INTRAMUSCULAR
  Filled 2024-11-21: qty 10

## 2024-11-21 NOTE — Progress Notes (Signed)
 Hematology and Oncology Follow Up Visit  Tracy Thompson [id number] 24-May-1951 73 y.o. 11/21/2024   Principle Diagnosis:  Metastatic breast cancer-bone metastasis/hilar lymph node metastasis- ER+/PR+/HER-2 (2+) -- PIK3CA (+)  Current Therapy:   Faslodex  500 mg IM monthly -start on 11/09/2022 Ribociclib  600 mg p.o. daily (21/7) --start on 11/09/2022 --changed to 400 mg p.o. daily started on 09/05/2023 Zometa  3 mg IV every 3 months - next dose 01/2025  Radiation therapy to left hip -completed in 11/23/2022     Interim History:  Tracy Thompson is back for follow-up.  Tracy Thompson and her husband are getting ready to go to [place]  for Thanksgiving tomorrow.  I am sure that they will have a wonderful time..  Tracy Thompson had a PET scan that was done.  The PET scan was done on 11/12/2024.  The PET scan did not show any metabolically active disease.  Her last CA 27.29 was down to 34.  This is sort of where it has been.  Tracy Thompson feels well.  Tracy Thompson really has had no complaints.  Tracy Thompson has had no problems with nausea or vomiting.  Tracy Thompson has had no cough or shortness of breath.  Tracy Thompson has had no change in bowel or bladder habits.  There is been a little bit of leg swelling, more so on the left leg than right leg.  This is more chronic.  Tracy Thompson has had no headache.  There has been no problems with bleeding.  Overall, her performance status is ECOG 1.     Wt Readings from Last 3 Encounters:  11/21/24 156 lb (70.8 kg)  10/24/24 155 lb 0.6 oz (70.3 kg)  10/22/24 159 lb (72.1 kg)    Medications:  Current Outpatient Medications:    aspirin 81 MG tablet, Take 81 mg by mouth daily., Disp: , Rfl:    bisacodyl (DULCOLAX) 5 MG EC tablet, Take 5 mg by mouth daily as needed for moderate constipation., Disp: , Rfl:    bisoprolol -hydrochlorothiazide  (ZIAC ) 10-6.25 MG tablet, Take 1 tablet by mouth daily., Disp: 90 tablet, Rfl: 3   carboxymethylcellulose (REFRESH PLUS) 0.5 % SOLN, Place 1 drop into both eyes 3 (three) times daily as  needed., Disp: , Rfl:    Cholecalciferol (VITAMIN D ) 50 MCG (2000 UT) CAPS, Take 2,000 Units by mouth daily., Disp: , Rfl:    Cyanocobalamin  (VITAMIN B 12) 500 MCG TABS, Take 1,000 mcg by mouth daily., Disp: , Rfl:    fulvestrant  (FASLODEX ) 250 MG/5ML injection, Inject 500 mg into the muscle every 30 (thirty) days. One injection each buttock over 1-2 minutes. Warm prior to use., Disp: , Rfl:    Heparin  Na, Pork, Lock Flsh PF 100 UNIT/ML SOLN, Inject 2.5 mLs (250 Units total) into the vein every 12 (twelve) hours., Disp: 300 mL, Rfl: 3   hydrochlorothiazide  (HYDRODIURIL ) 25 MG tablet, Take 1 tablet (25 mg total) by mouth daily., Disp: 90 tablet, Rfl: 3   meclizine  (ANTIVERT ) 12.5 MG tablet, Take 1 tablet (12.5 mg total) by mouth 3 (three) times daily as needed for dizziness., Disp: 30 tablet, Rfl: 4   NONFORMULARY OR COMPOUNDED ITEM, Cbcd, cmp, tsh, lipid ---  dx htn,, hyperlipidemia, Disp: 1 each, Rfl: 0   OLANZapine  (ZYPREXA ) 5 MG tablet, TAKE 1 TABLET(5 MG) BY MOUTH AT BEDTIME, Disp: 30 tablet, Rfl: 3   ondansetron  (ZOFRAN ) 8 MG tablet, TAKE 1 TABLET(8 MG) BY MOUTH EVERY 8 HOURS AS NEEDED FOR NAUSEA OR VOMITING, Disp: 30 tablet, Rfl: 2   potassium chloride  SA (KLOR-CON  M)  20 MEQ tablet, Take 1 tablet (20 mEq total) by mouth 2 (two) times daily., Disp: 180 tablet, Rfl: 1   ribociclib  succ (KISQALI  400MG  DAILY DOSE) 200 MG Therapy Pack, Take 2 tablets (400 mg total) by mouth daily. Take for 21 days on, 7 days off, repeat every 28 days., Disp: 63 tablet, Rfl: 6   sodium chloride  flush 0.9 % SOLN injection, Inject 10 mLs into the vein every 12 (twelve) hours., Disp: 600 mL, Rfl: 3   traMADol  (ULTRAM ) 50 MG tablet, Take 1 tablet (50 mg total) by mouth every 6 (six) hours as needed., Disp: 90 tablet, Rfl: 0   zoledronic  acid (ZOMETA ) 4 MG/5ML injection, Inject 4 mg into the vein every 3 (three) months., Disp: , Rfl:  No current facility-administered medications for this visit.  Facility-Administered  Medications Ordered in Other Visits:    0.9 %  sodium chloride  infusion, , Intravenous, Continuous, Smayan Hackbart, Maude SAUNDERS, MD   fulvestrant  (FASLODEX ) injection 500 mg, 500 mg, Intramuscular, Once, Covington, Sarah M, PA-C   Zoledronic  Acid (ZOMETA ) IVPB 4 mg, 4 mg, Intravenous, Once, Covington, Sarah M, PA-C  Allergies: No Known Allergies  Past Medical History, Surgical history, Social history, and Family History were reviewed and updated.  Review of Systems: Review of Systems  Constitutional:  Positive for fatigue.  HENT:  Negative.    Eyes: Negative.   Respiratory: Negative.    Cardiovascular: Negative.   Gastrointestinal:  Positive for constipation and nausea.  Endocrine: Negative.   Genitourinary: Negative.    Musculoskeletal:  Positive for arthralgias and myalgias.  Skin: Negative.   Neurological: Negative.   Hematological: Negative.   Psychiatric/Behavioral: Negative.      Physical Exam: Vitals:   11/21/24 1112  BP: 121/77  Pulse: 86  Resp: 16  Temp: 98.1 F (36.7 C)  SpO2: 97%   Wt Readings from Last 3 Encounters:  11/21/24 156 lb (70.8 kg)  10/24/24 155 lb 0.6 oz (70.3 kg)  10/22/24 159 lb (72.1 kg)    Physical Exam Vitals reviewed.  HENT:     Head: Normocephalic and atraumatic.  Eyes:     Pupils: Pupils are equal, round, and reactive to light.  Cardiovascular:     Rate and Rhythm: Normal rate and regular rhythm.     Heart sounds: Normal heart sounds.  Pulmonary:     Effort: Pulmonary effort is normal.     Breath sounds: Normal breath sounds.  Abdominal:     General: Bowel sounds are normal.     Palpations: Abdomen is soft.  Musculoskeletal:        General: No tenderness or deformity. Normal range of motion.     Cervical back: Normal range of motion.  Lymphadenopathy:     Cervical: No cervical adenopathy.  Skin:    General: Skin is warm and dry.     Findings: No erythema or rash.  Neurological:     Mental Status: Tracy Thompson is alert and oriented to  person, place, and time.  Psychiatric:        Behavior: Behavior normal.        Thought Content: Thought content normal.        Judgment: Judgment normal.      Lab Results  Component Value Date   WBC 1.7 (L) 11/21/2024   HGB 10.4 (L) 11/21/2024   HCT 29.4 (L) 11/21/2024   MCV 107.3 (H) 11/21/2024   PLT 170 11/21/2024     Chemistry      Component Value Date/Time  NA 139 11/21/2024 1040   K 3.3 (L) 11/21/2024 1040   CL 100 11/21/2024 1040   CO2 28 11/21/2024 1040   BUN 20 11/21/2024 1040   CREATININE 1.55 (H) 11/21/2024 1040      Component Value Date/Time   CALCIUM  9.0 11/21/2024 1040   ALKPHOS 66 11/21/2024 1040   AST 29 11/21/2024 1040   ALT 19 11/21/2024 1040   BILITOT 0.4 11/21/2024 1040      Impression and Plan: Tracy Thompson is a very nice 73 year old white female.  Tracy Thompson has metastatic breast cancer.  We have her on antiestrogen therapy.  So far, I think Tracy Thompson is done well with the antiestrogen therapy.   I am happy that the PET scan shows that everything is still stable.  There is nothing that is metabolically active.  As such, I think that our protocol is working quite nicely.  We will go ahead with her Faslodex  and Zometa .  I will decrease the Zometa  dose down to 3 mg given her renal function.  I am happy that her quality of life is doing so well.  Tracy Thompson really is doing nicely.  We will plan to get her back in another month.   Maude JONELLE Crease, MD 11/26/202511:54 AM

## 2024-11-21 NOTE — Patient Instructions (Signed)

## 2024-11-22 LAB — CANCER ANTIGEN 27.29: CA 27.29: 37 U/mL (ref 0.0–38.6)

## 2024-11-28 ENCOUNTER — Inpatient Hospital Stay: Attending: Hematology & Oncology

## 2024-11-28 DIAGNOSIS — Z170 Estrogen receptor positive status [ER+]: Secondary | ICD-10-CM | POA: Insufficient documentation

## 2024-11-28 DIAGNOSIS — C7951 Secondary malignant neoplasm of bone: Secondary | ICD-10-CM | POA: Insufficient documentation

## 2024-11-28 DIAGNOSIS — Z1732 Human epidermal growth factor receptor 2 negative status: Secondary | ICD-10-CM | POA: Insufficient documentation

## 2024-11-28 DIAGNOSIS — Z79899 Other long term (current) drug therapy: Secondary | ICD-10-CM | POA: Insufficient documentation

## 2024-11-28 DIAGNOSIS — Z452 Encounter for adjustment and management of vascular access device: Secondary | ICD-10-CM | POA: Insufficient documentation

## 2024-11-28 DIAGNOSIS — Z5111 Encounter for antineoplastic chemotherapy: Secondary | ICD-10-CM | POA: Insufficient documentation

## 2024-11-28 DIAGNOSIS — C771 Secondary and unspecified malignant neoplasm of intrathoracic lymph nodes: Secondary | ICD-10-CM | POA: Insufficient documentation

## 2024-11-28 DIAGNOSIS — C50919 Malignant neoplasm of unspecified site of unspecified female breast: Secondary | ICD-10-CM | POA: Insufficient documentation

## 2024-11-28 DIAGNOSIS — Z1721 Progesterone receptor positive status: Secondary | ICD-10-CM | POA: Insufficient documentation

## 2024-11-28 NOTE — Patient Instructions (Signed)
 PICC Home Care Guide A peripherally inserted central catheter (PICC) is a form of IV access that allows medicines and IV fluids to be quickly put into the blood and spread throughout the body. The PICC is a long, thin, flexible tube (catheter) that is put into a vein in a person's arm or leg. The catheter ends in a large vein just outside the heart called the superior vena cava (SVC). After the PICC is put in, a chest X-ray may be done to make sure that it is in the right place. A PICC may be placed for different reasons, such as: To give medicines and liquid nutrition. To give IV fluids and blood products. To take blood samples often. If there is trouble placing a peripheral intravenous (PIV) catheter. If cared for properly, a PICC can remain in place for many months. Having a PICC can allow you to go home from the hospital sooner and continue treatment at home. Medicines and PICC care can be managed at home by a family member, caregiver, or home health care team. What are the risks? Generally, having a PICC is safe. However, problems may occur, including: A blood clot (thrombus) forming in or at the end of the PICC. A blood clot forming in a vein (deep vein thrombosis) or traveling to the lung (pulmonary embolism). Inflammation of the vein (phlebitis) in which the PICC is placed. Infection at the insertion site or in the blood. Blood infections from central lines, like PICCs, can be serious and often require a hospital stay. PICC malposition, or PICC movement or poor placement. A break or cut in the PICC. Do not use scissors near the PICC. Nerve or tendon irritation or injury during PICC insertion. How to care for your PICC Please follow the specific guidelines provided by your health care provider. Preventing infection You and any caregivers should wash your hands often with soap and water for at least 20 seconds. Wash hands: Before touching the PICC or the infusion device. Before changing a  bandage (dressing). Do not change the dressing unless you have been taught to do so and have shown you are able to change it safely. Flush the PICC as told. Tell your health care provider right away if the PICC is hard to flush or does not flush. Do not use force to flush the PICC. Use clean and germ-free (sterile) supplies only. Keep the supplies in a dry place. Do not reuse needles, syringes, or any other supplies. Reusing supplies can lead to infection. Keep the PICC dressing dry and secure it with tape if the edges stop sticking to your skin. Check your PICC insertion site every day for signs of infection. Check for: Redness, swelling, or pain. Fluid or blood. Warmth. Pus or a bad smell. Preventing other problems Do not use a syringe that is less than 10 mL to flush the PICC. Do not have your blood pressure checked on the arm in which the PICC is placed. Do not ever pull or tug on the PICC. Keep it secured to your arm with tape or a stretch wrap when not in use. Do not take the PICC out yourself. Only a trained health care provider should remove the PICC. Keep pets and children away from your PICC. How to care for your PICC dressing Keep your PICC dressing clean and dry to prevent infection. Do not take baths, swim, or use a hot tub until your health care provider approves. Ask your health care provider if you can take  showers. You may only be allowed to take sponge baths. When you are allowed to shower: Ask your health care provider to teach you how to wrap the PICC. Cover the PICC with clear plastic wrap and tape to keep it dry while showering. Follow instructions from your health care provider about how to take care of your insertion site and dressing. Make sure you: Wash your hands with soap and water for at least 20 seconds before and after you change your dressing. If soap and water are not available, use hand sanitizer. Change your dressing only if taught to do so by your health care  provider. Your PICC dressing needs to be changed if it becomes loose or wet. Leave stitches (sutures), skin glue, or adhesive strips in place. These skin closures may need to stay in place for 2 weeks or longer. If adhesive strip edges start to loosen and curl up, you may trim the loose edges. Do not remove adhesive strips completely unless your health care provider tells you to do that. Follow these instructions at home: Disposal of supplies Throw away any syringes in a disposal container that is meant for sharp items (sharps container). You can buy a sharps container from a pharmacy, or you can make one by using an empty, hard plastic bottle with a lid. Place any used dressings or infusion bags into a plastic bag. Throw that bag in the trash. General instructions  Always carry your PICC identification card or wear a medical alert bracelet. Keep the tube clamped at all times, unless it is being used. Always carry a smooth-edge clamp with you to clamp the PICC if it breaks. Do not use scissors or sharp objects near the tube. You may bend your arm and move it freely. If your PICC is near or at the bend of your elbow, avoid activity with repeated motion at the elbow. Avoid lifting heavy objects as told by your health care provider. Keep all follow-up visits. This is important. You will need to have your PICC dressing changed at least once a week. Contact a health care provider if: You have pain in your arm, ear, face, or teeth. You have a fever or chills. You have redness, swelling, or pain around the insertion site. You have fluid or blood coming from the insertion site. Your insertion site feels warm to the touch. You have pus or a bad smell coming from the insertion site. Your skin feels hard and raised around the insertion site. Your PICC dressing has gotten wet or is coming off and you have not been taught how to change it. Get help right away if: You have problems with your PICC, such as  your PICC: Was tugged or pulled and has partially come out. Do not  push the PICC back in. Cannot be flushed, is hard to flush, or leaks around the insertion site when it is flushed. Makes a flushing sound when it is flushed. Appears to have a hole or tear. Is accidentally pulled all the way out. If this happens, cover the insertion site with a gauze dressing. Do not throw the PICC away. Your health care provider will need to check it to be sure the entire catheter came out. You feel your heart racing or skipping beats, or you have chest pain. You have shortness of breath or trouble breathing. You have swelling, redness, warmth, or pain in the arm in which the PICC is placed. You have a red streak going up your arm that  starts under the PICC dressing. These symptoms may be an emergency. Get help right away. Call 911. Do not wait to see if the symptoms will go away. Do not drive yourself to the hospital. Summary A peripherally inserted central catheter (PICC) is a long, thin, flexible tube (catheter) that is put into a vein in the arm or leg. If cared for properly, a PICC can remain in place for many months. Having a PICC can allow you to go home from the hospital sooner and continue treatment at home. The PICC is inserted using a germ-free (sterile) technique by a specially trained health care provider. Only a trained health care provider should remove it. Do not have your blood pressure checked on the arm in which your PICC is placed. Always keep your PICC identification card with you. This information is not intended to replace advice given to you by your health care provider. Make sure you discuss any questions you have with your health care provider. Document Revised: 07/01/2021 Document Reviewed: 07/01/2021 Elsevier Patient Education  2024 [employer].

## 2024-12-02 ENCOUNTER — Other Ambulatory Visit: Payer: Self-pay | Admitting: Hematology & Oncology

## 2024-12-03 ENCOUNTER — Other Ambulatory Visit (HOSPITAL_BASED_OUTPATIENT_CLINIC_OR_DEPARTMENT_OTHER): Payer: Self-pay

## 2024-12-03 MED ORDER — SODIUM CHLORIDE FLUSH 0.9 % IV SOLN
10.0000 mL | Freq: Two times a day (BID) | INTRAVENOUS | 3 refills | Status: AC
  Filled 2024-12-03: qty 600, 30d supply, fill #0
  Filled 2024-12-31: qty 600, 30d supply, fill #1

## 2024-12-05 ENCOUNTER — Inpatient Hospital Stay

## 2024-12-05 DIAGNOSIS — C7951 Secondary malignant neoplasm of bone: Secondary | ICD-10-CM | POA: Diagnosis present

## 2024-12-05 DIAGNOSIS — C50911 Malignant neoplasm of unspecified site of right female breast: Secondary | ICD-10-CM | POA: Diagnosis present

## 2024-12-05 DIAGNOSIS — C50919 Malignant neoplasm of unspecified site of unspecified female breast: Secondary | ICD-10-CM | POA: Diagnosis not present

## 2024-12-05 DIAGNOSIS — Z1732 Human epidermal growth factor receptor 2 negative status: Secondary | ICD-10-CM | POA: Diagnosis not present

## 2024-12-05 DIAGNOSIS — Z5111 Encounter for antineoplastic chemotherapy: Secondary | ICD-10-CM | POA: Diagnosis present

## 2024-12-05 DIAGNOSIS — Z79899 Other long term (current) drug therapy: Secondary | ICD-10-CM | POA: Diagnosis not present

## 2024-12-05 DIAGNOSIS — C771 Secondary and unspecified malignant neoplasm of intrathoracic lymph nodes: Secondary | ICD-10-CM | POA: Diagnosis not present

## 2024-12-05 DIAGNOSIS — Z452 Encounter for adjustment and management of vascular access device: Secondary | ICD-10-CM | POA: Diagnosis not present

## 2024-12-05 DIAGNOSIS — Z1721 Progesterone receptor positive status: Secondary | ICD-10-CM | POA: Diagnosis not present

## 2024-12-05 DIAGNOSIS — Z170 Estrogen receptor positive status [ER+]: Secondary | ICD-10-CM | POA: Diagnosis not present

## 2024-12-05 NOTE — Patient Instructions (Signed)
 PICC Home Care Guide A peripherally inserted central catheter (PICC) is a form of IV access that allows medicines and IV fluids to be quickly put into the blood and spread throughout the body. The PICC is a long, thin, flexible tube (catheter) that is put into a vein in a person's arm or leg. The catheter ends in a large vein just outside the heart called the superior vena cava (SVC). After the PICC is put in, a chest X-ray may be done to make sure that it is in the right place. A PICC may be placed for different reasons, such as: To give medicines and liquid nutrition. To give IV fluids and blood products. To take blood samples often. If there is trouble placing a peripheral intravenous (PIV) catheter. If cared for properly, a PICC can remain in place for many months. Having a PICC can allow you to go home from the hospital sooner and continue treatment at home. Medicines and PICC care can be managed at home by a family member, caregiver, or home health care team. What are the risks? Generally, having a PICC is safe. However, problems may occur, including: A blood clot (thrombus) forming in or at the end of the PICC. A blood clot forming in a vein (deep vein thrombosis) or traveling to the lung (pulmonary embolism). Inflammation of the vein (phlebitis) in which the PICC is placed. Infection at the insertion site or in the blood. Blood infections from central lines, like PICCs, can be serious and often require a hospital stay. PICC malposition, or PICC movement or poor placement. A break or cut in the PICC. Do not use scissors near the PICC. Nerve or tendon irritation or injury during PICC insertion. How to care for your PICC Please follow the specific guidelines provided by your health care provider. Preventing infection You and any caregivers should wash your hands often with soap and water for at least 20 seconds. Wash hands: Before touching the PICC or the infusion device. Before changing a  bandage (dressing). Do not change the dressing unless you have been taught to do so and have shown you are able to change it safely. Flush the PICC as told. Tell your health care provider right away if the PICC is hard to flush or does not flush. Do not use force to flush the PICC. Use clean and germ-free (sterile) supplies only. Keep the supplies in a dry place. Do not reuse needles, syringes, or any other supplies. Reusing supplies can lead to infection. Keep the PICC dressing dry and secure it with tape if the edges stop sticking to your skin. Check your PICC insertion site every day for signs of infection. Check for: Redness, swelling, or pain. Fluid or blood. Warmth. Pus or a bad smell. Preventing other problems Do not use a syringe that is less than 10 mL to flush the PICC. Do not have your blood pressure checked on the arm in which the PICC is placed. Do not ever pull or tug on the PICC. Keep it secured to your arm with tape or a stretch wrap when not in use. Do not take the PICC out yourself. Only a trained health care provider should remove the PICC. Keep pets and children away from your PICC. How to care for your PICC dressing Keep your PICC dressing clean and dry to prevent infection. Do not take baths, swim, or use a hot tub until your health care provider approves. Ask your health care provider if you can take  showers. You may only be allowed to take sponge baths. When you are allowed to shower: Ask your health care provider to teach you how to wrap the PICC. Cover the PICC with clear plastic wrap and tape to keep it dry while showering. Follow instructions from your health care provider about how to take care of your insertion site and dressing. Make sure you: Wash your hands with soap and water for at least 20 seconds before and after you change your dressing. If soap and water are not available, use hand sanitizer. Change your dressing only if taught to do so by your health care  provider. Your PICC dressing needs to be changed if it becomes loose or wet. Leave stitches (sutures), skin glue, or adhesive strips in place. These skin closures may need to stay in place for 2 weeks or longer. If adhesive strip edges start to loosen and curl up, you may trim the loose edges. Do not remove adhesive strips completely unless your health care provider tells you to do that. Follow these instructions at home: Disposal of supplies Throw away any syringes in a disposal container that is meant for sharp items (sharps container). You can buy a sharps container from a pharmacy, or you can make one by using an empty, hard plastic bottle with a lid. Place any used dressings or infusion bags into a plastic bag. Throw that bag in the trash. General instructions  Always carry your PICC identification card or wear a medical alert bracelet. Keep the tube clamped at all times, unless it is being used. Always carry a smooth-edge clamp with you to clamp the PICC if it breaks. Do not use scissors or sharp objects near the tube. You may bend your arm and move it freely. If your PICC is near or at the bend of your elbow, avoid activity with repeated motion at the elbow. Avoid lifting heavy objects as told by your health care provider. Keep all follow-up visits. This is important. You will need to have your PICC dressing changed at least once a week. Contact a health care provider if: You have pain in your arm, ear, face, or teeth. You have a fever or chills. You have redness, swelling, or pain around the insertion site. You have fluid or blood coming from the insertion site. Your insertion site feels warm to the touch. You have pus or a bad smell coming from the insertion site. Your skin feels hard and raised around the insertion site. Your PICC dressing has gotten wet or is coming off and you have not been taught how to change it. Get help right away if: You have problems with your PICC, such as  your PICC: Was tugged or pulled and has partially come out. Do not  push the PICC back in. Cannot be flushed, is hard to flush, or leaks around the insertion site when it is flushed. Makes a flushing sound when it is flushed. Appears to have a hole or tear. Is accidentally pulled all the way out. If this happens, cover the insertion site with a gauze dressing. Do not throw the PICC away. Your health care provider will need to check it to be sure the entire catheter came out. You feel your heart racing or skipping beats, or you have chest pain. You have shortness of breath or trouble breathing. You have swelling, redness, warmth, or pain in the arm in which the PICC is placed. You have a red streak going up your arm that  starts under the PICC dressing. These symptoms may be an emergency. Get help right away. Call 911. Do not wait to see if the symptoms will go away. Do not drive yourself to the hospital. Summary A peripherally inserted central catheter (PICC) is a long, thin, flexible tube (catheter) that is put into a vein in the arm or leg. If cared for properly, a PICC can remain in place for many months. Having a PICC can allow you to go home from the hospital sooner and continue treatment at home. The PICC is inserted using a germ-free (sterile) technique by a specially trained health care provider. Only a trained health care provider should remove it. Do not have your blood pressure checked on the arm in which your PICC is placed. Always keep your PICC identification card with you. This information is not intended to replace advice given to you by your health care provider. Make sure you discuss any questions you have with your health care provider. Document Revised: 07/01/2021 Document Reviewed: 07/01/2021 Elsevier Patient Education  2024 [employer].

## 2024-12-10 ENCOUNTER — Other Ambulatory Visit: Payer: Self-pay

## 2024-12-10 NOTE — Progress Notes (Signed)
 Specialty Pharmacy Refill Coordination Note  Tracy Thompson is a 73 y.o. female contacted today regarding refills of specialty medication(s) Ribociclib  Succinate (KISQALI  400mg  Daily Dose)   Patient requested Delivery   Delivery date: 12/18/24   Verified address: [address] [place] [id number]   Medication will be filled on: 12/17/24

## 2024-12-12 ENCOUNTER — Inpatient Hospital Stay

## 2024-12-12 DIAGNOSIS — Z5111 Encounter for antineoplastic chemotherapy: Secondary | ICD-10-CM | POA: Diagnosis not present

## 2024-12-12 NOTE — Patient Instructions (Signed)
 PICC Home Care Guide A peripherally inserted central catheter (PICC) is a form of IV access that allows medicines and IV fluids to be quickly put into the blood and spread throughout the body. The PICC is a long, thin, flexible tube (catheter) that is put into a vein in a person's arm or leg. The catheter ends in a large vein just outside the heart called the superior vena cava (SVC). After the PICC is put in, a chest X-ray may be done to make sure that it is in the right place. A PICC may be placed for different reasons, such as: To give medicines and liquid nutrition. To give IV fluids and blood products. To take blood samples often. If there is trouble placing a peripheral intravenous (PIV) catheter. If cared for properly, a PICC can remain in place for many months. Having a PICC can allow you to go home from the hospital sooner and continue treatment at home. Medicines and PICC care can be managed at home by a family member, caregiver, or home health care team. What are the risks? Generally, having a PICC is safe. However, problems may occur, including: A blood clot (thrombus) forming in or at the end of the PICC. A blood clot forming in a vein (deep vein thrombosis) or traveling to the lung (pulmonary embolism). Inflammation of the vein (phlebitis) in which the PICC is placed. Infection at the insertion site or in the blood. Blood infections from central lines, like PICCs, can be serious and often require a hospital stay. PICC malposition, or PICC movement or poor placement. A break or cut in the PICC. Do not use scissors near the PICC. Nerve or tendon irritation or injury during PICC insertion. How to care for your PICC Please follow the specific guidelines provided by your health care provider. Preventing infection You and any caregivers should wash your hands often with soap and water for at least 20 seconds. Wash hands: Before touching the PICC or the infusion device. Before changing a  bandage (dressing). Do not change the dressing unless you have been taught to do so and have shown you are able to change it safely. Flush the PICC as told. Tell your health care provider right away if the PICC is hard to flush or does not flush. Do not use force to flush the PICC. Use clean and germ-free (sterile) supplies only. Keep the supplies in a dry place. Do not reuse needles, syringes, or any other supplies. Reusing supplies can lead to infection. Keep the PICC dressing dry and secure it with tape if the edges stop sticking to your skin. Check your PICC insertion site every day for signs of infection. Check for: Redness, swelling, or pain. Fluid or blood. Warmth. Pus or a bad smell. Preventing other problems Do not use a syringe that is less than 10 mL to flush the PICC. Do not have your blood pressure checked on the arm in which the PICC is placed. Do not ever pull or tug on the PICC. Keep it secured to your arm with tape or a stretch wrap when not in use. Do not take the PICC out yourself. Only a trained health care provider should remove the PICC. Keep pets and children away from your PICC. How to care for your PICC dressing Keep your PICC dressing clean and dry to prevent infection. Do not take baths, swim, or use a hot tub until your health care provider approves. Ask your health care provider if you can take  showers. You may only be allowed to take sponge baths. When you are allowed to shower: Ask your health care provider to teach you how to wrap the PICC. Cover the PICC with clear plastic wrap and tape to keep it dry while showering. Follow instructions from your health care provider about how to take care of your insertion site and dressing. Make sure you: Wash your hands with soap and water for at least 20 seconds before and after you change your dressing. If soap and water are not available, use hand sanitizer. Change your dressing only if taught to do so by your health care  provider. Your PICC dressing needs to be changed if it becomes loose or wet. Leave stitches (sutures), skin glue, or adhesive strips in place. These skin closures may need to stay in place for 2 weeks or longer. If adhesive strip edges start to loosen and curl up, you may trim the loose edges. Do not remove adhesive strips completely unless your health care provider tells you to do that. Follow these instructions at home: Disposal of supplies Throw away any syringes in a disposal container that is meant for sharp items (sharps container). You can buy a sharps container from a pharmacy, or you can make one by using an empty, hard plastic bottle with a lid. Place any used dressings or infusion bags into a plastic bag. Throw that bag in the trash. General instructions  Always carry your PICC identification card or wear a medical alert bracelet. Keep the tube clamped at all times, unless it is being used. Always carry a smooth-edge clamp with you to clamp the PICC if it breaks. Do not use scissors or sharp objects near the tube. You may bend your arm and move it freely. If your PICC is near or at the bend of your elbow, avoid activity with repeated motion at the elbow. Avoid lifting heavy objects as told by your health care provider. Keep all follow-up visits. This is important. You will need to have your PICC dressing changed at least once a week. Contact a health care provider if: You have pain in your arm, ear, face, or teeth. You have a fever or chills. You have redness, swelling, or pain around the insertion site. You have fluid or blood coming from the insertion site. Your insertion site feels warm to the touch. You have pus or a bad smell coming from the insertion site. Your skin feels hard and raised around the insertion site. Your PICC dressing has gotten wet or is coming off and you have not been taught how to change it. Get help right away if: You have problems with your PICC, such as  your PICC: Was tugged or pulled and has partially come out. Do not  push the PICC back in. Cannot be flushed, is hard to flush, or leaks around the insertion site when it is flushed. Makes a flushing sound when it is flushed. Appears to have a hole or tear. Is accidentally pulled all the way out. If this happens, cover the insertion site with a gauze dressing. Do not throw the PICC away. Your health care provider will need to check it to be sure the entire catheter came out. You feel your heart racing or skipping beats, or you have chest pain. You have shortness of breath or trouble breathing. You have swelling, redness, warmth, or pain in the arm in which the PICC is placed. You have a red streak going up your arm that  starts under the PICC dressing. These symptoms may be an emergency. Get help right away. Call 911. Do not wait to see if the symptoms will go away. Do not drive yourself to the hospital. Summary A peripherally inserted central catheter (PICC) is a long, thin, flexible tube (catheter) that is put into a vein in the arm or leg. If cared for properly, a PICC can remain in place for many months. Having a PICC can allow you to go home from the hospital sooner and continue treatment at home. The PICC is inserted using a germ-free (sterile) technique by a specially trained health care provider. Only a trained health care provider should remove it. Do not have your blood pressure checked on the arm in which your PICC is placed. Always keep your PICC identification card with you. This information is not intended to replace advice given to you by your health care provider. Make sure you discuss any questions you have with your health care provider. Document Revised: 07/01/2021 Document Reviewed: 07/01/2021 Elsevier Patient Education  2024 [employer].

## 2024-12-13 ENCOUNTER — Inpatient Hospital Stay (HOSPITAL_BASED_OUTPATIENT_CLINIC_OR_DEPARTMENT_OTHER): Admission: RE | Admit: 2024-12-13 | Discharge: 2024-12-13 | Attending: Family Medicine | Admitting: Family Medicine

## 2024-12-13 ENCOUNTER — Ambulatory Visit: Payer: Self-pay | Admitting: Family Medicine

## 2024-12-13 DIAGNOSIS — Z780 Asymptomatic menopausal state: Secondary | ICD-10-CM | POA: Diagnosis present

## 2024-12-17 ENCOUNTER — Inpatient Hospital Stay

## 2024-12-17 DIAGNOSIS — Z5111 Encounter for antineoplastic chemotherapy: Secondary | ICD-10-CM | POA: Diagnosis not present

## 2024-12-17 NOTE — Patient Instructions (Signed)
 PICC Home Care Guide A peripherally inserted central catheter (PICC) is a form of IV access that allows medicines and IV fluids to be quickly put into the blood and spread throughout the body. The PICC is a long, thin, flexible tube (catheter) that is put into a vein in a person's arm or leg. The catheter ends in a large vein just outside the heart called the superior vena cava (SVC). After the PICC is put in, a chest X-ray may be done to make sure that it is in the right place. A PICC may be placed for different reasons, such as: To give medicines and liquid nutrition. To give IV fluids and blood products. To take blood samples often. If there is trouble placing a peripheral intravenous (PIV) catheter. If cared for properly, a PICC can remain in place for many months. Having a PICC can allow you to go home from the hospital sooner and continue treatment at home. Medicines and PICC care can be managed at home by a family member, caregiver, or home health care team. What are the risks? Generally, having a PICC is safe. However, problems may occur, including: A blood clot (thrombus) forming in or at the end of the PICC. A blood clot forming in a vein (deep vein thrombosis) or traveling to the lung (pulmonary embolism). Inflammation of the vein (phlebitis) in which the PICC is placed. Infection at the insertion site or in the blood. Blood infections from central lines, like PICCs, can be serious and often require a hospital stay. PICC malposition, or PICC movement or poor placement. A break or cut in the PICC. Do not use scissors near the PICC. Nerve or tendon irritation or injury during PICC insertion. How to care for your PICC Please follow the specific guidelines provided by your health care provider. Preventing infection You and any caregivers should wash your hands often with soap and water for at least 20 seconds. Wash hands: Before touching the PICC or the infusion device. Before changing a  bandage (dressing). Do not change the dressing unless you have been taught to do so and have shown you are able to change it safely. Flush the PICC as told. Tell your health care provider right away if the PICC is hard to flush or does not flush. Do not use force to flush the PICC. Use clean and germ-free (sterile) supplies only. Keep the supplies in a dry place. Do not reuse needles, syringes, or any other supplies. Reusing supplies can lead to infection. Keep the PICC dressing dry and secure it with tape if the edges stop sticking to your skin. Check your PICC insertion site every day for signs of infection. Check for: Redness, swelling, or pain. Fluid or blood. Warmth. Pus or a bad smell. Preventing other problems Do not use a syringe that is less than 10 mL to flush the PICC. Do not have your blood pressure checked on the arm in which the PICC is placed. Do not ever pull or tug on the PICC. Keep it secured to your arm with tape or a stretch wrap when not in use. Do not take the PICC out yourself. Only a trained health care provider should remove the PICC. Keep pets and children away from your PICC. How to care for your PICC dressing Keep your PICC dressing clean and dry to prevent infection. Do not take baths, swim, or use a hot tub until your health care provider approves. Ask your health care provider if you can take  showers. You may only be allowed to take sponge baths. When you are allowed to shower: Ask your health care provider to teach you how to wrap the PICC. Cover the PICC with clear plastic wrap and tape to keep it dry while showering. Follow instructions from your health care provider about how to take care of your insertion site and dressing. Make sure you: Wash your hands with soap and water for at least 20 seconds before and after you change your dressing. If soap and water are not available, use hand sanitizer. Change your dressing only if taught to do so by your health care  provider. Your PICC dressing needs to be changed if it becomes loose or wet. Leave stitches (sutures), skin glue, or adhesive strips in place. These skin closures may need to stay in place for 2 weeks or longer. If adhesive strip edges start to loosen and curl up, you may trim the loose edges. Do not remove adhesive strips completely unless your health care provider tells you to do that. Follow these instructions at home: Disposal of supplies Throw away any syringes in a disposal container that is meant for sharp items (sharps container). You can buy a sharps container from a pharmacy, or you can make one by using an empty, hard plastic bottle with a lid. Place any used dressings or infusion bags into a plastic bag. Throw that bag in the trash. General instructions  Always carry your PICC identification card or wear a medical alert bracelet. Keep the tube clamped at all times, unless it is being used. Always carry a smooth-edge clamp with you to clamp the PICC if it breaks. Do not use scissors or sharp objects near the tube. You may bend your arm and move it freely. If your PICC is near or at the bend of your elbow, avoid activity with repeated motion at the elbow. Avoid lifting heavy objects as told by your health care provider. Keep all follow-up visits. This is important. You will need to have your PICC dressing changed at least once a week. Contact a health care provider if: You have pain in your arm, ear, face, or teeth. You have a fever or chills. You have redness, swelling, or pain around the insertion site. You have fluid or blood coming from the insertion site. Your insertion site feels warm to the touch. You have pus or a bad smell coming from the insertion site. Your skin feels hard and raised around the insertion site. Your PICC dressing has gotten wet or is coming off and you have not been taught how to change it. Get help right away if: You have problems with your PICC, such as  your PICC: Was tugged or pulled and has partially come out. Do not  push the PICC back in. Cannot be flushed, is hard to flush, or leaks around the insertion site when it is flushed. Makes a flushing sound when it is flushed. Appears to have a hole or tear. Is accidentally pulled all the way out. If this happens, cover the insertion site with a gauze dressing. Do not throw the PICC away. Your health care provider will need to check it to be sure the entire catheter came out. You feel your heart racing or skipping beats, or you have chest pain. You have shortness of breath or trouble breathing. You have swelling, redness, warmth, or pain in the arm in which the PICC is placed. You have a red streak going up your arm that  starts under the PICC dressing. These symptoms may be an emergency. Get help right away. Call 911. Do not wait to see if the symptoms will go away. Do not drive yourself to the hospital. Summary A peripherally inserted central catheter (PICC) is a long, thin, flexible tube (catheter) that is put into a vein in the arm or leg. If cared for properly, a PICC can remain in place for many months. Having a PICC can allow you to go home from the hospital sooner and continue treatment at home. The PICC is inserted using a germ-free (sterile) technique by a specially trained health care provider. Only a trained health care provider should remove it. Do not have your blood pressure checked on the arm in which your PICC is placed. Always keep your PICC identification card with you. This information is not intended to replace advice given to you by your health care provider. Make sure you discuss any questions you have with your health care provider. Document Revised: 07/01/2021 Document Reviewed: 07/01/2021 Elsevier Patient Education  2024 [employer].

## 2024-12-24 ENCOUNTER — Inpatient Hospital Stay

## 2024-12-24 ENCOUNTER — Encounter: Payer: Self-pay | Admitting: Hematology & Oncology

## 2024-12-24 ENCOUNTER — Inpatient Hospital Stay: Admitting: Hematology & Oncology

## 2024-12-24 VITALS — BP 126/82 | HR 80 | Temp 97.8°F | Resp 18 | Ht 66.0 in | Wt 157.1 lb

## 2024-12-24 DIAGNOSIS — Z5111 Encounter for antineoplastic chemotherapy: Secondary | ICD-10-CM | POA: Diagnosis not present

## 2024-12-24 DIAGNOSIS — C50919 Malignant neoplasm of unspecified site of unspecified female breast: Secondary | ICD-10-CM

## 2024-12-24 DIAGNOSIS — C7951 Secondary malignant neoplasm of bone: Secondary | ICD-10-CM

## 2024-12-24 LAB — CBC WITH DIFFERENTIAL (CANCER CENTER ONLY)
Abs Immature Granulocytes: 0.01 K/uL (ref 0.00–0.07)
Basophils Absolute: 0 K/uL (ref 0.0–0.1)
Basophils Relative: 2 %
Eosinophils Absolute: 0 K/uL (ref 0.0–0.5)
Eosinophils Relative: 0 %
HCT: 31.5 % — ABNORMAL LOW (ref 36.0–46.0)
Hemoglobin: 11.1 g/dL — ABNORMAL LOW (ref 12.0–15.0)
Immature Granulocytes: 0 %
Lymphocytes Relative: 20 %
Lymphs Abs: 0.4 K/uL — ABNORMAL LOW (ref 0.7–4.0)
MCH: 37.8 pg — ABNORMAL HIGH (ref 26.0–34.0)
MCHC: 35.2 g/dL (ref 30.0–36.0)
MCV: 107.1 fL — ABNORMAL HIGH (ref 80.0–100.0)
Monocytes Absolute: 0.5 K/uL (ref 0.1–1.0)
Monocytes Relative: 22 %
Neutro Abs: 1.2 K/uL — ABNORMAL LOW (ref 1.7–7.7)
Neutrophils Relative %: 56 %
Platelet Count: 146 K/uL — ABNORMAL LOW (ref 150–400)
RBC: 2.94 MIL/uL — ABNORMAL LOW (ref 3.87–5.11)
RDW: 12.8 % (ref 11.5–15.5)
WBC Count: 2.2 K/uL — ABNORMAL LOW (ref 4.0–10.5)
nRBC: 0 % (ref 0.0–0.2)

## 2024-12-24 LAB — CMP (CANCER CENTER ONLY)
ALT: 24 U/L (ref 0–44)
AST: 42 U/L — ABNORMAL HIGH (ref 15–41)
Albumin: 3.7 g/dL (ref 3.5–5.0)
Alkaline Phosphatase: 67 U/L (ref 38–126)
Anion gap: 12 (ref 5–15)
BUN: 16 mg/dL (ref 8–23)
CO2: 25 mmol/L (ref 22–32)
Calcium: 9 mg/dL (ref 8.9–10.3)
Chloride: 101 mmol/L (ref 98–111)
Creatinine: 1.35 mg/dL — ABNORMAL HIGH (ref 0.44–1.00)
GFR, Estimated: 42 mL/min — ABNORMAL LOW
Glucose, Bld: 103 mg/dL — ABNORMAL HIGH (ref 70–99)
Potassium: 3.6 mmol/L (ref 3.5–5.1)
Sodium: 138 mmol/L (ref 135–145)
Total Bilirubin: 0.4 mg/dL (ref 0.0–1.2)
Total Protein: 6.8 g/dL (ref 6.5–8.1)

## 2024-12-24 MED ORDER — SODIUM CHLORIDE 0.9% FLUSH: 3.0000 mL | Freq: Once | INTRAVENOUS | Status: DC | PRN

## 2024-12-24 MED ORDER — FULVESTRANT 250 MG/5ML IM SOSY
500.0000 mg | PREFILLED_SYRINGE | Freq: Once | INTRAMUSCULAR | Status: AC
  Administered 2024-12-24: 500 mg via INTRAMUSCULAR

## 2024-12-24 MED ORDER — ALTEPLASE 2 MG IJ SOLR: 2.0000 mg | Freq: Once | INTRAMUSCULAR | Status: DC | PRN

## 2024-12-24 NOTE — Patient Instructions (Signed)
 Fulvestrant Injection What is this medication? FULVESTRANT (ful VES trant) treats breast cancer. It works by blocking the hormone estrogen in breast tissue, which prevents breast cancer cells from spreading or growing. This medicine may be used for other purposes; ask your health care provider or pharmacist if you have questions. COMMON BRAND NAME(S): FASLODEX What should I tell my care team before I take this medication? They need to know if you have any of these conditions: Bleeding disorder Liver disease Low blood cell levels (white cells, red cells, and platelets) An unusual or allergic reaction to fulvestrant, other medications, foods, dyes, or preservatives Pregnant or trying to get pregnant Breastfeeding How should I use this medication? This medication is injected into a muscle. It is given by your care team in a hospital or clinic setting. Talk to your care team about the use of this medication in children. Special care may be needed. Overdosage: If you think you have taken too much of this medicine contact a poison control center or emergency room at once. NOTE: This medicine is only for you. Do not share this medicine with others. What if I miss a dose? Keep appointments for follow-up doses. It is important not to miss your dose. Call your care team if you are unable to keep an appointment. What may interact with this medication? Fluoroestradiol F18 This list may not describe all possible interactions. Give your health care provider a list of all the medicines, herbs, non-prescription drugs, or dietary supplements you use. Also tell them if you smoke, drink alcohol, or use illegal drugs. Some items may interact with your medicine. What should I watch for while using this medication? Your condition will be monitored carefully while you are receiving this medication. You may need blood work done while you are taking this medication. This medication is injected into a muscle. Talk  to your care team if you also take medications that prevent or treat blood clots, such as warfarin. Blood thinners may increase the risk of bleeding or bruising in the muscle where this medication is injected. The benefits of this medication may outweigh the risks. Your care team can help you find the option that works for you. They can also help limit the risk of bleeding. Talk to your care team if you may be pregnant. Serious birth defects can occur if you take this medication during pregnancy and for 1 year after the last dose. You will need a negative pregnancy test before starting this medication. Contraception is recommended while taking this medication and for 1 year after the last dose. Your care team can help you find the option that works for you. Do not breastfeed while taking this medication and for 1 year after the last dose. This medication may cause infertility. Talk to your care team if you are concerned about your fertility. What side effects may I notice from receiving this medication? Side effects that you should report to your care team as soon as possible: Allergic reactions or angioedema--skin rash, itching or hives, swelling of the face, eyes, lips, tongue, arms, or legs, trouble swallowing or breathing Pain, tingling, or numbness in the hands or feet Side effects that usually do not require medical attention (report to your care team if they continue or are bothersome): Bone, joint, or muscle pain Constipation Headache Hot flashes Nausea Pain, redness, or irritation at injection site Unusual weakness or fatigue This list may not describe all possible side effects. Call your doctor for medical advice about side  effects. You may report side effects to FDA at 1-800-FDA-1088. Where should I keep my medication? This medication is given in a hospital or clinic. It will not be stored at home. NOTE: This sheet is a summary. It may not cover all possible information. If you have  questions about this medicine, talk to your doctor, pharmacist, or health care provider.  2024 Elsevier/Gold Standard (2023-08-19 00:00:00)

## 2024-12-24 NOTE — Patient Instructions (Signed)
 PICC Home Care Guide A peripherally inserted central catheter (PICC) is a form of IV access that allows medicines and IV fluids to be quickly put into the blood and spread throughout the body. The PICC is a long, thin, flexible tube (catheter) that is put into a vein in a person's arm or leg. The catheter ends in a large vein just outside the heart called the superior vena cava (SVC). After the PICC is put in, a chest X-ray may be done to make sure that it is in the right place. A PICC may be placed for different reasons, such as: To give medicines and liquid nutrition. To give IV fluids and blood products. To take blood samples often. If there is trouble placing a peripheral intravenous (PIV) catheter. If cared for properly, a PICC can remain in place for many months. Having a PICC can allow you to go home from the hospital sooner and continue treatment at home. Medicines and PICC care can be managed at home by a family member, caregiver, or home health care team. What are the risks? Generally, having a PICC is safe. However, problems may occur, including: A blood clot (thrombus) forming in or at the end of the PICC. A blood clot forming in a vein (deep vein thrombosis) or traveling to the lung (pulmonary embolism). Inflammation of the vein (phlebitis) in which the PICC is placed. Infection at the insertion site or in the blood. Blood infections from central lines, like PICCs, can be serious and often require a hospital stay. PICC malposition, or PICC movement or poor placement. A break or cut in the PICC. Do not use scissors near the PICC. Nerve or tendon irritation or injury during PICC insertion. How to care for your PICC Please follow the specific guidelines provided by your health care provider. Preventing infection You and any caregivers should wash your hands often with soap and water for at least 20 seconds. Wash hands: Before touching the PICC or the infusion device. Before changing a  bandage (dressing). Do not change the dressing unless you have been taught to do so and have shown you are able to change it safely. Flush the PICC as told. Tell your health care provider right away if the PICC is hard to flush or does not flush. Do not use force to flush the PICC. Use clean and germ-free (sterile) supplies only. Keep the supplies in a dry place. Do not reuse needles, syringes, or any other supplies. Reusing supplies can lead to infection. Keep the PICC dressing dry and secure it with tape if the edges stop sticking to your skin. Check your PICC insertion site every day for signs of infection. Check for: Redness, swelling, or pain. Fluid or blood. Warmth. Pus or a bad smell. Preventing other problems Do not use a syringe that is less than 10 mL to flush the PICC. Do not have your blood pressure checked on the arm in which the PICC is placed. Do not ever pull or tug on the PICC. Keep it secured to your arm with tape or a stretch wrap when not in use. Do not take the PICC out yourself. Only a trained health care provider should remove the PICC. Keep pets and children away from your PICC. How to care for your PICC dressing Keep your PICC dressing clean and dry to prevent infection. Do not take baths, swim, or use a hot tub until your health care provider approves. Ask your health care provider if you can take  showers. You may only be allowed to take sponge baths. When you are allowed to shower: Ask your health care provider to teach you how to wrap the PICC. Cover the PICC with clear plastic wrap and tape to keep it dry while showering. Follow instructions from your health care provider about how to take care of your insertion site and dressing. Make sure you: Wash your hands with soap and water for at least 20 seconds before and after you change your dressing. If soap and water are not available, use hand sanitizer. Change your dressing only if taught to do so by your health care  provider. Your PICC dressing needs to be changed if it becomes loose or wet. Leave stitches (sutures), skin glue, or adhesive strips in place. These skin closures may need to stay in place for 2 weeks or longer. If adhesive strip edges start to loosen and curl up, you may trim the loose edges. Do not remove adhesive strips completely unless your health care provider tells you to do that. Follow these instructions at home: Disposal of supplies Throw away any syringes in a disposal container that is meant for sharp items (sharps container). You can buy a sharps container from a pharmacy, or you can make one by using an empty, hard plastic bottle with a lid. Place any used dressings or infusion bags into a plastic bag. Throw that bag in the trash. General instructions  Always carry your PICC identification card or wear a medical alert bracelet. Keep the tube clamped at all times, unless it is being used. Always carry a smooth-edge clamp with you to clamp the PICC if it breaks. Do not use scissors or sharp objects near the tube. You may bend your arm and move it freely. If your PICC is near or at the bend of your elbow, avoid activity with repeated motion at the elbow. Avoid lifting heavy objects as told by your health care provider. Keep all follow-up visits. This is important. You will need to have your PICC dressing changed at least once a week. Contact a health care provider if: You have pain in your arm, ear, face, or teeth. You have a fever or chills. You have redness, swelling, or pain around the insertion site. You have fluid or blood coming from the insertion site. Your insertion site feels warm to the touch. You have pus or a bad smell coming from the insertion site. Your skin feels hard and raised around the insertion site. Your PICC dressing has gotten wet or is coming off and you have not been taught how to change it. Get help right away if: You have problems with your PICC, such as  your PICC: Was tugged or pulled and has partially come out. Do not  push the PICC back in. Cannot be flushed, is hard to flush, or leaks around the insertion site when it is flushed. Makes a flushing sound when it is flushed. Appears to have a hole or tear. Is accidentally pulled all the way out. If this happens, cover the insertion site with a gauze dressing. Do not throw the PICC away. Your health care provider will need to check it to be sure the entire catheter came out. You feel your heart racing or skipping beats, or you have chest pain. You have shortness of breath or trouble breathing. You have swelling, redness, warmth, or pain in the arm in which the PICC is placed. You have a red streak going up your arm that  starts under the PICC dressing. These symptoms may be an emergency. Get help right away. Call 911. Do not wait to see if the symptoms will go away. Do not drive yourself to the hospital. Summary A peripherally inserted central catheter (PICC) is a long, thin, flexible tube (catheter) that is put into a vein in the arm or leg. If cared for properly, a PICC can remain in place for many months. Having a PICC can allow you to go home from the hospital sooner and continue treatment at home. The PICC is inserted using a germ-free (sterile) technique by a specially trained health care provider. Only a trained health care provider should remove it. Do not have your blood pressure checked on the arm in which your PICC is placed. Always keep your PICC identification card with you. This information is not intended to replace advice given to you by your health care provider. Make sure you discuss any questions you have with your health care provider. Document Revised: 07/01/2021 Document Reviewed: 07/01/2021 Elsevier Patient Education  2024 [employer].

## 2024-12-24 NOTE — Progress Notes (Signed)
 " Hematology and Oncology Follow Up Visit  Tracy Thompson [id number] 1951/06/09 73 y.o. 12/24/2024   Principle Diagnosis:  Metastatic breast cancer-bone metastasis/hilar lymph node metastasis- ER+/PR+/HER-2 (2+) -- PIK3CA (+)  Current Therapy:   Faslodex  500 mg IM monthly -start on 11/09/2022 Ribociclib  600 mg p.o. daily (21/7) --start on 11/09/2022 --changed to 400 mg p.o. daily started on 09/05/2023 Zometa  3 mg IV every 3 months - next dose 01/2025  Radiation therapy to left hip -completed in 11/23/2022     Interim History:  Tracy Thompson is back for follow-up.  She and her husband had a wonderful Thanksgiving and a wonderful Christmas.  For Thanksgiving, they went to [place]  to be with family.  They had a wonderful time in [place] .  She is feeling okay.  She did have a bone density test that was done.  This, really no surprise, showed that there is I think some osteoporosis.  She is doing Zometa  every 3 months.  She has had no problems with her bones.  Her last CA 27.29 was 37.  She has had no change in bowel or bladder habits.  She has had no fever.  She has had no cough or shortness of breath.  She has had no leg swelling.  There has been no leg pain.  Overall, I would say that her performance status is probably ECOG 1.       Wt Readings from Last 3 Encounters:  12/24/24 157 lb 1.3 oz (71.3 kg)  11/21/24 156 lb (70.8 kg)  10/24/24 155 lb 0.6 oz (70.3 kg)    Medications:  Current Outpatient Medications:    aspirin 81 MG tablet, Take 81 mg by mouth daily., Disp: , Rfl:    bisacodyl (DULCOLAX) 5 MG EC tablet, Take 5 mg by mouth daily as needed for moderate constipation., Disp: , Rfl:    bisoprolol -hydrochlorothiazide  (ZIAC ) 10-6.25 MG tablet, Take 1 tablet by mouth daily., Disp: 90 tablet, Rfl: 3   carboxymethylcellulose (REFRESH PLUS) 0.5 % SOLN, Place 1 drop into both eyes 3 (three) times daily as needed., Disp: , Rfl:    Cholecalciferol (VITAMIN D ) 50 MCG (2000 UT) CAPS,  Take 2,000 Units by mouth daily., Disp: , Rfl:    Cyanocobalamin  (VITAMIN B 12) 500 MCG TABS, Take 1,000 mcg by mouth daily., Disp: , Rfl:    fulvestrant  (FASLODEX ) 250 MG/5ML injection, Inject 500 mg into the muscle every 30 (thirty) days. One injection each buttock over 1-2 minutes. Warm prior to use., Disp: , Rfl:    Heparin  Na, Pork, Lock Flsh PF 100 UNIT/ML SOLN, Inject 2.5 mLs (250 Units total) into the vein every 12 (twelve) hours., Disp: 300 mL, Rfl: 3   hydrochlorothiazide  (HYDRODIURIL ) 25 MG tablet, Take 1 tablet (25 mg total) by mouth daily., Disp: 90 tablet, Rfl: 3   meclizine  (ANTIVERT ) 12.5 MG tablet, Take 1 tablet (12.5 mg total) by mouth 3 (three) times daily as needed for dizziness., Disp: 30 tablet, Rfl: 4   OLANZapine  (ZYPREXA ) 5 MG tablet, TAKE 1 TABLET(5 MG) BY MOUTH AT BEDTIME, Disp: 30 tablet, Rfl: 3   potassium chloride  SA (KLOR-CON  M) 20 MEQ tablet, Take 1 tablet (20 mEq total) by mouth 2 (two) times daily. (Patient taking differently: Take 10 mEq by mouth 2 (two) times daily.), Disp: 180 tablet, Rfl: 1   ribociclib  succ (KISQALI  400MG  DAILY DOSE) 200 MG Therapy Pack, Take 2 tablets (400 mg total) by mouth daily. Take for 21 days on, 7 days off, repeat every  28 days., Disp: 63 tablet, Rfl: 6   sodium chloride  flush 0.9 % SOLN injection, Inject 10 mLs into the vein every 12 (twelve) hours., Disp: 600 mL, Rfl: 3   traMADol  (ULTRAM ) 50 MG tablet, Take 1 tablet (50 mg total) by mouth every 6 (six) hours as needed., Disp: 90 tablet, Rfl: 0   zoledronic  acid (ZOMETA ) 4 MG/5ML injection, Inject 4 mg into the vein every 3 (three) months., Disp: , Rfl:    NONFORMULARY OR COMPOUNDED ITEM, Cbcd, cmp, tsh, lipid ---  dx htn,, hyperlipidemia, Disp: 1 each, Rfl: 0   ondansetron  (ZOFRAN ) 8 MG tablet, TAKE 1 TABLET(8 MG) BY MOUTH EVERY 8 HOURS AS NEEDED FOR NAUSEA OR VOMITING (Patient not taking: Reported on 12/24/2024), Disp: 30 tablet, Rfl: 2  Allergies: No Known Allergies  Past Medical  History, Surgical history, Social history, and Family History were reviewed and updated.  Review of Systems: Review of Systems  Constitutional:  Positive for fatigue.  HENT:  Negative.    Eyes: Negative.   Respiratory: Negative.    Cardiovascular: Negative.   Gastrointestinal:  Positive for constipation and nausea.  Endocrine: Negative.   Genitourinary: Negative.    Musculoskeletal:  Positive for arthralgias and myalgias.  Skin: Negative.   Neurological: Negative.   Hematological: Negative.   Psychiatric/Behavioral: Negative.      Physical Exam: Vitals:   12/24/24 1159  BP: 126/82  Pulse: 80  Resp: 18  Temp: 97.8 F (36.6 C)  SpO2: 98%   Wt Readings from Last 3 Encounters:  12/24/24 157 lb 1.3 oz (71.3 kg)  11/21/24 156 lb (70.8 kg)  10/24/24 155 lb 0.6 oz (70.3 kg)    Physical Exam Vitals reviewed.  HENT:     Head: Normocephalic and atraumatic.  Eyes:     Pupils: Pupils are equal, round, and reactive to light.  Cardiovascular:     Rate and Rhythm: Normal rate and regular rhythm.     Heart sounds: Normal heart sounds.  Pulmonary:     Effort: Pulmonary effort is normal.     Breath sounds: Normal breath sounds.  Abdominal:     General: Bowel sounds are normal.     Palpations: Abdomen is soft.  Musculoskeletal:        General: No tenderness or deformity. Normal range of motion.     Cervical back: Normal range of motion.  Lymphadenopathy:     Cervical: No cervical adenopathy.  Skin:    General: Skin is warm and dry.     Findings: No erythema or rash.  Neurological:     Mental Status: She is alert and oriented to person, place, and time.  Psychiatric:        Behavior: Behavior normal.        Thought Content: Thought content normal.        Judgment: Judgment normal.      Lab Results  Component Value Date   WBC 2.2 (L) 12/24/2024   HGB 11.1 (L) 12/24/2024   HCT 31.5 (L) 12/24/2024   MCV 107.1 (H) 12/24/2024   PLT 146 (L) 12/24/2024     Chemistry       Component Value Date/Time   NA 138 12/24/2024 1114   K 3.6 12/24/2024 1114   CL 101 12/24/2024 1114   CO2 25 12/24/2024 1114   BUN 16 12/24/2024 1114   CREATININE 1.35 (H) 12/24/2024 1114      Component Value Date/Time   CALCIUM  9.0 12/24/2024 1114   ALKPHOS 67 12/24/2024  1114   AST 42 (H) 12/24/2024 1114   ALT 24 12/24/2024 1114   BILITOT 0.4 12/24/2024 1114      Impression and Plan: Ms. Redel is a very nice 73 year old white female.  She has metastatic breast cancer.  We have her on antiestrogen therapy.  So far, I think she has done well with the antiestrogen therapy.   We will see what her CA 27.29 looks like.  Hopefully, this will still be holding relatively stable.  She will get her Faslodex  today.  We will plan to get her back in another month.  I think she is due for her Zometa  in February.   Maude JONELLE Crease, MD 12/29/202512:18 PM "

## 2024-12-25 ENCOUNTER — Ambulatory Visit: Payer: Self-pay | Admitting: Hematology & Oncology

## 2024-12-25 LAB — CANCER ANTIGEN 27.29: CA 27.29: 32.5 U/mL (ref 0.0–38.6)

## 2024-12-31 ENCOUNTER — Other Ambulatory Visit (HOSPITAL_BASED_OUTPATIENT_CLINIC_OR_DEPARTMENT_OTHER): Payer: Self-pay

## 2024-12-31 ENCOUNTER — Other Ambulatory Visit: Payer: Self-pay | Admitting: Hematology & Oncology

## 2024-12-31 ENCOUNTER — Encounter: Payer: Self-pay | Admitting: Hematology & Oncology

## 2024-12-31 MED ORDER — HEPARIN NA (PORK) LOCK FLSH PF 100 UNIT/ML IV SOLN
250.0000 [IU] | Freq: Two times a day (BID) | INTRAVENOUS | 3 refills | Status: AC
  Filled 2024-12-31: qty 300, 60d supply, fill #0

## 2025-01-02 ENCOUNTER — Inpatient Hospital Stay: Attending: Hematology & Oncology

## 2025-01-02 NOTE — Patient Instructions (Signed)
 PICC Home Care Guide A peripherally inserted central catheter (PICC) is a form of IV access that allows medicines and IV fluids to be quickly put into the blood and spread throughout the body. The PICC is a long, thin, flexible tube (catheter) that is put into a vein in a person's arm or leg. The catheter ends in a large vein just outside the heart called the superior vena cava (SVC). After the PICC is put in, a chest X-ray may be done to make sure that it is in the right place. A PICC may be placed for different reasons, such as: To give medicines and liquid nutrition. To give IV fluids and blood products. To take blood samples often. If there is trouble placing a peripheral intravenous (PIV) catheter. If cared for properly, a PICC can remain in place for many months. Having a PICC can allow you to go home from the hospital sooner and continue treatment at home. Medicines and PICC care can be managed at home by a family member, caregiver, or home health care team. What are the risks? Generally, having a PICC is safe. However, problems may occur, including: A blood clot (thrombus) forming in or at the end of the PICC. A blood clot forming in a vein (deep vein thrombosis) or traveling to the lung (pulmonary embolism). Inflammation of the vein (phlebitis) in which the PICC is placed. Infection at the insertion site or in the blood. Blood infections from central lines, like PICCs, can be serious and often require a hospital stay. PICC malposition, or PICC movement or poor placement. A break or cut in the PICC. Do not use scissors near the PICC. Nerve or tendon irritation or injury during PICC insertion. How to care for your PICC Please follow the specific guidelines provided by your health care provider. Preventing infection You and any caregivers should wash your hands often with soap and water for at least 20 seconds. Wash hands: Before touching the PICC or the infusion device. Before changing a  bandage (dressing). Do not change the dressing unless you have been taught to do so and have shown you are able to change it safely. Flush the PICC as told. Tell your health care provider right away if the PICC is hard to flush or does not flush. Do not use force to flush the PICC. Use clean and germ-free (sterile) supplies only. Keep the supplies in a dry place. Do not reuse needles, syringes, or any other supplies. Reusing supplies can lead to infection. Keep the PICC dressing dry and secure it with tape if the edges stop sticking to your skin. Check your PICC insertion site every day for signs of infection. Check for: Redness, swelling, or pain. Fluid or blood. Warmth. Pus or a bad smell. Preventing other problems Do not use a syringe that is less than 10 mL to flush the PICC. Do not have your blood pressure checked on the arm in which the PICC is placed. Do not ever pull or tug on the PICC. Keep it secured to your arm with tape or a stretch wrap when not in use. Do not take the PICC out yourself. Only a trained health care provider should remove the PICC. Keep pets and children away from your PICC. How to care for your PICC dressing Keep your PICC dressing clean and dry to prevent infection. Do not take baths, swim, or use a hot tub until your health care provider approves. Ask your health care provider if you can take  showers. You may only be allowed to take sponge baths. When you are allowed to shower: Ask your health care provider to teach you how to wrap the PICC. Cover the PICC with clear plastic wrap and tape to keep it dry while showering. Follow instructions from your health care provider about how to take care of your insertion site and dressing. Make sure you: Wash your hands with soap and water for at least 20 seconds before and after you change your dressing. If soap and water are not available, use hand sanitizer. Change your dressing only if taught to do so by your health care  provider. Your PICC dressing needs to be changed if it becomes loose or wet. Leave stitches (sutures), skin glue, or adhesive strips in place. These skin closures may need to stay in place for 2 weeks or longer. If adhesive strip edges start to loosen and curl up, you may trim the loose edges. Do not remove adhesive strips completely unless your health care provider tells you to do that. Follow these instructions at home: Disposal of supplies Throw away any syringes in a disposal container that is meant for sharp items (sharps container). You can buy a sharps container from a pharmacy, or you can make one by using an empty, hard plastic bottle with a lid. Place any used dressings or infusion bags into a plastic bag. Throw that bag in the trash. General instructions  Always carry your PICC identification card or wear a medical alert bracelet. Keep the tube clamped at all times, unless it is being used. Always carry a smooth-edge clamp with you to clamp the PICC if it breaks. Do not use scissors or sharp objects near the tube. You may bend your arm and move it freely. If your PICC is near or at the bend of your elbow, avoid activity with repeated motion at the elbow. Avoid lifting heavy objects as told by your health care provider. Keep all follow-up visits. This is important. You will need to have your PICC dressing changed at least once a week. Contact a health care provider if: You have pain in your arm, ear, face, or teeth. You have a fever or chills. You have redness, swelling, or pain around the insertion site. You have fluid or blood coming from the insertion site. Your insertion site feels warm to the touch. You have pus or a bad smell coming from the insertion site. Your skin feels hard and raised around the insertion site. Your PICC dressing has gotten wet or is coming off and you have not been taught how to change it. Get help right away if: You have problems with your PICC, such as  your PICC: Was tugged or pulled and has partially come out. Do not  push the PICC back in. Cannot be flushed, is hard to flush, or leaks around the insertion site when it is flushed. Makes a flushing sound when it is flushed. Appears to have a hole or tear. Is accidentally pulled all the way out. If this happens, cover the insertion site with a gauze dressing. Do not throw the PICC away. Your health care provider will need to check it to be sure the entire catheter came out. You feel your heart racing or skipping beats, or you have chest pain. You have shortness of breath or trouble breathing. You have swelling, redness, warmth, or pain in the arm in which the PICC is placed. You have a red streak going up your arm that  starts under the PICC dressing. These symptoms may be an emergency. Get help right away. Call 911. Do not wait to see if the symptoms will go away. Do not drive yourself to the hospital. Summary A peripherally inserted central catheter (PICC) is a long, thin, flexible tube (catheter) that is put into a vein in the arm or leg. If cared for properly, a PICC can remain in place for many months. Having a PICC can allow you to go home from the hospital sooner and continue treatment at home. The PICC is inserted using a germ-free (sterile) technique by a specially trained health care provider. Only a trained health care provider should remove it. Do not have your blood pressure checked on the arm in which your PICC is placed. Always keep your PICC identification card with you. This information is not intended to replace advice given to you by your health care provider. Make sure you discuss any questions you have with your health care provider. Document Revised: 07/01/2021 Document Reviewed: 07/01/2021 Elsevier Patient Education  2024 [employer].

## 2025-01-07 ENCOUNTER — Other Ambulatory Visit: Payer: Self-pay

## 2025-01-07 NOTE — Progress Notes (Signed)
 Specialty Pharmacy Refill Coordination Note  Tracy Thompson is a 74 y.o. female contacted today regarding refills of specialty medication(s) Ribociclib  Succinate (KISQALI  400mg  Daily Dose)   Patient requested Delivery   Delivery date: 01/15/25   Verified address: [address] [place] [id number]   Medication will be filled on: 01/14/25   Patient is aware of the $100.00 copay

## 2025-01-09 ENCOUNTER — Inpatient Hospital Stay

## 2025-01-09 NOTE — Patient Instructions (Signed)

## 2025-01-16 ENCOUNTER — Inpatient Hospital Stay

## 2025-01-16 NOTE — Patient Instructions (Signed)

## 2025-01-23 ENCOUNTER — Inpatient Hospital Stay: Admitting: Hematology & Oncology

## 2025-01-23 ENCOUNTER — Inpatient Hospital Stay

## 2025-01-23 ENCOUNTER — Encounter: Payer: Self-pay | Admitting: Hematology & Oncology

## 2025-01-23 VITALS — BP 108/70 | HR 81 | Temp 97.9°F | Resp 18 | Ht 66.0 in | Wt 159.1 lb

## 2025-01-23 DIAGNOSIS — C50919 Malignant neoplasm of unspecified site of unspecified female breast: Secondary | ICD-10-CM

## 2025-01-23 DIAGNOSIS — C7951 Secondary malignant neoplasm of bone: Secondary | ICD-10-CM | POA: Diagnosis not present

## 2025-01-23 LAB — CBC WITH DIFFERENTIAL (CANCER CENTER ONLY)
Abs Immature Granulocytes: 0.01 10*3/uL (ref 0.00–0.07)
Basophils Absolute: 0 10*3/uL (ref 0.0–0.1)
Basophils Relative: 1 %
Eosinophils Absolute: 0 10*3/uL (ref 0.0–0.5)
Eosinophils Relative: 1 %
HCT: 31.5 % — ABNORMAL LOW (ref 36.0–46.0)
Hemoglobin: 10.9 g/dL — ABNORMAL LOW (ref 12.0–15.0)
Immature Granulocytes: 1 %
Lymphocytes Relative: 19 %
Lymphs Abs: 0.4 10*3/uL — ABNORMAL LOW (ref 0.7–4.0)
MCH: 37.7 pg — ABNORMAL HIGH (ref 26.0–34.0)
MCHC: 34.6 g/dL (ref 30.0–36.0)
MCV: 109 fL — ABNORMAL HIGH (ref 80.0–100.0)
Monocytes Absolute: 0.4 10*3/uL (ref 0.1–1.0)
Monocytes Relative: 22 %
Neutro Abs: 1.1 10*3/uL — ABNORMAL LOW (ref 1.7–7.7)
Neutrophils Relative %: 56 %
Platelet Count: 163 10*3/uL (ref 150–400)
RBC: 2.89 MIL/uL — ABNORMAL LOW (ref 3.87–5.11)
RDW: 13.2 % (ref 11.5–15.5)
WBC Count: 1.9 10*3/uL — ABNORMAL LOW (ref 4.0–10.5)
nRBC: 0 % (ref 0.0–0.2)

## 2025-01-23 LAB — CMP (CANCER CENTER ONLY)
ALT: 22 U/L (ref 0–44)
AST: 34 U/L (ref 15–41)
Albumin: 3.7 g/dL (ref 3.5–5.0)
Alkaline Phosphatase: 67 U/L (ref 38–126)
Anion gap: 12 (ref 5–15)
BUN: 16 mg/dL (ref 8–23)
CO2: 27 mmol/L (ref 22–32)
Calcium: 9.3 mg/dL (ref 8.9–10.3)
Chloride: 101 mmol/L (ref 98–111)
Creatinine: 1.39 mg/dL — ABNORMAL HIGH (ref 0.44–1.00)
GFR, Estimated: 40 mL/min — ABNORMAL LOW
Glucose, Bld: 103 mg/dL — ABNORMAL HIGH (ref 70–99)
Potassium: 3.7 mmol/L (ref 3.5–5.1)
Sodium: 140 mmol/L (ref 135–145)
Total Bilirubin: 0.3 mg/dL (ref 0.0–1.2)
Total Protein: 6.5 g/dL (ref 6.5–8.1)

## 2025-01-23 MED ORDER — FULVESTRANT 250 MG/5ML IM SOSY
500.0000 mg | PREFILLED_SYRINGE | Freq: Once | INTRAMUSCULAR | Status: AC
  Administered 2025-01-23: 500 mg via INTRAMUSCULAR
  Filled 2025-01-23: qty 10

## 2025-01-23 MED ORDER — ALTEPLASE 2 MG IJ SOLR
2.0000 mg | Freq: Once | INTRAMUSCULAR | Status: AC | PRN
  Administered 2025-01-23: 2 mg
  Filled 2025-01-23: qty 2

## 2025-01-23 NOTE — Progress Notes (Signed)
 [facility]  noted to have no blood return. Site does flush well with NS. No pain or swelling noted when flushed.

## 2025-01-23 NOTE — Progress Notes (Signed)
 " Hematology and Oncology Follow Up Visit  Tracy Thompson [id number] 1951-10-23 74 y.o. 01/23/2025   Principle Diagnosis:  Metastatic breast cancer-bone metastasis/hilar lymph node metastasis- ER+/PR+/HER-2 (2+) -- PIK3CA (+)  Current Therapy:   Faslodex  500 mg IM monthly -start on 11/09/2022 Ribociclib  600 mg p.o. daily (21/7) --start on 11/09/2022 --changed to 400 mg p.o. daily started on 09/05/2023 Zometa  3 mg IV every 3 months - next dose 01/2025  Radiation therapy to left hip -completed in 11/23/2022     Interim History:  Tracy Thompson is back for follow-up.  She comes in with her husband.  They are a very nice and quiet New Years Day.  They enjoyed this.  She has been feeling well.  He has chronic fatigue syndrome.  He has good days and then he has days that are not as good.  Her last CA 27.29 was 32.  Very happy about this.  She has had no problems with the ribociclib .  She has had no fever.  There has been no nausea or vomiting.  She has had no diarrhea.  There is been no rashes.  She has had no leg swelling.  Overall, I think her quality life has been doing pretty well.  Currently, I would say that her performance status is probably ECOG 1.      Wt Readings from Last 3 Encounters:  01/23/25 159 lb 1.9 oz (72.2 kg)  12/24/24 157 lb 1.3 oz (71.3 kg)  11/21/24 156 lb (70.8 kg)    Medications:  Current Outpatient Medications:    aspirin 81 MG tablet, Take 81 mg by mouth daily., Disp: , Rfl:    bisacodyl (DULCOLAX) 5 MG EC tablet, Take 5 mg by mouth daily as needed for moderate constipation., Disp: , Rfl:    bisoprolol -hydrochlorothiazide  (ZIAC ) 10-6.25 MG tablet, Take 1 tablet by mouth daily., Disp: 90 tablet, Rfl: 3   carboxymethylcellulose (REFRESH PLUS) 0.5 % SOLN, Place 1 drop into both eyes 3 (three) times daily as needed., Disp: , Rfl:    Cholecalciferol (VITAMIN D ) 50 MCG (2000 UT) CAPS, Take 2,000 Units by mouth daily., Disp: , Rfl:    Cyanocobalamin  (VITAMIN B 12) 500  MCG TABS, Take 1,000 mcg by mouth daily., Disp: , Rfl:    fulvestrant  (FASLODEX ) 250 MG/5ML injection, Inject 500 mg into the muscle every 30 (thirty) days. One injection each buttock over 1-2 minutes. Warm prior to use., Disp: , Rfl:    Heparin  Na, Pork, Lock Flsh PF 100 UNIT/ML SOLN, Inject 2.5 mLs (250 Units total) into the vein every 12 (twelve) hours., Disp: 300 mL, Rfl: 3   hydrochlorothiazide  (HYDRODIURIL ) 25 MG tablet, Take 1 tablet (25 mg total) by mouth daily., Disp: 90 tablet, Rfl: 3   meclizine  (ANTIVERT ) 12.5 MG tablet, Take 1 tablet (12.5 mg total) by mouth 3 (three) times daily as needed for dizziness., Disp: 30 tablet, Rfl: 4   OLANZapine  (ZYPREXA ) 5 MG tablet, TAKE 1 TABLET(5 MG) BY MOUTH AT BEDTIME, Disp: 30 tablet, Rfl: 3   potassium chloride  SA (KLOR-CON  M) 20 MEQ tablet, Take 1 tablet (20 mEq total) by mouth 2 (two) times daily. (Patient taking differently: Take 10 mEq by mouth 2 (two) times daily.), Disp: 180 tablet, Rfl: 1   ribociclib  succ (KISQALI  400MG  DAILY DOSE) 200 MG Therapy Pack, Take 2 tablets (400 mg total) by mouth daily. Take for 21 days on, 7 days off, repeat every 28 days., Disp: 63 tablet, Rfl: 6   sodium chloride  flush  0.9 % SOLN injection, Inject 10 mLs into the vein every 12 (twelve) hours., Disp: 600 mL, Rfl: 3   traMADol  (ULTRAM ) 50 MG tablet, Take 1 tablet (50 mg total) by mouth every 6 (six) hours as needed., Disp: 90 tablet, Rfl: 0   zoledronic  acid (ZOMETA ) 4 MG/5ML injection, Inject 4 mg into the vein every 3 (three) months., Disp: , Rfl:    ondansetron  (ZOFRAN ) 8 MG tablet, TAKE 1 TABLET(8 MG) BY MOUTH EVERY 8 HOURS AS NEEDED FOR NAUSEA OR VOMITING (Patient not taking: Reported on 01/23/2025), Disp: 30 tablet, Rfl: 2  Allergies: No Known Allergies  Past Medical History, Surgical history, Social history, and Family History were reviewed and updated.  Review of Systems: Review of Systems  Constitutional:  Positive for fatigue.  HENT:  Negative.     Eyes: Negative.   Respiratory: Negative.    Cardiovascular: Negative.   Gastrointestinal:  Positive for constipation and nausea.  Endocrine: Negative.   Genitourinary: Negative.    Musculoskeletal:  Positive for arthralgias and myalgias.  Skin: Negative.   Neurological: Negative.   Hematological: Negative.   Psychiatric/Behavioral: Negative.      Physical Exam: Vitals:   01/23/25 1220  BP: 108/70  Pulse: 81  Resp: 18  Temp: 97.9 F (36.6 C)  SpO2: 99%   Wt Readings from Last 3 Encounters:  01/23/25 159 lb 1.9 oz (72.2 kg)  12/24/24 157 lb 1.3 oz (71.3 kg)  11/21/24 156 lb (70.8 kg)    Physical Exam Vitals reviewed.  HENT:     Head: Normocephalic and atraumatic.  Eyes:     Pupils: Pupils are equal, round, and reactive to light.  Cardiovascular:     Rate and Rhythm: Normal rate and regular rhythm.     Heart sounds: Normal heart sounds.  Pulmonary:     Effort: Pulmonary effort is normal.     Breath sounds: Normal breath sounds.  Abdominal:     General: Bowel sounds are normal.     Palpations: Abdomen is soft.  Musculoskeletal:        General: No tenderness or deformity. Normal range of motion.     Cervical back: Normal range of motion.  Lymphadenopathy:     Cervical: No cervical adenopathy.  Skin:    General: Skin is warm and dry.     Findings: No erythema or rash.  Neurological:     Mental Status: She is alert and oriented to person, place, and time.  Psychiatric:        Behavior: Behavior normal.        Thought Content: Thought content normal.        Judgment: Judgment normal.      Lab Results  Component Value Date   WBC 1.9 (L) 01/23/2025   HGB 10.9 (L) 01/23/2025   HCT 31.5 (L) 01/23/2025   MCV 109.0 (H) 01/23/2025   PLT 163 01/23/2025     Chemistry      Component Value Date/Time   NA 138 12/24/2024 1114   K 3.6 12/24/2024 1114   CL 101 12/24/2024 1114   CO2 25 12/24/2024 1114   BUN 16 12/24/2024 1114   CREATININE 1.35 (H) 12/24/2024  1114      Component Value Date/Time   CALCIUM  9.0 12/24/2024 1114   ALKPHOS 67 12/24/2024 1114   AST 42 (H) 12/24/2024 1114   ALT 24 12/24/2024 1114   BILITOT 0.4 12/24/2024 1114      Impression and Plan: Tracy Thompson is a very  nice 74 year old white female.  She has metastatic breast cancer.  We have her on antiestrogen therapy.  So far, I think she has done well with the antiestrogen therapy.   We will see what her CA 27.29 looks like.  Hopefully, this will still be holding relatively stable.  She will be due for a PET scan in February.  We will set 1 up for her.  She gets her Faslodex  today.  When she comes back in February, we will do the Zometa .   Tracy JONELLE Crease, MD 1/28/202612:27 PM "

## 2025-01-23 NOTE — Progress Notes (Signed)
 Power Line site accessed. Site flushes well with no swelling or pain noted or stated  on flushing. Cath-Flo instilled per policy. Site marked.

## 2025-01-23 NOTE — Patient Instructions (Signed)
 Fulvestrant Injection What is this medication? FULVESTRANT (ful VES trant) treats breast cancer. It works by blocking the hormone estrogen in breast tissue, which prevents breast cancer cells from spreading or growing. This medicine may be used for other purposes; ask your health care provider or pharmacist if you have questions. COMMON BRAND NAME(S): FASLODEX What should I tell my care team before I take this medication? They need to know if you have any of these conditions: Bleeding disorder Liver disease Low blood cell levels (white cells, red cells, and platelets) An unusual or allergic reaction to fulvestrant, other medications, foods, dyes, or preservatives Pregnant or trying to get pregnant Breastfeeding How should I use this medication? This medication is injected into a muscle. It is given by your care team in a hospital or clinic setting. Talk to your care team about the use of this medication in children. Special care may be needed. Overdosage: If you think you have taken too much of this medicine contact a poison control center or emergency room at once. NOTE: This medicine is only for you. Do not share this medicine with others. What if I miss a dose? Keep appointments for follow-up doses. It is important not to miss your dose. Call your care team if you are unable to keep an appointment. What may interact with this medication? Fluoroestradiol F18 This list may not describe all possible interactions. Give your health care provider a list of all the medicines, herbs, non-prescription drugs, or dietary supplements you use. Also tell them if you smoke, drink alcohol, or use illegal drugs. Some items may interact with your medicine. What should I watch for while using this medication? Your condition will be monitored carefully while you are receiving this medication. You may need blood work done while you are taking this medication. This medication is injected into a muscle. Talk  to your care team if you also take medications that prevent or treat blood clots, such as warfarin. Blood thinners may increase the risk of bleeding or bruising in the muscle where this medication is injected. The benefits of this medication may outweigh the risks. Your care team can help you find the option that works for you. They can also help limit the risk of bleeding. Talk to your care team if you may be pregnant. Serious birth defects can occur if you take this medication during pregnancy and for 1 year after the last dose. You will need a negative pregnancy test before starting this medication. Contraception is recommended while taking this medication and for 1 year after the last dose. Your care team can help you find the option that works for you. Do not breastfeed while taking this medication and for 1 year after the last dose. This medication may cause infertility. Talk to your care team if you are concerned about your fertility. What side effects may I notice from receiving this medication? Side effects that you should report to your care team as soon as possible: Allergic reactions or angioedema--skin rash, itching or hives, swelling of the face, eyes, lips, tongue, arms, or legs, trouble swallowing or breathing Pain, tingling, or numbness in the hands or feet Side effects that usually do not require medical attention (report to your care team if they continue or are bothersome): Bone, joint, or muscle pain Constipation Headache Hot flashes Nausea Pain, redness, or irritation at injection site Unusual weakness or fatigue This list may not describe all possible side effects. Call your doctor for medical advice about side  effects. You may report side effects to FDA at 1-800-FDA-1088. Where should I keep my medication? This medication is given in a hospital or clinic. It will not be stored at home. NOTE: This sheet is a summary. It may not cover all possible information. If you have  questions about this medicine, talk to your doctor, pharmacist, or health care provider.  2024 Elsevier/Gold Standard (2023-08-19 00:00:00)

## 2025-01-24 ENCOUNTER — Ambulatory Visit: Payer: Self-pay | Admitting: Hematology & Oncology

## 2025-01-24 LAB — CANCER ANTIGEN 27.29: CA 27.29: 31.7 U/mL (ref 0.0–38.6)

## 2025-01-30 ENCOUNTER — Inpatient Hospital Stay: Attending: Hematology & Oncology

## 2025-02-06 ENCOUNTER — Inpatient Hospital Stay

## 2025-02-07 ENCOUNTER — Encounter (HOSPITAL_COMMUNITY)

## 2025-02-13 ENCOUNTER — Inpatient Hospital Stay

## 2025-02-20 ENCOUNTER — Inpatient Hospital Stay

## 2025-02-20 ENCOUNTER — Inpatient Hospital Stay: Admitting: Hematology & Oncology

## 2025-02-27 ENCOUNTER — Inpatient Hospital Stay: Attending: Hematology & Oncology

## 2025-03-06 ENCOUNTER — Inpatient Hospital Stay

## 2025-03-13 ENCOUNTER — Inpatient Hospital Stay

## 2025-03-20 ENCOUNTER — Inpatient Hospital Stay

## 2025-03-20 ENCOUNTER — Inpatient Hospital Stay: Admitting: Hematology & Oncology

## 2025-10-11 ENCOUNTER — Encounter: Admitting: Family Medicine

## 2025-10-24 ENCOUNTER — Ambulatory Visit
